# Patient Record
Sex: Male | Born: 1965 | Race: White | Hispanic: No | Smoking: Current some day smoker
Health system: Southern US, Community
[De-identification: ages and names within clinical notes are randomized; demographics above are authoritative.]

## PROBLEM LIST (undated history)

## (undated) DIAGNOSIS — T8859XA Other complications of anesthesia, initial encounter: Secondary | ICD-10-CM

## (undated) DIAGNOSIS — I672 Cerebral atherosclerosis: Secondary | ICD-10-CM

## (undated) DIAGNOSIS — R7303 Prediabetes: Secondary | ICD-10-CM

## (undated) DIAGNOSIS — I10 Essential (primary) hypertension: Secondary | ICD-10-CM

## (undated) DIAGNOSIS — I700 Atherosclerosis of aorta: Secondary | ICD-10-CM

## (undated) DIAGNOSIS — E785 Hyperlipidemia, unspecified: Secondary | ICD-10-CM

## (undated) DIAGNOSIS — F1010 Alcohol abuse, uncomplicated: Secondary | ICD-10-CM

## (undated) DIAGNOSIS — C801 Malignant (primary) neoplasm, unspecified: Secondary | ICD-10-CM

## (undated) DIAGNOSIS — I639 Cerebral infarction, unspecified: Secondary | ICD-10-CM

## (undated) DIAGNOSIS — Z8679 Personal history of other diseases of the circulatory system: Secondary | ICD-10-CM

## (undated) DIAGNOSIS — F419 Anxiety disorder, unspecified: Secondary | ICD-10-CM

## (undated) DIAGNOSIS — R0600 Dyspnea, unspecified: Secondary | ICD-10-CM

## (undated) HISTORY — DX: Malignant (primary) neoplasm, unspecified: C80.1

## (undated) HISTORY — PX: TONSILLECTOMY: SUR1361

---

## 2006-01-26 ENCOUNTER — Emergency Department: Payer: Self-pay | Admitting: General Practice

## 2014-11-03 ENCOUNTER — Emergency Department
Admission: EM | Admit: 2014-11-03 | Discharge: 2014-11-03 | Disposition: A | Discharge status: Home / Self Care | Payer: 59 | Attending: Student | Admitting: Student

## 2014-11-03 DIAGNOSIS — F10239 Alcohol dependence with withdrawal, unspecified: Secondary | ICD-10-CM

## 2014-11-03 DIAGNOSIS — Z79899 Other long term (current) drug therapy: Secondary | ICD-10-CM | POA: Insufficient documentation

## 2014-11-03 DIAGNOSIS — F10939 Alcohol use, unspecified with withdrawal, unspecified: Secondary | ICD-10-CM

## 2014-11-03 DIAGNOSIS — Z720 Tobacco use: Secondary | ICD-10-CM | POA: Insufficient documentation

## 2014-11-03 DIAGNOSIS — R112 Nausea with vomiting, unspecified: Secondary | ICD-10-CM | POA: Diagnosis not present

## 2014-11-03 DIAGNOSIS — I159 Secondary hypertension, unspecified: Secondary | ICD-10-CM | POA: Diagnosis not present

## 2014-11-03 DIAGNOSIS — I10 Essential (primary) hypertension: Secondary | ICD-10-CM | POA: Diagnosis present

## 2014-11-03 LAB — COMPREHENSIVE METABOLIC PANEL
ALK PHOS: 67 U/L (ref 38–126)
ALT: 18 U/L (ref 17–63)
ANION GAP: 9 (ref 5–15)
AST: 23 U/L (ref 15–41)
Albumin: 4.4 g/dL (ref 3.5–5.0)
BUN: 6 mg/dL (ref 6–20)
CO2: 25 mmol/L (ref 22–32)
Calcium: 9.4 mg/dL (ref 8.9–10.3)
Chloride: 100 mmol/L — ABNORMAL LOW (ref 101–111)
Creatinine, Ser: 0.73 mg/dL (ref 0.61–1.24)
Glucose, Bld: 158 mg/dL — ABNORMAL HIGH (ref 65–99)
Potassium: 4 mmol/L (ref 3.5–5.1)
Sodium: 134 mmol/L — ABNORMAL LOW (ref 135–145)
TOTAL PROTEIN: 7.3 g/dL (ref 6.5–8.1)
Total Bilirubin: 0.6 mg/dL (ref 0.3–1.2)

## 2014-11-03 LAB — CBC WITH DIFFERENTIAL/PLATELET
BASOS ABS: 0 10*3/uL (ref 0–0.1)
BASOS PCT: 0 %
EOS PCT: 0 %
Eosinophils Absolute: 0 10*3/uL (ref 0–0.7)
HEMATOCRIT: 46.6 % (ref 40.0–52.0)
Hemoglobin: 15.9 g/dL (ref 13.0–18.0)
Lymphocytes Relative: 11 %
Lymphs Abs: 1.2 10*3/uL (ref 1.0–3.6)
MCH: 30.6 pg (ref 26.0–34.0)
MCHC: 34.2 g/dL (ref 32.0–36.0)
MCV: 89.6 fL (ref 80.0–100.0)
MONO ABS: 0.7 10*3/uL (ref 0.2–1.0)
Monocytes Relative: 6 %
NEUTROS PCT: 83 %
Neutro Abs: 9.5 10*3/uL — ABNORMAL HIGH (ref 1.4–6.5)
Platelets: 304 10*3/uL (ref 150–440)
RBC: 5.19 MIL/uL (ref 4.40–5.90)
RDW: 13.1 % (ref 11.5–14.5)
WBC: 11.5 10*3/uL — ABNORMAL HIGH (ref 3.8–10.6)

## 2014-11-03 LAB — LIPASE, BLOOD: Lipase: 24 U/L (ref 22–51)

## 2014-11-03 LAB — ETHANOL

## 2014-11-03 MED ORDER — SODIUM CHLORIDE 0.9 % IV BOLUS (SEPSIS)
1000.0000 mL | Freq: Once | INTRAVENOUS | Status: AC
  Administered 2014-11-03: 1000 mL via INTRAVENOUS

## 2014-11-03 MED ORDER — CHLORDIAZEPOXIDE HCL 25 MG PO CAPS: 50.0000 mg | ORAL_CAPSULE | Freq: Four times a day (QID) | ORAL | Status: DC

## 2014-11-03 MED ORDER — LORAZEPAM 2 MG/ML IJ SOLN
1.0000 mg | Freq: Once | INTRAMUSCULAR | Status: AC
  Administered 2014-11-03: 1 mg via INTRAVENOUS
  Filled 2014-11-03: qty 1

## 2014-11-03 MED ORDER — LORAZEPAM 2 MG/ML IJ SOLN: 1.0000 mg | Freq: Once | INTRAMUSCULAR | Status: DC

## 2014-11-03 NOTE — ED Provider Notes (Signed)
[facility] Emergency Department Provider Note  ____________________________________________  Time seen: Approximately 10:49 AM  I have reviewed the triage vital signs and the nursing notes.   HISTORY  Chief Complaint Hypertension and Delirium Tremens (DTS)    HPI Henry Boone is a 49 y.o. male with history of alcoholism presents [facility], unknown onset. The patient reports that he typically drinks alcohol heavily, at minimum drinking 3 alcoholic beverages daily "for most of my adult life". Patient reports that 4 days ago, he did not feel well. He began having some vomiting. This lasted for 2 days and has since resolved. He took this as an opportunity to stop drinking alcohol completely. His last alcoholic drink was 4 days ago. He reports that he is no longer vomiting, he is not hallucinating but he does feel "off". He reports that he feels slightly "off" and "foggy" and he is unable to concentrate. Last night he reports he "put a can of dog food in the refrigerator". No diarrhea, fevers, chills, chest pain or difficulty breathing. He specifically denies any headache. Current severity of symptoms is moderate. No modifying factors. Denies history of seizures/severe withdrawal symptoms in the past.   History reviewed. No pertinent past medical history.  There are no active problems to display for this patient.   History reviewed. No pertinent past surgical history.  Current Outpatient Rx  Name  Route  Sig  Dispense  Refill  . chlordiazePOXIDE (LIBRIUM) 25 MG capsule   Oral   Take 2 capsules (50 mg total) by mouth 4 (four) times daily. Take 50 mg PO every 6 hours on day 1, 50 mg PO  Every 8 hours on day 2, 50 mg PO every 12 hours on day 3, 50 mg once on day 4   30 capsule   0     Allergies Review of patient's allergies indicates no known allergies.  No family history on file.  Social History History   Substance Use Topics  . Smoking status: Current Every Day Smoker  . Smokeless tobacco: Not on file  . Alcohol Use: Yes    Review of Systems Constitutional: No fever/chills Eyes: No visual changes. ENT: No sore throat. Cardiovascular: Denies chest pain. Respiratory: Denies shortness of breath. Gastrointestinal: No abdominal pain.  + nausea, + vomiting.  No diarrhea.  No constipation. Genitourinary: Negative for dysuria. Musculoskeletal: Negative for back pain. Skin: Negative for rash. Neurological: Negative for headaches, focal weakness or numbness.  10-point ROS otherwise negative.  ____________________________________________   PHYSICAL EXAM:  VITAL SIGNS: ED Triage Vitals  Enc Vitals Group     BP 11/03/14 1018 174/103 mmHg     Pulse Rate 11/03/14 1018 112     Resp 11/03/14 1018 18     Temp 11/03/14 1018 98.3 F (36.8 C)     Temp Source 11/03/14 1018 Oral     SpO2 11/03/14 1018 93 %     Weight 11/03/14 1018 170 lb (77.111 kg)     Height 11/03/14 1018 6' (1.829 m)     Head Cir --      Peak Flow --      Pain Score --      Pain Loc --      Pain Edu? --      Excl. in [place]? --     Constitutional: Alert and oriented. Mildy anxious-appearing but otherwise in no acute distress. Eyes: Conjunctivae are normal. PERRL. EOMI. Head: Atraumatic. Nose: No  congestion/rhinnorhea. Mouth/Throat: Mucous membranes are moist.  Oropharynx non-erythematous. Neck: No stridor.   Cardiovascular: tachycardic rate, regular rhythm. Grossly normal heart sounds.  Good peripheral circulation. Respiratory: Normal respiratory effort.  No retractions. Lungs CTAB. Gastrointestinal: Soft and nontender. No distention. No abdominal bruits. No CVA tenderness. Genitourinary: deferred Musculoskeletal: No lower extremity tenderness nor edema.  No joint effusions. Neurologic:  Normal speech and language. No gross focal neurologic deficits are appreciated. No gait instability. Skin:  Skin is warm, dry and  intact. No rash noted. Psychiatric: Mood and affect are normal. Speech and behavior are normal.  ____________________________________________   LABS (all labs ordered are listed, but only abnormal results are displayed)  Labs Reviewed  CBC WITH DIFFERENTIAL/PLATELET - Abnormal; Notable for the following:    WBC 11.5 (*)    Neutro Abs 9.5 (*)    All other components within normal limits  COMPREHENSIVE METABOLIC PANEL - Abnormal; Notable for the following:    Sodium 134 (*)    Chloride 100 (*)    Glucose, Bld 158 (*)    All other components within normal limits  LIPASE, BLOOD  ETHANOL   ____________________________________________  EKG  ED ECG REPORT I, Joanne Gavel, the attending physician, personally viewed and interpreted this ECG.   Date: 11/03/2014  EKG Time: 10:20  Rate: 117  Rhythm: sinus tachycardia  Axis: right  Intervals:none  ST&T Change: No acute ST segment elevation  ____________________________________________  RADIOLOGY  none ____________________________________________   PROCEDURES  Procedure(s) performed: None  Critical Care performed: No  ____________________________________________   INITIAL IMPRESSION / ASSESSMENT AND PLAN / ED COURSE  Pertinent labs & imaging results that were available during my care of the patient were reviewed by me and considered in my medical decision making (see chart for details).  Henry Boone is a 49 y.o. male with history of alcoholism presents [facility], unknown onset. On exam, he appears mildly anxious, he is mildly tachycardic but his exam is otherwise unremarkable. He is hypertensive to 174/103. He denies any chest pain, difficulty breathing, headache. He has an intact neurological examination. He reports that 4 days ago he developed vomiting however has not had any vomiting over the past 2-3 days. Suspect this might have been viral illness given that his vomiting  has resolved and he has no abdominal tenderness. Additionally, I suspect he is suffering from symptoms of alcohol withdrawal given his tachycardia, hypertension. I have offered behavioral health consult and  referral to [facility] for outpatient treatment however he reports that he is not interested in this, would like to be managed at home if possible. He is alert and oriented, denies any SI/HI/visual hallucinations. Plan for basic labs, treatment with Ativan, normal saline. Reassess for disposition.  ----------------------------------------- 1:20 PM on 11/03/2014 -----------------------------------------  Patient with improvement tachycardia. I reassessed him, he was reports he feels much better after Ativan. Current CIWA score is 1. Blood pressure has improved to 142/84. Will discharge with Librium taper. We discussed need for establishing care immediately with PCP doctor and he is comfortable with the discharge plan. We discussed meticulous return precautions and he has voiced understanding of them. At this point, he is nearly four-days sober and appears well. ____________________________________________   FINAL CLINICAL IMPRESSION(S) / ED DIAGNOSES  Final diagnoses:  Non-intractable vomiting with nausea, vomiting of unspecified type  Secondary hypertension, unspecified  Alcohol withdrawal, with unspecified complication      Joanne Gavel, MD 11/03/14 1323

## 2014-11-03 NOTE — ED Notes (Signed)
Pt sent here from [place] for elevated BP. Pt went to [facility] because he was feeling nauseated/vomiting on Saturday. Pt states that he decided to stop drinking on Saturday and has not drank anything since. Pt was told her may be going into alcohol withdrawal. Pt usually drinks 3 beer/wine daily. Pt c/o headache. No tremors at this time. Pt states that he does feel "foggy"

## 2014-11-11 ENCOUNTER — Emergency Department: Payer: 59

## 2014-11-11 ENCOUNTER — Encounter: Payer: Self-pay | Admitting: *Deleted

## 2014-11-11 ENCOUNTER — Inpatient Hospital Stay
Admission: EM | Admit: 2014-11-11 | Discharge: 2014-11-15 | DRG: 068 | Disposition: A | Discharge status: Home / Self Care | Payer: 59 | Attending: Internal Medicine | Admitting: Internal Medicine

## 2014-11-11 DIAGNOSIS — H532 Diplopia: Secondary | ICD-10-CM | POA: Diagnosis present

## 2014-11-11 DIAGNOSIS — G453 Amaurosis fugax: Secondary | ICD-10-CM

## 2014-11-11 DIAGNOSIS — R030 Elevated blood-pressure reading, without diagnosis of hypertension: Secondary | ICD-10-CM | POA: Diagnosis present

## 2014-11-11 DIAGNOSIS — I6312 Cerebral infarction due to embolism of basilar artery: Secondary | ICD-10-CM

## 2014-11-11 DIAGNOSIS — F1021 Alcohol dependence, in remission: Secondary | ICD-10-CM | POA: Diagnosis present

## 2014-11-11 DIAGNOSIS — H53451 Other localized visual field defect, right eye: Secondary | ICD-10-CM | POA: Diagnosis present

## 2014-11-11 DIAGNOSIS — G459 Transient cerebral ischemic attack, unspecified: Secondary | ICD-10-CM

## 2014-11-11 DIAGNOSIS — I668 Occlusion and stenosis of other cerebral arteries: Secondary | ICD-10-CM | POA: Diagnosis not present

## 2014-11-11 DIAGNOSIS — I6330 Cerebral infarction due to thrombosis of unspecified cerebral artery: Secondary | ICD-10-CM | POA: Insufficient documentation

## 2014-11-11 DIAGNOSIS — F17210 Nicotine dependence, cigarettes, uncomplicated: Secondary | ICD-10-CM | POA: Diagnosis present

## 2014-11-11 HISTORY — DX: Alcohol abuse, uncomplicated: F10.10

## 2014-11-11 LAB — CBC WITH DIFFERENTIAL/PLATELET
BASOS ABS: 0.1 10*3/uL (ref 0–0.1)
Basophils Relative: 1 %
EOS ABS: 0.2 10*3/uL (ref 0–0.7)
EOS PCT: 2 %
HCT: 48.2 % (ref 40.0–52.0)
HEMOGLOBIN: 16.2 g/dL (ref 13.0–18.0)
LYMPHS PCT: 18 %
Lymphs Abs: 2.2 10*3/uL (ref 1.0–3.6)
MCH: 30.5 pg (ref 26.0–34.0)
MCHC: 33.5 g/dL (ref 32.0–36.0)
MCV: 91.1 fL (ref 80.0–100.0)
Monocytes Absolute: 1 10*3/uL (ref 0.2–1.0)
Monocytes Relative: 8 %
NEUTROS ABS: 8.8 10*3/uL — AB (ref 1.4–6.5)
Neutrophils Relative %: 71 %
Platelets: 327 10*3/uL (ref 150–440)
RBC: 5.29 MIL/uL (ref 4.40–5.90)
RDW: 13.2 % (ref 11.5–14.5)
WBC: 12.2 10*3/uL — AB (ref 3.8–10.6)

## 2014-11-11 LAB — BASIC METABOLIC PANEL
Anion gap: 11 (ref 5–15)
BUN: 7 mg/dL (ref 6–20)
CO2: 26 mmol/L (ref 22–32)
Calcium: 9.6 mg/dL (ref 8.9–10.3)
Chloride: 96 mmol/L — ABNORMAL LOW (ref 101–111)
Creatinine, Ser: 0.78 mg/dL (ref 0.61–1.24)
GFR calc Af Amer: >60 mL/min (ref >60)
GFR calc non Af Amer: >60 mL/min (ref >60)
GLUCOSE: 116 mg/dL — AB (ref 65–99)
POTASSIUM: 3.8 mmol/L (ref 3.5–5.1)
Sodium: 133 mmol/L — ABNORMAL LOW (ref 135–145)

## 2014-11-11 LAB — TROPONIN I

## 2014-11-11 MED ORDER — IOHEXOL 350 MG/ML SOLN
100.0000 mL | Freq: Once | INTRAVENOUS | Status: AC | PRN
  Administered 2014-11-11: 100 mL via INTRAVENOUS

## 2014-11-11 MED ORDER — ASPIRIN EC 325 MG PO TBEC
325.0000 mg | DELAYED_RELEASE_TABLET | Freq: Every day | ORAL | Status: DC
  Administered 2014-11-12 – 2014-11-14 (×3): 325 mg via ORAL
  Filled 2014-11-11 (×3): qty 1

## 2014-11-11 MED ORDER — ADULT MULTIVITAMIN W/MINERALS CH
1.0000 | ORAL_TABLET | Freq: Every day | ORAL | Status: DC
  Administered 2014-11-11 – 2014-11-14 (×4): 1 via ORAL
  Filled 2014-11-11 (×4): qty 1

## 2014-11-11 MED ORDER — ASPIRIN 81 MG PO CHEW
CHEWABLE_TABLET | ORAL | Status: AC
  Administered 2014-11-11: 324 mg via ORAL
  Filled 2014-11-11: qty 4

## 2014-11-11 MED ORDER — THIAMINE HCL 100 MG/ML IJ SOLN
100.0000 mg | Freq: Every day | INTRAMUSCULAR | Status: DC
  Filled 2014-11-11: qty 2

## 2014-11-11 MED ORDER — ASPIRIN 81 MG PO CHEW
324.0000 mg | CHEWABLE_TABLET | Freq: Once | ORAL | Status: AC
  Administered 2014-11-11: 324 mg via ORAL

## 2014-11-11 MED ORDER — LORAZEPAM 1 MG PO TABS: 1.0000 mg | ORAL_TABLET | Freq: Four times a day (QID) | ORAL | Status: AC | PRN

## 2014-11-11 MED ORDER — LORAZEPAM 2 MG/ML IJ SOLN: 1.0000 mg | Freq: Four times a day (QID) | INTRAMUSCULAR | Status: AC | PRN

## 2014-11-11 MED ORDER — FOLIC ACID 1 MG PO TABS
1.0000 mg | ORAL_TABLET | Freq: Every day | ORAL | Status: DC
  Administered 2014-11-11 – 2014-11-14 (×4): 1 mg via ORAL
  Filled 2014-11-11 (×4): qty 1

## 2014-11-11 MED ORDER — ASPIRIN 325 MG PO TABS: 325.0000 mg | ORAL_TABLET | Freq: Once | ORAL | Status: DC

## 2014-11-11 MED ORDER — VITAMIN B-1 100 MG PO TABS
100.0000 mg | ORAL_TABLET | Freq: Every day | ORAL | Status: DC
  Administered 2014-11-12 – 2014-11-14 (×3): 100 mg via ORAL
  Filled 2014-11-11 (×3): qty 1

## 2014-11-11 NOTE — H&P (Signed)
Henry Boone is an 49 y.o. male.   Chief Complaint: Visual changes right eye HPI: Had episode last weak where he lost vision in right eye which lasted a few min. Associated with N/V and blurred vision. He has hx of EtOH and at that point stopped drinking. No EtoH for 12 days. He was seen in the interim for EtOH withdrawal symptoms and given a librium taper. Today he had another episode of the right eye and since has had two more episodes each lasting a couple of min. Currently no symptoms.  Past Medical History  Diagnosis Date  . ETOH abuse     History reviewed. No pertinent past surgical history.Hx of EtOH abuse,  Surgical Hx: Tonselectomy  No family history on file. DM Social History:  reports that he has been smoking.  He does not have any smokeless tobacco history on file. He reports that he does not drink alcohol. His drug history is not on file.  Allergies: No Known Allergies   (Not in a hospital admission)  Results for orders placed or performed during the hospital encounter of 11/11/14 (from the past 48 hour(s))  Basic metabolic panel     Status: Abnormal   Collection Time: 11/11/14  4:09 PM  Result Value Ref Range   Sodium 133 (L) 135 - 145 mmol/L   Potassium 3.8 3.5 - 5.1 mmol/L   Chloride 96 (L) 101 - 111 mmol/L   CO2 26 22 - 32 mmol/L   Glucose, Bld 116 (H) 65 - 99 mg/dL   BUN 7 6 - 20 mg/dL   Creatinine, Ser 0.78 0.61 - 1.24 mg/dL   Calcium 9.6 8.9 - 10.3 mg/dL   GFR calc non Af Amer >60 >60 mL/min   GFR calc Af Amer >60 >60 mL/min    Comment: (NOTE) The eGFR has been calculated using the CKD EPI equation. This calculation has not been validated in all clinical situations. eGFR's persistently <60 mL/min signify possible Chronic Kidney Disease.    Anion gap 11 5 - 15  Troponin I     Status: None   Collection Time: 11/11/14  4:09 PM  Result Value Ref Range   Troponin I <0.03 <0.031 ng/mL    Comment:        NO INDICATION OF MYOCARDIAL INJURY.   CBC with  Differential     Status: Abnormal   Collection Time: 11/11/14  4:09 PM  Result Value Ref Range   WBC 12.2 (H) 3.8 - 10.6 K/uL   RBC 5.29 4.40 - 5.90 MIL/uL   Hemoglobin 16.2 13.0 - 18.0 g/dL   HCT 48.2 40.0 - 52.0 %   MCV 91.1 80.0 - 100.0 fL   MCH 30.5 26.0 - 34.0 pg   MCHC 33.5 32.0 - 36.0 g/dL   RDW 13.2 11.5 - 14.5 %   Platelets 327 150 - 440 K/uL   Neutrophils Relative % 71 %   Neutro Abs 8.8 (H) 1.4 - 6.5 K/uL   Lymphocytes Relative 18 %   Lymphs Abs 2.2 1.0 - 3.6 K/uL   Monocytes Relative 8 %   Monocytes Absolute 1.0 0.2 - 1.0 K/uL   Eosinophils Relative 2 %   Eosinophils Absolute 0.2 0 - 0.7 K/uL   Basophils Relative 1 %   Basophils Absolute 0.1 0 - 0.1 K/uL   Ct Head Wo Contrast  11/11/2014   CLINICAL DATA:  Visual difficulties, recent ETOH withdrawal  EXAM: CT HEAD WITHOUT CONTRAST  TECHNIQUE: Contiguous axial images were obtained  from the base of the skull through the vertex without intravenous contrast.  COMPARISON:  None.  FINDINGS: The bony calvarium is intact. There is a geographic area of decreased attenuation high in the vertex in the right parietal lobe with a smaller area decreased attenuation identified slightly more inferior and posterior. These changes are consistent with prior ischemia or possibly posttraumatic encephalomalacia. No findings to suggest acute hemorrhage, acute infarction or space-occupying mass lesion are noted.  IMPRESSION: Chronic changes in the right parietal lobe as described.  No acute abnormality   Electronically Signed   By: Inez Catalina M.D.   On: 11/11/2014 16:20    Review of Systems  Constitutional: Negative for fever, chills and weight loss.  HENT: Negative for hearing loss.   Eyes: Positive for blurred vision and double vision.  Respiratory: Negative for cough and shortness of breath.   Cardiovascular: Negative for chest pain and leg swelling.  Gastrointestinal: Positive for nausea and vomiting. Negative for diarrhea.  Genitourinary:  Positive for dysuria. Negative for urgency.  Musculoskeletal: Negative for back pain and neck pain.  Skin: Negative for rash.  Neurological: Negative for dizziness, focal weakness, loss of consciousness, weakness and headaches.    Blood pressure 181/101, pulse 105, temperature 98.4 F (36.9 C), temperature source Oral, resp. rate 18, height 6' (1.829 m), weight 77.111 kg (170 lb), SpO2 98 %. Physical Exam  Constitutional: He is oriented to person, place, and time. He appears well-developed and well-nourished.  HENT:  Head: Normocephalic.  Mouth/Throat: Oropharynx is clear and moist. No oropharyngeal exudate.  Eyes: Pupils are equal, round, and reactive to light. No scleral icterus.  Fungi appear normal  Neck: No JVD present. No thyromegaly present.  Cardiovascular: Normal rate.   Respiratory: No respiratory distress. He exhibits no tenderness.  GI: Soft. He exhibits no mass. There is no tenderness.  Musculoskeletal: He exhibits no edema or tenderness.  Lymphadenopathy:    He has no cervical adenopathy.  Neurological: He is alert and oriented to person, place, and time. No cranial nerve deficit.  Skin: No rash noted.     Assessment/Plan 1. Amaurosis Fugax: No other neurologic symptoms. Question of if this is perepheral such as CRVO or central such as TIA symptoms. Nerology was phone consulted by ED physician who recommended MRI and CT angio and was not candidate for TPA. Pt placed on ASA for now.  2.HTN: With questionable stroke like symptoms would not acutely treat HTN at this time. Will likely need meds on discharge.  3. EtOH Abuse: No sign of withdrawal but will place on CIWA protocol.  Time spent = 45 min  Baxter Hire 11/11/2014, 6:51 PM

## 2014-11-11 NOTE — ED Notes (Signed)
Seen here last week for etoh withdraw, is still having episodes past 2 days of shadow over his right eye, had double vision , now feels fine

## 2014-11-11 NOTE — ED Notes (Signed)
Patient to desk stating he can not see out of his right eye at this point. When asked if vision was completely black, he stated "no, I just can't make anything out". Dr. Reita Cliche notified and in to see patient.

## 2014-11-11 NOTE — ED Notes (Signed)
Patient transported to CT 

## 2014-11-11 NOTE — ED Provider Notes (Signed)
[facility] Emergency Department Provider Note   ____________________________________________  Time seen: 3:45 PM I have reviewed the triage vital signs and the triage nursing note.  HISTORY  Chief Complaint Eye Problem   Historian Patient  HPI Henry Boone is a 49 y.o. male who is complaining about vision changes. The first episode occurred when he was vomiting Sunday the 14th which is a approximately 1 week ago. At that time he was retching and noticed a shadow that seemed like a dark horizontal line that came across from the eyelid pulling down like a shade and then became associated with blurry vision or double vision. It only lasted a few seconds. It scared him at that point in time and he had been drinking alcohol excessively and so he decided to quit drinking abruptly.By Wednesday he was feeling jittery, anxious, with increased blood pressure and was seen and diagnosed with alcohol withdrawal. Since one week ago at that time he has had no more symptoms related to alcohol withdrawal. However yesterday he did have an additional episode where his vision was double in the right eye and had a same shade/shadow feeling which lasted a few seconds. This morning he had another episode, followed this afternoon by another episode. No headache. No eye pain, except for one time today that he did feel a little bit of cramping in his right eye.    Past Medical History  Diagnosis Date  . ETOH abuse     There are no active problems to display for this patient.   History reviewed. No pertinent past surgical history.  Current Outpatient Rx  Name  Route  Sig  Dispense  Refill  . chlordiazePOXIDE (LIBRIUM) 25 MG capsule   Oral   Take 2 capsules (50 mg total) by mouth 4 (four) times daily. Take 50 mg PO every 6 hours on day 1, 50 mg PO  Every 8 hours on day 2, 50 mg PO every 12 hours on day 3, 50 mg once on day 4   30 capsule   0     Allergies Review of patient's  allergies indicates no known allergies.  No family history on file.  Social History History  Substance Use Topics  . Smoking status: Current Every Day Smoker  . Smokeless tobacco: Not on file  . Alcohol Use: No    Review of Systems  Constitutional: Negative for fever. Eyes: Negative for visual changes. ENT: Negative for sore throat. Cardiovascular: Some left neck discomfort and left arm discomfort during one or 2 of the episode episodes with vision. Respiratory: Negative for shortness of breath. Gastrointestinal: Negative for abdominal pain, vomiting and diarrhea. Genitourinary: Negative for dysuria. Musculoskeletal: Negative for back pain. Skin: Negative for rash. Neurological: Negative for headaches, focal weakness or numbness. 10 point Review of Systems otherwise negative ____________________________________________   PHYSICAL EXAM:  VITAL SIGNS: ED Triage Vitals  Enc Vitals Group     BP 11/11/14 1321 151/95 mmHg     Pulse Rate 11/11/14 1321 109     Resp 11/11/14 1321 24     Temp 11/11/14 1320 98.4 F (36.9 C)     Temp Source 11/11/14 1320 Oral     SpO2 11/11/14 1500 98 %     Weight 11/11/14 1320 170 lb (77.111 kg)     Height 11/11/14 1320 6' (1.829 m)     Head Cir --      Peak Flow --      Pain Score --  Pain Loc --      Pain Edu? --      Excl. in [place]? --      Constitutional: Alert and oriented. Well appearing and in no distress. Eyes: Conjunctivae are normal. PERRL. funduscopic exam limited by small pupils, however no abnormality seen. Retina appears normal. Normal vasculature. Normal extraocular movements. ENT   Head: Normocephalic and atraumatic.   Nose: No congestion/rhinnorhea.   Mouth/Throat: Mucous membranes are moist.   Neck: No stridor. Cardiovascular/Chest: Normal rate, regular rhythm.  No murmurs, rubs, or gallops. Respiratory: Normal respiratory effort without tachypnea nor retractions. Breath sounds are clear and equal  bilaterally. No wheezes/rales/rhonchi. Gastrointestinal: Soft. No distention, no guarding, no rebound. Nontender   Genitourinary/rectal:Deferred Musculoskeletal: Nontender with normal range of motion in all extremities. No joint effusions.  No lower extremity tenderness nor edema. Neurologic:  Normal speech and language. No gross or focal neurologic deficits are appreciated. Skin:  Skin is warm, dry and intact. No rash noted. Psychiatric: Mood and affect are normal. Speech and behavior are normal. Patient exhibits appropriate insight and judgment.  ____________________________________________   EKG I, Lisa Roca, MD, the attending physician have personally viewed and interpreted all ECGs.  87 bpm. Normal sinus rhythm. Narrow QRS. Normal axis. Normal ST and T-wave. ____________________________________________  LABS (pertinent positives/negatives)  Metabolic panel significant for sodium 133, chloride 96 Troponin less than 0.03 CBC showing a white blood cell count 12.2, hemoglobin 16.2 and platelet count 327  ____________________________________________  RADIOLOGY All Xrays were viewed by me. Imaging interpreted by Radiologist.  CT head noncontrast: Chronic changes in the right parietal lobe with no acute abnormality   __________________________________________  PROCEDURES  Procedure(s) performed: None Critical Care performed: CRITICAL CARE Performed by: Lisa Roca   Total critical care time: 30 minutes  Critical care time was exclusive of separately billable procedures and treating other patients.  Critical care was necessary to treat or prevent imminent or life-threatening deterioration.  Critical care was time spent personally by me on the following activities: development of treatment plan with patient and/or surrogate as well as nursing, discussions with consultants, evaluation of patient's response to treatment, examination of patient, obtaining history from  patient or surrogate, ordering and performing treatments and interventions, ordering and review of laboratory studies, ordering and review of radiographic studies, pulse oximetry and re-evaluation of patient's condition.   ____________________________________________   ED COURSE / ASSESSMENT AND PLAN  CONSULTATIONS: Phone consultation with ophthalmologist Dr.Mukherjee. Phone consultation with hospitalist for admission Dr. Burney Gauze. Phone consultation with neurologist Dr. Tamala Julian.  Pertinent labs & imaging results that were available during my care of the patient were reviewed by me and considered in my medical decision making (see chart for details).  Essentially the patient has had no pain with these intermittent but increasing frequency of focal right eye vision changes. Workup initiated with regard to possible TIA. CT head negative for acute finding, and labs reassuring. I discussed the case with the ophthalmologist who is willing to see him in follow-up tomorrow if the patient's discharged.  I'm concerned about TIAs stroke etiology, and the patient is not follow primary care physician in [facility] Friday, as I'm worried that he will not get expedited evaluation and he needs in-hospital management. I discussed with the hospitalist for admission.  About 545 I went to update the patient and he was complaining that he had again onset of the dark vision in the right eye. I consulted the neurologist Dr. Tamala Julian who was in agreement with the patient  is not a TPA candidate for the deficit of one eye vision deficit but recommended MRI, and CT angiogram of the head and neck. He also recommended full dose aspirin now, and patient had just received this.  Patient / Family / Caregiver informed of clinical course, medical decision-making process, and agree with plan.   I discussed return precautions, follow-up instructions, and discharged instructions with patient and/or  family.  ___________________________________________   FINAL CLINICAL IMPRESSION(S) / ED DIAGNOSES   Final diagnoses:  Amaurosis fugax of right eye       Lisa Roca, MD 11/11/14 [phone]

## 2014-11-12 ENCOUNTER — Inpatient Hospital Stay (HOSPITAL_COMMUNITY)
Admit: 2014-11-12 | Discharge: 2014-11-12 | Disposition: A | Discharge status: Home / Self Care | Payer: 59 | Attending: Internal Medicine | Admitting: Internal Medicine

## 2014-11-12 DIAGNOSIS — I6312 Cerebral infarction due to embolism of basilar artery: Secondary | ICD-10-CM | POA: Diagnosis not present

## 2014-11-12 DIAGNOSIS — H53451 Other localized visual field defect, right eye: Secondary | ICD-10-CM | POA: Diagnosis present

## 2014-11-12 DIAGNOSIS — G459 Transient cerebral ischemic attack, unspecified: Secondary | ICD-10-CM

## 2014-11-12 DIAGNOSIS — H532 Diplopia: Secondary | ICD-10-CM | POA: Diagnosis present

## 2014-11-12 DIAGNOSIS — I6330 Cerebral infarction due to thrombosis of unspecified cerebral artery: Secondary | ICD-10-CM | POA: Insufficient documentation

## 2014-11-12 DIAGNOSIS — F1021 Alcohol dependence, in remission: Secondary | ICD-10-CM | POA: Diagnosis present

## 2014-11-12 DIAGNOSIS — I668 Occlusion and stenosis of other cerebral arteries: Secondary | ICD-10-CM | POA: Diagnosis present

## 2014-11-12 DIAGNOSIS — R030 Elevated blood-pressure reading, without diagnosis of hypertension: Secondary | ICD-10-CM | POA: Diagnosis present

## 2014-11-12 DIAGNOSIS — F17210 Nicotine dependence, cigarettes, uncomplicated: Secondary | ICD-10-CM | POA: Diagnosis present

## 2014-11-12 LAB — SEDIMENTATION RATE: SED RATE: 3 mm/h (ref 0–15)

## 2014-11-12 NOTE — Progress Notes (Signed)
*  PRELIMINARY RESULTS* Echocardiogram 2D Echocardiogram has been performed.  Henry Boone 11/12/2014, 1:11 PM

## 2014-11-12 NOTE — Plan of Care (Addendum)
Problem: Discharge/Transitional Outcomes Goal: Other Discharge Outcomes/Goals Outcome: Progressing Plan of care progress to goal: No c/o pain. Scoring 0-1 on NIH scale. No acute changes. MD aware of vision changes. Neuro consult ordered.Tolerating diet.

## 2014-11-12 NOTE — Progress Notes (Signed)
[facility] NAME: Henry Boone    MR#:  [id number]  DATE OF BIRTH:  August 07, 1965  SUBJECTIVE: Seen today, 49 year old male with no past medical history comes in with intermittent  loss of vision in the right eye , going on for a week. Found to have a left occipital stroke on the MRI of the brain. Patient has normal vision at this time and didn't have any further episodes of vision loss  since admission. No weakness in hands or legs. No slurred speech. No numbness .  CHIEF COMPLAINT:   Chief Complaint  Patient presents with  . Eye Problem    REVIEW OF SYSTEMS:    Review of Systems  Constitutional: Negative for fever and chills.  HENT: Negative for hearing loss.   Eyes: Negative for blurred vision, double vision and photophobia.  Respiratory: Negative for cough, hemoptysis and shortness of breath.   Cardiovascular: Negative for palpitations, orthopnea and leg swelling.  Gastrointestinal: Negative for vomiting, abdominal pain and diarrhea.  Genitourinary: Negative for dysuria and urgency.  Musculoskeletal: Negative for myalgias and neck pain.  Skin: Negative for rash.  Neurological: Negative for dizziness, focal weakness, seizures, weakness and headaches.  Psychiatric/Behavioral: Negative for memory loss. The patient does not have insomnia.     Nutrition: Tolerating Diet: Tolerating PT:      DRUG ALLERGIES:  No Known Allergies  VITALS:  Blood pressure 133/88, pulse 90, temperature 98.2 F (36.8 C), temperature source Oral, resp. rate 19, height 6' (1.829 m), weight 75.342 kg (166 lb 1.6 oz), SpO2 99 %.  PHYSICAL EXAMINATION:   Physical Exam  GENERAL:  49 y.o.-year-old patient lying in the bed with no acute distress.  EYES: Pupils equal, round, reactive to light and accommodation. No scleral icterus. Extraocular muscles intact.  HEENT: Head atraumatic, normocephalic. Oropharynx and nasopharynx clear.  NECK:   Supple, no jugular venous distention. No thyroid enlargement, no tenderness.  LUNGS: Normal breath sounds bilaterally, no wheezing, rales,rhonchi or crepitation. No use of accessory muscles of respiration.  CARDIOVASCULAR: S1, S2 normal. No murmurs, rubs, or gallops.  ABDOMEN: Soft, nontender, nondistended. Bowel sounds present. No organomegaly or mass.  EXTREMITIES: No pedal edema, cyanosis, or clubbing.  NEUROLOGIC: Cranial nerves II through XII are intact. Muscle strength 5/5 in all extremities. Sensation intact. Gait not checked.  PSYCHIATRIC: The patient is alert and oriented x 3.  SKIN: No obvious rash, lesion, or ulcer.    LABORATORY PANEL:   CBC  Recent Labs Lab 11/11/14 1609  WBC 12.2*  HGB 16.2  HCT 48.2  PLT 327   ------------------------------------------------------------------------------------------------------------------  Chemistries   Recent Labs Lab 11/11/14 1609  NA 133*  K 3.8  CL 96*  CO2 26  GLUCOSE 116*  BUN 7  CREATININE 0.78  CALCIUM 9.6   ------------------------------------------------------------------------------------------------------------------  Cardiac Enzymes  Recent Labs Lab 11/11/14 1609  TROPONINI <0.03   ------------------------------------------------------------------------------------------------------------------  RADIOLOGY:  Ct Angio Head W/cm &/or Wo Cm  11/11/2014   CLINICAL DATA:  TIA. Multiple episodes of visual disturbance in the past 2 weeks.  EXAM: CT ANGIOGRAPHY HEAD AND NECK  TECHNIQUE: Multidetector CT imaging of the head and neck was performed using the standard protocol during bolus administration of intravenous contrast. Multiplanar CT image reconstructions and MIPs were obtained to evaluate the vascular anatomy. Carotid stenosis measurements (when applicable) are obtained utilizing NASCET criteria, using the distal internal carotid diameter as the denominator.  CONTRAST:  143mL OMNIPAQUE IOHEXOL 350 MG/ML  SOLN  COMPARISON:  Head CT earlier today  FINDINGS: CT HEAD  Brain: Cortical and subcortical white matter hypoattenuation in the right occipital and right parietal lobes as seen on noncontrast head CT earlier today and suggestive of potentially subacute infarcts. Small foci of hypoattenuation in the left centrum semiovale. Ventricles are normal. No mass, midline shift, or extra-axial fluid collection.  Calvarium and skull base: No fracture.  Paranasal sinuses: Moderate, polypoid mucosal thickening inferiorly in the right maxillary sinus.  Orbits: Unremarkable.  CTA NECK  Aortic arch: Common origin of the brachiocephalic and left common carotid arteries is noted, a normal variant. The brachiocephalic and subclavian arteries are widely patent. Mild calcified plaque is noted in the right subclavian artery.  Right carotid system: Mild calcified and noncalcified plaque in the common and proximal internal carotid arteries without stenosis.  Left carotid system: Mild calcified and noncalcified atheromatous plaque throughout the common and cervical internal carotid arteries without stenosis.  Vertebral arteries:Vertebral arteries are patent and somewhat small in caliber bilaterally. There is focal atherosclerotic calcification at the right vertebral artery origin with no more than mild stenosis.  Skeleton: Mild mid to lower cervical spondylosis. Partial fusion of the post for lateral right first and second ribs.  Other neck: Paraseptal greater than centrilobular emphysema in the lung apices.  CTA HEAD  Anterior circulation: Internal carotid arteries are patent from skullbase to carotid termini with moderate carotid siphon calcification bilaterally. There is moderate cavernous carotid stenosis bilaterally. The right A1 segment is hypoplastic. Anterior communicating artery is patent. No significant ACA stenosis is seen.  M1 segments are patent without stenosis. Major MCA branch vessels appear patent without evidence of  significant proximal stenosis. There may be a decreased number of more distal, small right MCA branch vessels corresponding to the areas of infarction. No intracranial aneurysm is identified.  Posterior circulation: Intracranial vertebral arteries are small in caliber bilaterally, with the left vertebral artery being severely hypoplastic distally and functionally ending in PICA. PICA origins are patent bilaterally. 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 There is a fetal origin of the right PCA. No significant PCA stenosis is seen.  Venous sinuses: Patent.  Anatomic variants: Persistent left trigeminal artery. Fetal origin of the right PCA. Hypoplastic right A1.  Delayed phase: Mild gyral enhancement associated with the right parieto-occipital infarcts.  IMPRESSION: 1. Variant intracranial arterial anatomy without large vessel occlusion. 2. 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 3. Likely subacute right parieto-occipital infarcts. 4. Mild cervical carotid artery atherosclerosis without stenosis.   Electronically Signed   By: Logan Bores   On: 11/11/2014 19:52   Ct Head Wo Contrast  11/11/2014   CLINICAL DATA:  Visual difficulties, recent ETOH withdrawal  EXAM: CT HEAD WITHOUT CONTRAST  TECHNIQUE: Contiguous axial images were obtained from the base of the skull through the vertex without intravenous contrast.  COMPARISON:  None.  FINDINGS: The bony calvarium is intact. There is a geographic area of decreased attenuation high in the vertex in the right parietal lobe with a smaller area decreased attenuation identified slightly more inferior and posterior. These changes are consistent with prior ischemia or possibly posttraumatic encephalomalacia. No findings to suggest acute hemorrhage, acute infarction or space-occupying mass lesion are noted.  IMPRESSION: Chronic changes in the right  parietal lobe as described.  No acute abnormality   Electronically Signed   By: Inez Catalina M.D.   On: 11/11/2014 16:20  Ct Angio Neck W/cm &/or Wo/cm  11/11/2014   CLINICAL DATA:  TIA. Multiple episodes of visual disturbance in the past 2 weeks.  EXAM: CT ANGIOGRAPHY HEAD AND NECK  TECHNIQUE: Multidetector CT imaging of the head and neck was performed using the standard protocol during bolus administration of intravenous contrast. Multiplanar CT image reconstructions and MIPs were obtained to evaluate the vascular anatomy. Carotid stenosis measurements (when applicable) are obtained utilizing NASCET criteria, using the distal internal carotid diameter as the denominator.  CONTRAST:  160mL OMNIPAQUE IOHEXOL 350 MG/ML SOLN  COMPARISON:  Head CT earlier today  FINDINGS: CT HEAD  Brain: Cortical and subcortical white matter hypoattenuation in the right occipital and right parietal lobes as seen on noncontrast head CT earlier today and suggestive of potentially subacute infarcts. Small foci of hypoattenuation in the left centrum semiovale. Ventricles are normal. No mass, midline shift, or extra-axial fluid collection.  Calvarium and skull base: No fracture.  Paranasal sinuses: Moderate, polypoid mucosal thickening inferiorly in the right maxillary sinus.  Orbits: Unremarkable.  CTA NECK  Aortic arch: Common origin of the brachiocephalic and left common carotid arteries is noted, a normal variant. The brachiocephalic and subclavian arteries are widely patent. Mild calcified plaque is noted in the right subclavian artery.  Right carotid system: Mild calcified and noncalcified plaque in the common and proximal internal carotid arteries without stenosis.  Left carotid system: Mild calcified and noncalcified atheromatous plaque throughout the common and cervical internal carotid arteries without stenosis.  Vertebral arteries:Vertebral arteries are patent and somewhat small in caliber bilaterally. There is focal  atherosclerotic calcification at the right vertebral artery origin with no more than mild stenosis.  Skeleton: Mild mid to lower cervical spondylosis. Partial fusion of the post for lateral right first and second ribs.  Other neck: Paraseptal greater than centrilobular emphysema in the lung apices.  CTA HEAD  Anterior circulation: Internal carotid arteries are patent from skullbase to carotid termini with moderate carotid siphon calcification bilaterally. There is moderate cavernous carotid stenosis bilaterally. The right A1 segment is hypoplastic. Anterior communicating artery is patent. No significant ACA stenosis is seen.  M1 segments are patent without stenosis. Major MCA branch vessels appear patent without evidence of significant proximal stenosis. There may be a decreased number of more distal, small right MCA branch vessels corresponding to the areas of infarction. No intracranial aneurysm is identified.  Posterior circulation: Intracranial vertebral arteries are small in caliber bilaterally, with the left vertebral artery being severely hypoplastic distally and functionally ending in PICA. PICA origins are patent bilaterally. 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 There is a fetal origin of the right PCA. No significant PCA stenosis is seen.  Venous sinuses: Patent.  Anatomic variants: Persistent left trigeminal artery. Fetal origin of the right PCA. Hypoplastic right A1.  Delayed phase: Mild gyral enhancement associated with the right parieto-occipital infarcts.  IMPRESSION: 1. Variant intracranial arterial anatomy without large vessel occlusion. 2. 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 3. Likely subacute right parieto-occipital infarcts. 4. Mild cervical carotid artery atherosclerosis without stenosis.   Electronically Signed   By: Logan Bores   On: 11/11/2014 19:52   Mr Brain Wo  Contrast (neuro Protocol)  11/11/2014   CLINICAL DATA:  Multiple episodes of visual disturbance over the past 2 weeks.  EXAM: MRI HEAD WITHOUT CONTRAST  TECHNIQUE: Multiplanar, multiecho pulse sequences of the brain and surrounding structures were obtained without intravenous contrast.  COMPARISON:  Head CT/ CTA earlier today  FINDINGS: There is a cortical and subcortical infarction in the right parietal lobe which demonstrates cytotoxic edema and mild diffusion weighted signal abnormality which largely appears facilitated, likely reflecting a subacute infarct. There is some associated encephalomalacia in this region as well as gyral intrinsic T1 hyperintensity which may reflect mineralization or a small amount of petechial blood products and may indicate a degree of chronic ischemia in this region as well. Additionally, there is a small focus of cortical restricted diffusion along the superolateral aspect of this infarct which may reflect a small amount of superimposed acute ischemia.  More inferiorly and medially in the right parietal and right occipital lobes are areas of cortical and subcortical diffusion abnormality compatible with acute to early subacute ischemia, the largest measuring 2.9 cm in the occipital lobe and appearing relatively acute. There are also small foci of cortical diffusion abnormality in the left parietal, left occipital, and posterior left frontal lobes compatible with acute to subacute ischemia.  There is a small amount of petechial hemorrhage associated with the acute right occipital infarct. No frank parenchymal hematoma is identified. Ventricles are normal. There is no mass, midline shift, or extra-axial fluid collection. Several patchy foci of T2 hyperintensity oriented anterior to posterior in the left centrum semiovale are suggestive of chronic ischemia in a deep watershed pattern.  Orbits are unremarkable. Right maxillary sinus mucosal thickening is noted. Mastoid air cells are  clear. Major intracranial vascular flow voids are preserved, with variant intracranial arterial anatomy more fully evaluated on recent CTA.  IMPRESSION: Multiple infarcts of varying ages involving the posterior right greater than left cerebral hemispheres, with the largest acute infarct being in the right occipital lobe.   Electronically Signed   By: Logan Bores   On: 11/11/2014 20:08     ASSESSMENT AND PLAN:   Active Problems:   Amaurosis fugax   Cerebral thrombosis with cerebral infarction  #1 right parietal occipital infarct causing the intermittent built vision loss. Continue aspirin, obtain neurology consult, check fasting lipids, carotid ultrasound and echocardiograms ordered we will follow the results #2 multiple infarcts bilaterally: Monitored on telemetry patient is in sinus rhythm, appreciate neurology consult to see if he should  be on Coumadin   3 history of EtOH abuse patient is trying to quit..      All the records are reviewed and case discussed with Care Management/Social Workerr. Management plans discussed with the patient, family and they are in agreement.  CODE STATUS:  full   TOTAL TIME TAKING CARE OF THIS PATIENT: 35 min  POSSIBLE D/C IN DAYS, DEPENDING ON CLINICAL CONDITION.   Epifanio Lesches M.D on 11/12/2014 at 10:30 AM  Between 7am to 6pm - Pager - [phone]  After 6pm go to [email or web address] - password [email or web address]  [facility]  Office  [phone]  CC: Primary care physician; No PCP Per Patient

## 2014-11-12 NOTE — Plan of Care (Signed)
Problem: Discharge/Transitional Outcomes Goal: Other Discharge Outcomes/Goals Outcome: Progressing Plan of care progress to goal 1. VSS. B/p slightly elevated upon arrival from the ED but now improved. 2. Pt independent with ADL's, up ad lib. 3. Goal met- pt tolerating diet. Pt on CIWA q 6 hrs. Pt denies ETOH x 13 days now. Neuro checks q2 have been wnl . Vision loss to right eye has improved back to patient baseline.

## 2014-11-12 NOTE — Consult Note (Signed)
Reason for Consult: vision loss Referring Physician: Dr. Margarito Courser Henry Boone is an 49 y.o. male.  HPI: seen at request of Dr. Vianne Bulls for vision changes.  Pt reports that one week ago he was discharged for EtOH withdrawal and he noted some diplopia and blurry vision.  This went away then came back twice yesterday.  One episode with some L neck and face numbness.  Pt denies loss of vision but he does feel like his vision is now worse on the R eye.  He describes a shade coming up and not down.  No headaches or weakness described.  Last seen by doctor in 2007 prior to EtOH cessation two weeks ago.  Past Medical History  Diagnosis Date  . ETOH abuse     History reviewed. No pertinent past surgical history.  No family history on file.  Social History:  reports that he has been smoking.  He does not have any smokeless tobacco history on file. He reports that he does not drink alcohol. His drug history is not on file.  Allergies: No Known Allergies  Medications: none  Results for orders placed or performed during the hospital encounter of 11/11/14 (from the past 48 hour(s))  Basic metabolic panel     Status: Abnormal   Collection Time: 11/11/14  4:09 PM  Result Value Ref Range   Sodium 133 (L) 135 - 145 mmol/L   Potassium 3.8 3.5 - 5.1 mmol/L   Chloride 96 (L) 101 - 111 mmol/L   CO2 26 22 - 32 mmol/L   Glucose, Bld 116 (H) 65 - 99 mg/dL   BUN 7 6 - 20 mg/dL   Creatinine, Ser 0.78 0.61 - 1.24 mg/dL   Calcium 9.6 8.9 - 10.3 mg/dL   GFR calc non Af Amer >60 >60 mL/min   GFR calc Af Amer >60 >60 mL/min    Comment: (NOTE) The eGFR has been calculated using the CKD EPI equation. This calculation has not been validated in all clinical situations. eGFR's persistently <60 mL/min signify possible Chronic Kidney Disease.    Anion gap 11 5 - 15  Troponin I     Status: None   Collection Time: 11/11/14  4:09 PM  Result Value Ref Range   Troponin I <0.03 <0.031 ng/mL    Comment:        NO  INDICATION OF MYOCARDIAL INJURY.   CBC with Differential     Status: Abnormal   Collection Time: 11/11/14  4:09 PM  Result Value Ref Range   WBC 12.2 (H) 3.8 - 10.6 K/uL   RBC 5.29 4.40 - 5.90 MIL/uL   Hemoglobin 16.2 13.0 - 18.0 g/dL   HCT 48.2 40.0 - 52.0 %   MCV 91.1 80.0 - 100.0 fL   MCH 30.5 26.0 - 34.0 pg   MCHC 33.5 32.0 - 36.0 g/dL   RDW 13.2 11.5 - 14.5 %   Platelets 327 150 - 440 K/uL   Neutrophils Relative % 71 %   Neutro Abs 8.8 (H) 1.4 - 6.5 K/uL   Lymphocytes Relative 18 %   Lymphs Abs 2.2 1.0 - 3.6 K/uL   Monocytes Relative 8 %   Monocytes Absolute 1.0 0.2 - 1.0 K/uL   Eosinophils Relative 2 %   Eosinophils Absolute 0.2 0 - 0.7 K/uL   Basophils Relative 1 %   Basophils Absolute 0.1 0 - 0.1 K/uL    Ct Angio Head W/cm &/or Wo Cm  11/11/2014   CLINICAL DATA:  TIA.  Multiple episodes of visual disturbance in the past 2 weeks.  EXAM: CT ANGIOGRAPHY HEAD AND NECK  TECHNIQUE: Multidetector CT imaging of the head and neck was performed using the standard protocol during bolus administration of intravenous contrast. Multiplanar CT image reconstructions and MIPs were obtained to evaluate the vascular anatomy. Carotid stenosis measurements (when applicable) are obtained utilizing NASCET criteria, using the distal internal carotid diameter as the denominator.  CONTRAST:  141m OMNIPAQUE IOHEXOL 350 MG/ML SOLN  COMPARISON:  Head CT earlier today  FINDINGS: CT HEAD  Brain: Cortical and subcortical white matter hypoattenuation in the right occipital and right parietal lobes as seen on noncontrast head CT earlier today and suggestive of potentially subacute infarcts. Small foci of hypoattenuation in the left centrum semiovale. Ventricles are normal. No mass, midline shift, or extra-axial fluid collection.  Calvarium and skull base: No fracture.  Paranasal sinuses: Moderate, polypoid mucosal thickening inferiorly in the right maxillary sinus.  Orbits: Unremarkable.  CTA NECK  Aortic arch:  Common origin of the brachiocephalic and left common carotid arteries is noted, a normal variant. The brachiocephalic and subclavian arteries are widely patent. Mild calcified plaque is noted in the right subclavian artery.  Right carotid system: Mild calcified and noncalcified plaque in the common and proximal internal carotid arteries without stenosis.  Left carotid system: Mild calcified and noncalcified atheromatous plaque throughout the common and cervical internal carotid arteries without stenosis.  Vertebral arteries:Vertebral arteries are patent and somewhat small in caliber bilaterally. There is focal atherosclerotic calcification at the right vertebral artery origin with no more than mild stenosis.  Skeleton: Mild mid to lower cervical spondylosis. Partial fusion of the post for lateral right first and second ribs.  Other neck: Paraseptal greater than centrilobular emphysema in the lung apices.  CTA HEAD  Anterior circulation: Internal carotid arteries are patent from skullbase to carotid termini with moderate carotid siphon calcification bilaterally. There is moderate cavernous carotid stenosis bilaterally. The right A1 segment is hypoplastic. Anterior communicating artery is patent. No significant ACA stenosis is seen.  M1 segments are patent without stenosis. Major MCA branch vessels appear patent without evidence of significant proximal stenosis. There may be a decreased number of more distal, small right MCA branch vessels corresponding to the areas of infarction. No intracranial aneurysm is identified.  Posterior circulation: Intracranial vertebral arteries are small in caliber bilaterally, with the left vertebral artery being severely hypoplastic distally and functionally ending in PICA. PICA origins are patent bilaterally. 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 There is a fetal origin of the right PCA. No  significant PCA stenosis is seen.  Venous sinuses: Patent.  Anatomic variants: Persistent left trigeminal artery. Fetal origin of the right PCA. Hypoplastic right A1.  Delayed phase: Mild gyral enhancement associated with the right parieto-occipital infarcts.  IMPRESSION: 1. Variant intracranial arterial anatomy without large vessel occlusion. 2. 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 3. Likely subacute right parieto-occipital infarcts. 4. Mild cervical carotid artery atherosclerosis without stenosis.   Electronically Signed   By: ALogan Bores  On: 11/11/2014 19:52   Ct Head Wo Contrast  11/11/2014   CLINICAL DATA:  Visual difficulties, recent ETOH withdrawal  EXAM: CT HEAD WITHOUT CONTRAST  TECHNIQUE: Contiguous axial images were obtained from the base of the skull through the vertex without intravenous contrast.  COMPARISON:  None.  FINDINGS: The bony calvarium is intact. There is a geographic area of decreased attenuation  high in the vertex in the right parietal lobe with a smaller area decreased attenuation identified slightly more inferior and posterior. These changes are consistent with prior ischemia or possibly posttraumatic encephalomalacia. No findings to suggest acute hemorrhage, acute infarction or space-occupying mass lesion are noted.  IMPRESSION: Chronic changes in the right parietal lobe as described.  No acute abnormality   Electronically Signed   By: Inez Catalina M.D.   On: 11/11/2014 16:20   Ct Angio Neck W/cm &/or Wo/cm  11/11/2014   CLINICAL DATA:  TIA. Multiple episodes of visual disturbance in the past 2 weeks.  EXAM: CT ANGIOGRAPHY HEAD AND NECK  TECHNIQUE: Multidetector CT imaging of the head and neck was performed using the standard protocol during bolus administration of intravenous contrast. Multiplanar CT image reconstructions and MIPs were obtained to evaluate the vascular anatomy. Carotid stenosis measurements (when applicable) are obtained  utilizing NASCET criteria, using the distal internal carotid diameter as the denominator.  CONTRAST:  122m OMNIPAQUE IOHEXOL 350 MG/ML SOLN  COMPARISON:  Head CT earlier today  FINDINGS: CT HEAD  Brain: Cortical and subcortical white matter hypoattenuation in the right occipital and right parietal lobes as seen on noncontrast head CT earlier today and suggestive of potentially subacute infarcts. Small foci of hypoattenuation in the left centrum semiovale. Ventricles are normal. No mass, midline shift, or extra-axial fluid collection.  Calvarium and skull base: No fracture.  Paranasal sinuses: Moderate, polypoid mucosal thickening inferiorly in the right maxillary sinus.  Orbits: Unremarkable.  CTA NECK  Aortic arch: Common origin of the brachiocephalic and left common carotid arteries is noted, a normal variant. The brachiocephalic and subclavian arteries are widely patent. Mild calcified plaque is noted in the right subclavian artery.  Right carotid system: Mild calcified and noncalcified plaque in the common and proximal internal carotid arteries without stenosis.  Left carotid system: Mild calcified and noncalcified atheromatous plaque throughout the common and cervical internal carotid arteries without stenosis.  Vertebral arteries:Vertebral arteries are patent and somewhat small in caliber bilaterally. There is focal atherosclerotic calcification at the right vertebral artery origin with no more than mild stenosis.  Skeleton: Mild mid to lower cervical spondylosis. Partial fusion of the post for lateral right first and second ribs.  Other neck: Paraseptal greater than centrilobular emphysema in the lung apices.  CTA HEAD  Anterior circulation: Internal carotid arteries are patent from skullbase to carotid termini with moderate carotid siphon calcification bilaterally. There is moderate cavernous carotid stenosis bilaterally. The right A1 segment is hypoplastic. Anterior communicating artery is patent. No  significant ACA stenosis is seen.  M1 segments are patent without stenosis. Major MCA branch vessels appear patent without evidence of significant proximal stenosis. There may be a decreased number of more distal, small right MCA branch vessels corresponding to the areas of infarction. No intracranial aneurysm is identified.  Posterior circulation: Intracranial vertebral arteries are small in caliber bilaterally, with the left vertebral artery being severely hypoplastic distally and functionally ending in PICA. PICA origins are patent bilaterally. 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 There is a fetal origin of the right PCA. No significant PCA stenosis is seen.  Venous sinuses: Patent.  Anatomic variants: Persistent left trigeminal artery. Fetal origin of the right PCA. Hypoplastic right A1.  Delayed phase: Mild gyral enhancement associated with the right parieto-occipital infarcts.  IMPRESSION: 1. Variant intracranial arterial anatomy without large vessel occlusion. 2. Carotid siphon atherosclerosis with moderate bilateral  cavernous stenosis. 3. Likely subacute right parieto-occipital infarcts. 4. Mild cervical carotid artery atherosclerosis without stenosis.   Electronically Signed   By: Logan Bores   On: 11/11/2014 19:52   Mr Brain Wo Contrast (neuro Protocol)  11/11/2014   CLINICAL DATA:  Multiple episodes of visual disturbance over the past 2 weeks.  EXAM: MRI HEAD WITHOUT CONTRAST  TECHNIQUE: Multiplanar, multiecho pulse sequences of the brain and surrounding structures were obtained without intravenous contrast.  COMPARISON:  Head CT/ CTA earlier today  FINDINGS: There is a cortical and subcortical infarction in the right parietal lobe which demonstrates cytotoxic edema and mild diffusion weighted signal abnormality which largely appears facilitated, likely reflecting a subacute infarct. There is some associated  encephalomalacia in this region as well as gyral intrinsic T1 hyperintensity which may reflect mineralization or a small amount of petechial blood products and may indicate a degree of chronic ischemia in this region as well. Additionally, there is a small focus of cortical restricted diffusion along the superolateral aspect of this infarct which may reflect a small amount of superimposed acute ischemia.  More inferiorly and medially in the right parietal and right occipital lobes are areas of cortical and subcortical diffusion abnormality compatible with acute to early subacute ischemia, the largest measuring 2.9 cm in the occipital lobe and appearing relatively acute. There are also small foci of cortical diffusion abnormality in the left parietal, left occipital, and posterior left frontal lobes compatible with acute to subacute ischemia.  There is a small amount of petechial hemorrhage associated with the acute right occipital infarct. No frank parenchymal hematoma is identified. Ventricles are normal. There is no mass, midline shift, or extra-axial fluid collection. Several patchy foci of T2 hyperintensity oriented anterior to posterior in the left centrum semiovale are suggestive of chronic ischemia in a deep watershed pattern.  Orbits are unremarkable. Right maxillary sinus mucosal thickening is noted. Mastoid air cells are clear. Major intracranial vascular flow voids are preserved, with variant intracranial arterial anatomy more fully evaluated on recent CTA.  IMPRESSION: Multiple infarcts of varying ages involving the posterior right greater than left cerebral hemispheres, with the largest acute infarct being in the right occipital lobe.   Electronically Signed   By: Logan Bores   On: 11/11/2014 20:08    Review of Systems  Constitutional: Negative.   HENT: Negative.   Eyes: Positive for blurred vision and double vision. Negative for photophobia, pain, discharge and redness.  Respiratory: Negative.    Cardiovascular: Negative.   Gastrointestinal: Negative.   Genitourinary: Negative.   Musculoskeletal: Negative.   Skin: Negative.   Neurological: Positive for sensory change. Negative for dizziness, tingling, tremors, speech change, focal weakness, seizures and loss of consciousness.  Psychiatric/Behavioral: Negative.    Blood pressure 129/88, pulse 79, temperature 97.7 F (36.5 C), temperature source Oral, resp. rate 19, height 6' (1.829 m), weight 75.342 kg (166 lb 1.6 oz), SpO2 98 %. Physical Exam  Constitutional: He appears well-developed and well-nourished. No distress.  HENT:  Head: Normocephalic and atraumatic.  Nose: Nose normal.  Mouth/Throat: Oropharynx is clear and moist.  Eyes: EOM are normal. Pupils are equal, round, and reactive to light.  20/15 OS 20/30 OD  Cardiovascular: Normal rate, regular rhythm, normal heart sounds and intact distal pulses.   No murmur heard. Respiratory: Effort normal and breath sounds normal. No respiratory distress.  GI: Soft. Bowel sounds are normal. He exhibits no distension.  Musculoskeletal: Normal range of motion. He exhibits no edema.  Neurological:  A+Ox3, nl speech and language PERRLA, EOMI, R upper quadranopsia, face symmetric, tongue midline 5/5 B, nl tone 2+/4 B, downgoing plantars FTN and HTS WNL Intact temp and vibration and light touch  NIHSS 1  Skin: Skin is warm.  Psychiatric: His mood appears anxious.   MRI of brain personally reviewed by me and shows R occipital infarct with some embolic infarcts, no other old white matter changes  Assessment/Plan: 1.  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  This is mildly symptomatic 2.  Vision loss-  Stroke explains VF loss but does not explain asymetric visual acuity 3.  Hx of EtOH abuse- no withdrawal signs -  Needs opthtamology consult -  Echo pending and will  need TEE if neg -  Continue ASA 384m daily -  Lipids, blood cultures, and hypercoaguable w/u pending -  Will follow  Shantoria Ellwood 11/12/2014, 5:14 PM

## 2014-11-13 LAB — LIPID PANEL
CHOLESTEROL: 146 mg/dL (ref 0–200)
HDL: 24 mg/dL — ABNORMAL LOW (ref >40)
LDL Cholesterol: 97 mg/dL (ref 0–99)
Total CHOL/HDL Ratio: 6.1 RATIO
Triglycerides: 127 mg/dL (ref <150)
VLDL: 25 mg/dL (ref 0–40)

## 2014-11-13 LAB — LUPUS ANTICOAGULANT PANEL
DRVVT: 25.7 s (ref 0.0–55.1)
PTT Lupus Anticoagulant: 39.6 s (ref 0.0–50.0)

## 2014-11-13 LAB — HEMOGLOBIN A1C: HEMOGLOBIN A1C: 5.7 % (ref 4.0–6.0)

## 2014-11-13 LAB — HIGH SENSITIVITY CRP: CRP, High Sensitivity: 8.99 mg/L — ABNORMAL HIGH (ref 0.00–3.00)

## 2014-11-13 NOTE — Progress Notes (Signed)
Took over care of patient from Janalee Dane, RN. Madlyn Frankel, RN

## 2014-11-13 NOTE — Progress Notes (Signed)
TEE order has been placed. Will try to schedule when specials opens on Monday AM. Will make NPO for midnight Sunday midnight

## 2014-11-13 NOTE — Plan of Care (Signed)
Problem: Discharge/Transitional Outcomes Goal: Other Discharge Outcomes/Goals Pt alert and oriented-rested well during the night-NIH scale of 0

## 2014-11-13 NOTE — Progress Notes (Signed)
NEUROLOGY F/U  S:  Pt reports no changes, vision better  ROS neg x 8 systems  O:  AFVSS NAD, nl weight Normocephalic, oropharynx clear Supple, nl LAD CTA B, no wheezing RRR, no murmur No C/C/E  Alert and oriented x 3, nl speech and language PERRLA, EOMI, face symmetric 5/5 B  Echo normal  A/P: 1.  Multiple embolic infarcts-  Stable, suspect cardioembolic source -  TEE pending, if neg pt will need Loop recorder for 90 days -  Continue ASA 325mg  daily -  Will follow by Dr. Earnest Conroy

## 2014-11-13 NOTE — Progress Notes (Signed)
[facility] [place] NAME: Henry Boone    MR#:  [id number]  DATE OF BIRTH:  04-27-1965  SUBJECTIVE:  CHIEF COMPLAINT:   Chief Complaint  Patient presents with  . Eye Problem   Continues to have subtle visual field defects.  No other symptoms.  REVIEW OF SYSTEMS:   Review of Systems  Constitutional: Negative for fever.  Eyes: Positive for blurred vision.  Respiratory: Negative for shortness of breath.   Cardiovascular: Negative for chest pain and palpitations.  Gastrointestinal: Negative for nausea, vomiting and abdominal pain.  Genitourinary: Negative for dysuria.    DRUG ALLERGIES:  No Known Allergies  VITALS:  Blood pressure 142/87, pulse 79, temperature 98.1 F (36.7 C), temperature source Oral, resp. rate 20, height 6' (1.829 m), weight 75.342 kg (166 lb 1.6 oz), SpO2 99 %.  PHYSICAL EXAMINATION:  GENERAL:  49 y.o.-year-old patient sitting up in chair EYES: Pupils equal, round, reactive to light and accommodation. No scleral icterus. Extraocular muscles intact.  LUNGS: Normal breath sounds bilaterally, no wheezing, rales, rhonchi. No use of accessory muscles of respiration.  CARDIOVASCULAR: S1, S2 normal. No murmurs, rubs, or gallops.  ABDOMEN: Soft, nontender, nondistended. Bowel sounds present. No organomegaly or mass.  EXTREMITIES: No pedal edema, cyanosis, or clubbing.  NEUROLOGIC: Cranial nerves II through XII are intact. Muscle strength 5/5 in all extremities. Sensation intact. Gait not checked.  PSYCHIATRIC: The patient is alert and oriented x 3.  SKIN: No obvious rash, lesion, or ulcer.   LABORATORY PANEL:   CBC  Recent Labs Lab 11/11/14 1609  WBC 12.2*  HGB 16.2  HCT 48.2  PLT 327   ------------------------------------------------------------------------------------------------------------------  Chemistries   Recent Labs Lab 11/11/14 1609  NA 133*  K 3.8  CL 96*  CO2 26  GLUCOSE 116*   BUN 7  CREATININE 0.78  CALCIUM 9.6   ------------------------------------------------------------------------------------------------------------------  Cardiac Enzymes  Recent Labs Lab 11/11/14 1609  TROPONINI <0.03   ------------------------------------------------------------------------------------------------------------------  RADIOLOGY:  Ct Angio Head W/cm &/or Wo Cm  11/11/2014   CLINICAL DATA:  TIA. Multiple episodes of visual disturbance in the past 2 weeks.  EXAM: CT ANGIOGRAPHY HEAD AND NECK  TECHNIQUE: Multidetector CT imaging of the head and neck was performed using the standard protocol during bolus administration of intravenous contrast. Multiplanar CT image reconstructions and MIPs were obtained to evaluate the vascular anatomy. Carotid stenosis measurements (when applicable) are obtained utilizing NASCET criteria, using the distal internal carotid diameter as the denominator.  CONTRAST:  16mL OMNIPAQUE IOHEXOL 350 MG/ML SOLN  COMPARISON:  Head CT earlier today  FINDINGS: CT HEAD  Brain: Cortical and subcortical white matter hypoattenuation in the right occipital and right parietal lobes as seen on noncontrast head CT earlier today and suggestive of potentially subacute infarcts. Small foci of hypoattenuation in the left centrum semiovale. Ventricles are normal. No mass, midline shift, or extra-axial fluid collection.  Calvarium and skull base: No fracture.  Paranasal sinuses: Moderate, polypoid mucosal thickening inferiorly in the right maxillary sinus.  Orbits: Unremarkable.  CTA NECK  Aortic arch: Common origin of the brachiocephalic and left common carotid arteries is noted, a normal variant. The brachiocephalic and subclavian arteries are widely patent. Mild calcified plaque is noted in the right subclavian artery.  Right carotid system: Mild calcified and noncalcified plaque in the common and proximal internal carotid arteries without stenosis.  Left carotid system: Mild  calcified and noncalcified atheromatous plaque throughout the common and cervical internal carotid  arteries without stenosis.  Vertebral arteries:Vertebral arteries are patent and somewhat small in caliber bilaterally. There is focal atherosclerotic calcification at the right vertebral artery origin with no more than mild stenosis.  Skeleton: Mild mid to lower cervical spondylosis. Partial fusion of the post for lateral right first and second ribs.  Other neck: Paraseptal greater than centrilobular emphysema in the lung apices.  CTA HEAD  Anterior circulation: Internal carotid arteries are patent from skullbase to carotid termini with moderate carotid siphon calcification bilaterally. There is moderate cavernous carotid stenosis bilaterally. The right A1 segment is hypoplastic. Anterior communicating artery is patent. No significant ACA stenosis is seen.  M1 segments are patent without stenosis. Major MCA branch vessels appear patent without evidence of significant proximal stenosis. There may be a decreased number of more distal, small right MCA branch vessels corresponding to the areas of infarction. No intracranial aneurysm is identified.  Posterior circulation: Intracranial vertebral arteries are small in caliber bilaterally, with the left vertebral artery being severely hypoplastic distally and functionally ending in PICA. PICA origins are patent bilaterally. 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 There is a fetal origin of the right PCA. No significant PCA stenosis is seen.  Venous sinuses: Patent.  Anatomic variants: Persistent left trigeminal artery. Fetal origin of the right PCA. Hypoplastic right A1.  Delayed phase: Mild gyral enhancement associated with the right parieto-occipital infarcts.  IMPRESSION: 1. Variant intracranial arterial anatomy without large vessel occlusion. 2. 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 3. Likely subacute right parieto-occipital infarcts. 4. Mild cervical carotid artery atherosclerosis without stenosis.   Electronically Signed   By: Logan Bores   On: 11/11/2014 19:52   Ct Head Wo Contrast  11/11/2014   CLINICAL DATA:  Visual difficulties, recent ETOH withdrawal  EXAM: CT HEAD WITHOUT CONTRAST  TECHNIQUE: Contiguous axial images were obtained from the base of the skull through the vertex without intravenous contrast.  COMPARISON:  None.  FINDINGS: The bony calvarium is intact. There is a geographic area of decreased attenuation high in the vertex in the right parietal lobe with a smaller area decreased attenuation identified slightly more inferior and posterior. These changes are consistent with prior ischemia or possibly posttraumatic encephalomalacia. No findings to suggest acute hemorrhage, acute infarction or space-occupying mass lesion are noted.  IMPRESSION: Chronic changes in the right parietal lobe as described.  No acute abnormality   Electronically Signed   By: Inez Catalina M.D.   On: 11/11/2014 16:20   Ct Angio Neck W/cm &/or Wo/cm  11/11/2014   CLINICAL DATA:  TIA. Multiple episodes of visual disturbance in the past 2 weeks.  EXAM: CT ANGIOGRAPHY HEAD AND NECK  TECHNIQUE: Multidetector CT imaging of the head and neck was performed using the standard protocol during bolus administration of intravenous contrast. Multiplanar CT image reconstructions and MIPs were obtained to evaluate the vascular anatomy. Carotid stenosis measurements (when applicable) are obtained utilizing NASCET criteria, using the distal internal carotid diameter as the denominator.  CONTRAST:  134mL OMNIPAQUE IOHEXOL 350 MG/ML SOLN  COMPARISON:  Head CT earlier today  FINDINGS: CT HEAD  Brain: Cortical and subcortical white matter hypoattenuation in the right occipital and right parietal lobes as seen on noncontrast head CT earlier today and suggestive of potentially  subacute infarcts. Small foci of hypoattenuation in the left centrum semiovale. Ventricles are normal. No mass, midline shift, or extra-axial fluid collection.  Calvarium and skull base:  No fracture.  Paranasal sinuses: Moderate, polypoid mucosal thickening inferiorly in the right maxillary sinus.  Orbits: Unremarkable.  CTA NECK  Aortic arch: Common origin of the brachiocephalic and left common carotid arteries is noted, a normal variant. The brachiocephalic and subclavian arteries are widely patent. Mild calcified plaque is noted in the right subclavian artery.  Right carotid system: Mild calcified and noncalcified plaque in the common and proximal internal carotid arteries without stenosis.  Left carotid system: Mild calcified and noncalcified atheromatous plaque throughout the common and cervical internal carotid arteries without stenosis.  Vertebral arteries:Vertebral arteries are patent and somewhat small in caliber bilaterally. There is focal atherosclerotic calcification at the right vertebral artery origin with no more than mild stenosis.  Skeleton: Mild mid to lower cervical spondylosis. Partial fusion of the post for lateral right first and second ribs.  Other neck: Paraseptal greater than centrilobular emphysema in the lung apices.  CTA HEAD  Anterior circulation: Internal carotid arteries are patent from skullbase to carotid termini with moderate carotid siphon calcification bilaterally. There is moderate cavernous carotid stenosis bilaterally. The right A1 segment is hypoplastic. Anterior communicating artery is patent. No significant ACA stenosis is seen.  M1 segments are patent without stenosis. Major MCA branch vessels appear patent without evidence of significant proximal stenosis. There may be a decreased number of more distal, small right MCA branch vessels corresponding to the areas of infarction. No intracranial aneurysm is identified.  Posterior circulation: Intracranial vertebral arteries are  small in caliber bilaterally, with the left vertebral artery being severely hypoplastic distally and functionally ending in PICA. PICA origins are patent bilaterally. 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 There is a fetal origin of the right PCA. No significant PCA stenosis is seen.  Venous sinuses: Patent.  Anatomic variants: Persistent left trigeminal artery. Fetal origin of the right PCA. Hypoplastic right A1.  Delayed phase: Mild gyral enhancement associated with the right parieto-occipital infarcts.  IMPRESSION: 1. Variant intracranial arterial anatomy without large vessel occlusion. 2. 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 3. Likely subacute right parieto-occipital infarcts. 4. Mild cervical carotid artery atherosclerosis without stenosis.   Electronically Signed   By: Logan Bores   On: 11/11/2014 19:52   Mr Brain Wo Contrast (neuro Protocol)  11/11/2014   CLINICAL DATA:  Multiple episodes of visual disturbance over the past 2 weeks.  EXAM: MRI HEAD WITHOUT CONTRAST  TECHNIQUE: Multiplanar, multiecho pulse sequences of the brain and surrounding structures were obtained without intravenous contrast.  COMPARISON:  Head CT/ CTA earlier today  FINDINGS: There is a cortical and subcortical infarction in the right parietal lobe which demonstrates cytotoxic edema and mild diffusion weighted signal abnormality which largely appears facilitated, likely reflecting a subacute infarct. There is some associated encephalomalacia in this region as well as gyral intrinsic T1 hyperintensity which may reflect mineralization or a small amount of petechial blood products and may indicate a degree of chronic ischemia in this region as well. Additionally, there is a small focus of cortical restricted diffusion along the superolateral aspect of this infarct which may reflect a small amount of superimposed  acute ischemia.  More inferiorly and medially in the right parietal and right occipital lobes are areas of cortical and subcortical diffusion abnormality compatible with acute to early subacute ischemia, the largest measuring 2.9 cm in the occipital lobe and appearing relatively acute. There are also small foci of cortical diffusion abnormality in the  left parietal, left occipital, and posterior left frontal lobes compatible with acute to subacute ischemia.  There is a small amount of petechial hemorrhage associated with the acute right occipital infarct. No frank parenchymal hematoma is identified. Ventricles are normal. There is no mass, midline shift, or extra-axial fluid collection. Several patchy foci of T2 hyperintensity oriented anterior to posterior in the left centrum semiovale are suggestive of chronic ischemia in a deep watershed pattern.  Orbits are unremarkable. Right maxillary sinus mucosal thickening is noted. Mastoid air cells are clear. Major intracranial vascular flow voids are preserved, with variant intracranial arterial anatomy more fully evaluated on recent CTA.  IMPRESSION: Multiple infarcts of varying ages involving the posterior right greater than left cerebral hemispheres, with the largest acute infarct being in the right occipital lobe.   Electronically Signed   By: Logan Bores   On: 11/11/2014 20:08    EKG:   Orders placed or performed during the hospital encounter of 11/11/14  . ED EKG  . ED EKG  . EKG 12-Lead  . EKG 12-Lead    ASSESSMENT AND PLAN:   1) multiple cerebral infarcts - appreciate neurology consultation - only symptom seems to be vision loss - ECHO normal, TEE planned for Monday - continue ASA 325 - blood cultures negative, lipids OK - may need holter monitor after dc  2) vision loss - will need outpatient opthalmology appointment at discharge  3) ETOH abuse - denies symptoms of withdrawal  4) ongoing smoking - declines nicotine patch - not  ready to quit, counseling provided  CODE STATUS: full TOTAL TIME TAKING CARE OF THIS PATIENT: 30 minutes.  Greater than 50% of time spent in care coordination and counseling. POSSIBLE D/C IN 2 DAYS, DEPENDING ON CLINICAL CONDITION.   Myrtis Ser M.D on 11/13/2014 at 11:36 AM  Between 7am to 6pm - Pager - [phone]  After 6pm go to [email or web address] - password [email or web address] [facility]  [place] [facility]  Office  [phone]  CC: Primary care physician; No PCP Per Patient

## 2014-11-13 NOTE — Plan of Care (Signed)
Problem: Discharge/Transitional Outcomes Goal: Other Discharge Outcomes/Goals Outcome: Progressing No complaints, NIH 0. Pt out of bed and walking today. Discharge tomorrow

## 2014-11-14 DIAGNOSIS — I6312 Cerebral infarction due to embolism of basilar artery: Secondary | ICD-10-CM

## 2014-11-14 LAB — CBC
HCT: 47.2 % (ref 40.0–52.0)
Hemoglobin: 15.9 g/dL (ref 13.0–18.0)
MCH: 30.4 pg (ref 26.0–34.0)
MCHC: 33.6 g/dL (ref 32.0–36.0)
MCV: 90.5 fL (ref 80.0–100.0)
Platelets: 310 10*3/uL (ref 150–440)
RBC: 5.22 MIL/uL (ref 4.40–5.90)
RDW: 13 % (ref 11.5–14.5)
WBC: 8.5 10*3/uL (ref 3.8–10.6)

## 2014-11-14 NOTE — Plan of Care (Signed)
Problem: Discharge/Transitional Outcomes Goal: Other Discharge Outcomes/Goals Outcome: Progressing TEE to be done tomorrow and pt will be discharged home after TEE results.

## 2014-11-14 NOTE — Plan of Care (Signed)
Problem: Discharge/Transitional Outcomes Goal: Other Discharge Outcomes/Goals Outcome: Progressing Patient ambulated in hall and then resting comfortably in room throughout shift.  NIH scale = 0.  TEE to be done tomorrow and pt will be discharged home after TEE results.  BP 144/86 mmHg  Pulse 74  Temp(Src) 98 F (36.7 C) (Oral)  Resp 18  Ht 6' (1.829 m)  Wt 166 lb 1.6 oz (75.342 kg)  BMI 22.52 kg/m2  SpO2 99%

## 2014-11-14 NOTE — Progress Notes (Signed)
[facility] [place] NAME: Henry Boone    MR#:  [id number]  DATE OF BIRTH:  Jan 09, 1966  SUBJECTIVE:  CHIEF COMPLAINT:   Chief Complaint  Patient presents with  . Eye Problem   Continues to have subtle visual field defects, stable. Anxious from sitting in hospital room for several days.  REVIEW OF SYSTEMS:   Review of Systems  Constitutional: Negative for fever.  Eyes: Positive for blurred vision.  Respiratory: Negative for shortness of breath.   Cardiovascular: Negative for chest pain and palpitations.  Gastrointestinal: Negative for nausea, vomiting and abdominal pain.  Genitourinary: Negative for dysuria.    DRUG ALLERGIES:  No Known Allergies  VITALS:  Blood pressure 132/86, pulse 73, temperature 97.6 F (36.4 C), temperature source Oral, resp. rate 18, height 6' (1.829 m), weight 75.342 kg (166 lb 1.6 oz), SpO2 98 %.  PHYSICAL EXAMINATION:  GENERAL:  49 y.o.-year-old patient sitting up in chair EYES: Pupils equal, round, reactive to light and accommodation. No scleral icterus. Extraocular muscles intact.  LUNGS: Normal breath sounds bilaterally, no wheezing, rales, rhonchi. No use of accessory muscles of respiration.  CARDIOVASCULAR: S1, S2 normal. No murmurs, rubs, or gallops.  ABDOMEN: Soft, nontender, nondistended. Bowel sounds present. No organomegaly or mass.  EXTREMITIES: No pedal edema, cyanosis, or clubbing.  NEUROLOGIC: Cranial nerves II through XII are intact. Muscle strength 5/5 in all extremities. Sensation intact. Gait not checked.  PSYCHIATRIC: The patient is alert and oriented x 3.  SKIN: No obvious rash, lesion, or ulcer.   LABORATORY PANEL:   CBC  Recent Labs Lab 11/14/14 0459  WBC 8.5  HGB 15.9  HCT 47.2  PLT 310   ------------------------------------------------------------------------------------------------------------------  Chemistries   Recent Labs Lab 11/11/14 1609  NA 133*   K 3.8  CL 96*  CO2 26  GLUCOSE 116*  BUN 7  CREATININE 0.78  CALCIUM 9.6   ------------------------------------------------------------------------------------------------------------------  Cardiac Enzymes  Recent Labs Lab 11/11/14 1609  TROPONINI <0.03   ------------------------------------------------------------------------------------------------------------------  RADIOLOGY:  No results found.  EKG:   Orders placed or performed during the hospital encounter of 11/11/14  . ED EKG  . ED EKG  . EKG 12-Lead  . EKG 12-Lead    ASSESSMENT AND PLAN:   1) multiple cerebral infarcts - appreciate neurology consultation - only symptom seems to be vision loss - ECHO normal, TEE planned for Monday by Dr Rockey Situ - continue ASA 325 - blood cultures negative, lipids OK - may need holter monitor after dc  2) vision loss - will need outpatient opthalmology appointment at discharge  3) ETOH abuse - denies symptoms of withdrawal, though he does seem anxious  4) ongoing smoking - declines nicotine patch due to nightmares - not ready to quit, counseling provided  CODE STATUS: full TOTAL TIME TAKING CARE OF THIS PATIENT: 30 minutes.   Greater than 50% of time spent in care coordination and counseling. POSSIBLE D/C IN 2 DAYS, DEPENDING ON CLINICAL CONDITION.   Myrtis Ser M.D on 11/14/2014 at 12:34 PM  Between 7am to 6pm - Pager - [phone]  After 6pm go to [email or web address] - password [email or web address] [facility]  [place] [facility]  Office  [phone]  CC: Primary care physician; No PCP Per Patient

## 2014-11-14 NOTE — Plan of Care (Signed)
Problem: Discharge/Transitional Outcomes Goal: Other Discharge Outcomes/Goals Outcome: Progressing Pt resting well throughout shift, pt ambulating in hallway, NIH scale 0 , possible discharge today.

## 2014-11-14 NOTE — Progress Notes (Signed)
Stroke Team Progress Note      SUBJECTIVE Pt has multiple posterior circulation infarcts. Vision as improved. Scatomas improved as well. Ambulating without assistance.    OBJECTIVE Most recent Vital Signs: Filed Vitals:   11/14/14 0105 11/14/14 0513 11/14/14 1306 11/14/14 1600  BP: 120/83 132/86 159/87 153/84  Pulse: 74 73 96   Temp:  97.6 F (36.4 C)    TempSrc:  Oral    Resp: 18 18 21    Height:      Weight:      SpO2: 100% 98% 100%    CBG (last 3)  No results for input(s): GLUCAP in the last 72 hours.  IV Fluid Intake:     MEDICATIONS  . aspirin EC  325 mg Oral Daily  . folic acid  1 mg Oral Daily  . multivitamin with minerals  1 tablet Oral Daily  . thiamine  100 mg Oral Daily   Or  . thiamine  100 mg Intravenous Daily   PRN:  LORazepam **OR** LORazepam  Diet:  Diet regular Room service appropriate?: Yes; Fluid consistency:: Thin Diet NPO time specified Except for: Sips with Meds Diet NPO time specified   CLINICALLY SIGNIFICANT STUDIES Basic Metabolic Panel:  Recent Labs Lab 11/11/14 1609  NA 133*  K 3.8  CL 96*  CO2 26  GLUCOSE 116*  BUN 7  CREATININE 0.78  CALCIUM 9.6   Liver Function Tests: No results for input(s): AST, ALT, ALKPHOS, BILITOT, PROT, ALBUMIN in the last 168 hours. CBC:  Recent Labs Lab 11/11/14 1609 11/14/14 0459  WBC 12.2* 8.5  NEUTROABS 8.8*  --   HGB 16.2 15.9  HCT 48.2 47.2  MCV 91.1 90.5  PLT 327 310   Coagulation: No results for input(s): LABPROT, INR in the last 168 hours. Cardiac Enzymes:  Recent Labs Lab 11/11/14 1609  TROPONINI <0.03   Urinalysis: No results for input(s): COLORURINE, LABSPEC, PHURINE, GLUCOSEU, HGBUR, BILIRUBINUR, KETONESUR, PROTEINUR, UROBILINOGEN, NITRITE, LEUKOCYTESUR in the last 168 hours.  Invalid input(s): APPERANCEUR Lipid Panel    Component Value Date/Time   CHOL 146 11/13/2014 0527   TRIG 127 11/13/2014 0527   HDL 24* 11/13/2014 0527   CHOLHDL 6.1 11/13/2014 0527   VLDL 25  11/13/2014 0527   LDLCALC 97 11/13/2014 0527   HgbA1C  Lab Results  Component Value Date   HGBA1C 5.7 11/12/2014    Urine Drug Screen:  No results found for: LABOPIA, COCAINSCRNUR, LABBENZ, AMPHETMU, THCU, LABBARB  Alcohol Level: No results for input(s): ETH in the last 168 hours.  No results found.  CTA: IMPRESSION: 1. Variant intracranial arterial anatomy without large vessel occlusion. 2. 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 3. Likely subacute right parieto-occipital infarcts. 4. Mild cervical carotid artery atherosclerosis without stenosis.   MRI of the brain  Posterior circulation infarcts   MRA of the brain      2D Echocardiogram  EF 60-65%   CXR    EKG  normal EKG, normal sinus rhythm, unchanged from previous tracings. For complete results please see formal report.     Physical exam   Neurological Examination Mental Status: Alert, oriented, thought content appropriate.  Speech fluent without evidence of aphasia.  Able to follow 3 step commands without difficulty. Cranial Nerves: II: Discs flat bilaterally; Visual fields grossly normal, pupils equal, round, reactive to light and accommodation III,IV, VI: ptosis not present, extra-ocular motions intact bilaterally V,VII: smile symmetric, facial light touch sensation normal bilaterally VIII: hearing normal bilaterally IX,X: gag  reflex present XI: bilateral shoulder shrug XII: midline tongue extension Motor: Right : Upper extremity   5/5    Left:     Upper extremity   5/5  Lower extremity   5/5     Lower extremity   5/5 Tone and bulk:normal tone throughout; no atrophy noted Sensory: Pinprick and light touch intact throughout, bilaterally Deep Tendon Reflexes: 2+ and symmetric throughout Plantars: Right: downgoing   Left: downgoing Cerebellar: normal finger-to-nose, normal rapid alternating movements and normal heel-to-shin test Gait: normal gait and station       ASSESSMENT 49 y/o male with multiple posterior circulation infarcts. Vision as improved. Scatomas improved as well. Ambulating without assistance. Vision close to baseline.    Hospital day # 2  TREATMENT/PLAN  Continue ASA 325 daily  NPO after midnight. TEE in AM  CIWA protocol for possible ETOh withdrawal  Thiamine/folate  If TEE negative, holter as out pt.    Leotis Pain   SIGNED    To contact Stroke Continuity provider, please refer to [email or web address] After hours, contact General Neurology

## 2014-11-14 NOTE — Progress Notes (Signed)
Pt called RN and said he had seen some "lights flash" and getting anxious about going home. BP 153/84 reported to Dr. No new orders

## 2014-11-15 ENCOUNTER — Encounter: Payer: Self-pay | Admitting: *Deleted

## 2014-11-15 ENCOUNTER — Inpatient Hospital Stay (HOSPITAL_COMMUNITY)
Admit: 2014-11-15 | Discharge: 2014-11-15 | Disposition: A | Discharge status: Home / Self Care | Payer: 59 | Attending: Cardiovascular Disease | Admitting: Cardiovascular Disease

## 2014-11-15 ENCOUNTER — Other Ambulatory Visit: Payer: Self-pay | Admitting: Cardiovascular Disease

## 2014-11-15 ENCOUNTER — Encounter
Admission: EM | Disposition: A | Discharge status: Home / Self Care | Payer: Self-pay | Source: Home / Self Care | Attending: Internal Medicine

## 2014-11-15 DIAGNOSIS — G459 Transient cerebral ischemic attack, unspecified: Secondary | ICD-10-CM

## 2014-11-15 HISTORY — PX: TEE WITHOUT CARDIOVERSION: SHX5443

## 2014-11-15 LAB — CBC
HCT: 44.3 % (ref 40.0–52.0)
HEMOGLOBIN: 15.3 g/dL (ref 13.0–18.0)
MCH: 31.2 pg (ref 26.0–34.0)
MCHC: 34.5 g/dL (ref 32.0–36.0)
MCV: 90.2 fL (ref 80.0–100.0)
PLATELETS: 311 10*3/uL (ref 150–440)
RBC: 4.91 MIL/uL (ref 4.40–5.90)
RDW: 13.1 % (ref 11.5–14.5)
WBC: 8.3 10*3/uL (ref 3.8–10.6)

## 2014-11-15 LAB — BASIC METABOLIC PANEL
Anion gap: 6 (ref 5–15)
BUN: 9 mg/dL (ref 6–20)
CHLORIDE: 102 mmol/L (ref 101–111)
CO2: 29 mmol/L (ref 22–32)
Calcium: 9.2 mg/dL (ref 8.9–10.3)
Creatinine, Ser: 0.81 mg/dL (ref 0.61–1.24)
GFR calc Af Amer: >60 mL/min (ref >60)
Glucose, Bld: 105 mg/dL — ABNORMAL HIGH (ref 65–99)
Potassium: 4.2 mmol/L (ref 3.5–5.1)
Sodium: 137 mmol/L (ref 135–145)

## 2014-11-15 SURGERY — Surgical Case
Anesthesia: *Unknown

## 2014-11-15 SURGERY — ECHOCARDIOGRAM, TRANSESOPHAGEAL
Anesthesia: Moderate Sedation

## 2014-11-15 MED ORDER — BUTAMBEN-TETRACAINE-BENZOCAINE 2-2-14 % EX AERO
INHALATION_SPRAY | CUTANEOUS | Status: AC
  Filled 2014-11-15: qty 20

## 2014-11-15 MED ORDER — ADULT MULTIVITAMIN W/MINERALS CH: 1.0000 | ORAL_TABLET | Freq: Every day | ORAL | Status: AC

## 2014-11-15 MED ORDER — LIDOCAINE VISCOUS 2 % MT SOLN
OROMUCOSAL | Status: AC
  Filled 2014-11-15: qty 15

## 2014-11-15 MED ORDER — FENTANYL CITRATE (PF) 100 MCG/2ML IJ SOLN
INTRAMUSCULAR | Status: DC | PRN
  Administered 2014-11-15: 50 ug via INTRAVENOUS
  Administered 2014-11-15: 25 ug via INTRAVENOUS
  Administered 2014-11-15: 50 ug via INTRAVENOUS
  Administered 2014-11-15: 25 ug via INTRAVENOUS
  Administered 2014-11-15: 50 ug via INTRAVENOUS

## 2014-11-15 MED ORDER — ASPIRIN 325 MG PO TBEC: 325.0000 mg | DELAYED_RELEASE_TABLET | Freq: Every day | ORAL | Status: DC

## 2014-11-15 MED ORDER — FLUMAZENIL 0.5 MG/5ML IV SOLN
INTRAVENOUS | Status: AC
  Filled 2014-11-15: qty 5

## 2014-11-15 MED ORDER — MIDAZOLAM HCL 5 MG/5ML IJ SOLN
INTRAMUSCULAR | Status: AC
  Filled 2014-11-15: qty 5

## 2014-11-15 MED ORDER — PRAVASTATIN SODIUM 40 MG PO TABS: 40.0000 mg | ORAL_TABLET | Freq: Every day | ORAL | Status: DC

## 2014-11-15 MED ORDER — MIDAZOLAM HCL 5 MG/5ML IJ SOLN
INTRAMUSCULAR | Status: DC | PRN
  Administered 2014-11-15: 1 mg via INTRAVENOUS
  Administered 2014-11-15: 2 mg via INTRAVENOUS

## 2014-11-15 MED ORDER — MIDAZOLAM HCL 5 MG/ML IJ SOLN
INTRAMUSCULAR | Status: DC | PRN
  Administered 2014-11-15: 1 mg via INTRAVENOUS
  Administered 2014-11-15: 2 mg via INTRAVENOUS
  Administered 2014-11-15: 1 mg via INTRAVENOUS

## 2014-11-15 MED ORDER — FENTANYL CITRATE (PF) 100 MCG/2ML IJ SOLN
INTRAMUSCULAR | Status: AC
  Filled 2014-11-15: qty 4

## 2014-11-15 NOTE — Discharge Instructions (Signed)
DIET:  Low fat, Low cholesterol diet  DISCHARGE CONDITION:  Fair  ACTIVITY:  Activity as tolerated  OXYGEN:  Home Oxygen: No.   Oxygen Delivery: room air  DISCHARGE LOCATION:  home   If you experience worsening of your admission symptoms, develop shortness of breath, life threatening emergency, suicidal or homicidal thoughts you must seek medical attention immediately by calling 911 or calling your MD immediately  if symptoms less severe.  You Must read complete instructions/literature along with all the possible adverse reactions/side effects for all the Medicines you take and that have been prescribed to you. Take any new Medicines after you have completely understood and accpet all the possible adverse reactions/side effects.   Please note  You were cared for by a hospitalist during your hospital stay. If you have any questions about your discharge medications or the care you received while you were in the hospital after you are discharged, you can call the unit and asked to speak with the hospitalist on call if the hospitalist that took care of you is not available. Once you are discharged, your primary care physician will handle any further medical issues. Please note that NO REFILLS for any discharge medications will be authorized once you are discharged, as it is imperative that you return to your primary care physician (or establish a relationship with a primary care physician if you do not have one) for your aftercare needs so that they can reassess your need for medications and monitor your lab values.   Amaurosis Fugax Amaurosis fugax is a condition in which a person loses sight in one eye. The loss of vision in the affected eye may be total or partial. It usually lasts just a few seconds or minutes. Then, it returns to normal. Occasionally, it may last for several hours. This is caused by interruption of blood flow to the artery that supplies blood to the retina (lining at  the back of the eye, contains nerves needed for sight). The temporary loss of blood flow causes symptoms similar to a stroke. The family of symptoms that happen from a loss of blood flow is called a Transient Ischemic Attack (TIA, mini-stroke). In the case of amaurosis fugax, the eye is the organ that is involved. SYMPTOMS   Painless, sudden loss of vision in one eye.  Visual loss is often from the top down, appearing like a curtain being pulled down over the field of vision.  Rapid return of vision. Vision generally comes back in a few minutes to several hours. CAUSES  TIAs and amaurosis fugax are caused by a loss of blood flow. This can be due to a buildup of cholesterol and fats (plaque) in the arteries or the heart. If some of that plaque comes off the artery and gets into the bloodstream, it can flow to the artery that supplies blood to the retina, blocking the flow of blood to the retina. When that happens, vision is lost for as long as the blood flow is interrupted. Factors that make it more likely you will have amaurosis fugax at some point include:  Smoking.  Poorly controlled diabetes.  High blood pressure.  High cholesterol levels. Medical conditions that may increase the risk of an attack of amaurosis fugax include:  Heart disease.  Diseases of the heart valves.  Certain diseases of the blood (sickle cell anemia, leukemia).  Blood clotting (coagulation) disorders.  Artery inflammation (temporal arteritis, giant cell arteritis). Since amaurosis fugax is an "incomplete stroke," in some  people it can be a sign of an increased risk for an actual stroke. A stroke can result in permanent vision loss or loss of other body functions. As a result, caring for yourself after amaurosis fugax means taking many of the same steps you should take to prevent a stroke. HOME CARE INSTRUCTIONS   Only take over-the-counter or prescription medicines for pain, discomfort, or fever as directed  by your caregiver.  Take any medicines that are prescribed for control of your blood pressure and cholesterol levels.  Keep diabetes under control as well as possible.  Stop smoking.  Follow diet instructions, if your caregiver has given them to you.  Try to get at least 30 minutes of moderate physical activity every day. If you have not been active, talk to your caregiver about how to get started. SEEK IMMEDIATE MEDICAL CARE IF:   You lose vision in one or both eyes again, even if only for a short period of time.  You lose vision in one eye and it does not recover within a very brief time (less than 5-10 minutes). The sooner you see an eye specialist (ophthalmologist), the better the chance of regaining some vision, in the case of a central retinal artery occlusion (CRAO, blockage of central retinal artery). However, most cases of CRAO result in some degree of permanent visual loss, even with aggressive treatment.  You have symptoms of a stroke:  Weakness in one side of your body.  Difficulty speaking or thinking clearly.  Lack of coordination. Document Released: 01/10/2008 Document Revised: 06/25/2011 Document Reviewed: 01/10/2008 [facility] Patient Information 2015 [place], [place]. This information is not intended to replace advice given to you by your health care provider. Make sure you discuss any questions you have with your health care provider.

## 2014-11-15 NOTE — Progress Notes (Signed)
During time of pt receiving iv sedation versed/fentanyl, aldrete/pain score unchanged, vitals remained stable,entire procedure,pt awake during procedure.denied pain entire procedure.

## 2014-11-15 NOTE — Progress Notes (Signed)
TEE results note Please see procedure note for complete details  No evidence of thrombus noted in the left atrium, left atrial appendage, or left ventricle. Saline contrast injected with intact interatrial septum both at rest and with Valsalva. Normal RV function, no significant valve disease  There is mild atherosclerosis of the aortic arch and descending aorta.

## 2014-11-15 NOTE — Sedation Documentation (Signed)
Pt tee done per Dr Rockey Situ, vss throughout entire procedure, required large dosing of versed/fentanyl. Awake during entire procedure, denied complaints pain, during and after, talking before and after procedure. sr per monitor,Dr gollan speaking with patient after procedure done with questions answered. Tolerated procedure well.

## 2014-11-15 NOTE — Progress Notes (Signed)
Consent formed signed for TEE. Handout given to patient regarding procedure. Madlyn Frankel, RN

## 2014-11-15 NOTE — Discharge Summary (Signed)
[place] [facility] [place]   PATIENT NAME: Henry Boone    MR#:  [id number]  DATE OF BIRTH:  1965/08/07  DATE OF ADMISSION:  11/11/2014 ADMITTING PHYSICIAN: Baxter Hire, MD  DATE OF DISCHARGE: 11/15/14  PRIMARY CARE PHYSICIAN: No PCP Per Patient    ADMISSION DIAGNOSIS:  TIA (transient ischemic attack) [G45.9] Amaurosis fugax of right eye [G45.3]  DISCHARGE DIAGNOSIS:  Active Problems:   Amaurosis fugax   Cerebral thrombosis with cerebral infarction   CVA (cerebral infarction)   SECONDARY DIAGNOSIS:   Past Medical History  Diagnosis Date  . ETOH abuse     HOSPITAL COURSE:   #1 multiple posterior circulation infarct: Only symptom has been visual change which seems to be improving. He was followed by neurology throughout the hospitalization. He is now on aspirin 325 mg daily as well as Pravachol 40 mg daily. He has had a 2-D echocardiogram and a TEE both negative for cardioembolic source. He will need to have a Holter monitor in the outpatient setting to evaluate for possible arrhythmia, paroxysmal atrial fibrillation. He has no further physical or occupational therapy needs. He has been advised to stop smoking.  #2 alcohol abuse: The patient has recently quit drinking alcohol, formally a heavy drinker. No signs of withdrawal during the hospitalization. He may have underlying general anxiety disorder.  #3 tobacco abuse: Patient has been advised to stop smoking. He does not seem to be ready to do the states that he just quit drinking and would like to wait on quitting smoking. He understands that smoking increases his risk of further stroke and cardiovascular disease.  #4 visual disturbance: He will need an outpatient ophthalmology appointment to further evaluate his visual change.  DISCHARGE CONDITIONS:   Stable  CONSULTS OBTAINED:  Treatment Team:  Valora Corporal, MD Minna Merritts, MD  DRUG ALLERGIES:  No  Known Allergies  DISCHARGE MEDICATIONS:   Current Discharge Medication List    START taking these medications   Details  aspirin EC 325 MG EC tablet Take 1 tablet (325 mg total) by mouth daily. Qty: 30 tablet, Refills: 0    Multiple Vitamin (MULTIVITAMIN WITH MINERALS) TABS tablet Take 1 tablet by mouth daily. Qty: 30 tablet, Refills: 12    pravastatin (PRAVACHOL) 40 MG tablet Take 1 tablet (40 mg total) by mouth daily. Qty: 30 tablet, Refills: 1         DISCHARGE INSTRUCTIONS:    DIET:  Low fat, Low cholesterol diet  DISCHARGE CONDITION:  Fair  ACTIVITY:  Activity as tolerated  OXYGEN:  Home Oxygen: No.   Oxygen Delivery: room air  DISCHARGE LOCATION:  home     If you experience worsening of your admission symptoms, develop shortness of breath, life threatening emergency, suicidal or homicidal thoughts you must seek medical attention immediately by calling 911 or calling your MD immediately  if symptoms less severe.  You Must read complete instructions/literature along with all the possible adverse reactions/side effects for all the Medicines you take and that have been prescribed to you. Take any new Medicines after you have completely understood and accept all the possible adverse reactions/side effects.   Please note  You were cared for by a hospitalist during your hospital stay. If you have any questions about your discharge medications or the care you received while you were in the hospital after you are discharged, you can call the unit and asked to speak with the hospitalist on call  if the hospitalist that took care of you is not available. Once you are discharged, your primary care physician will handle any further medical issues. Please note that NO REFILLS for any discharge medications will be authorized once you are discharged, as it is imperative that you return to your primary care physician (or establish a relationship with a primary care physician if  you do not have one) for your aftercare needs so that they can reassess your need for medications and monitor your lab values.    Today   CHIEF COMPLAINT:   Chief Complaint  Patient presents with  . Eye Problem   No complaints at the time of discharge.  HISTORY OF PRESENT ILLNESS:  Chief Complaint: Visual changes right eye HPI: Had episode last weak where he lost vision in right eye which lasted a few min. Associated with N/V and blurred vision. He has hx of EtOH and at that point stopped drinking. No EtoH for 12 days. He was seen in the interim for EtOH withdrawal symptoms and given a librium taper. Today he had another episode of the right eye and since has had two more episodes each lasting a couple of min. Currently no symptoms.  VITAL SIGNS:  Blood pressure 132/62, pulse 75, temperature 97.6 F (36.4 C), temperature source Oral, resp. rate 16, height 6' (1.829 m), weight 75.297 kg (166 lb), SpO2 96 %.  I/O:   Intake/Output Summary (Last 24 hours) at 11/15/14 1306 Last data filed at 11/14/14 1700  Gross per 24 hour  Intake      0 ml  Output      0 ml  Net      0 ml    PHYSICAL EXAMINATION:  GENERAL:  49 y.o.-year-old patient sitting up, comfortable EYES: Pupils equal, round, reactive to light and accommodation. No scleral icterus. Extraocular muscles intact.  HEENT: Head atraumatic, normocephalic. Oropharynx and nasopharynx clear.  NECK:  Supple, no jugular venous distention. No thyroid enlargement, no tenderness.  LUNGS: Normal breath sounds bilaterally, no wheezing, rales,rhonchi or crepitation. No use of accessory muscles of respiration.  CARDIOVASCULAR: S1, S2 normal. No murmurs, rubs, or gallops.  ABDOMEN: Soft, non-tender, non-distended. Bowel sounds present. No organomegaly or mass.  EXTREMITIES: No pedal edema, cyanosis, or clubbing.  NEUROLOGIC: Cranial nerves II through XII are intact. Muscle strength 5/5 in all extremities. Sensation intact. Gait not checked.   PSYCHIATRIC: The patient is alert and oriented x 3. Slightly anxious SKIN: No obvious rash, lesion, or ulcer.   DATA REVIEW:   CBC  Recent Labs Lab 11/15/14 0428  WBC 8.3  HGB 15.3  HCT 44.3  PLT 311    Chemistries   Recent Labs Lab 11/15/14 0428  NA 137  K 4.2  CL 102  CO2 29  GLUCOSE 105*  BUN 9  CREATININE 0.81  CALCIUM 9.2    Cardiac Enzymes  Recent Labs Lab 11/11/14 1609  TROPONINI <0.03    Microbiology Results  Results for orders placed or performed during the hospital encounter of 11/11/14  Culture, blood (routine x 2)     Status: None (Preliminary result)   Collection Time: 11/12/14  5:23 PM  Result Value Ref Range Status   Specimen Description BLOOD LEFT ASSIST CONTROL  Final   Special Requests   Final    BOTTLES DRAWN AEROBIC AND ANAEROBIC  AER 2CC ANA 3CC   Culture NO GROWTH 3 DAYS  Final   Report Status PENDING  Incomplete  Culture, blood (routine x 2)  Status: None (Preliminary result)   Collection Time: 11/12/14  5:23 PM  Result Value Ref Range Status   Specimen Description BLOOD RIGHT ASSIST CONTROL  Final   Special Requests BOTTLES DRAWN AEROBIC AND ANAEROBIC  10CC  Final   Culture NO GROWTH 3 DAYS  Final   Report Status PENDING  Incomplete    RADIOLOGY:  No results found.  EKG:   Orders placed or performed during the hospital encounter of 11/11/14  . ED EKG  . ED EKG  . EKG 12-Lead  . EKG 12-Lead      Management plans discussed with the patient, family and they are in agreement.  CODE STATUS:     Code Status Orders        Start     Ordered   11/11/14 1852  Full code   Continuous     11/11/14 1851      TOTAL TIME TAKING CARE OF THIS PATIENT: 35 minutes.  Greater than 50% of time spent in care coordination and counseling.  Myrtis Ser M.D on 11/15/2014 at 1:06 PM  Between 7am to 6pm - Pager - [phone]  After 6pm go to [email or web address] - password [email or web address] [facility]  [facility]  Office   [phone]  CC: Primary care physician; No PCP Per Patient

## 2014-11-15 NOTE — Progress Notes (Signed)
[place], [place].   11/15/2014  Patient: Henry Boone   Date of Birth:  27-Dec-1965  Date of admission:  11/11/2014  Date of Discharge  11/15/2014    To Whom it May Concern:   Henry Boone  may return to work on 11/17/14.  WORK-EMPLOYMENT:  Full Duty  If you have any questions or concerns, please don't hesitate to call.  Sincerely,   Myrtis Ser M.D Pager [phone] Office : [phone]   .

## 2014-11-15 NOTE — Progress Notes (Signed)
Discharge instructions given and went over with patient at bedside. Prescriptions given and in hand. All questions answered. Patient discharged home with family member via wheelchair by nursing staff. Madlyn Frankel, RN

## 2014-11-17 LAB — CULTURE, BLOOD (ROUTINE X 2)
Culture: NO GROWTH
Culture: NO GROWTH

## 2014-11-19 LAB — FACTOR 5 LEIDEN

## 2014-11-19 LAB — SICKLE CELL SCREEN: SICKLE CELL SCREEN: NEGATIVE

## 2014-11-19 LAB — PROTHROMBIN GENE MUTATION

## 2014-12-24 ENCOUNTER — Encounter: Payer: Self-pay | Admitting: Cardiovascular Disease

## 2017-07-23 ENCOUNTER — Emergency Department: Payer: Managed Care, Other (non HMO)

## 2017-07-23 ENCOUNTER — Other Ambulatory Visit: Payer: Self-pay

## 2017-07-23 ENCOUNTER — Emergency Department (HOSPITAL_COMMUNITY): Payer: Managed Care, Other (non HMO) | Admitting: Anesthesiology

## 2017-07-23 ENCOUNTER — Emergency Department (HOSPITAL_COMMUNITY): Payer: Managed Care, Other (non HMO)

## 2017-07-23 ENCOUNTER — Emergency Department
Admission: EM | Admit: 2017-07-23 | Discharge: 2017-07-23 | Disposition: A | Discharge status: Other Acute Inpatient Hospital | Payer: Managed Care, Other (non HMO) | Attending: Emergency Medicine | Admitting: Emergency Medicine

## 2017-07-23 ENCOUNTER — Inpatient Hospital Stay (HOSPITAL_COMMUNITY): Payer: Managed Care, Other (non HMO)

## 2017-07-23 ENCOUNTER — Encounter (HOSPITAL_COMMUNITY)
Admission: EM | Disposition: A | Discharge status: Home / Self Care | Payer: Self-pay | Source: Home / Self Care | Attending: Neurology

## 2017-07-23 ENCOUNTER — Encounter (HOSPITAL_COMMUNITY): Payer: Self-pay | Admitting: *Deleted

## 2017-07-23 ENCOUNTER — Inpatient Hospital Stay (HOSPITAL_COMMUNITY)
Admission: EM | Admit: 2017-07-23 | Discharge: 2017-07-25 | DRG: 065 | Disposition: A | Discharge status: Home / Self Care | Payer: Managed Care, Other (non HMO) | Attending: Neurology | Admitting: Neurology

## 2017-07-23 DIAGNOSIS — Z8673 Personal history of transient ischemic attack (TIA), and cerebral infarction without residual deficits: Secondary | ICD-10-CM | POA: Diagnosis not present

## 2017-07-23 DIAGNOSIS — E785 Hyperlipidemia, unspecified: Secondary | ICD-10-CM | POA: Diagnosis present

## 2017-07-23 DIAGNOSIS — Z7902 Long term (current) use of antithrombotics/antiplatelets: Secondary | ICD-10-CM | POA: Insufficient documentation

## 2017-07-23 DIAGNOSIS — R29707 NIHSS score 7: Secondary | ICD-10-CM | POA: Diagnosis present

## 2017-07-23 DIAGNOSIS — I10 Essential (primary) hypertension: Secondary | ICD-10-CM | POA: Diagnosis present

## 2017-07-23 DIAGNOSIS — I63139 Cerebral infarction due to embolism of unspecified carotid artery: Secondary | ICD-10-CM

## 2017-07-23 DIAGNOSIS — I639 Cerebral infarction, unspecified: Secondary | ICD-10-CM | POA: Diagnosis present

## 2017-07-23 DIAGNOSIS — Q281 Other malformations of precerebral vessels: Secondary | ICD-10-CM

## 2017-07-23 DIAGNOSIS — D72829 Elevated white blood cell count, unspecified: Secondary | ICD-10-CM | POA: Diagnosis not present

## 2017-07-23 DIAGNOSIS — F17200 Nicotine dependence, unspecified, uncomplicated: Secondary | ICD-10-CM | POA: Diagnosis present

## 2017-07-23 DIAGNOSIS — R4701 Aphasia: Secondary | ICD-10-CM | POA: Diagnosis present

## 2017-07-23 DIAGNOSIS — I6789 Other cerebrovascular disease: Secondary | ICD-10-CM | POA: Diagnosis not present

## 2017-07-23 DIAGNOSIS — I6521 Occlusion and stenosis of right carotid artery: Secondary | ICD-10-CM | POA: Diagnosis not present

## 2017-07-23 DIAGNOSIS — R29810 Facial weakness: Secondary | ICD-10-CM | POA: Diagnosis present

## 2017-07-23 DIAGNOSIS — R4781 Slurred speech: Secondary | ICD-10-CM | POA: Diagnosis not present

## 2017-07-23 DIAGNOSIS — I6523 Occlusion and stenosis of bilateral carotid arteries: Secondary | ICD-10-CM | POA: Diagnosis present

## 2017-07-23 DIAGNOSIS — I6522 Occlusion and stenosis of left carotid artery: Secondary | ICD-10-CM | POA: Diagnosis present

## 2017-07-23 DIAGNOSIS — R471 Dysarthria and anarthria: Secondary | ICD-10-CM | POA: Diagnosis not present

## 2017-07-23 DIAGNOSIS — Z7982 Long term (current) use of aspirin: Secondary | ICD-10-CM | POA: Diagnosis not present

## 2017-07-23 DIAGNOSIS — I6339 Cerebral infarction due to thrombosis of other cerebral artery: Secondary | ICD-10-CM | POA: Diagnosis not present

## 2017-07-23 DIAGNOSIS — I63513 Cerebral infarction due to unspecified occlusion or stenosis of bilateral middle cerebral arteries: Principal | ICD-10-CM | POA: Diagnosis present

## 2017-07-23 DIAGNOSIS — I63232 Cerebral infarction due to unspecified occlusion or stenosis of left carotid arteries: Secondary | ICD-10-CM

## 2017-07-23 DIAGNOSIS — Z79899 Other long term (current) drug therapy: Secondary | ICD-10-CM | POA: Diagnosis not present

## 2017-07-23 DIAGNOSIS — I636 Cerebral infarction due to cerebral venous thrombosis, nonpyogenic: Secondary | ICD-10-CM

## 2017-07-23 DIAGNOSIS — I63233 Cerebral infarction due to unspecified occlusion or stenosis of bilateral carotid arteries: Secondary | ICD-10-CM | POA: Diagnosis not present

## 2017-07-23 DIAGNOSIS — I6330 Cerebral infarction due to thrombosis of unspecified cerebral artery: Secondary | ICD-10-CM

## 2017-07-23 HISTORY — DX: Hyperlipidemia, unspecified: E78.5

## 2017-07-23 HISTORY — DX: Cerebral infarction, unspecified: I63.9

## 2017-07-23 HISTORY — PX: IR ANGIO VERTEBRAL SEL VERTEBRAL BILAT MOD SED: IMG5369

## 2017-07-23 HISTORY — DX: Essential (primary) hypertension: I10

## 2017-07-23 HISTORY — PX: IR ANGIO EXTRACRAN SEL COM CAROTID INNOMINATE UNI BILAT MOD SED: IMG5357

## 2017-07-23 LAB — CBC
HCT: 47.1 % (ref 40.0–52.0)
HEMATOCRIT: 44.7 % (ref 39.0–52.0)
HEMOGLOBIN: 15.5 g/dL (ref 13.0–18.0)
HEMOGLOBIN: 15 g/dL (ref 13.0–17.0)
MCH: 31.3 pg (ref 26.0–34.0)
MCH: 31.4 pg (ref 26.0–34.0)
MCHC: 33 g/dL (ref 32.0–36.0)
MCHC: 33.6 g/dL (ref 30.0–36.0)
MCV: 94.7 fL (ref 80.0–100.0)
MCV: 93.5 fL (ref 78.0–100.0)
PLATELETS: 307 10*3/uL (ref 150–440)
Platelets: 267 10*3/uL (ref 150–400)
RBC: 4.97 MIL/uL (ref 4.40–5.90)
RBC: 4.78 MIL/uL (ref 4.22–5.81)
RDW: 13.6 % (ref 11.5–14.5)
RDW: 13.1 % (ref 11.5–15.5)
WBC: 11.4 10*3/uL — ABNORMAL HIGH (ref 3.8–10.6)
WBC: 12.7 10*3/uL — ABNORMAL HIGH (ref 4.0–10.5)

## 2017-07-23 LAB — CREATININE, SERUM
CREATININE: 0.71 mg/dL (ref 0.61–1.24)
GFR calc Af Amer: >60 mL/min (ref >60)
GFR calc non Af Amer: >60 mL/min (ref >60)

## 2017-07-23 LAB — DIFFERENTIAL
Basophils Absolute: 0 10*3/uL (ref 0–0.1)
Basophils Relative: 0 %
EOS ABS: 0.3 10*3/uL (ref 0–0.7)
EOS PCT: 2 %
LYMPHS ABS: 2.3 10*3/uL (ref 1.0–3.6)
LYMPHS PCT: 20 %
MONO ABS: 0.9 10*3/uL (ref 0.2–1.0)
Monocytes Relative: 8 %
Neutro Abs: 8 10*3/uL — ABNORMAL HIGH (ref 1.4–6.5)
Neutrophils Relative %: 70 %

## 2017-07-23 LAB — PROTIME-INR
INR: 0.93
PROTHROMBIN TIME: 12.4 s (ref 11.4–15.2)

## 2017-07-23 LAB — COMPREHENSIVE METABOLIC PANEL
ALK PHOS: 44 U/L (ref 38–126)
ALT: 10 U/L — ABNORMAL LOW (ref 17–63)
ANION GAP: 6 (ref 5–15)
AST: 21 U/L (ref 15–41)
Albumin: 4.1 g/dL (ref 3.5–5.0)
BILIRUBIN TOTAL: 0.7 mg/dL (ref 0.3–1.2)
BUN: 6 mg/dL (ref 6–20)
CALCIUM: 8.9 mg/dL (ref 8.9–10.3)
CO2: 25 mmol/L (ref 22–32)
Chloride: 105 mmol/L (ref 101–111)
Creatinine, Ser: 0.76 mg/dL (ref 0.61–1.24)
GFR calc Af Amer: >60 mL/min (ref >60)
GFR calc non Af Amer: >60 mL/min (ref >60)
Glucose, Bld: 135 mg/dL — ABNORMAL HIGH (ref 65–99)
Potassium: 3.9 mmol/L (ref 3.5–5.1)
Sodium: 136 mmol/L (ref 135–145)
TOTAL PROTEIN: 6.7 g/dL (ref 6.5–8.1)

## 2017-07-23 LAB — ECHOCARDIOGRAM COMPLETE

## 2017-07-23 LAB — ETHANOL

## 2017-07-23 LAB — TROPONIN I

## 2017-07-23 LAB — MRSA PCR SCREENING: MRSA BY PCR: POSITIVE — AB

## 2017-07-23 LAB — APTT: aPTT: 30 seconds (ref 24–36)

## 2017-07-23 LAB — GLUCOSE, CAPILLARY: GLUCOSE-CAPILLARY: 130 mg/dL — AB (ref 65–99)

## 2017-07-23 SURGERY — RADIOLOGY WITH ANESTHESIA
Anesthesia: Monitor Anesthesia Care

## 2017-07-23 MED ORDER — SODIUM CHLORIDE 0.9 % IV SOLN
INTRAVENOUS | Status: DC | PRN
  Administered 2017-07-23: 08:00:00 via INTRAVENOUS

## 2017-07-23 MED ORDER — ENOXAPARIN SODIUM 40 MG/0.4ML ~~LOC~~ SOLN
40.0000 mg | SUBCUTANEOUS | Status: DC
  Administered 2017-07-23 – 2017-07-25 (×3): 40 mg via SUBCUTANEOUS
  Filled 2017-07-23 (×4): qty 0.4

## 2017-07-23 MED ORDER — SODIUM CHLORIDE 0.9 % IV SOLN
INTRAVENOUS | Status: DC
  Administered 2017-07-23: 18:00:00 via INTRAVENOUS

## 2017-07-23 MED ORDER — SODIUM CHLORIDE 0.9 % IV SOLN: INTRAVENOUS | Status: DC

## 2017-07-23 MED ORDER — LIDOCAINE HCL 1 % IJ SOLN
INTRAMUSCULAR | Status: AC
  Filled 2017-07-23: qty 20

## 2017-07-23 MED ORDER — MUPIROCIN 2 % EX OINT
1.0000 "application " | TOPICAL_OINTMENT | Freq: Two times a day (BID) | CUTANEOUS | Status: DC
  Administered 2017-07-23 – 2017-07-25 (×4): 1 via NASAL
  Filled 2017-07-23 (×2): qty 22

## 2017-07-23 MED ORDER — CHLORHEXIDINE GLUCONATE CLOTH 2 % EX PADS
6.0000 | MEDICATED_PAD | Freq: Every day | CUTANEOUS | Status: DC
  Administered 2017-07-24 – 2017-07-25 (×2): 6 via TOPICAL

## 2017-07-23 MED ORDER — SENNOSIDES-DOCUSATE SODIUM 8.6-50 MG PO TABS: 1.0000 | ORAL_TABLET | Freq: Every evening | ORAL | Status: DC | PRN

## 2017-07-23 MED ORDER — RESOURCE THICKENUP CLEAR PO POWD
ORAL | Status: DC | PRN
  Filled 2017-07-23: qty 125

## 2017-07-23 MED ORDER — CEFAZOLIN SODIUM-DEXTROSE 2-4 GM/100ML-% IV SOLN
INTRAVENOUS | Status: AC
  Filled 2017-07-23: qty 100

## 2017-07-23 MED ORDER — LIDOCAINE HCL 1 % IJ SOLN
INTRAMUSCULAR | Status: AC | PRN
  Administered 2017-07-23: 10 mL

## 2017-07-23 MED ORDER — MIDAZOLAM HCL 5 MG/5ML IJ SOLN
INTRAMUSCULAR | Status: DC | PRN
  Administered 2017-07-23: 1 mg via INTRAVENOUS

## 2017-07-23 MED ORDER — FENTANYL CITRATE (PF) 100 MCG/2ML IJ SOLN
INTRAMUSCULAR | Status: DC | PRN
  Administered 2017-07-23: 25 ug via INTRAVENOUS

## 2017-07-23 MED ORDER — ACETAMINOPHEN 160 MG/5ML PO SOLN: 650.0000 mg | ORAL | Status: DC | PRN

## 2017-07-23 MED ORDER — MIDAZOLAM HCL 2 MG/2ML IJ SOLN
INTRAMUSCULAR | Status: AC
  Filled 2017-07-23: qty 2

## 2017-07-23 MED ORDER — IOPAMIDOL (ISOVUE-370) INJECTION 76%
100.0000 mL | Freq: Once | INTRAVENOUS | Status: AC | PRN
  Administered 2017-07-23: 100 mL via INTRAVENOUS

## 2017-07-23 MED ORDER — STROKE: EARLY STAGES OF RECOVERY BOOK
Freq: Once | Status: AC
  Administered 2017-07-23: 12:00:00
  Filled 2017-07-23: qty 1

## 2017-07-23 MED ORDER — NITROGLYCERIN 1 MG/10 ML FOR IR/CATH LAB
INTRA_ARTERIAL | Status: AC
  Filled 2017-07-23: qty 10

## 2017-07-23 MED ORDER — PRAVASTATIN SODIUM 40 MG PO TABS
40.0000 mg | ORAL_TABLET | Freq: Every day | ORAL | Status: DC
  Administered 2017-07-23 – 2017-07-24 (×2): 40 mg via ORAL
  Filled 2017-07-23 (×2): qty 1

## 2017-07-23 MED ORDER — ACETAMINOPHEN 325 MG PO TABS: 650.0000 mg | ORAL_TABLET | ORAL | Status: DC | PRN

## 2017-07-23 MED ORDER — IOPAMIDOL (ISOVUE-300) INJECTION 61%
INTRAVENOUS | Status: AC
  Administered 2017-07-23: 15 mL
  Filled 2017-07-23: qty 50

## 2017-07-23 MED ORDER — CLOPIDOGREL BISULFATE 75 MG PO TABS
75.0000 mg | ORAL_TABLET | Freq: Every day | ORAL | Status: DC
  Administered 2017-07-24 – 2017-07-25 (×2): 75 mg via ORAL
  Filled 2017-07-23 (×3): qty 1

## 2017-07-23 MED ORDER — LABETALOL HCL 5 MG/ML IV SOLN
INTRAVENOUS | Status: DC | PRN
  Administered 2017-07-23: 5 mg via INTRAVENOUS

## 2017-07-23 MED ORDER — IOPAMIDOL (ISOVUE-300) INJECTION 61%
INTRAVENOUS | Status: AC
  Administered 2017-07-23: 75 mL
  Filled 2017-07-23: qty 150

## 2017-07-23 MED ORDER — FENTANYL CITRATE (PF) 100 MCG/2ML IJ SOLN
INTRAMUSCULAR | Status: AC
  Filled 2017-07-23: qty 2

## 2017-07-23 MED ORDER — SODIUM CHLORIDE 0.9 % IV SOLN
100.0000 mL/h | INTRAVENOUS | Status: DC
  Administered 2017-07-23: 100 mL/h via INTRAVENOUS

## 2017-07-23 MED ORDER — LIDOCAINE HCL 1 % IJ SOLN
INTRAMUSCULAR | Status: AC | PRN
  Administered 2017-07-23: 15 mL

## 2017-07-23 MED ORDER — ACETAMINOPHEN 650 MG RE SUPP: 650.0000 mg | RECTAL | Status: DC | PRN

## 2017-07-23 MED ORDER — SODIUM CHLORIDE 0.9 % IV BOLUS
500.0000 mL | Freq: Once | INTRAVENOUS | Status: AC
  Administered 2017-07-23: 500 mL via INTRAVENOUS

## 2017-07-23 MED ORDER — ASPIRIN 81 MG PO CHEW
324.0000 mg | CHEWABLE_TABLET | Freq: Every day | ORAL | Status: DC
  Administered 2017-07-24: 324 mg via ORAL
  Filled 2017-07-23 (×2): qty 4

## 2017-07-23 NOTE — ED Notes (Addendum)
Pt able to write on paper, pt writes answers to questions correctly - pt left handed

## 2017-07-23 NOTE — ED Notes (Signed)
Code  stroke  called  to 333 

## 2017-07-23 NOTE — Anesthesia Procedure Notes (Signed)
Procedure Name: MAC Date/Time: 07/23/2017 8:30 AM Performed by: Mariea Clonts, CRNA Pre-anesthesia Checklist: Patient identified, Emergency Drugs available, Suction available, Patient being monitored and Timeout performed Patient Re-evaluated:Patient Re-evaluated prior to induction Oxygen Delivery Method: Nasal cannula

## 2017-07-23 NOTE — Procedures (Addendum)
S/P 4vessel cerebral arteriogram. Rt CFA approach. Findings. 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 2.Bilateral hypoplastic vert arteries.

## 2017-07-23 NOTE — ED Provider Notes (Signed)
[facility] Emergency Department Provider Note   ____________________________________________   First MD Initiated Contact with Patient 07/23/17 [id number]     (approximate)  I have reviewed the triage vital signs and the nursing notes.   HISTORY  Chief Complaint Aphasia and Numbness    HPI Henry Boone is a 52 y.o. male who comes into the hospital today with some slurred speech and facial weakness.  The patient went to bed around 930 last night.  He woke up to use the restroom and felt like he was in a dream.  The patient laying down to speak to his cat and realized that he was unable to speak.  His speech was slurred and his girlfriend states that he was coughing.  He felt well yesterday but did not eat dinner last night which is not uncommon for him.  The patient states that he does not feel any numbness but does feel some mild weakness on the right.  He has a history of high blood pressure and has been taking his medications.  The patient is here today for evaluation.   Past Medical History:  Diagnosis Date  . ETOH abuse   . Hyperlipidemia   . Hypertension   . Stroke [facility])     Patient Active Problem List   Diagnosis Date Noted  . Cerebral thrombosis with cerebral infarction ([place]) 11/12/2014  . CVA (cerebral infarction) 11/12/2014  . Amaurosis fugax 11/11/2014    Past Surgical History:  Procedure Laterality Date  . TEE WITHOUT CARDIOVERSION N/A 11/15/2014   Procedure: TRANSESOPHAGEAL ECHOCARDIOGRAM (TEE);  Surgeon: Minna Merritts, MD;  Location: [facility] ORS;  Service: Cardiovascular;  Laterality: N/A;    Prior to Admission medications   Medication Sig Start Date End Date Taking? Authorizing Provider  aspirin EC 325 MG EC tablet Take 1 tablet (325 mg total) by mouth daily. 11/15/14   Aldean Jewett, MD  Multiple Vitamin (MULTIVITAMIN WITH MINERALS) TABS tablet Take 1 tablet by mouth daily. 11/15/14   Aldean Jewett, MD  pravastatin (PRAVACHOL) 40 MG  tablet Take 1 tablet (40 mg total) by mouth daily. 11/15/14   Aldean Jewett, MD    Allergies Patient has no known allergies.  No family history on file.  Social History Social History   Tobacco Use  . Smoking status: Current Every Day Smoker  Substance Use Topics  . Alcohol use: Yes  . Drug use: Not on file    Review of Systems  Constitutional: No fever/chills Eyes: No visual changes. ENT: No sore throat. Cardiovascular: Denies chest pain. Respiratory: Denies shortness of breath. Gastrointestinal: No abdominal pain.  No nausea, no vomiting.   Genitourinary: Negative for dysuria. Musculoskeletal: Negative for back pain. Skin: Negative for rash. Neurological: slurred speech and facial droop   ____________________________________________   PHYSICAL EXAM:  VITAL SIGNS: ED Triage Vitals  Enc Vitals Group     BP 07/23/17 0604 (!) 168/100     Pulse Rate 07/23/17 0604 (!) 128     Resp 07/23/17 0604 20     Temp 07/23/17 0604 98.4 F (36.9 C)     Temp Source 07/23/17 0604 Oral     SpO2 07/23/17 0604 97 %     Weight --      Height --      Head Circumference --      Peak Flow --      Pain Score 07/23/17 0639 0     Pain Loc --  Pain Edu? --      Excl. in [place]? --     Constitutional: Alert and oriented. Ill appearing and in moderate distress. Eyes: Conjunctivae are normal. PERRL. EOMI. Head: Atraumatic. Nose: No congestion/rhinnorhea. Mouth/Throat: Mucous membranes are moist.  Oropharynx non-erythematous. Cardiovascular: Normal rate, regular rhythm. Grossly normal heart sounds.  Good peripheral circulation. Respiratory: Normal respiratory effort.  No retractions. Lungs CTAB. Gastrointestinal: Soft and nontender. No distention. Positive bowel sounds Musculoskeletal: No lower extremity tenderness nor edema.   Neurologic: Patient a phasic and dysarthric, right-sided facial paralysis with minimal movement of right eyebrow and right sided lip droop.  Some mild  right-sided tongue deviation, shoulder strength intact extraocular muscles intact.  Mildly decreased grip on the right with some mild right-sided pronator drift and dysmetria on finger to nose.  Sensation intact and no drift to the lower extremities.  Patient unable to repeat words but can identify a watch and a pen. Skin:  Skin is warm, dry and intact.  Psychiatric: Mood and affect are normal.   ____________________________________________   LABS (all labs ordered are listed, but only abnormal results are displayed)  Labs Reviewed  CBC - Abnormal; Notable for the following components:      Result Value   WBC 11.4 (*)    All other components within normal limits  DIFFERENTIAL - Abnormal; Notable for the following components:   Neutro Abs 8.0 (*)    All other components within normal limits  COMPREHENSIVE METABOLIC PANEL - Abnormal; Notable for the following components:   Glucose, Bld 135 (*)    ALT 10 (*)    All other components within normal limits  PROTIME-INR  APTT  TROPONIN I  URINE DRUG SCREEN, QUALITATIVE ([facility] ONLY)  URINALYSIS, ROUTINE W REFLEX MICROSCOPIC  ETHANOL   ____________________________________________  EKG  ED ECG REPORT I, Loney Hering, the attending physician, personally viewed and interpreted this ECG.   Date: 07/23/2017  EKG Time: 0608  Rate: 129  Rhythm: sinus tachycardia  Axis: right axis  Intervals:none  ST&T Change: none  ____________________________________________  RADIOLOGY  ED MD interpretation:  CT head: No acute intracranial abnormality identified, left M2 superior division dense vessel may represent a thrombus, right larger than left parietal lobe chronic infarctions  CT perfusion, CTA head and neck: Acute infarction in the left posterior frontal brain with region of CBF less than 30% patient would be candidate for consideration of embolectomy.  M2-3 branch inclusion  Official radiology report(s): Ct Angio Head W Or Wo  Contrast  Result Date: 07/23/2017 CLINICAL DATA:  Acute presentation with slurred speech and bilateral arm numbness upon wakening at 0530 hours. Speech disturbance. 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 Question hyperdense left MCA branch. EXAM: CT ANGIOGRAPHY HEAD AND NECK CT PERFUSION BRAIN TECHNIQUE: Multidetector CT imaging of the head and neck was performed using the standard protocol during bolus administration of intravenous contrast. Multiplanar CT image reconstructions and MIPs were obtained to evaluate the vascular anatomy. Carotid stenosis measurements (when applicable) are obtained utilizing NASCET criteria, using the distal internal carotid diameter as the denominator. Multiphase CT imaging of the brain was performed following IV bolus contrast injection. Subsequent parametric perfusion maps were calculated using RAPID software. CONTRAST:  160mL ISOVUE-370 IOPAMIDOL (ISOVUE-370) INJECTION 76% COMPARISON:  CT earlier same day.  MRI 11/11/2014. FINDINGS: CTA NECK FINDINGS Aortic arch: Minimal aortic atherosclerosis. No aneurysm or dissection. Branching pattern of the brachiocephalic vessels from the arch is normal with mild atherosclerotic change but no stenosis.  Right carotid system: Common carotid artery shows some plaque along its course to the bifurcation but no stenosis or irregularity. Right ICA bifurcation shows soft and calcified plaque of the ICA bulb but without stenosis greater than diameter of the more distal cervical ICA. Left carotid system: Common carotid artery shows some plaque along its course to the bifurcation but no stenosis or irregularity. At the carotid bifurcation, there is atherosclerotic plaque both soft and calcified. Minimal diameter of the distal bulb is 3.7 mm. Compared to an expected distal cervical ICA diameter of 4 mm, this indicates a 10-20% stenosis. There is focus soft plaque affecting the cervical ICA at the C1 level which narrows the luminal  diameter to 2.4 mm, consistent with a 40% stenosis. I do not see obvious ulceration. Vertebral arteries: Left vertebral artery origin is low but widely patent. There is calcified plaque at the right vertebral artery origin with stenosis estimated at 30-50%. Both vertebral arteries are small but otherwise patent through the cervical region to the foramen magnum. Skeleton: Mid cervical spondylosis. Other neck: No mass or lymphadenopathy. Possible posterior nasal polyp. Upper chest: Advanced emphysema. Review of the MIP images confirms the above findings CTA HEAD FINDINGS Anterior circulation: Both internal carotid arteries are patent through the skull base and siphon regions. There is advanced atherosclerotic disease in the carotid siphon regions with narrowing and irregularity. This is particularly severe on the left where the lumen appears barely patent. Additionally, there is a patent trigeminal artery on the left which is self shows atherosclerotic change in stenosis, giving the majority supply to the basilar tip. Right supraclinoid ICA regains a normal caliber. There is fetal origin of the right PCA. Right anterior and middle cerebral vessels are widely patent presently. On the left, supraclinoid ICA shows 50-70% stenosis. There is flow in the left anterior and middle cerebral vessels. M3 branch vessel occlusion in the region of the left posterior frontal brain. Posterior circulation: Both vertebral arteries are patent through the foramen magnum, supplying both picas. There is a tiny basilar artery which is patent to the level where the patent trigeminal artery supplies the distal basilar. Superior cerebellar and posterior cerebral vessels are patent, with fetal origin of the right PCA as noted above. Venous sinuses: Patent and normal. Anatomic variants: See above for discussion of left trigeminal artery. Delayed phase: No abnormal enhancement. Review of the MIP images confirms the above findings CT Brain  Perfusion Findings: CBF (<30%) Volume: 54mL Perfusion (Tmax>6.0s) volume: 40 formL Mismatch Volume: 53mL Infarction Location:Left posterior frontal brain IMPRESSION: Acute infarction in the left posterior frontal brain with region of CBF less than 30% measured at 21 cc and T-max greater than 6 seconds at 44 cc for a 23 cc region of at risk brain. Given this, the patient would be a candidate for consideration of embolectomy. M2-3 branch inclusion, though difficult to identify as pre CT a perfusion imaging causes vascular opacification. Nonstenotic atherosclerotic change at both carotid bifurcations. Focal soft plaque of the upper cervical ICA on the left at the C1 level with stenosis of 40% but no visible ulceration. Very severe atherosclerotic change of both carotid siphon regions with severe stenosis and irregularity. This could serve as a challenge for embolectomy. 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 These results were called by telephone at the time of interpretation on 07/23/2017 at 7:20 am to Dr. Charlesetta Ivory , who verbally acknowledged these  results. Electronically Signed   By: Nelson Chimes M.D.   On: 07/23/2017 07:42   Ct Head Wo Contrast  Result Date: 07/23/2017 CLINICAL DATA:  52 y/o  M; slurred speech and right facial droop. EXAM: CT HEAD WITHOUT CONTRAST TECHNIQUE: Contiguous axial images were obtained from the base of the skull through the vertex without intravenous contrast. COMPARISON:  11/11/2014 CT and MRI of the head. FINDINGS: Brain: Moderate right parietal and small left parietal chronic infarctions. No acute infarct, hemorrhage, or focal mass effect of the brain identified. No extra-axial collection, hydrocephalus, effacement of basilar cisterns, or herniation. Vascular: Calcific atherosclerosis of carotid siphons. Density within the proximal left M2 superior division (series 4,  image 29). Skull: Normal. Negative for fracture or focal lesion. Sinuses/Orbits: Maxillary sinus mucosal thickening. Otherwise normally aerated paranasal sinuses and mastoid air cells. Orbits are unremarkable. Other: None. IMPRESSION: 1. No acute intracranial abnormality identified.  ASPECTS is 10. 2. Left M2 superior division dense vessel may represent a thrombus. CTA of the head recommended. 3. Right larger than left parietal lobe chronic infarctions. These results were called by telephone at the time of interpretation on 07/23/2017 at 6:27 am to Dr. Dahlia Client, who verbally acknowledged these results. Electronically Signed   By: Kristine Garbe M.D.   On: 07/23/2017 06:33   Ct Angio Neck W Or Wo Contrast  Result Date: 07/23/2017 CLINICAL DATA:  Acute presentation with slurred speech and bilateral arm numbness upon wakening at 0530 hours. Speech disturbance. 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 Question hyperdense left MCA branch. EXAM: CT ANGIOGRAPHY HEAD AND NECK CT PERFUSION BRAIN TECHNIQUE: Multidetector CT imaging of the head and neck was performed using the standard protocol during bolus administration of intravenous contrast. Multiplanar CT image reconstructions and MIPs were obtained to evaluate the vascular anatomy. Carotid stenosis measurements (when applicable) are obtained utilizing NASCET criteria, using the distal internal carotid diameter as the denominator. Multiphase CT imaging of the brain was performed following IV bolus contrast injection. Subsequent parametric perfusion maps were calculated using RAPID software. CONTRAST:  129mL ISOVUE-370 IOPAMIDOL (ISOVUE-370) INJECTION 76% COMPARISON:  CT earlier same day.  MRI 11/11/2014. FINDINGS: CTA NECK FINDINGS Aortic arch: Minimal aortic atherosclerosis. No aneurysm or dissection. Branching pattern of the brachiocephalic vessels from the arch is normal with mild atherosclerotic change but no stenosis. Right carotid system:  Common carotid artery shows some plaque along its course to the bifurcation but no stenosis or irregularity. Right ICA bifurcation shows soft and calcified plaque of the ICA bulb but without stenosis greater than diameter of the more distal cervical ICA. Left carotid system: Common carotid artery shows some plaque along its course to the bifurcation but no stenosis or irregularity. At the carotid bifurcation, there is atherosclerotic plaque both soft and calcified. Minimal diameter of the distal bulb is 3.7 mm. Compared to an expected distal cervical ICA diameter of 4 mm, this indicates a 10-20% stenosis. There is focus soft plaque affecting the cervical ICA at the C1 level which narrows the luminal diameter to 2.4 mm, consistent with a 40% stenosis. I do not see obvious ulceration. Vertebral arteries: Left vertebral artery origin is low but widely patent. There is calcified plaque at the right vertebral artery origin with stenosis estimated at 30-50%. Both vertebral arteries are small but otherwise patent through the cervical region to the foramen magnum. Skeleton: Mid cervical spondylosis. Other neck: No mass or lymphadenopathy. Possible posterior nasal polyp. Upper chest: Advanced emphysema. Review of the MIP images confirms  the above findings CTA HEAD FINDINGS Anterior circulation: Both internal carotid arteries are patent through the skull base and siphon regions. There is advanced atherosclerotic disease in the carotid siphon regions with narrowing and irregularity. This is particularly severe on the left where the lumen appears barely patent. Additionally, there is a patent trigeminal artery on the left which is self shows atherosclerotic change in stenosis, giving the majority supply to the basilar tip. Right supraclinoid ICA regains a normal caliber. There is fetal origin of the right PCA. Right anterior and middle cerebral vessels are widely patent presently. On the left, supraclinoid ICA shows 50-70%  stenosis. There is flow in the left anterior and middle cerebral vessels. M3 branch vessel occlusion in the region of the left posterior frontal brain. Posterior circulation: Both vertebral arteries are patent through the foramen magnum, supplying both picas. There is a tiny basilar artery which is patent to the level where the patent trigeminal artery supplies the distal basilar. Superior cerebellar and posterior cerebral vessels are patent, with fetal origin of the right PCA as noted above. Venous sinuses: Patent and normal. Anatomic variants: See above for discussion of left trigeminal artery. Delayed phase: No abnormal enhancement. Review of the MIP images confirms the above findings CT Brain Perfusion Findings: CBF (<30%) Volume: 63mL Perfusion (Tmax>6.0s) volume: 40 formL Mismatch Volume: 52mL Infarction Location:Left posterior frontal brain IMPRESSION: Acute infarction in the left posterior frontal brain with region of CBF less than 30% measured at 21 cc and T-max greater than 6 seconds at 44 cc for a 23 cc region of at risk brain. Given this, the patient would be a candidate for consideration of embolectomy. M2-3 branch inclusion, though difficult to identify as pre CT a perfusion imaging causes vascular opacification. Nonstenotic atherosclerotic change at both carotid bifurcations. Focal soft plaque of the upper cervical ICA on the left at the C1 level with stenosis of 40% but no visible ulceration. Very severe atherosclerotic change of both carotid siphon regions with severe stenosis and irregularity. This could serve as a challenge for embolectomy. 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 These results were called by telephone at the time of interpretation on 07/23/2017 at 7:20 am to Dr. Charlesetta Ivory , who verbally acknowledged these results. Electronically Signed   By: Nelson Chimes M.D.   On:  07/23/2017 07:42   Ct Cerebral Perfusion W Contrast  Result Date: 07/23/2017 CLINICAL DATA:  Acute presentation with slurred speech and bilateral arm numbness upon wakening at 0530 hours. Speech disturbance. 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 Question hyperdense left MCA branch. EXAM: CT ANGIOGRAPHY HEAD AND NECK CT PERFUSION BRAIN TECHNIQUE: Multidetector CT imaging of the head and neck was performed using the standard protocol during bolus administration of intravenous contrast. Multiplanar CT image reconstructions and MIPs were obtained to evaluate the vascular anatomy. Carotid stenosis measurements (when applicable) are obtained utilizing NASCET criteria, using the distal internal carotid diameter as the denominator. Multiphase CT imaging of the brain was performed following IV bolus contrast injection. Subsequent parametric perfusion maps were calculated using RAPID software. CONTRAST:  126mL ISOVUE-370 IOPAMIDOL (ISOVUE-370) INJECTION 76% COMPARISON:  CT earlier same day.  MRI 11/11/2014. FINDINGS: CTA NECK FINDINGS Aortic arch: Minimal aortic atherosclerosis. No aneurysm or dissection. Branching pattern of the brachiocephalic vessels from the arch is normal with mild atherosclerotic change but no stenosis. Right carotid system: Common carotid artery shows some plaque  along its course to the bifurcation but no stenosis or irregularity. Right ICA bifurcation shows soft and calcified plaque of the ICA bulb but without stenosis greater than diameter of the more distal cervical ICA. Left carotid system: Common carotid artery shows some plaque along its course to the bifurcation but no stenosis or irregularity. At the carotid bifurcation, there is atherosclerotic plaque both soft and calcified. Minimal diameter of the distal bulb is 3.7 mm. Compared to an expected distal cervical ICA diameter of 4 mm, this indicates a 10-20% stenosis. There is focus soft plaque affecting the cervical ICA at  the C1 level which narrows the luminal diameter to 2.4 mm, consistent with a 40% stenosis. I do not see obvious ulceration. Vertebral arteries: Left vertebral artery origin is low but widely patent. There is calcified plaque at the right vertebral artery origin with stenosis estimated at 30-50%. Both vertebral arteries are small but otherwise patent through the cervical region to the foramen magnum. Skeleton: Mid cervical spondylosis. Other neck: No mass or lymphadenopathy. Possible posterior nasal polyp. Upper chest: Advanced emphysema. Review of the MIP images confirms the above findings CTA HEAD FINDINGS Anterior circulation: Both internal carotid arteries are patent through the skull base and siphon regions. There is advanced atherosclerotic disease in the carotid siphon regions with narrowing and irregularity. This is particularly severe on the left where the lumen appears barely patent. Additionally, there is a patent trigeminal artery on the left which is self shows atherosclerotic change in stenosis, giving the majority supply to the basilar tip. Right supraclinoid ICA regains a normal caliber. There is fetal origin of the right PCA. Right anterior and middle cerebral vessels are widely patent presently. On the left, supraclinoid ICA shows 50-70% stenosis. There is flow in the left anterior and middle cerebral vessels. M3 branch vessel occlusion in the region of the left posterior frontal brain. Posterior circulation: Both vertebral arteries are patent through the foramen magnum, supplying both picas. There is a tiny basilar artery which is patent to the level where the patent trigeminal artery supplies the distal basilar. Superior cerebellar and posterior cerebral vessels are patent, with fetal origin of the right PCA as noted above. Venous sinuses: Patent and normal. Anatomic variants: See above for discussion of left trigeminal artery. Delayed phase: No abnormal enhancement. Review of the MIP images  confirms the above findings CT Brain Perfusion Findings: CBF (<30%) Volume: 71mL Perfusion (Tmax>6.0s) volume: 40 formL Mismatch Volume: 22mL Infarction Location:Left posterior frontal brain IMPRESSION: Acute infarction in the left posterior frontal brain with region of CBF less than 30% measured at 21 cc and T-max greater than 6 seconds at 44 cc for a 23 cc region of at risk brain. Given this, the patient would be a candidate for consideration of embolectomy. M2-3 branch inclusion, though difficult to identify as pre CT a perfusion imaging causes vascular opacification. Nonstenotic atherosclerotic change at both carotid bifurcations. Focal soft plaque of the upper cervical ICA on the left at the C1 level with stenosis of 40% but no visible ulceration. Very severe atherosclerotic change of both carotid siphon regions with severe stenosis and irregularity. This could serve as a challenge for embolectomy. 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 These results were called by telephone at the time of interpretation on 07/23/2017 at 7:20 am to Dr. Charlesetta Ivory , who verbally acknowledged these results. Electronically Signed   By: Nelson Chimes  M.D.   On: 07/23/2017 07:42    ____________________________________________   PROCEDURES  Procedure(s) performed: please, see procedure note(s).  .Critical Care Performed by: Loney Hering, MD Authorized by: Loney Hering, MD   Critical care provider statement:    Critical care time (minutes):  30   Critical care start time:  07/23/2017 6:18 AM   Critical care end time:  07/23/2017 7:30 AM   Critical care time was exclusive of:  Separately billable procedures and treating other patients   Critical care was necessary to treat or prevent imminent or life-threatening deterioration of the following conditions: stroke.   Critical care was time spent  personally by me on the following activities:  Development of treatment plan with patient or surrogate, discussions with consultants, obtaining history from patient or surrogate, ordering and performing treatments and interventions, ordering and review of laboratory studies, ordering and review of radiographic studies, pulse oximetry, re-evaluation of patient's condition, review of old charts and examination of patient   I assumed direction of critical care for this patient from another provider in my specialty: no      Critical Care performed: Yes, see critical care note(s)  ____________________________________________   INITIAL IMPRESSION / ASSESSMENT AND PLAN / ED COURSE  As part of my medical decision making, I reviewed the following data within the electronic MEDICAL RECORD NUMBER History obtained from family and A consult was requested and obtained from this/these consultant(s) Neurology   This is a 52 year old male who comes into the hospital today with some right-sided facial weakness and slurred speech concerning for an acute stroke.  The patient was last seen normal at 930 so at this time he is outside of the window for TPA.  After examining the patient he appeared to have an NIH stroke scale of 11.  We did order a telemetry neurology consult to evaluate the patient but he did receive a CT scan that showed a dense segment of his M2 with a concern for a large vessel occlusion.  The neurologist did she see the patient but after speaking with radiology I did contact [facility] to transfer the patient for neuro endovascular radiology.  We did leave the patient's blood pressure to allow for some permissive hypertension but he is also tachycardic.  He was ordered some normal saline.  We will perform a swallow study and determine if the patient may take some aspirin.  The patient will be transferred to [facility] for intervention regarding the his stroke.       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    Loney Hering, MD 07/23/17 [phone]

## 2017-07-23 NOTE — Anesthesia Postprocedure Evaluation (Signed)
Anesthesia Post Note  Patient: Henry Boone  Procedure(s) Performed: RADIOLOGY WITH ANESTHESIA (N/A )     Patient location during evaluation: ICU Anesthesia Type: MAC Level of consciousness: awake and alert Pain management: pain level controlled Vital Signs Assessment: post-procedure vital signs reviewed and stable Respiratory status: spontaneous breathing, nonlabored ventilation, respiratory function stable and patient connected to nasal cannula oxygen Cardiovascular status: stable and blood pressure returned to baseline Postop Assessment: no apparent nausea or vomiting Anesthetic complications: no    Last Vitals:  Vitals:   07/23/17 1700 07/23/17 1800  BP: (!) 153/84 (!) 164/94  Pulse: 80 95  Resp: 19 (!) 21  Temp:    SpO2: 96% 99%    Last Pain:  Vitals:   07/23/17 1700  TempSrc:   PainSc: 0-No pain                 Barnet Glasgow

## 2017-07-23 NOTE — Progress Notes (Signed)
CODE STROKE- PHARMACY COMMUNICATION   Time CODE STROKE called/page received: [phone]  Time response to CODE STROKE was made (in person or via phone):   Time Stroke Kit retrieved from [place] (only if needed): n/a  Name of Provider/Nurse contacted: [place] Miners  Past Medical History:  Diagnosis Date  . ETOH abuse    Prior to Admission medications   Medication Sig Start Date End Date Taking? Authorizing Provider  aspirin EC 325 MG EC tablet Take 1 tablet (325 mg total) by mouth daily. 11/15/14   Aldean Jewett, MD  Multiple Vitamin (MULTIVITAMIN WITH MINERALS) TABS tablet Take 1 tablet by mouth daily. 11/15/14   Aldean Jewett, MD  pravastatin (PRAVACHOL) 40 MG tablet Take 1 tablet (40 mg total) by mouth daily. 11/15/14   Aldean Jewett, MD    Tobie Lords ,PharmD Clinical Pharmacist  07/23/2017  7:14 AM

## 2017-07-23 NOTE — Code Documentation (Signed)
52 yo Male coming from [facility] via [place] for IR. Pt is alert and oriented upon arrival to [facility]. NIHSS 7 upon arrival due to right side facial palsy, dysarthria, severe aphasia, and missed question upon assessing patient. Placed in IR from [place]. Handoff given to IR.

## 2017-07-23 NOTE — Sedation Documentation (Signed)
Sheath removed v pad applied

## 2017-07-23 NOTE — ED Notes (Signed)
EMTALA reviewed. 

## 2017-07-23 NOTE — Transfer of Care (Signed)
Immediate Anesthesia Transfer of Care Note  Patient: Henry Boone  Procedure(s) Performed: RADIOLOGY WITH ANESTHESIA (N/A )  Patient Location: NICU  Anesthesia Type:MAC  Level of Consciousness: awake, alert  and oriented  Airway & Oxygen Therapy: Patient Spontanous Breathing  Post-op Assessment: Report given to RN  Post vital signs: Reviewed and stable  Last Vitals:  Vitals Value Taken Time  BP    Temp    Pulse 93 07/23/2017 10:25 AM  Resp 20 07/23/2017 10:25 AM  SpO2 98 % 07/23/2017 10:25 AM  Vitals shown include unvalidated device data.  Last Pain:  Vitals:   07/23/17 1000  TempSrc: Oral         Complications: No apparent anesthesia complications

## 2017-07-23 NOTE — Anesthesia Preprocedure Evaluation (Signed)
Anesthesia Evaluation    Reviewed: Allergy & Precautions, Patient's Chart, lab work & pertinent test resultsPreop documentation limited or incomplete due to emergent nature of procedure.  Airway        Dental   Pulmonary Current Smoker,           Cardiovascular hypertension,      Neuro/Psych CVA    GI/Hepatic   Endo/Other    Renal/GU      Musculoskeletal   Abdominal   Peds  Hematology   Anesthesia Other Findings   Reproductive/Obstetrics                             Anesthesia Physical Anesthesia Plan  ASA: III and emergent  Anesthesia Plan: MAC   Post-op Pain Management:    Induction: Intravenous  PONV Risk Score and Plan: Treatment may vary due to age or medical condition  Airway Management Planned: Mask, Natural Airway and Nasal Cannula  Additional Equipment:   Intra-op Plan:   Post-operative Plan:   Informed Consent: I have reviewed the patients History and Physical, chart, labs and discussed the procedure including the risks, benefits and alternatives for the proposed anesthesia with the patient or authorized representative who has indicated his/her understanding and acceptance.     Plan Discussed with:   Anesthesia Plan Comments:         Anesthesia Quick Evaluation

## 2017-07-23 NOTE — Progress Notes (Signed)
Chaplain responded to a code stroke. Pt was seated up with spouse at bedside. Family said they had no needs. Chaplain prayed for staff, patient and family.    07/23/17 0700  Clinical Encounter Type  Visited With Patient and family together  Visit Type Initial  Referral From Nurse  Spiritual Encounters  Spiritual Needs Prayer

## 2017-07-23 NOTE — ED Notes (Addendum)
Per pt, pt woke up at 0530 from a dream and went to talk to his cats. Pt states he realized he was not able to talk to cats due to not being able to make words. Pt reports hx of HTN, CVAx2, current smoker. Pt trying to talk at this time, able to state yes or no. Pt gets frustrated when not able to vocalize words. Garbled speech noted at this time. Pt maintaining control of saliva, equal and unlabored resp.

## 2017-07-23 NOTE — Evaluation (Signed)
Speech Language Pathology Evaluation Patient Details Name: Henry Boone MRN: [id number] DOB: 1966/03/29 Today's Date: 07/23/2017 Time: 9563-8756 SLP Time Calculation (min) (ACUTE ONLY): 20 min  Problem List:  Patient Active Problem List   Diagnosis Date Noted  . Stroke (cerebrum) ([place]) 07/23/2017  . Cerebral thrombosis with cerebral infarction ([place]) 11/12/2014  . CVA (cerebral infarction) 11/12/2014  . Amaurosis fugax 11/11/2014   Past Medical History:  Past Medical History:  Diagnosis Date  . ETOH abuse   . Hyperlipidemia   . Hypertension   . Stroke [facility])    Past Surgical History:  Past Surgical History:  Procedure Laterality Date  . TEE WITHOUT CARDIOVERSION N/A 11/15/2014   Procedure: TRANSESOPHAGEAL ECHOCARDIOGRAM (TEE);  Surgeon: Minna Merritts, MD;  Location: [facility] ORS;  Service: Cardiovascular;  Laterality: N/A;   HPI:  Henry Boone is a left handed 52 y.o. male who presented to [facility] after waking up with slurred speech, right facial droop, and right sided weakness. PMHx includes CVA, hypertension, hyperlipidemia, and ETOH abuse. CT head with CTA head neck, the latter revealing a left M2 occlusion. CTP revealed a 20 cc core in the left frontal lobe with penumbra of 23 cc. Pt currenlty NPO.    Assessment / Plan / Recommendation Clinical Impression    Pt presents with moderately impaired speech most consistent with dysarthria coupled with apraxia of speech. Dysarthria is characterized by imprecise articulation, most notable at sentence and phrase levelS, with pt benefiting from Mod verbal cues to use intelligibility strategies (pausing, increased vocal intensity, slow speech, and over-articulating) to increase speech intelligibility. Mod motor planning deficits were characterized by inconsistent groping, disfluencies, and pauses in speech, with pt aware of deficits. SLP provided Min to Mod verbal cues to aid in completing sentences. SLP also provided pen and paper for multimodal  communication. Pt's cognition and language ability were functional for basic tasks assessed, with further higher level testing warranted given pt's high level of functioning PTA. Recommend continued follow up to address pt's dysarthria/motor planning deficits and for further pt/family education.      SLP Assessment  SLP Recommendation/Assessment: Patient needs continued Speech Lanaguage Pathology Services SLP Visit Diagnosis: Dysarthria and anarthria (R47.1);Apraxia (R48.2)    Follow Up Recommendations  Other (comment)(tbd)    Frequency and Duration min 2x/week  2 weeks      SLP Evaluation Cognition  Overall Cognitive Status: Within Functional Limits for tasks assessed Orientation Level: (P) Oriented X4       Comprehension  Auditory Comprehension Overall Auditory Comprehension: Appears within functional limits for tasks assessed Visual Recognition/Discrimination Discrimination: Not tested Reading Comprehension Reading Status: Not tested    Expression Expression Primary Mode of Expression: Verbal Verbal Expression Overall Verbal Expression: Appears within functional limits for tasks assessed Written Expression Dominant Hand: Left Written Expression: Not tested   Oral / Motor  Oral Motor/Sensory Function Overall Oral Motor/Sensory Function: Moderate impairment Facial ROM: Reduced right;Suspected CN VII (facial) dysfunction Facial Symmetry: Abnormal symmetry right;Suspected CN VII (facial) dysfunction Facial Strength: Reduced right;Suspected CN VII (facial) dysfunction Facial Sensation: Reduced right;Suspected CN V (Trigeminal) dysfunction Lingual ROM: Within Functional Limits Lingual Symmetry: Within Functional Limits Lingual Strength: Within Functional Limits Velum: Within Functional Limits Mandible: Within Functional Limits Motor Speech Overall Motor Speech: Impaired Respiration: Within functional limits Phonation: Other (comment)(mildly rough) Resonance: Within  functional limits Articulation: Impaired Level of Impairment: Word Intelligibility: Intelligibility reduced Word: 50-74% accurate Phrase: 50-74% accurate Sentence: 50-74% accurate Conversation: 50-74% accurate Motor Planning: Impaired Level of Impairment: Word Motor  Speech Errors: Groping for words;Inconsistent;Aware Effective Techniques: Slow rate;Increased vocal intensity;Over-articulate;Pause   GO                   [place] Skylah Delauter SLP Student Clinician  [place] Bindu Docter 07/23/2017, 4:26 PM

## 2017-07-23 NOTE — ED Notes (Addendum)
Patient transported to CT with this RN and teleneuro cart from CCU

## 2017-07-23 NOTE — H&P (Addendum)
Admission H&P    Chief Complaint: Right facial droop, right sided weakness and aphemia  HPI: Henry Boone is a left handed 52 y.o. male who presented to [facility] after waking up with slurred speech. Teleneurology consult at [facility] recommended CT head with CTA head neck, the latter revealing a left M2 occlusion. CTP revealed a 20 cc core in the left frontal lobe with penumbra of 23 cc. Teleneurologist felt that the patient's speech deficit was most consistent with aphemia, rather than a Broca's aphasia. The patient was emergently transported to [facility] for possible thrombectomy.   Home medications: ASA, lisinopril and pravachol  LSN: 9:00 PM Monday night tPA Given: No: Out of time window   Past Medical History:  Diagnosis Date  . ETOH abuse   . Hyperlipidemia   . Hypertension   . Stroke [facility])     Past Surgical History:  Procedure Laterality Date  . TEE WITHOUT CARDIOVERSION N/A 11/15/2014   Procedure: TRANSESOPHAGEAL ECHOCARDIOGRAM (TEE);  Surgeon: Minna Merritts, MD;  Location: [facility] ORS;  Service: Cardiovascular;  Laterality: N/A;    No family history on file. Social History:  reports that he has been smoking.  He does not have any smokeless tobacco history on file. He reports that he drinks alcohol. His drug history is not on file.  Allergies: No Known Allergies  ROS: As per HPI.   Physical Examination: There were no vitals taken for this visit.  HEENT-  [place]/AT  Lungs - Respirations unlabored Extremities - Warm and well perfused  Neurologic Examination: Ment: Alert and oriented. Prominent apraxic and dysarthric speech pattern most c/w aphemia, with intact comprehension. No word finding difficulty noted in the context of the apraxic speech. Able to write clear sentences.  CN: PERRL. Visual fields intact. EOMI without gaze preference. Mild right facial droop. Tongue deviates to right.  Motor: RUE: drift with weak grip strength, otherwise unremarkable RLE: 5/5 without drift.  LUE and  LLE: 5/5 Sensory: Intact to FT Reflexes: Unremarkable Cerebellar: Intact FNF bilaterally Gait: Deferred   Results for orders placed or performed during the hospital encounter of 07/23/17 (from the past 48 hour(s))  Protime-INR     Status: None   Collection Time: 07/23/17  6:25 AM  Result Value Ref [place]   Prothrombin Time 12.4 11.4 - 15.2 seconds   INR 0.93     Comment: Performed at [facility], [address] [place], [place] [address]  APTT     Status: None   Collection Time: 07/23/17  6:25 AM  Result Value Ref [place]   aPTT 30 24 - 36 seconds    Comment: Performed at [facility], [address] [place], [place] [address]  CBC     Status: Abnormal   Collection Time: 07/23/17  6:25 AM  Result Value Ref Range   WBC 11.4 (H) 3.8 - 10.6 K/uL   RBC 4.97 4.40 - 5.90 MIL/uL   Hemoglobin 15.5 13.0 - 18.0 g/dL   HCT 47.1 40.0 - 52.0 %   MCV 94.7 80.0 - 100.0 fL   MCH 31.3 26.0 - 34.0 pg   MCHC 33.0 32.0 - 36.0 g/dL   RDW 13.6 11.5 - 14.5 %   Platelets 307 150 - 440 K/uL    Comment: Performed at [facility], [address] [place], [place] [address]  Differential     Status: Abnormal   Collection Time: 07/23/17  6:25 AM  Result Value Ref Range   Neutrophils Relative % 70 %  Neutro Abs 8.0 (H) 1.4 - 6.5 K/uL   Lymphocytes Relative 20 %   Lymphs Abs 2.3 1.0 - 3.6 K/uL   Monocytes Relative 8 %   Monocytes Absolute 0.9 0.2 - 1.0 K/uL   Eosinophils Relative 2 %   Eosinophils Absolute 0.3 0 - 0.7 K/uL   Basophils Relative 0 %   Basophils Absolute 0.0 0 - 0.1 K/uL    Comment: Performed at [facility], [address] [place], [place] [address]  Comprehensive metabolic panel     Status: Abnormal   Collection Time: 07/23/17  6:25 AM  Result Value Ref Range   Sodium 136 135 - 145 mmol/L   Potassium 3.9 3.5 - 5.1 mmol/L   Chloride 105 101 - 111 mmol/L   CO2 25 22 - 32 mmol/L   Glucose, Bld 135 (H) 65 - 99 mg/dL   BUN 6 6 - 20 mg/dL    Creatinine, Ser 0.76 0.61 - 1.24 mg/dL   Calcium 8.9 8.9 - 10.3 mg/dL   Total Protein 6.7 6.5 - 8.1 g/dL   Albumin 4.1 3.5 - 5.0 g/dL   AST 21 15 - 41 U/L   ALT 10 (L) 17 - 63 U/L   Alkaline Phosphatase 44 38 - 126 U/L   Total Bilirubin 0.7 0.3 - 1.2 mg/dL   GFR calc non Af Amer >60 >60 mL/min   GFR calc Af Amer >60 >60 mL/min    Comment: (NOTE) The eGFR has been calculated using the CKD EPI equation. This calculation has not been validated in all clinical situations. eGFR's persistently <60 mL/min signify possible Chronic Kidney Disease.    Anion gap 6 5 - 15    Comment: Performed at [facility] [address] [place], [place] [address]  Troponin I     Status: None   Collection Time: 07/23/17  6:25 AM  Result Value Ref [place]   Troponin I <0.03 <0.03 ng/mL    Comment: Performed at [facility], [address] [place], [place] [address]  Ethanol     Status: None   Collection Time: 07/23/17  7:26 AM  Result Value Ref Range   Alcohol, Ethyl (B) <10 <10 mg/dL    Comment:        LOWEST DETECTABLE LIMIT FOR SERUM ALCOHOL IS 10 mg/dL FOR MEDICAL PURPOSES ONLY Performed at [facility], [address] [place],  [address]    Ct Angio Head W Or Wo Contrast  Result Date: 07/23/2017 CLINICAL DATA:  Acute presentation with slurred speech and bilateral arm numbness upon wakening at 0530 hours. Speech disturbance. 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 Question hyperdense left MCA branch. EXAM: CT ANGIOGRAPHY HEAD AND NECK CT PERFUSION BRAIN TECHNIQUE: Multidetector CT imaging of the head and neck was performed using the standard protocol during bolus administration of intravenous contrast. Multiplanar CT image reconstructions and MIPs were obtained to evaluate the vascular anatomy. Carotid stenosis measurements (when applicable) are obtained utilizing NASCET criteria, using the distal internal carotid diameter as the denominator.  Multiphase CT imaging of the brain was performed following IV bolus contrast injection. Subsequent parametric perfusion maps were calculated using RAPID software. CONTRAST:  128m ISOVUE-370 IOPAMIDOL (ISOVUE-370) INJECTION 76% COMPARISON:  CT earlier same day.  MRI 11/11/2014. FINDINGS: CTA NECK FINDINGS Aortic arch: Minimal aortic atherosclerosis. No aneurysm or dissection. Branching pattern of the brachiocephalic vessels from the arch is normal with mild atherosclerotic change but no stenosis. Right carotid system: Common carotid artery  shows some plaque along its course to the bifurcation but no stenosis or irregularity. Right ICA bifurcation shows soft and calcified plaque of the ICA bulb but without stenosis greater than diameter of the more distal cervical ICA. Left carotid system: Common carotid artery shows some plaque along its course to the bifurcation but no stenosis or irregularity. At the carotid bifurcation, there is atherosclerotic plaque both soft and calcified. Minimal diameter of the distal bulb is 3.7 mm. Compared to an expected distal cervical ICA diameter of 4 mm, this indicates a 10-20% stenosis. There is focus soft plaque affecting the cervical ICA at the C1 level which narrows the luminal diameter to 2.4 mm, consistent with a 40% stenosis. I do not see obvious ulceration. Vertebral arteries: Left vertebral artery origin is low but widely patent. There is calcified plaque at the right vertebral artery origin with stenosis estimated at 30-50%. Both vertebral arteries are small but otherwise patent through the cervical region to the foramen magnum. Skeleton: Mid cervical spondylosis. Other neck: No mass or lymphadenopathy. Possible posterior nasal polyp. Upper chest: Advanced emphysema. Review of the MIP images confirms the above findings CTA HEAD FINDINGS Anterior circulation: Both internal carotid arteries are patent through the skull base and siphon regions. There is advanced atherosclerotic  disease in the carotid siphon regions with narrowing and irregularity. This is particularly severe on the left where the lumen appears barely patent. Additionally, there is a patent trigeminal artery on the left which is self shows atherosclerotic change in stenosis, giving the majority supply to the basilar tip. Right supraclinoid ICA regains a normal caliber. There is fetal origin of the right PCA. Right anterior and middle cerebral vessels are widely patent presently. On the left, supraclinoid ICA shows 50-70% stenosis. There is flow in the left anterior and middle cerebral vessels. M3 branch vessel occlusion in the region of the left posterior frontal brain. Posterior circulation: Both vertebral arteries are patent through the foramen magnum, supplying both picas. There is a tiny basilar artery which is patent to the level where the patent trigeminal artery supplies the distal basilar. Superior cerebellar and posterior cerebral vessels are patent, with fetal origin of the right PCA as noted above. Venous sinuses: Patent and normal. Anatomic variants: See above for discussion of left trigeminal artery. Delayed phase: No abnormal enhancement. Review of the MIP images confirms the above findings CT Brain Perfusion Findings: CBF (<30%) Volume: 35m Perfusion (Tmax>6.0s) volume: 40 formL Mismatch Volume: 265mInfarction Location:Left posterior frontal brain IMPRESSION: Acute infarction in the left posterior frontal brain with region of CBF less than 30% measured at 21 cc and T-max greater than 6 seconds at 44 cc for a 23 cc region of at risk brain. Given this, the patient would be a candidate for consideration of embolectomy. M2-3 branch inclusion, though difficult to identify as pre CT a perfusion imaging causes vascular opacification. Nonstenotic atherosclerotic change at both carotid bifurcations. Focal soft plaque of the upper cervical ICA on the left at the C1 level with stenosis of 40% but no visible  ulceration. Very severe atherosclerotic change of both carotid siphon regions with severe stenosis and irregularity. This could serve as a challenge for embolectomy. 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 These results were called by telephone at the time of interpretation on 07/23/2017 at 7:20 am to Dr. ALCharlesetta Ivory who verbally acknowledged these results. Electronically Signed   By:  Nelson Chimes M.D.   On: 07/23/2017 07:42   Ct Head Wo Contrast  Result Date: 07/23/2017 CLINICAL DATA:  52 y/o  M; slurred speech and right facial droop. EXAM: CT HEAD WITHOUT CONTRAST TECHNIQUE: Contiguous axial images were obtained from the base of the skull through the vertex without intravenous contrast. COMPARISON:  11/11/2014 CT and MRI of the head. FINDINGS: Brain: Moderate right parietal and small left parietal chronic infarctions. No acute infarct, hemorrhage, or focal mass effect of the brain identified. No extra-axial collection, hydrocephalus, effacement of basilar cisterns, or herniation. Vascular: Calcific atherosclerosis of carotid siphons. Density within the proximal left M2 superior division (series 4, image 29). Skull: Normal. Negative for fracture or focal lesion. Sinuses/Orbits: Maxillary sinus mucosal thickening. Otherwise normally aerated paranasal sinuses and mastoid air cells. Orbits are unremarkable. Other: None. IMPRESSION: 1. No acute intracranial abnormality identified.  ASPECTS is 10. 2. Left M2 superior division dense vessel may represent a thrombus. CTA of the head recommended. 3. Right larger than left parietal lobe chronic infarctions. These results were called by telephone at the time of interpretation on 07/23/2017 at 6:27 am to Dr. Dahlia Client, who verbally acknowledged these results. Electronically Signed   By: Kristine Garbe M.D.   On: 07/23/2017 06:33   Ct Angio Neck W Or Wo  Contrast  Result Date: 07/23/2017 CLINICAL DATA:  Acute presentation with slurred speech and bilateral arm numbness upon wakening at 0530 hours. Speech disturbance. 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 Question hyperdense left MCA branch. EXAM: CT ANGIOGRAPHY HEAD AND NECK CT PERFUSION BRAIN TECHNIQUE: Multidetector CT imaging of the head and neck was performed using the standard protocol during bolus administration of intravenous contrast. Multiplanar CT image reconstructions and MIPs were obtained to evaluate the vascular anatomy. Carotid stenosis measurements (when applicable) are obtained utilizing NASCET criteria, using the distal internal carotid diameter as the denominator. Multiphase CT imaging of the brain was performed following IV bolus contrast injection. Subsequent parametric perfusion maps were calculated using RAPID software. CONTRAST:  177m ISOVUE-370 IOPAMIDOL (ISOVUE-370) INJECTION 76% COMPARISON:  CT earlier same day.  MRI 11/11/2014. FINDINGS: CTA NECK FINDINGS Aortic arch: Minimal aortic atherosclerosis. No aneurysm or dissection. Branching pattern of the brachiocephalic vessels from the arch is normal with mild atherosclerotic change but no stenosis. Right carotid system: Common carotid artery shows some plaque along its course to the bifurcation but no stenosis or irregularity. Right ICA bifurcation shows soft and calcified plaque of the ICA bulb but without stenosis greater than diameter of the more distal cervical ICA. Left carotid system: Common carotid artery shows some plaque along its course to the bifurcation but no stenosis or irregularity. At the carotid bifurcation, there is atherosclerotic plaque both soft and calcified. Minimal diameter of the distal bulb is 3.7 mm. Compared to an expected distal cervical ICA diameter of 4 mm, this indicates a 10-20% stenosis. There is focus soft plaque affecting the cervical ICA at the C1 level which narrows the luminal  diameter to 2.4 mm, consistent with a 40% stenosis. I do not see obvious ulceration. Vertebral arteries: Left vertebral artery origin is low but widely patent. There is calcified plaque at the right vertebral artery origin with stenosis estimated at 30-50%. Both vertebral arteries are small but otherwise patent through the cervical region to the foramen magnum. Skeleton: Mid cervical spondylosis. Other neck: No mass or lymphadenopathy. Possible posterior nasal polyp. Upper chest: Advanced emphysema. Review of the MIP images confirms the above findings CTA HEAD FINDINGS  Anterior circulation: Both internal carotid arteries are patent through the skull base and siphon regions. There is advanced atherosclerotic disease in the carotid siphon regions with narrowing and irregularity. This is particularly severe on the left where the lumen appears barely patent. Additionally, there is a patent trigeminal artery on the left which is self shows atherosclerotic change in stenosis, giving the majority supply to the basilar tip. Right supraclinoid ICA regains a normal caliber. There is fetal origin of the right PCA. Right anterior and middle cerebral vessels are widely patent presently. On the left, supraclinoid ICA shows 50-70% stenosis. There is flow in the left anterior and middle cerebral vessels. M3 branch vessel occlusion in the region of the left posterior frontal brain. Posterior circulation: Both vertebral arteries are patent through the foramen magnum, supplying both picas. There is a tiny basilar artery which is patent to the level where the patent trigeminal artery supplies the distal basilar. Superior cerebellar and posterior cerebral vessels are patent, with fetal origin of the right PCA as noted above. Venous sinuses: Patent and normal. Anatomic variants: See above for discussion of left trigeminal artery. Delayed phase: No abnormal enhancement. Review of the MIP images confirms the above findings CT Brain  Perfusion Findings: CBF (<30%) Volume: 46m Perfusion (Tmax>6.0s) volume: 40 formL Mismatch Volume: 217mInfarction Location:Left posterior frontal brain IMPRESSION: Acute infarction in the left posterior frontal brain with region of CBF less than 30% measured at 21 cc and T-max greater than 6 seconds at 44 cc for a 23 cc region of at risk brain. Given this, the patient would be a candidate for consideration of embolectomy. M2-3 branch inclusion, though difficult to identify as pre CT a perfusion imaging causes vascular opacification. Nonstenotic atherosclerotic change at both carotid bifurcations. Focal soft plaque of the upper cervical ICA on the left at the C1 level with stenosis of 40% but no visible ulceration. Very severe atherosclerotic change of both carotid siphon regions with severe stenosis and irregularity. This could serve as a challenge for embolectomy. 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 These results were called by telephone at the time of interpretation on 07/23/2017 at 7:20 am to Dr. ALCharlesetta Ivory who verbally acknowledged these results. Electronically Signed   By: MaNelson Chimes.D.   On: 07/23/2017 07:42   Ct Cerebral Perfusion W Contrast  Result Date: 07/23/2017 CLINICAL DATA:  Acute presentation with slurred speech and bilateral arm numbness upon wakening at 0530 hours. Speech disturbance. 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 Question hyperdense left MCA branch. EXAM: CT ANGIOGRAPHY HEAD AND NECK CT PERFUSION BRAIN TECHNIQUE: Multidetector CT imaging of the head and neck was performed using the standard protocol during bolus administration of intravenous contrast. Multiplanar CT image reconstructions and MIPs were obtained to evaluate the vascular anatomy. Carotid stenosis measurements (when applicable) are obtained utilizing NASCET criteria, using the distal  internal carotid diameter as the denominator. Multiphase CT imaging of the brain was performed following IV bolus contrast injection. Subsequent parametric perfusion maps were calculated using RAPID software. CONTRAST:  10044mSOVUE-370 IOPAMIDOL (ISOVUE-370) INJECTION 76% COMPARISON:  CT earlier same day.  MRI 11/11/2014. FINDINGS: CTA NECK FINDINGS Aortic arch: Minimal aortic atherosclerosis. No aneurysm or dissection. Branching pattern of the brachiocephalic vessels from the arch is normal with mild atherosclerotic change but no stenosis. Right carotid system: Common carotid artery shows some plaque along its course to the bifurcation  but no stenosis or irregularity. Right ICA bifurcation shows soft and calcified plaque of the ICA bulb but without stenosis greater than diameter of the more distal cervical ICA. Left carotid system: Common carotid artery shows some plaque along its course to the bifurcation but no stenosis or irregularity. At the carotid bifurcation, there is atherosclerotic plaque both soft and calcified. Minimal diameter of the distal bulb is 3.7 mm. Compared to an expected distal cervical ICA diameter of 4 mm, this indicates a 10-20% stenosis. There is focus soft plaque affecting the cervical ICA at the C1 level which narrows the luminal diameter to 2.4 mm, consistent with a 40% stenosis. I do not see obvious ulceration. Vertebral arteries: Left vertebral artery origin is low but widely patent. There is calcified plaque at the right vertebral artery origin with stenosis estimated at 30-50%. Both vertebral arteries are small but otherwise patent through the cervical region to the foramen magnum. Skeleton: Mid cervical spondylosis. Other neck: No mass or lymphadenopathy. Possible posterior nasal polyp. Upper chest: Advanced emphysema. Review of the MIP images confirms the above findings CTA HEAD FINDINGS Anterior circulation: Both internal carotid arteries are patent through the skull base and  siphon regions. There is advanced atherosclerotic disease in the carotid siphon regions with narrowing and irregularity. This is particularly severe on the left where the lumen appears barely patent. Additionally, there is a patent trigeminal artery on the left which is self shows atherosclerotic change in stenosis, giving the majority supply to the basilar tip. Right supraclinoid ICA regains a normal caliber. There is fetal origin of the right PCA. Right anterior and middle cerebral vessels are widely patent presently. On the left, supraclinoid ICA shows 50-70% stenosis. There is flow in the left anterior and middle cerebral vessels. M3 branch vessel occlusion in the region of the left posterior frontal brain. Posterior circulation: Both vertebral arteries are patent through the foramen magnum, supplying both picas. There is a tiny basilar artery which is patent to the level where the patent trigeminal artery supplies the distal basilar. Superior cerebellar and posterior cerebral vessels are patent, with fetal origin of the right PCA as noted above. Venous sinuses: Patent and normal. Anatomic variants: See above for discussion of left trigeminal artery. Delayed phase: No abnormal enhancement. Review of the MIP images confirms the above findings CT Brain Perfusion Findings: CBF (<30%) Volume: 70m Perfusion (Tmax>6.0s) volume: 40 formL Mismatch Volume: 233mInfarction Location:Left posterior frontal brain IMPRESSION: Acute infarction in the left posterior frontal brain with region of CBF less than 30% measured at 21 cc and T-max greater than 6 seconds at 44 cc for a 23 cc region of at risk brain. Given this, the patient would be a candidate for consideration of embolectomy. M2-3 branch inclusion, though difficult to identify as pre CT a perfusion imaging causes vascular opacification. Nonstenotic atherosclerotic change at both carotid bifurcations. Focal soft plaque of the upper cervical ICA on the left at the C1  level with stenosis of 40% but no visible ulceration. Very severe atherosclerotic change of both carotid siphon regions with severe stenosis and irregularity. This could serve as a challenge for embolectomy. 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 These results were called by telephone at the time of interpretation on 07/23/2017 at 7:20 am to Dr. ALCharlesetta Ivory who verbally acknowledged these results. Electronically Signed   By: MaNelson Chimes.D.   On: 07/23/2017 07:42  Assessment: 52 y.o. 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 Also noted on CTA is severe stenosis of carotid siphons bilaterally secondary to atherosclerosis, as well as focal soft plaque of the upper cervical ICA on the left at the C1 level with stenosis of 40% but no visible ulceration. 2. CTP with infarction in left anterior frontal lobe and 23 cc penumbra 3. Stroke Risk Factors - HLD, HTN, prior stroke.  4. The patient was consented for conventional angiography with possible clot retraction procedure and/or stenting.   Plan:  On arrival from [facility], the patient was emergently transported to [place] for 4 vessel angiogram.    Addendum following angiogram: Has occluded left carotid siphon. Unable to access left M2 thrombus. Per Dr. Estanislado Pandy, the patient will need right cavernous ICA stenting within 5-7 days. If still inpatient, then schedule as such. If to be discharged prior to 5 days, then schedule outpatient appointment with Dr. Estanislado Pandy. Admitted to ICU under the Neurology service.    Admission Plan: 1. HgbA1c, fasting lipid panel 2. MRI of the brain without contrast 3. PT consult, OT consult, Speech consult 4. Echocardiogram 5. Continue ASA 325 mg po qd.  6. Start Plavix 75 mg po qd.  7. Continue pravachol. 8. BP management post-angiogram with VIR.  9. Telemetry  monitoring  50 minutes spent in the emergent neurological evaluation and management of this critically ill patient.   Electronically signed: Dr. Kerney Elbe  07/23/2017, 9:33 AM

## 2017-07-23 NOTE — Progress Notes (Signed)
  Echocardiogram 2D Echocardiogram has been performed.  Henry Boone 07/23/2017, 4:23 PM

## 2017-07-23 NOTE — Progress Notes (Signed)
Patient ID: Henry Boone, male   DOB: 1965-06-08, 52 y.o.   MRN: [id number] INR   72 y olffd M LSW [address] Woke up this am with severe dysarthria and rt facial weakness . Premorbid mRSscore 0. CT brain No ICH . ASPECTS 10 subacute to remote biparietal infarcts R>L. CTA ? Distal Lt MCA M2 thrombus. CTP Rapid  CBF < 30 % vol 79ml.Tmax > 6.0s vol 34ml  Mismatch 23 ml   Given the above history and clinical and neuro evaluation option of diagnostic arteriogram with possible endovascular treatment  Discussed with patient reasons ,procedure ,risks ,alternatives reviewed. . Patient agreed to diagnostic angiogram and possible endovascular treatment .Risks of ICH of 10 % ,worsening neuro deficit , vent dependency ,death and  Inability  to revascularize discussed with patient. S.Madilyne Tadlock MD

## 2017-07-23 NOTE — Progress Notes (Signed)
PT Cancellation Note  Patient Details Name: Henry Boone MRN: [id number] DOB: 05/06/1965   Cancelled Treatment:    Reason Eval/Treat Not Completed: Active bedrest order   Berniece Abid B Claudetta Sallie 07/23/2017, 10:38 AM  Elwyn Reach, [place]

## 2017-07-23 NOTE — ED Notes (Signed)
Dr. Deirdre Evener on with pt

## 2017-07-23 NOTE — ED Notes (Signed)
Henry Boone with teleneuro is on screen with pt

## 2017-07-23 NOTE — Consult Note (Signed)
  [facility] TeleNeurology Consult Services   Impression: Last known well was the night befrore, and woke up with word finidng difficulty. 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 Patient is transferred immediately for thrombectomy,  Differential Diagnosis:                                1. Cardioembolic stroke  3. Thromboembolic, artery-to-artery mechanism 4. Hypercoagulable state-related infarct  6. Thrombotic mechanism, large artery disease   Comments:  LAST  Known well: 9 pm [facility] contacted: 092 a [facility] at bedside:643 am NIHSS assessment time:same   Recommendations:  posisitve prepare and tranasfer to [place] for thrombectomy. Inpatient neurology consultation Inpatient stroke evaluation as per Neurology/ Internal Medicine Discussed with ED MD Please call with questions   -----------------------------------------------------------------------------------------   CC   History of Present Illness   Patient is a 52 y.o. that comes in with aphasia. HE has a history of alcohol abuse.   Past Medical History:  Diagnosis Date  . ETOH abuse       Diagnostic:  CT head shnows probabale left M2 thrombosis, negative for bleed.     Exam:  NIHSS is 18   1A: Level of Consciousness - Alert; keenly responsive 0 1B: Ask Month and Age - Both Questions Right 0 1C: 'Blink Eyes' & 'Squeeze Hands' - Performs 0 Tasks +2 2: Test Horizontal Extraocular Movements - Normal 0 3: Test Visual Fields - No Visual Loss 0 5A: Test Left Arm Motor Drift - No Drift for 10 Seconds 0 5B: Test Right Arm Motor Drift - No Movement +4 6A: Test Left Leg Motor Drift - No Drift for 5 Seconds 0 6B: Test Right Leg Motor Drift - No Movement +4 7: Test Limb Ataxia - No Ataxia 0 8: Test Sensation - Complete Loss: Cannot Sense Being Touched At All +2 9: Test Language/Aphasia - ute/Global Aphasia: No Usable Speech/Auditory Comprehensionute/Global Aphasia: No Usable  Speech/Auditory Comprehension +3 10: Test Dysarthria - Severe Dysarthria: Unintelligble Slurring or Out of Proportion to Dysphasia +2 11: Test Extinction/Inattention - Extinction to bilateral simultaneous stimulation +1        Medical Decision Making: - Extensive number of diagnosis or management options are considered above.  - Extensive amount of complex data reviewed.  - High risk of complication and/or morbidity or mortality are associated with differential diagnostic considerations above. - There may be Uncertain outcome and increased probability of prolonged functional impairment or high probability of severe prolonged functional impairment associated with some of these differential diagnosis. Medical Data Reviewed: 1.Data reviewed include clinical labs, radiology,  Medical Tests;  2.Tests results discussed w/performing or interpreting physician;  3.Obtaining/reviewing old medical records; 4.Obtaining case history from another source; 5.Independent review of image, tracing or specimen.     Patient was informed the Neurology Consult would happen via telehealth (remote video) and consented to receiving care in this manner.

## 2017-07-23 NOTE — Evaluation (Signed)
Clinical/Bedside Swallow Evaluation Patient Details  Name: Henry Boone MRN: [id number] Date of Birth: 04/16/1966  Today's Date: 07/23/2017 Time: SLP Start Time (ACUTE ONLY): 1425 SLP Stop Time (ACUTE ONLY): 1441 SLP Time Calculation (min) (ACUTE ONLY): 16 min  Past Medical History:  Past Medical History:  Diagnosis Date  . ETOH abuse   . Hyperlipidemia   . Hypertension   . Stroke [facility])    Past Surgical History:  Past Surgical History:  Procedure Laterality Date  . TEE WITHOUT CARDIOVERSION N/A 11/15/2014   Procedure: TRANSESOPHAGEAL ECHOCARDIOGRAM (TEE);  Surgeon: Minna Merritts, MD;  Location: [facility] ORS;  Service: Cardiovascular;  Laterality: N/A;   HPI:  Henry Boone is a left handed 52 y.o. male who presented to [facility] after waking up with slurred speech, right facial droop, and right sided weakness. PMHx includes CVA, hypertension, hyperlipidemia, and ETOH abuse. CT head with CTA head neck, the latter revealing a left M2 occlusion. CTP revealed a 20 cc core in the left frontal lobe with penumbra of 23 cc. Pt currenlty NPO.    Assessment / Plan / Recommendation Clinical Impression    Pt presents with inconsistent throat clear/cough following sips of thin liquid. However, no s/s concerning for aspiration were observed with nectar-thick liquids. Pt was able to manipulate and clear oral cavity despite right sided droop/weakness with min verbal cues to take small, controlled bites. Recommend pt initiate a nectar thick liquid and Dysphagia 2 (chopped) diet with aspiration precautions (small/slow bites, only eating when alert, and HOB elevated to 90 degrees). SLP will follow up to ensure safety with recommend diet and determine ability to upgrade diet vs potential for instrumental testing.   SLP Visit Diagnosis: Dysphagia, unspecified (R13.10)    Aspiration Risk  Moderate aspiration risk    Diet Recommendation Dysphagia 2 (Fine chop);Nectar-thick liquid   Liquid Administration via:  Cup Medication Administration: Whole meds with puree Supervision: Patient able to self feed;Intermittent supervision to cue for compensatory strategies Compensations: Slow rate;Small sips/bites Postural Changes: Seated upright at 90 degrees    Other  Recommendations Oral Care Recommendations: Oral care BID Other Recommendations: Order thickener from pharmacy;Prohibited food (jello, ice cream, thin soups);Remove water pitcher   Follow up Recommendations Other (comment)(tbd)      Frequency and Duration min 2x/week  2 weeks       Prognosis Prognosis for Safe Diet Advancement: Good      Swallow Study   General HPI: Henry Boone is a left handed 52 y.o. male who presented to [facility] after waking up with slurred speech, right facial droop, and right sided weakness. PMHx includes CVA, hypertension, hyperlipidemia, and ETOH abuse. CT head with CTA head neck, the latter revealing a left M2 occlusion. CTP revealed a 20 cc core in the left frontal lobe with penumbra of 23 cc. Pt currenlty NPO.  Type of Study: Bedside Swallow Evaluation Previous Swallow Assessment: none in chart Diet Prior to this Study: NPO Temperature Spikes Noted: No Respiratory Status: Room air History of Recent Intubation: Yes Length of Intubations (days): (for procedure only) Date extubated: 07/23/17 Behavior/Cognition: Alert;Cooperative;Pleasant mood Oral Cavity Assessment: Within Functional Limits Oral Care Completed by SLP: Yes Oral Cavity - Dentition: Poor condition;Adequate natural dentition Vision: Functional for self-feeding Self-Feeding Abilities: Able to feed self Patient Positioning: Upright in bed Baseline Vocal Quality: Hoarse Volitional Cough: Strong Volitional Swallow: Able to elicit    Oral/Motor/Sensory Function Overall Oral Motor/Sensory Function: Moderate impairment Facial ROM: Reduced right;Suspected CN VII (facial) dysfunction Facial Symmetry: Abnormal symmetry  right;Suspected CN VII (facial)  dysfunction Facial Strength: Reduced right;Suspected CN VII (facial) dysfunction Facial Sensation: Reduced right;Suspected CN V (Trigeminal) dysfunction Lingual ROM: Within Functional Limits Lingual Symmetry: Within Functional Limits Lingual Strength: Within Functional Limits Velum: Within Functional Limits Mandible: Within Functional Limits   Ice Chips Ice chips: Within functional limits Presentation: Spoon   Thin Liquid Thin Liquid: Impaired Presentation: Self Fed;Cup Pharyngeal  Phase Impairments: Cough - Delayed;Throat Clearing - Delayed    Nectar Thick Nectar Thick Liquid: Within functional limits Presentation: Cup;Self Fed   Honey Thick Honey Thick Liquid: Not tested   Puree Puree: Within functional limits Presentation: Self Fed;Spoon   Solid   GO   Solid: Impaired Presentation: Self Fed Oral Phase Impairments: Poor awareness of bolus Oral Phase Functional Implications: Oral residue;Prolonged oral transit       Henry Boone SLP Student Clinician  Henry Boone 07/23/2017,4:34 PM

## 2017-07-23 NOTE — ED Notes (Signed)
[facility] at bedside 

## 2017-07-23 NOTE — ED Triage Notes (Signed)
Pt presents to ED with slurred speech bilateral arm numbness upon waking around 0530. Hx CVA X2. Last time seen at baseline was 2130. Pt with garbled speech. Pt states he is scared.

## 2017-07-24 ENCOUNTER — Encounter (HOSPITAL_COMMUNITY): Payer: Self-pay | Admitting: Interventional Radiology

## 2017-07-24 ENCOUNTER — Inpatient Hospital Stay (HOSPITAL_COMMUNITY): Payer: Managed Care, Other (non HMO)

## 2017-07-24 DIAGNOSIS — Z8673 Personal history of transient ischemic attack (TIA), and cerebral infarction without residual deficits: Secondary | ICD-10-CM

## 2017-07-24 DIAGNOSIS — I6521 Occlusion and stenosis of right carotid artery: Secondary | ICD-10-CM

## 2017-07-24 DIAGNOSIS — E785 Hyperlipidemia, unspecified: Secondary | ICD-10-CM

## 2017-07-24 DIAGNOSIS — F17200 Nicotine dependence, unspecified, uncomplicated: Secondary | ICD-10-CM

## 2017-07-24 DIAGNOSIS — I6522 Occlusion and stenosis of left carotid artery: Secondary | ICD-10-CM

## 2017-07-24 DIAGNOSIS — I63233 Cerebral infarction due to unspecified occlusion or stenosis of bilateral carotid arteries: Secondary | ICD-10-CM

## 2017-07-24 LAB — LIPID PANEL
Cholesterol: 105 mg/dL (ref 0–200)
HDL: 33 mg/dL — AB (ref >40)
LDL CALC: 56 mg/dL (ref 0–99)
TRIGLYCERIDES: 81 mg/dL (ref <150)
Total CHOL/HDL Ratio: 3.2 RATIO
VLDL: 16 mg/dL (ref 0–40)

## 2017-07-24 LAB — HEMOGLOBIN A1C
Hgb A1c MFr Bld: 5.5 % (ref 4.8–5.6)
Mean Plasma Glucose: 111.15 mg/dL

## 2017-07-24 LAB — HIV ANTIBODY (ROUTINE TESTING W REFLEX): HIV Screen 4th Generation wRfx: NONREACTIVE

## 2017-07-24 MED ORDER — ASPIRIN EC 325 MG PO TBEC
325.0000 mg | DELAYED_RELEASE_TABLET | Freq: Every day | ORAL | Status: DC
  Administered 2017-07-25: 325 mg via ORAL
  Filled 2017-07-24: qty 1

## 2017-07-24 NOTE — Progress Notes (Signed)
Referring Physician(s): CODE STROKE  Supervising Physician: Luanne Bras  Patient Status:  [facility] - In-pt  Chief Complaint: Right facial droop, right-sided weakness, and aphemia.  Subjective:  Right facial droop, right-sided weakness, and aphemia.  Diagnostic cerebral angiogram 07/23/2017: 1.Occluded left ICA petrous cav junction with distal reconstitution from orbito ethmoid branches of left ECA of the cavernouis and supraclinoid segs. 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 3.Bilateral hypoplastic vert arteries.  Patient sitting in chair eating breakfast. Speech improved from yesterday. States weakness has improved but still notices numbness in right fingertips. Demonstrates right facial droop. Denies headache, dizziness, tingling, vision changes, hearing changes, or tinnitus. Right groin c/d/i.  Allergies: Patient has no known allergies.  Medications: Prior to Admission medications   Medication Sig Start Date End Date Taking? Authorizing Provider  aspirin EC 325 MG EC tablet Take 1 tablet (325 mg total) by mouth daily. 11/15/14  Yes Aldean Jewett, MD  lisinopril (PRINIVIL,ZESTRIL) 10 MG tablet Take 10 mg by mouth daily. 06/30/17  Yes [provider]  Multiple Vitamin (MULTIVITAMIN WITH MINERALS) TABS tablet Take 1 tablet by mouth daily. 11/15/14  Yes Aldean Jewett, MD  pravastatin (PRAVACHOL) 40 MG tablet Take 1 tablet (40 mg total) by mouth daily. 11/15/14  Yes Aldean Jewett, MD     Vital Signs: BP (!) 168/103   Pulse 85   Temp 98.4 F (36.9 C) (Oral)   Resp 14   Ht 6' (1.829 m)   Wt 161 lb 9.6 oz (73.3 kg)   SpO2 98%   BMI 21.92 kg/m   Physical Exam  Constitutional: He appears well-developed and well-nourished. No distress.  Cardiovascular: Normal rate, regular rhythm and normal heart sounds.  No murmur heard. Pulmonary/Chest: Effort normal and breath sounds normal. He has  no wheezes.  Neurological:  Alert, awake, and oriented x3. Speech clearer than yesterday, comprehension intact. PERRL bilaterally. EOMS intact bilaterally without nystagmus or subjective diplopia. Visual fields not assessed. Demonstrates right facial droop. Tongue deviates to right. Motor power symmetric proportional to effort. Pronator drift not assessed. Fine motor and coordination not assessed. Gait not assessed. Romberg not assessed. Heel to toe not assessed. Distal pulses 2+ bilaterally.  Skin: Skin is warm and dry.  Right groin incision soft without hematoma or active bleeding.  Psychiatric: He has a normal mood and affect. His behavior is normal. Judgment and thought content normal.  Nursing note and vitals reviewed.   Imaging: Ct Angio Head W Or Wo Contrast  Result Date: 07/23/2017 CLINICAL DATA:  Acute presentation with slurred speech and bilateral arm numbness upon wakening at 0530 hours. Speech disturbance. 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 Question hyperdense left MCA branch. EXAM: CT ANGIOGRAPHY HEAD AND NECK CT PERFUSION BRAIN TECHNIQUE: Multidetector CT imaging of the head and neck was performed using the standard protocol during bolus administration of intravenous contrast. Multiplanar CT image reconstructions and MIPs were obtained to evaluate the vascular anatomy. Carotid stenosis measurements (when applicable) are obtained utilizing NASCET criteria, using the distal internal carotid diameter as the denominator. Multiphase CT imaging of the brain was performed following IV bolus contrast injection. Subsequent parametric perfusion maps were calculated using RAPID software. CONTRAST:  161mL ISOVUE-370 IOPAMIDOL (ISOVUE-370) INJECTION 76% COMPARISON:  CT earlier same day.  MRI 11/11/2014. FINDINGS: CTA NECK FINDINGS Aortic arch: Minimal aortic atherosclerosis. No aneurysm or dissection. Branching pattern of the brachiocephalic vessels from the arch is  normal with mild atherosclerotic change but no stenosis. Right carotid system: Common carotid artery shows some plaque along its course to the bifurcation but no stenosis or irregularity. Right ICA bifurcation shows soft and calcified plaque of the ICA bulb but without stenosis greater than diameter of the more distal cervical ICA. Left carotid system: Common carotid artery shows some plaque along its course to the bifurcation but no stenosis or irregularity. At the carotid bifurcation, there is atherosclerotic plaque both soft and calcified. Minimal diameter of the distal bulb is 3.7 mm. Compared to an expected distal cervical ICA diameter of 4 mm, this indicates a 10-20% stenosis. There is focus soft plaque affecting the cervical ICA at the C1 level which narrows the luminal diameter to 2.4 mm, consistent with a 40% stenosis. I do not see obvious ulceration. Vertebral arteries: Left vertebral artery origin is low but widely patent. There is calcified plaque at the right vertebral artery origin with stenosis estimated at 30-50%. Both vertebral arteries are small but otherwise patent through the cervical region to the foramen magnum. Skeleton: Mid cervical spondylosis. Other neck: No mass or lymphadenopathy. Possible posterior nasal polyp. Upper chest: Advanced emphysema. Review of the MIP images confirms the above findings CTA HEAD FINDINGS Anterior circulation: Both internal carotid arteries are patent through the skull base and siphon regions. There is advanced atherosclerotic disease in the carotid siphon regions with narrowing and irregularity. This is particularly severe on the left where the lumen appears barely patent. Additionally, there is a patent trigeminal artery on the left which is self shows atherosclerotic change in stenosis, giving the majority supply to the basilar tip. Right supraclinoid ICA regains a normal caliber. There is fetal origin of the right PCA. Right anterior and middle cerebral  vessels are widely patent presently. On the left, supraclinoid ICA shows 50-70% stenosis. There is flow in the left anterior and middle cerebral vessels. M3 branch vessel occlusion in the region of the left posterior frontal brain. Posterior circulation: Both vertebral arteries are patent through the foramen magnum, supplying both picas. There is a tiny basilar artery which is patent to the level where the patent trigeminal artery supplies the distal basilar. Superior cerebellar and posterior cerebral vessels are patent, with fetal origin of the right PCA as noted above. Venous sinuses: Patent and normal. Anatomic variants: See above for discussion of left trigeminal artery. Delayed phase: No abnormal enhancement. 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 Given this, the patient would be a candidate for consideration of embolectomy. M2-3 branch inclusion, though difficult to identify as pre CT a perfusion imaging causes vascular opacification. Nonstenotic atherosclerotic change at both carotid bifurcations. Focal soft plaque of the upper cervical ICA on the left at the C1 level with stenosis of 40% but no visible ulceration. Very severe atherosclerotic change of both carotid siphon regions with severe stenosis and irregularity. This could serve as a challenge for embolectomy. 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 These results were called by telephone at the time of interpretation on 07/23/2017 at 7:20 am to Dr.  Charlesetta Ivory , who verbally  acknowledged these results. Electronically Signed   By: Nelson Chimes M.D.   On: 07/23/2017 07:42   Ct Head Wo Contrast  Result Date: 07/23/2017 CLINICAL DATA:  52 y/o  M; slurred speech and right facial droop. EXAM: CT HEAD WITHOUT CONTRAST TECHNIQUE: Contiguous axial images were obtained from the base of the skull through the vertex without intravenous contrast. COMPARISON:  11/11/2014 CT and MRI of the head. FINDINGS: Brain: Moderate right parietal and small left parietal chronic infarctions. No acute infarct, hemorrhage, or focal mass effect of the brain identified. No extra-axial collection, hydrocephalus, effacement of basilar cisterns, or herniation. Vascular: Calcific atherosclerosis of carotid siphons. Density within the proximal left M2 superior division (series 4, image 29). Skull: Normal. Negative for fracture or focal lesion. Sinuses/Orbits: Maxillary sinus mucosal thickening. Otherwise normally aerated paranasal sinuses and mastoid air cells. Orbits are unremarkable. Other: None. IMPRESSION: 1. No acute intracranial abnormality identified.  ASPECTS is 10. 2. Left M2 superior division dense vessel may represent a thrombus. CTA of the head recommended. 3. Right larger than left parietal lobe chronic infarctions. These results were called by telephone at the time of interpretation on 07/23/2017 at 6:27 am to Dr. Dahlia Client, who verbally acknowledged these results. Electronically Signed   By: Kristine Garbe M.D.   On: 07/23/2017 06:33   Ct Angio Neck W Or Wo Contrast  Result Date: 07/23/2017 CLINICAL DATA:  Acute presentation with slurred speech and bilateral arm numbness upon wakening at 0530 hours. Speech disturbance. 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 Question hyperdense left MCA branch. EXAM: CT ANGIOGRAPHY HEAD AND NECK CT PERFUSION BRAIN TECHNIQUE: Multidetector CT imaging of the head and neck was performed using the standard protocol during bolus administration of  intravenous contrast. Multiplanar CT image reconstructions and MIPs were obtained to evaluate the vascular anatomy. Carotid stenosis measurements (when applicable) are obtained utilizing NASCET criteria, using the distal internal carotid diameter as the denominator. Multiphase CT imaging of the brain was performed following IV bolus contrast injection. Subsequent parametric perfusion maps were calculated using RAPID software. CONTRAST:  130m ISOVUE-370 IOPAMIDOL (ISOVUE-370) INJECTION 76% COMPARISON:  CT earlier same day.  MRI 11/11/2014. FINDINGS: CTA NECK FINDINGS Aortic arch: Minimal aortic atherosclerosis. No aneurysm or dissection. Branching pattern of the brachiocephalic vessels from the arch is normal with mild atherosclerotic change but no stenosis. Right carotid system: Common carotid artery shows some plaque along its course to the bifurcation but no stenosis or irregularity. Right ICA bifurcation shows soft and calcified plaque of the ICA bulb but without stenosis greater than diameter of the more distal cervical ICA. Left carotid system: Common carotid artery shows some plaque along its course to the bifurcation but no stenosis or irregularity. At the carotid bifurcation, there is atherosclerotic plaque both soft and calcified. Minimal diameter of the distal bulb is 3.7 mm. Compared to an expected distal cervical ICA diameter of 4 mm, this indicates a 10-20% stenosis. There is focus soft plaque affecting the cervical ICA at the C1 level which narrows the luminal diameter to 2.4 mm, consistent with a 40% stenosis. I do not see obvious ulceration. Vertebral arteries: Left vertebral artery origin is low but widely patent. There is calcified plaque at the right vertebral artery origin with stenosis estimated at 30-50%. Both vertebral arteries are small but otherwise patent through the cervical region to the foramen magnum. Skeleton: Mid cervical spondylosis. Other neck: No mass or lymphadenopathy. Possible  posterior nasal polyp. Upper chest:  Advanced emphysema. Review of the MIP images confirms the above findings CTA HEAD FINDINGS Anterior circulation: Both internal carotid arteries are patent through the skull base and siphon regions. There is advanced atherosclerotic disease in the carotid siphon regions with narrowing and irregularity. This is particularly severe on the left where the lumen appears barely patent. Additionally, there is a patent trigeminal artery on the left which is self shows atherosclerotic change in stenosis, giving the majority supply to the basilar tip. Right supraclinoid ICA regains a normal caliber. There is fetal origin of the right PCA. Right anterior and middle cerebral vessels are widely patent presently. On the left, supraclinoid ICA shows 50-70% stenosis. There is flow in the left anterior and middle cerebral vessels. M3 branch vessel occlusion in the region of the left posterior frontal brain. Posterior circulation: Both vertebral arteries are patent through the foramen magnum, supplying both picas. There is a tiny basilar artery which is patent to the level where the patent trigeminal artery supplies the distal basilar. Superior cerebellar and posterior cerebral vessels are patent, with fetal origin of the right PCA as noted above. Venous sinuses: Patent and normal. Anatomic variants: See above for discussion of left trigeminal artery. Delayed phase: No abnormal enhancement. 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 Given this, the patient would be a candidate for consideration of embolectomy. M2-3 branch inclusion,  though difficult to identify as pre CT a perfusion imaging causes vascular opacification. Nonstenotic atherosclerotic change at both carotid bifurcations. Focal soft plaque of the upper cervical ICA on the left at the C1 level with stenosis of 40% but no visible ulceration. Very severe atherosclerotic change of both carotid siphon regions with severe stenosis and irregularity. This could serve as a challenge for embolectomy. 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 These results were called by telephone at the time of interpretation on 07/23/2017 at 7:20 am to Dr. ALCharlesetta Ivory who verbally acknowledged these results. Electronically Signed   By: MaNelson Chimes.D.   On: 07/23/2017 07:42   Mr Brain Wo Contrast  Result Date: 07/24/2017 CLINICAL DATA:  Initial evaluation for acute stroke. EXAM: MRI HEAD WITHOUT CONTRAST TECHNIQUE: Multiplanar, multiecho pulse sequences of the brain and surrounding structures were obtained without intravenous contrast. COMPARISON:  Comparison made with prior studies from 07/23/2017. FINDINGS: Brain: Mild diffuse prominence of the CSF containing spaces compatible with generalized age-related cerebral atrophy. No significant cerebral white matter changes for age. Scattered areas of encephalomalacia with gliosis present within the right parieto-occipital and left occipital regions consistent with remote ischemic infarcts. Minimal chronic susceptibility artifact seen about these infarcts at the right parietal region. Additional small remote lacunar infarct noted at the left caudate head. Patchy small volume acute ischemic left MCA territory infarcts seen involving the cortical gray matter and underlying subcortical white matter of the left mid and posterior frontal lobe. Patchy involvement of the mid/posterior left insula, with a few foci of diffusion abnormality within the  left basal ganglia. Largest area of infarct present at the left insula and measures approximately 12  mm. No associated hemorrhage or mass effect. Additional few subcentimeter foci of diffusion abnormality involves the cortical gray matter of the right posterior frontal parietal region, immediately anterior to the chronic right parietal infarct (series 5001, image 72). Findings also suspicious for possible additional tiny infarcts, although artifact could conceivably be considered given the adjacent chronic infarction. No associated hemorrhage or mass effect. No other evidence for acute or subacute ischemia. Gray-white matter differentiation otherwise maintained. No evidence for acute intracranial hemorrhage. No mass lesion, midline shift or mass effect. No hydrocephalus. No extra-axial fluid collection. Major dural sinuses are grossly patent. Pituitary gland suprasellar region normal. Midline structures intact and normal. Vascular: Major intracranial vascular flow voids are maintained at the skull base. Focus of susceptibility artifact at the base of the left sylvian fissure likely reflects thrombus in previously identified left M2 occlusion (series 12001, image 24). Skull and upper cervical spine: Craniocervical junction within normal limits. Upper cervical spine normal. Bone marrow signal intensity within normal limits. No scalp soft tissue abnormality. Sinuses/Orbits: Globes orbital soft tissues within normal limits. Minimal layering opacity noted within the left maxillary sinus. Paranasal sinuses are otherwise largely clear. No mastoid effusion. Inner ear structures normal. Other: None. IMPRESSION: 1. Patchy small volume acute ischemic cortical and subcortical left MCA territory infarcts as above. No associated hemorrhage or mass effect. 2. Additional few small subcentimeter foci of diffusion abnormality involving the cortical gray matter of the right frontoparietal region, also suspicious for tiny acute  ischemic nonhemorrhagic infarcts. 3. Remote/chronic bilateral parieto-occipital infarcts, right greater than left. Electronically Signed   By: Jeannine Boga M.D.   On: 07/24/2017 01:06   Ct Cerebral Perfusion W Contrast  Result Date: 07/23/2017 CLINICAL DATA:  Acute presentation with slurred speech and bilateral arm numbness upon wakening at 0530 hours. Speech disturbance. 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 Question hyperdense left MCA branch. EXAM: CT ANGIOGRAPHY HEAD AND NECK CT PERFUSION BRAIN TECHNIQUE: Multidetector CT imaging of the head and neck was performed using the standard protocol during bolus administration of intravenous contrast. Multiplanar CT image reconstructions and MIPs were obtained to evaluate the vascular anatomy. Carotid stenosis measurements (when applicable) are obtained utilizing NASCET criteria, using the distal internal carotid diameter as the denominator. Multiphase CT imaging of the brain was performed following IV bolus contrast injection. Subsequent parametric perfusion maps were calculated using RAPID software. CONTRAST:  120m ISOVUE-370 IOPAMIDOL (ISOVUE-370) INJECTION 76% COMPARISON:  CT earlier same day.  MRI 11/11/2014. FINDINGS: CTA NECK FINDINGS Aortic arch: Minimal aortic atherosclerosis. No aneurysm or dissection. Branching pattern of the brachiocephalic vessels from the arch is normal with mild atherosclerotic change but no stenosis. Right carotid system: Common carotid artery shows some plaque along its course to the bifurcation but no stenosis or irregularity. Right ICA bifurcation shows soft and calcified plaque of the ICA bulb but without stenosis greater than diameter of the more distal cervical ICA. Left carotid system: Common carotid artery shows some plaque along its course to the bifurcation but no stenosis or irregularity. At the carotid bifurcation, there is atherosclerotic plaque both soft and calcified. Minimal diameter of the  distal bulb is 3.7 mm. Compared to an expected distal cervical ICA diameter of 4 mm, this indicates a 10-20% stenosis. There is focus soft plaque affecting the cervical ICA at the C1 level which narrows the luminal diameter to 2.4 mm, consistent with a 40% stenosis. I do not see obvious ulceration. Vertebral arteries: Left vertebral artery origin is low but widely patent.  There is calcified plaque at the right vertebral artery origin with stenosis estimated at 30-50%. Both vertebral arteries are small but otherwise patent through the cervical region to the foramen magnum. Skeleton: Mid cervical spondylosis. Other neck: No mass or lymphadenopathy. Possible posterior nasal polyp. Upper chest: Advanced emphysema. Review of the MIP images confirms the above findings CTA HEAD FINDINGS Anterior circulation: Both internal carotid arteries are patent through the skull base and siphon regions. There is advanced atherosclerotic disease in the carotid siphon regions with narrowing and irregularity. This is particularly severe on the left where the lumen appears barely patent. Additionally, there is a patent trigeminal artery on the left which is self shows atherosclerotic change in stenosis, giving the majority supply to the basilar tip. Right supraclinoid ICA regains a normal caliber. There is fetal origin of the right PCA. Right anterior and middle cerebral vessels are widely patent presently. On the left, supraclinoid ICA shows 50-70% stenosis. There is flow in the left anterior and middle cerebral vessels. M3 branch vessel occlusion in the region of the left posterior frontal brain. Posterior circulation: Both vertebral arteries are patent through the foramen magnum, supplying both picas. There is a tiny basilar artery which is patent to the level where the patent trigeminal artery supplies the distal basilar. Superior cerebellar and posterior cerebral vessels are patent, with fetal origin of the right PCA as noted above.  Venous sinuses: Patent and normal. Anatomic variants: See above for discussion of left trigeminal artery. Delayed phase: No abnormal enhancement. 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 Given this, the patient would be a candidate for consideration of embolectomy. M2-3 branch inclusion, though difficult to identify as pre CT a perfusion imaging causes vascular opacification. Nonstenotic atherosclerotic change at both carotid bifurcations. Focal soft plaque of the upper cervical ICA on the left at the C1 level with stenosis of 40% but no visible ulceration. Very severe atherosclerotic change of both carotid siphon regions with severe stenosis and irregularity. This could serve as a challenge for embolectomy. 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 These results were called by telephone at the time of interpretation on 07/23/2017 at 7:20 am to Dr. Charlesetta Ivory , who verbally acknowledged these results. Electronically Signed   By: Nelson Chimes M.D.   On: 07/23/2017 07:42   Dg Chest Port 1 View  Result Date: 07/23/2017 CLINICAL DATA:  Stroke. EXAM: PORTABLE CHEST 1 VIEW COMPARISON:  It CT 07/23/2017. FINDINGS: Mediastinum hilar structures normal. Lungs are clear. No pleural effusion or pneumothorax. Heart size normal. No acute bony abnormality. IMPRESSION: No acute cardiopulmonary disease. Electronically Signed   By: Marcello Moores  Register   On: 07/23/2017 16:40    Labs:  CBC: Recent Labs    07/23/17 0625 07/23/17 1007  WBC 11.4* 12.7*    HGB 15.5 15.0  HCT 47.1 44.7  PLT 307 267    COAGS: Recent Labs    07/23/17 0625  INR 0.93  APTT 30    BMP: Recent Labs    07/23/17 0625 07/23/17 1007  NA 136  --  K 3.9  --   CL 105  --   CO2 25  --   GLUCOSE 135*  --   BUN 6  --   CALCIUM 8.9  --   CREATININE 0.76 0.71  GFRNONAA >60 >60  GFRAA >60 >60    LIVER FUNCTION TESTS: Recent Labs    07/23/17 0625  BILITOT 0.7  AST 21  ALT 10*  ALKPHOS 44  PROT 6.7  ALBUMIN 4.1    Assessment and Plan:  Right facial droop, right-sided weakness, and aphemia. S/P diagnostic cerebral angiogram 07/23/2017. Speech improved, continue to work with speech therapy. Right groin intact. Appreciate and agree with Neurology management. Instructed patient to follow-up with Dr. Estanislado Pandy 2 weeks after discharge.  Electronically Signed: Earley Abide, PA-C 07/24/2017, 9:57 AM   I spent a total of 20 minutes at the the patient's bedside AND on the patient's hospital floor or unit, greater than 50% of which was counseling/coordinating care for right facial droop, right-sided weakness, and aphemia.

## 2017-07-24 NOTE — Progress Notes (Signed)
STROKE TEAM PROGRESS NOTE   SUBJECTIVE (INTERVAL HISTORY) His wife is at the bedside.  Overall he feels his condition is rapidly improving. Wife said yesterday he could not even speak, today he is able to speak well but still dysarthria due to right facial droop. No arm leg weakness.   OBJECTIVE Temp:  [98.1 F (36.7 C)-99 F (37.2 C)] 98.9 F (37.2 C) (04/10 1200) Pulse Rate:  [72-95] 81 (04/10 1500) Cardiac Rhythm: Normal sinus rhythm (04/10 0800) Resp:  [14-24] 23 (04/10 1500) BP: (130-182)/(69-103) 153/93 (04/10 1500) SpO2:  [94 %-99 %] 99 % (04/10 1500) Weight:  [161 lb 9.6 oz (73.3 kg)] 161 lb 9.6 oz (73.3 kg) (04/09 1805)  Recent Labs  Lab 07/23/17 1147  GLUCAP 130*   Recent Labs  Lab 07/23/17 0625 07/23/17 1007  NA 136  --   K 3.9  --   CL 105  --   CO2 25  --   GLUCOSE 135*  --   BUN 6  --   CREATININE 0.76 0.71  CALCIUM 8.9  --    Recent Labs  Lab 07/23/17 0625  AST 21  ALT 10*  ALKPHOS 44  BILITOT 0.7  PROT 6.7  ALBUMIN 4.1   Recent Labs  Lab 07/23/17 0625 07/23/17 1007  WBC 11.4* 12.7*  NEUTROABS 8.0*  --   HGB 15.5 15.0  HCT 47.1 44.7  MCV 94.7 93.5  PLT 307 267   Recent Labs  Lab 07/23/17 0625  TROPONINI <0.03   Recent Labs    07/23/17 0625  LABPROT 12.4  INR 0.93   No results for input(s): COLORURINE, LABSPEC, PHURINE, GLUCOSEU, HGBUR, BILIRUBINUR, KETONESUR, PROTEINUR, UROBILINOGEN, NITRITE, LEUKOCYTESUR in the last 72 hours.  Invalid input(s): APPERANCEUR     Component Value Date/Time   CHOL 105 07/24/2017 0343   TRIG 81 07/24/2017 0343   HDL 33 (L) 07/24/2017 0343   CHOLHDL 3.2 07/24/2017 0343   VLDL 16 07/24/2017 0343   LDLCALC 56 07/24/2017 0343   Lab Results  Component Value Date   HGBA1C 5.5 07/24/2017   No results found for: LABOPIA, [place], [place], [place], [facility], [place]  Recent Labs  Lab 07/23/17 0726  ETH <10    I have personally reviewed the radiological images below and agree with the  radiology interpretations.  Ct Angio Head W Or Wo Contrast  Result Date: 07/23/2017 CLINICAL DATA:  Acute presentation with slurred speech and bilateral arm numbness upon wakening at 0530 hours. Speech disturbance. 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 Question hyperdense left MCA branch. EXAM: CT ANGIOGRAPHY HEAD AND NECK CT PERFUSION BRAIN TECHNIQUE: Multidetector CT imaging of the head and neck was performed using the standard protocol during bolus administration of intravenous contrast. Multiplanar CT image reconstructions and MIPs were obtained to evaluate the vascular anatomy. Carotid stenosis measurements (when applicable) are obtained utilizing NASCET criteria, using the distal internal carotid diameter as the denominator. Multiphase CT imaging of the brain was performed following IV bolus contrast injection. Subsequent parametric perfusion maps were calculated using RAPID software. CONTRAST:  139m ISOVUE-370 IOPAMIDOL (ISOVUE-370) INJECTION 76% COMPARISON:  CT earlier same day.  MRI 11/11/2014. FINDINGS: CTA NECK FINDINGS Aortic arch: Minimal aortic atherosclerosis. No aneurysm or dissection. Branching pattern of the brachiocephalic vessels from the arch is normal with mild atherosclerotic change but no stenosis. Right carotid system: Common carotid artery shows some plaque along its course to the bifurcation but no stenosis or irregularity. Right ICA bifurcation shows soft and calcified plaque  of the ICA bulb but without stenosis greater than diameter of the more distal cervical ICA. Left carotid system: Common carotid artery shows some plaque along its course to the bifurcation but no stenosis or irregularity. At the carotid bifurcation, there is atherosclerotic plaque both soft and calcified. Minimal diameter of the distal bulb is 3.7 mm. Compared to an expected distal cervical ICA diameter of 4 mm, this indicates a 10-20% stenosis. There is focus soft plaque affecting the cervical  ICA at the C1 level which narrows the luminal diameter to 2.4 mm, consistent with a 40% stenosis. I do not see obvious ulceration. Vertebral arteries: Left vertebral artery origin is low but widely patent. There is calcified plaque at the right vertebral artery origin with stenosis estimated at 30-50%. Both vertebral arteries are small but otherwise patent through the cervical region to the foramen magnum. Skeleton: Mid cervical spondylosis. Other neck: No mass or lymphadenopathy. Possible posterior nasal polyp. Upper chest: Advanced emphysema. Review of the MIP images confirms the above findings CTA HEAD FINDINGS Anterior circulation: Both internal carotid arteries are patent through the skull base and siphon regions. There is advanced atherosclerotic disease in the carotid siphon regions with narrowing and irregularity. This is particularly severe on the left where the lumen appears barely patent. Additionally, there is a patent trigeminal artery on the left which is self shows atherosclerotic change in stenosis, giving the majority supply to the basilar tip. Right supraclinoid ICA regains a normal caliber. There is fetal origin of the right PCA. Right anterior and middle cerebral vessels are widely patent presently. On the left, supraclinoid ICA shows 50-70% stenosis. There is flow in the left anterior and middle cerebral vessels. M3 branch vessel occlusion in the region of the left posterior frontal brain. Posterior circulation: Both vertebral arteries are patent through the foramen magnum, supplying both picas. There is a tiny basilar artery which is patent to the level where the patent trigeminal artery supplies the distal basilar. Superior cerebellar and posterior cerebral vessels are patent, with fetal origin of the right PCA as noted above. Venous sinuses: Patent and normal. Anatomic variants: See above for discussion of left trigeminal artery. Delayed phase: No abnormal enhancement. 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 Given this, the patient would be a candidate for consideration of embolectomy. M2-3 branch inclusion, though difficult to identify as pre CT a perfusion imaging causes vascular opacification. Nonstenotic atherosclerotic change at both carotid bifurcations. Focal soft plaque of the upper cervical ICA on the left at the C1 level with stenosis of 40% but no visible ulceration. Very severe atherosclerotic change of both carotid siphon regions with severe stenosis and irregularity. This could serve as a challenge for embolectomy. 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 These results were called by telephone at the time of interpretation on 07/23/2017 at 7:20 am to Dr. ALCharlesetta Ivory who verbally acknowledged these results. Electronically Signed   By: MaNelson Chimes.D.   On: 07/23/2017 07:42   Ct Head Wo Contrast  Result Date: 07/23/2017 CLINICAL DATA:  52 y/o  M; slurred speech and right facial droop. EXAM: CT HEAD WITHOUT CONTRAST TECHNIQUE: Contiguous axial images were obtained from the base of the skull through the vertex without intravenous contrast. COMPARISON:  11/11/2014 CT and MRI of the head. FINDINGS: Brain: Moderate right parietal and small left parietal chronic infarctions. No acute infarct, hemorrhage, or focal mass effect of the brain identified. No extra-axial collection, hydrocephalus, effacement of basilar cisterns, or herniation. Vascular: Calcific atherosclerosis of carotid siphons. Density within the  proximal left M2 superior division (series 4, image 29). Skull: Normal. Negative for fracture or focal lesion. Sinuses/Orbits: Maxillary sinus mucosal thickening. Otherwise normally aerated paranasal sinuses and mastoid air cells. Orbits are unremarkable. Other: None. IMPRESSION: 1. No acute intracranial abnormality identified.  ASPECTS is 10. 2. Left M2 superior division dense vessel may represent a thrombus. CTA of the head recommended. 3. Right larger than left parietal lobe chronic infarctions. These results were called by telephone at the time of interpretation on 07/23/2017 at 6:27 am to Dr. Dahlia Client, who verbally acknowledged these results. Electronically Signed   By: Kristine Garbe M.D.   On: 07/23/2017 06:33   Ct Angio Neck W Or Wo Contrast  Result Date: 07/23/2017 CLINICAL DATA:  Acute presentation with slurred speech and bilateral arm numbness upon wakening at 0530 hours. Speech disturbance. 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 Question hyperdense left MCA branch. EXAM: CT ANGIOGRAPHY HEAD AND NECK CT PERFUSION BRAIN TECHNIQUE: Multidetector CT imaging of the head and neck was performed using the standard protocol during bolus administration of intravenous contrast. Multiplanar CT image reconstructions and MIPs were obtained to evaluate the vascular anatomy. Carotid stenosis measurements (when applicable) are obtained utilizing NASCET criteria, using the distal internal carotid diameter as the denominator. Multiphase CT imaging of the brain was performed following IV bolus contrast injection. Subsequent parametric perfusion maps were calculated using RAPID software. CONTRAST:  19m ISOVUE-370 IOPAMIDOL (ISOVUE-370) INJECTION 76% COMPARISON:  CT earlier same day.  MRI 11/11/2014. FINDINGS: CTA NECK FINDINGS Aortic arch: Minimal aortic atherosclerosis. No aneurysm or dissection. Branching pattern of the brachiocephalic vessels from the arch is normal with mild atherosclerotic  change but no stenosis. Right carotid system: Common carotid artery shows some plaque along its course to the bifurcation but no stenosis or irregularity. Right ICA bifurcation shows soft and calcified plaque of the ICA bulb but without stenosis greater than diameter of the more distal cervical ICA. Left carotid system: Common carotid artery shows some plaque along its course to the bifurcation but no stenosis or irregularity. At the carotid bifurcation, there is atherosclerotic plaque both soft and calcified. Minimal diameter of the distal bulb is 3.7 mm. Compared to an expected distal cervical ICA diameter of 4 mm, this indicates a 10-20% stenosis. There is focus soft plaque affecting the cervical ICA at the C1 level which narrows the luminal diameter to 2.4 mm, consistent with a 40% stenosis. I do not see obvious ulceration. Vertebral arteries: Left vertebral artery origin is low but widely patent. There is calcified plaque at the right vertebral artery origin with stenosis estimated at 30-50%. Both vertebral arteries are small but otherwise patent through the cervical region to the foramen magnum. Skeleton: Mid cervical spondylosis. Other neck: No mass or lymphadenopathy. Possible posterior nasal polyp. Upper chest: Advanced emphysema. Review of the MIP images confirms the above findings CTA HEAD FINDINGS Anterior circulation: Both internal carotid arteries are patent through the skull base and siphon regions. There is advanced atherosclerotic disease in the  carotid siphon regions with narrowing and irregularity. This is particularly severe on the left where the lumen appears barely patent. Additionally, there is a patent trigeminal artery on the left which is self shows atherosclerotic change in stenosis, giving the majority supply to the basilar tip. Right supraclinoid ICA regains a normal caliber. There is fetal origin of the right PCA. Right anterior and middle cerebral vessels are widely patent presently.  On the left, supraclinoid ICA shows 50-70% stenosis. There is flow in the left anterior and middle cerebral vessels. M3 branch vessel occlusion in the region of the left posterior frontal brain. Posterior circulation: Both vertebral arteries are patent through the foramen magnum, supplying both picas. There is a tiny basilar artery which is patent to the level where the patent trigeminal artery supplies the distal basilar. Superior cerebellar and posterior cerebral vessels are patent, with fetal origin of the right PCA as noted above. Venous sinuses: Patent and normal. Anatomic variants: See above for discussion of left trigeminal artery. Delayed phase: No abnormal enhancement. 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 Given this, the patient would be a candidate for consideration of embolectomy. M2-3 branch inclusion, though difficult to identify as pre CT a perfusion imaging causes vascular opacification. Nonstenotic atherosclerotic change at both carotid bifurcations. Focal soft plaque of the upper cervical ICA on the left at the C1 level with stenosis of 40% but no visible ulceration. Very severe atherosclerotic change of both carotid siphon regions with severe stenosis and irregularity. This could serve as a challenge for embolectomy. 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 These results were called by telephone at the time of interpretation on 07/23/2017 at 7:20 am to Dr. ALCharlesetta Ivory who verbally acknowledged these results.  Electronically Signed   By: MaNelson Chimes.D.   On: 07/23/2017 07:42   Mr Brain Wo Contrast  Result Date: 07/24/2017 CLINICAL DATA:  Initial evaluation for acute stroke. EXAM: MRI HEAD WITHOUT CONTRAST TECHNIQUE: Multiplanar, multiecho pulse sequences of the brain and surrounding structures were obtained without intravenous contrast. COMPARISON:  Comparison made with prior studies from 07/23/2017. FINDINGS: Brain: Mild diffuse prominence of the CSF containing spaces compatible with generalized age-related cerebral atrophy. No significant cerebral white matter changes for age. Scattered areas of encephalomalacia with gliosis present within the right parieto-occipital and left occipital regions consistent with remote ischemic infarcts. Minimal chronic susceptibility artifact seen about these infarcts at the right parietal region. Additional small remote lacunar infarct noted at the left caudate head. Patchy small volume acute ischemic left MCA territory infarcts seen involving the cortical gray matter and underlying subcortical white matter of the left mid and posterior frontal lobe. Patchy involvement of the mid/posterior left insula, with a few foci of diffusion abnormality within the left basal ganglia. Largest area of infarct present at the left insula and measures approximately 12 mm. No associated hemorrhage or mass effect. Additional few subcentimeter foci of diffusion abnormality involves the cortical gray matter of the right posterior frontal parietal region, immediately anterior to the chronic right parietal infarct (series 5001,  image 72). Findings also suspicious for possible additional tiny infarcts, although artifact could conceivably be considered given the adjacent chronic infarction. No associated hemorrhage or mass effect. No other evidence for acute or subacute ischemia. Gray-white matter differentiation otherwise maintained. No evidence for acute intracranial hemorrhage. No mass lesion, midline  shift or mass effect. No hydrocephalus. No extra-axial fluid collection. Major dural sinuses are grossly patent. Pituitary gland suprasellar region normal. Midline structures intact and normal. Vascular: Major intracranial vascular flow voids are maintained at the skull base. Focus of susceptibility artifact at the base of the left sylvian fissure likely reflects thrombus in previously identified left M2 occlusion (series 12001, image 24). Skull and upper cervical spine: Craniocervical junction within normal limits. Upper cervical spine normal. Bone marrow signal intensity within normal limits. No scalp soft tissue abnormality. Sinuses/Orbits: Globes orbital soft tissues within normal limits. Minimal layering opacity noted within the left maxillary sinus. Paranasal sinuses are otherwise largely clear. No mastoid effusion. Inner ear structures normal. Other: None. IMPRESSION: 1. Patchy small volume acute ischemic cortical and subcortical left MCA territory infarcts as above. No associated hemorrhage or mass effect. 2. Additional few small subcentimeter foci of diffusion abnormality involving the cortical gray matter of the right frontoparietal region, also suspicious for tiny acute ischemic nonhemorrhagic infarcts. 3. Remote/chronic bilateral parieto-occipital infarcts, right greater than left. Electronically Signed   By: Jeannine Boga M.D.   On: 07/24/2017 01:06   Ct Cerebral Perfusion W Contrast  Result Date: 07/23/2017 CLINICAL DATA:  Acute presentation with slurred speech and bilateral arm numbness upon wakening at 0530 hours. Speech disturbance. 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 Question hyperdense left MCA branch. EXAM: CT ANGIOGRAPHY HEAD AND NECK CT PERFUSION BRAIN TECHNIQUE: Multidetector CT imaging of the head and neck was performed using the standard protocol during bolus administration of intravenous contrast. Multiplanar CT image reconstructions and MIPs were obtained to  evaluate the vascular anatomy. Carotid stenosis measurements (when applicable) are obtained utilizing NASCET criteria, using the distal internal carotid diameter as the denominator. Multiphase CT imaging of the brain was performed following IV bolus contrast injection. Subsequent parametric perfusion maps were calculated using RAPID software. CONTRAST:  155m ISOVUE-370 IOPAMIDOL (ISOVUE-370) INJECTION 76% COMPARISON:  CT earlier same day.  MRI 11/11/2014. FINDINGS: CTA NECK FINDINGS Aortic arch: Minimal aortic atherosclerosis. No aneurysm or dissection. Branching pattern of the brachiocephalic vessels from the arch is normal with mild atherosclerotic change but no stenosis. Right carotid system: Common carotid artery shows some plaque along its course to the bifurcation but no stenosis or irregularity. Right ICA bifurcation shows soft and calcified plaque of the ICA bulb but without stenosis greater than diameter of the more distal cervical ICA. Left carotid system: Common carotid artery shows some plaque along its course to the bifurcation but no stenosis or irregularity. At the carotid bifurcation, there is atherosclerotic plaque both soft and calcified. Minimal diameter of the distal bulb is 3.7 mm. Compared to an expected distal cervical ICA diameter of 4 mm, this indicates a 10-20% stenosis. There is focus soft plaque affecting the cervical ICA at the C1 level which narrows the luminal diameter to 2.4 mm, consistent with a 40% stenosis. I do not see obvious ulceration. Vertebral arteries: Left vertebral artery origin is low but widely patent. There is calcified plaque at the right vertebral artery origin with stenosis estimated at 30-50%. Both vertebral arteries are small but otherwise patent through the cervical region to the foramen magnum. Skeleton: Mid cervical spondylosis. Other  neck: No mass or lymphadenopathy. Possible posterior nasal polyp. Upper chest: Advanced emphysema. Review of the MIP images  confirms the above findings CTA HEAD FINDINGS Anterior circulation: Both internal carotid arteries are patent through the skull base and siphon regions. There is advanced atherosclerotic disease in the carotid siphon regions with narrowing and irregularity. This is particularly severe on the left where the lumen appears barely patent. Additionally, there is a patent trigeminal artery on the left which is self shows atherosclerotic change in stenosis, giving the majority supply to the basilar tip. Right supraclinoid ICA regains a normal caliber. There is fetal origin of the right PCA. Right anterior and middle cerebral vessels are widely patent presently. On the left, supraclinoid ICA shows 50-70% stenosis. There is flow in the left anterior and middle cerebral vessels. M3 branch vessel occlusion in the region of the left posterior frontal brain. Posterior circulation: Both vertebral arteries are patent through the foramen magnum, supplying both picas. There is a tiny basilar artery which is patent to the level where the patent trigeminal artery supplies the distal basilar. Superior cerebellar and posterior cerebral vessels are patent, with fetal origin of the right PCA as noted above. Venous sinuses: Patent and normal. Anatomic variants: See above for discussion of left trigeminal artery. Delayed phase: No abnormal enhancement. 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 Given this, the patient would be a candidate for consideration of embolectomy. M2-3 branch inclusion, though difficult to identify as pre CT a perfusion imaging causes vascular  opacification. Nonstenotic atherosclerotic change at both carotid bifurcations. Focal soft plaque of the upper cervical ICA on the left at the C1 level with stenosis of 40% but no visible ulceration. Very severe atherosclerotic change of both carotid siphon regions with severe stenosis and irregularity. This could serve as a challenge for embolectomy. 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 These results were called by telephone at the time of interpretation on 07/23/2017 at 7:20 am to Dr. ALCharlesetta Ivory who verbally acknowledged these results. Electronically Signed   By: MaNelson Chimes.D.   On: 07/23/2017 07:42    Ir Angio Extracran Sel Com Carotid Innominate Uni Bilat Mod Sed  Result Date: 07/24/2017 CLINICAL DATA:  Severe dysarthria. Right-sided facial weakness. Abnormal CT angiogram of the head and neck. EXAM: IR ANGIO EXTRACRAN SELECT COM CAROTID INNOMINATE BILAT MOD SED; IR ANGIO VERTEBRAL SEL VERTEBRAL BILAT MOD SED COMPARISON:  CT angiogram of the head and neck of 07/23/2017. MEDICATIONS: Heparin 1000 units IV; no antibiotic was administered within 1 hour of the procedure. ANESTHESIA/SEDATION: Mac anesthesia per the Department of Anesthesiology at [facility] mg IV; Fentanyl 25 mcg IV Moderate Sedation Time:  As per mac anesthesia. . Marland KitchenONTRAST:  Isovue 300 approximately 65 cc. FLUOROSCOPY TIME:  Fluoroscopy Time: 7 minutes 20 seconds (883 mGy). COMPLICATIONS: None immediate. TECHNIQUE: Informed written consent was obtained from the patient after a thorough discussion of the procedural risks, benefits and alternatives. All questions were addressed. Maximal Sterile Barrier Technique was utilized including caps, mask, sterile gowns, sterile  gloves, sterile drape, hand hygiene and skin antiseptic. A timeout was performed prior to the initiation of the procedure. The right groin was  prepped and draped in the usual sterile fashion. Thereafter using modified Seldinger technique, transfemoral access into the right common femoral artery was obtained without difficulty. Over a 0.035 inch guidewire, a 5 French Pinnacle sheath was inserted. Through this, and also over 0.035 inch guidewire, a 5 [place] JB 1 catheter was advanced to the aortic arch region and selectively positioned in the right common carotid artery, the right vertebral artery, the left common carotid artery and the left vertebral artery. FINDINGS: The right common carotid arteriogram demonstrates the right external carotid artery to be mildly narrowed at its origin. Its branches are normally opacified. The right internal carotid artery at the bulb to the cranial skull base opacifies normally. The petrous segment is widely patent. There is a tapered severe stenosis of the proximal cavernous segment of 80-85% associated with mild poststenotic dilatation. Distal to this the distal cavernous and supraclinoid segments are widely patent. The right middle cerebral artery and the right anterior cerebral artery opacify into the capillary and venous phases. There is prompt filling via the anterior communicating artery of the left anterior cerebral artery A1 A2 segments, and also of the left MCA distribution opacifying faintly the inferior and the superior divisions. Inflow of unopacified blood is seen in the proximal middle cerebral artery to be described below. Also demonstrated is a dominant right posterior communicating artery opacifying the right posterior cerebral artery distribution. The origin of the right vertebral artery is normal. The vessel is seen to opacify to the cranial skull base. Wide patency is seen of the right posterior-inferior cerebellar artery and the right vertebrobasilar junction. A tapered hypoplastic right vertebrobasilar junction is seen distal to the right posterior-inferior cerebellar artery. The opacified portion of  the basilar artery, the superior cerebellar arteries and the anterior-inferior cerebellar arteries is noted into the capillary and venous phases. Nonvisualization of the posterior cerebral arteries is probably related inflow from the anterior circulation and also from the contralateral left vertebral artery. The left common carotid arteriogram demonstrates the left external carotid artery and its major branches to be widely patent. The left internal carotid artery at the bulb to the cranial skull base demonstrates wide patency. There is a mild smooth atherosclerotic plaque along the posterior wall of the bulb without acute ulcerations or significant stenosis. More distally, the vessel is seen to opacify normally to the cranial skull base. Wide patency is seen of the petrous segment. There is complete angiographic occlusion distal to a prominent vessel arising from the superomedial aspect of the left internal carotid artery which opacifies the distal basilar artery including the left posterior cerebral artery distribution, and partially the right posterior cerebral artery distribution and the superior cerebellar arteries. 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 There is delayed filling of the distal cavernous segment of the right internal carotid artery, and also the supraclinoid segments from a significantly prominent ophthalmic artery from collaterals of the external carotid artery in the orbital ethmoidal region. This results in the opacification of the right middle cerebral artery and the right anterior cerebral artery. Unopacified blood is seen within the middle cerebral artery from the contralateral right internal carotid artery via the anterior communicating artery as described earlier. The delayed capillary phase demonstrates suggestion of an occlusion of a perisylvian M3 branch of the superior division.  The delayed  capillary phase again demonstrates a moderate-sized area of hypoperfusion involving the posterior frontal and the anterior parietal cortical subcortical areas. The hypoplastic left vertebral artery origin is from the medial posterior aspect of the left subclavian artery. The vessel is seen to opacify to the cranial skull base. Opacification of the left vertebrobasilar junction and the left posterior-inferior cerebellar artery is noted. Distal flow is seen into the left vertebrobasilar junction beyond the origin of the left posterior-inferior cerebellar artery and into the basilar artery where it mixes with the unopacified blood from the contralateral vertebral artery. 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 Suggestion of occlusion of a M3 perisylvian branch of the superior division of the left middle cerebral artery. 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 PLAN: Follow-up in approximately 2 weeks post discharge to discuss the management considerations of the 80-85% stenosis of the right internal carotid artery cavernous segment. Electronically Signed   By: Luanne Bras M.D.   On: 07/23/2017 16:49   Ir Angio Vertebral Sel Vertebral Bilat Mod Sed  Result Date: 07/24/2017 CLINICAL DATA:  Severe dysarthria. Right-sided facial weakness. Abnormal CT angiogram of the head and neck. EXAM: IR ANGIO EXTRACRAN SELECT COM CAROTID INNOMINATE BILAT MOD SED; IR ANGIO VERTEBRAL SEL VERTEBRAL BILAT MOD SED COMPARISON:  CT angiogram of the head and  neck of 07/23/2017. MEDICATIONS: Heparin 1000 units IV; no antibiotic was administered within 1 hour of the procedure. ANESTHESIA/SEDATION: Mac anesthesia per the Department of Anesthesiology at [facility] 1 mg IV; Fentanyl 25 mcg IV Moderate Sedation Time:  As per mac anesthesia. Marland Kitchen CONTRAST:  Isovue 300 approximately 65 cc. FLUOROSCOPY TIME:  Fluoroscopy Time: 7 minutes 20 seconds (883 mGy). COMPLICATIONS: None immediate. TECHNIQUE: Informed written consent was obtained from the patient after a thorough discussion of the procedural risks, benefits and alternatives. All questions were addressed. Maximal Sterile Barrier Technique was utilized including caps, mask, sterile gowns, sterile gloves, sterile drape, hand hygiene and skin antiseptic. A timeout was performed prior to the initiation of the procedure. The right groin was prepped and draped in the usual sterile fashion. Thereafter using modified Seldinger technique, transfemoral access into the right common femoral artery was obtained without difficulty. Over a 0.035 inch guidewire, a 5 French Pinnacle sheath was inserted. Through this, and also over 0.035 inch guidewire, a 5 [place] JB 1 catheter was advanced to the aortic arch region and selectively positioned in the right common carotid artery, the right vertebral artery, the left common carotid artery and the left vertebral artery. FINDINGS: The right common carotid arteriogram demonstrates the right external carotid artery to be mildly narrowed at its origin. Its branches are normally opacified. The right internal carotid artery at the bulb to the cranial skull base opacifies normally. The petrous segment is widely patent. There is a tapered severe stenosis of the proximal cavernous segment of 80-85% associated with mild poststenotic dilatation. Distal to this the distal cavernous and supraclinoid segments are widely patent. The right middle cerebral artery and the right anterior cerebral  artery opacify into the capillary and venous phases. There is prompt filling via the anterior  communicating artery of the left anterior cerebral artery A1 A2 segments, and also of the left MCA distribution opacifying faintly the inferior and the superior divisions. Inflow of unopacified blood is seen in the proximal middle cerebral artery to be described below. Also demonstrated is a dominant right posterior communicating artery opacifying the right posterior cerebral artery distribution. The origin of the right vertebral artery is normal. The vessel is seen to opacify to the cranial skull base. Wide patency is seen of the right posterior-inferior cerebellar artery and the right vertebrobasilar junction. A tapered hypoplastic right vertebrobasilar junction is seen distal to the right posterior-inferior cerebellar artery. The opacified portion of the basilar artery, the superior cerebellar arteries and the anterior-inferior cerebellar arteries is noted into the capillary and venous phases. Nonvisualization of the posterior cerebral arteries is probably related inflow from the anterior circulation and also from the contralateral left vertebral artery. The left common carotid arteriogram demonstrates the left external carotid artery and its major branches to be widely patent. The left internal carotid artery at the bulb to the cranial skull base demonstrates wide patency. There is a mild smooth atherosclerotic plaque along the posterior wall of the bulb without acute ulcerations or significant stenosis. More distally, the vessel is seen to opacify normally to the cranial skull base. Wide patency is seen of the petrous segment. There is complete angiographic occlusion distal to a prominent vessel arising from the superomedial aspect of the left internal carotid artery which opacifies the distal basilar artery including the left posterior cerebral artery distribution, and partially the right posterior cerebral artery  distribution and the superior cerebellar arteries. 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 There is delayed filling of the distal cavernous segment of the right internal carotid artery, and also the supraclinoid segments from a significantly prominent ophthalmic artery from collaterals of the external carotid artery in the orbital ethmoidal region. This results in the opacification of the right middle cerebral artery and the right anterior cerebral artery. Unopacified blood is seen within the middle cerebral artery from the contralateral right internal carotid artery via the anterior communicating artery as described earlier. The delayed capillary phase demonstrates suggestion of an occlusion of a perisylvian M3 branch of the superior division. The delayed capillary phase again demonstrates a moderate-sized area of hypoperfusion involving the posterior frontal and the anterior parietal cortical subcortical areas. The hypoplastic left vertebral artery origin is from the medial posterior aspect of the left subclavian artery. The vessel is seen to opacify to the cranial skull base. Opacification of the left vertebrobasilar junction and the left posterior-inferior cerebellar artery is noted. Distal flow is seen into the left vertebrobasilar junction beyond the origin of the left posterior-inferior cerebellar artery and into the basilar artery where it mixes with the unopacified blood from the contralateral vertebral artery. 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 Suggestion of occlusion of a M3 perisylvian branch of the superior division of the left middle cerebral artery.  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 PLAN: Follow-up in approximately 2 weeks post discharge to discuss the  management considerations of the 80-85% stenosis of the right internal carotid artery cavernous segment. Electronically Signed   By: Luanne Bras M.D.   On: 07/23/2017 16:49   TTE  - Left ventricle: The cavity size was normal. Wall thickness was   normal. Systolic function was normal. The estimated ejection   fraction was in the range of 55% to 60%. Wall motion was normal;   there were no regional wall motion abnormalities. Left   ventricular diastolic function parameters were normal. - Mitral valve: Mildly thickened leaflets . There was trivial   regurgitation. - Left atrium: The atrium was normal in size. - Inferior vena cava: The vessel was normal in size. The   respirophasic diameter changes were in the normal range (>= 50%),   consistent with normal central venous pressure.   PHYSICAL EXAM  Temp:  [98.1 F (36.7 C)-99 F (37.2 C)] 98.9 F (37.2 C) (04/10 1200) Pulse Rate:  [72-95] 81 (04/10 1500) Resp:  [14-24] 23 (04/10 1500) BP: (130-182)/(69-103) 153/93 (04/10 1500) SpO2:  [94 %-99 %] 99 % (04/10 1500) Weight:  [161 lb 9.6 oz (73.3 kg)] 161 lb 9.6 oz (73.3 kg) (04/09 1805)  General - Well nourished, well developed, in no apparent distress.  Ophthalmologic - fundi not visualized due to noncooperation.  Cardiovascular - Regular rate and rhythm.  Mental Status -  Level of arousal and orientation to time, place, and person were intact. Language including expression, naming, repetition, comprehension was assessed and found intact.  Mild dysarthria Fund of Knowledge was assessed and was intact.  Cranial Nerves II - XII - II - Visual field intact  OU. III, IV, VI - Extraocular movements intact. V - mild right facial decreased light touch sensation. VII - right facial droop VIII - Hearing & vestibular intact bilaterally. X - Palate elevates symmetrically. XI - Chin turning & shoulder shrug intact bilaterally. XII - Tongue protrusion intact.  Motor Strength - The patient's strength was normal in all extremities and pronator drift was absent.  Bulk was normal and fasciculations were absent.   Motor Tone - Muscle tone was assessed at the neck and appendages and was normal.  Reflexes - The patient's reflexes were symmetrical in all extremities and he had no pathological reflexes.  Sensory - Light touch, temperature/pinprick were assessed and were symmetrical except right  fingertips decreased light touch sensation.    Coordination - The patient had normal movements in the hands with no ataxia or dysmetria.  Tremor was absent.  Gait and Station - deferred.   ASSESSMENT/PLAN Mr. Tytus Strahle is a 52 y.o. male with history of alcohol use, HLD, HTN, smoker, history of stroke admitted for slurred speech, right facial droop. No tPA given due to outside window.    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  However cardioembolic source cannot be completely ruled out  Resultant right facial droop and slurred speech  MRI bilateral MCA infarct, L>R.  Old bilateral parieto-occipital infarcts, R>L  CTA head and neck questionable M2/M3 branch occlusion, severe stenosis bilateral carotid siphon  DSA showed left ICA petrous segment occlusion with distal reconstitution, right 80% ICA petrous segment stenosis, bilateral VA hypoplastic  2D Echo EF 55-60%  Recommend outpatient 30-day Cardiac event monitoring to rule out A. fib  LDL 56  HgbA1c 5.5   Lovenox for VTE prophylaxis  Fall precautions  Diet regular Room service appropriate? Yes; Fluid consistency: Thin   aspirin 325 mg daily prior  to  admission, now on aspirin 325 mg daily and clopidogrel 75 mg daily.  Continue aspirin and Plavix for 3 months and then Plavix alone due to intracranial stenosis  Patient counseled to be compliant with his antithrombotic medications  Ongoing aggressive stroke risk factor management  Therapy recommendations: Pending  Disposition: Pending  Carotid stenosis  10/2014 CT head and neck showed bilateral siphon atherosclerosis, right ICA petrous segment atherosclerosis.  07/2017 showed severe stenosis bilateral siphon and left ICA petrous segment occluded with right ICA 80% petrous segment stenosis  Now on dual antiplatelet therapy  Pt will follow with Dr. Estanislado Pandy and he would like to consider right ICA stent  History of stroke  10/2014, blurry vision and diplopia for 3 days, MRI showed right PCA/MCA, right MCA, right MCA/ACA, left MCA/ACA and MCA/PCA punctate infarcts.  CT also showed old infarct right parietal lobe.  CT head and neck bilateral siphon atherosclerosis and right petrous segment ICA atherosclerosis.  LDL 97 A1c 5.7 EF 60-65% TEE unremarkable no PFO.  Patient put on aspirin 325 and pravastatin 40  Hypertension Stable Permissive hypertension (OK if <220/120) for 24-48 hours post stroke and then gradually normalized within 5-7 days.  Long term BP goal 130-150 due to intracranial stenosis/occlusion  Avoid hypotension  Hyperlipidemia  Home meds: Pravastatin 40  LDL 56, goal < 70  Now on pravastatin 40  Continue statin at discharge  Tobacco abuse  Current smoker  Smoking cessation counseling provided  Pt is willing to quit  Other Stroke Risk Factors  ETOH use  Other Active Problems  Leukocytosis  Hospital day # 1  This patient is critically ill due to bilateral carotid stenosis/occlusion, bilateral MCA infarcts and at significant risk of neurological worsening, death form carotid artery occlusion, recurrent stroke, hemorrhagic conversion, seizure. This  patient's care requires constant monitoring of vital signs, hemodynamics, respiratory and cardiac monitoring, review of multiple databases, neurological assessment, discussion with family, other specialists and medical decision making of high complexity. I spent 35 minutes of neurocritical care time in the care of this patient.  Rosalin Hawking, MD PhD Stroke Neurology 07/24/2017 4:17 PM    To contact Stroke Continuity provider, please refer to [email or web address] After hours, contact General Neurology

## 2017-07-24 NOTE — Evaluation (Signed)
Physical Therapy Evaluation Patient Details Name: Henry Boone MRN: [id number] DOB: 17-Aug-1965 Today's Date: 07/24/2017   History of Present Illness  52 y.o. male who presented to [facility] after waking up with slurred speech, right facial droop, and right sided weakness. PMHx includes CVA (2016), hypertension, hyperlipidemia, and ETOH abuse. CT head with CTA head neck, the latter revealing a left M2 occlusion. CTP revealed a 20 cc core in the left frontal lobe with penumbra of 23 cc.  Clinical Impression  Patient presents with numbness in R hand and speech/swallowing changes from stroke.  No current skilled PT needs as moving as pre-admission and no documentable fall risk per DGI/BERG.  Will sign off.  Agree with outpatient OT/SLP    Follow Up Recommendations No PT follow up    Equipment Recommendations  None recommended by PT    Recommendations for Other Services       Precautions / Restrictions        Mobility  Bed Mobility               General bed mobility comments: OOB in recliner upon arrival  Transfers   Equipment used: None Transfers: Sit to/from Stand Sit to Stand: Modified independent (Device/Increase time)            Ambulation/Gait Ambulation/Gait assistance: Independent Ambulation Distance (Feet): 300 Feet Assistive device: None Gait Pattern/deviations: Step-through pattern;WFL(Within Functional Limits)   Gait velocity interpretation: at or above normal speed for age/gender    Stairs Stairs: Yes Stairs assistance: Modified independent (Device/Increase time) Stair Management: One rail Left;Alternating pattern;Forwards Number of Stairs: 10    Wheelchair Mobility    Modified Rankin (Stroke Patients Only) Modified Rankin (Stroke Patients Only) Pre-Morbid Rankin Score: No significant disability Modified Rankin: Moderate disability     Balance Overall balance assessment: Independent                               Standardized  Balance Assessment Standardized Balance Assessment : Dynamic Gait Index;Berg Balance Test Berg Balance Test Sit to Stand: Able to stand without using hands and stabilize independently Standing Unsupported: Able to stand safely 2 minutes Sitting with Back Unsupported but Feet Supported on Floor or Stool: Able to sit safely and securely 2 minutes Stand to Sit: Sits safely with minimal use of hands Transfers: Able to transfer safely, minor use of hands Standing Unsupported with Eyes Closed: Able to stand 10 seconds safely Standing Ubsupported with Feet Together: Able to place feet together independently and stand 1 minute safely From Standing, Reach Forward with Outstretched Arm: Can reach confidently >25 cm (10") From Standing Position, Pick up Object from Floor: Able to pick up shoe safely and easily From Standing Position, Turn to Look Behind Over each Shoulder: Looks behind from both sides and weight shifts well Turn 360 Degrees: Able to turn 360 degrees safely in 4 seconds or less Standing Unsupported, Alternately Place Feet on Step/Stool: Able to stand independently and safely and complete 8 steps in 20 seconds Standing Unsupported, One Foot in Front: Able to place foot tandem independently and hold 30 seconds Standing on One Leg: Able to lift leg independently and hold > 10 seconds Total Score: 56 Dynamic Gait Index Level Surface: Normal Change in Gait Speed: Normal Gait with Horizontal Head Turns: Normal Gait with Vertical Head Turns: Normal Gait and Pivot Turn: Normal Step Over Obstacle: Normal Step Around Obstacles: Normal Steps: Mild Impairment Total Score: 23  Pertinent Vitals/Pain Pain Assessment: No/denies pain Faces Pain Scale: No hurt    Home Living Family/patient expects to be discharged to:: Private residence Living Arrangements: Spouse/significant other Available Help at Discharge: Available 24 hours/day Type of Home: House Home Access: Stairs to  enter Entrance Stairs-Rails: None Entrance Stairs-Number of Steps: 3 front and 4 at back(handrails at back door) Home Layout: One level Home Equipment: None Additional Comments: Significant other (girlfriend of 12 years) able to work from home and be available for support    Prior Function Level of Independence: Independent         Comments: ADLs, IADLs, 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 Enjoys cooking. 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     Hand Dominance   Dominant Hand: Left    Extremity/Trunk Assessment   Upper Extremity Assessment Upper Extremity Assessment: Defer to OT evaluation    Lower Extremity Assessment Lower Extremity Assessment: Overall WFL for tasks assessed       Communication   Communication: Expressive difficulties  Cognition Arousal/Alertness: Awake/alert Behavior During Therapy: WFL for tasks assessed/performed Overall Cognitive Status: Within Functional Limits for tasks assessed                                        General Comments      Exercises     Assessment/Plan    PT Assessment Patent does not need any further PT services  PT Problem List         PT Treatment Interventions      PT Goals (Current goals can be found in the Care Plan section)  Acute Rehab PT Goals Patient Stated Goal: Go home and return to PLOF PT Goal Formulation: All assessment and education complete, DC therapy    Frequency     Barriers to discharge        Co-evaluation               AM-PAC PT "6 Clicks" Daily Activity  Outcome Measure Difficulty turning over in bed (including adjusting bedclothes, sheets and blankets)?: None Difficulty moving from lying on back to sitting on the side of the bed? : None Difficulty sitting down on and standing up from a chair with arms (e.g., wheelchair, bedside commode, etc,.)?: None Help needed moving to and from a bed to chair (including a wheelchair)?: None Help  needed walking in hospital room?: None Help needed climbing 3-5 steps with a railing? : None 6 Click Score: 24    End of Session Equipment Utilized During Treatment: Gait belt Activity Tolerance: Patient tolerated treatment well Patient left: in chair;with call bell/phone within reach   PT Visit Diagnosis: Other symptoms and signs involving the nervous system (R29.898)    Time: 4709-6283 PT Time Calculation (min) (ACUTE ONLY): 24 min   Charges:   PT Evaluation $PT Eval Low Complexity: 1 Low PT Treatments $Gait Training: 8-22 mins   PT G CodesMagda Kiel, [place] [phone] 07/24/2017   Reginia Naas 07/24/2017, 2:38 PM

## 2017-07-24 NOTE — Evaluation (Signed)
Occupational Therapy Evaluation Patient Details Name: Henry Boone MRN: [id number] DOB: 12-Aug-1965 Today's Date: 07/24/2017    History of Present Illness 52 y.o. male who presented to [facility] after waking up with slurred speech, right facial droop, and right sided weakness. PMHx includes CVA (2016), hypertension, hyperlipidemia, and ETOH abuse. CT head with CTA head neck, the latter revealing a left M2 occlusion. CTP revealed a 20 cc core in the left frontal lobe with penumbra of 23 cc.   Clinical Impression   PTA, pt was living his significant other and was independent and working full time. Pt currently performing ADLs and functional mobility at a supervision-Min guard level. Presenting with decreased sensation in right hand, poor grasp/pinch strength, and decreased FM skills as seen during testing and functional tasks. Pt would benefit from further acute OT to facilitate safe dc. Recommend dc to home with follow up from neuro outpatient for further OT to optimize safety, independence with ADLs, and return to PLOF.      Follow Up Recommendations  Outpatient OT;Supervision - Intermittent(Neuro OP OT)    Equipment Recommendations  None recommended by OT    Recommendations for Other Services       Precautions / Restrictions Restrictions Weight Bearing Restrictions: No      Mobility Bed Mobility               General bed mobility comments: OOB in recliner upon arrival  Transfers Overall transfer level: Needs assistance Equipment used: None Transfers: Sit to/from Stand Sit to Stand: Supervision         General transfer comment: supervision for safety.    Balance Overall balance assessment: Needs assistance Sitting-balance support: No upper extremity supported;Feet supported Sitting balance-Leahy Scale: Good Sitting balance - Comments: Able to reach forward to adjust socks without difficulty or LOB   Standing balance support: No upper extremity supported;During  functional activity Standing balance-Leahy Scale: Fair                             ADL either performed or assessed with clinical judgement   ADL Overall ADL's : Needs assistance/impaired Eating/Feeding: Set up;Sitting   Grooming: Oral care;Brushing hair;Set up;Supervision/safety;Standing Grooming Details (indicate cue type and reason): Pt performing oral care and brushing hair. Pt dropping tooth paste and tooth paste cap several times during task demonstraitng increased grasp and pinch strength. Pt moving slow and purposfully due to deficits.  Upper Body Bathing: Set up;Supervision/ safety;Standing   Lower Body Bathing: Min guard;Sit to/from stand   Upper Body Dressing : Set up;Supervision/safety;Standing   Lower Body Dressing: Min guard;Sit to/from stand Lower Body Dressing Details (indicate cue type and reason): Able to adjust socks without difficulty Toilet Transfer: Min guard;Ambulation Toilet Transfer Details (indicate cue type and reason): Simulated to recliner         Functional mobility during ADLs: Min guard General ADL Comments: Pt performing ADLs and funcitonal mobility with supervision-Mi nGuard level. Pt presenting with decreased grasp/pinch strength and FM skills as seen during grooming tasks as sink. Pt aware of deficits and motivated to participate in therapy to return to PLOF.     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 Reporting numbness in finger and hands with decreased  sensation greater distally. Pt dropping grooming items showing decreased grasp/pinch strength RUE Sensation: decreased light touch RUE Coordination: decreased fine motor   Lower Extremity Assessment Lower Extremity Assessment: Defer to PT evaluation   Cervical / Trunk Assessment Cervical / Trunk Assessment: Normal   Communication Communication Communication: Expressive difficulties   Cognition Arousal/Alertness: Awake/alert Behavior During Therapy: WFL for tasks assessed/performed Overall Cognitive Status: Within Functional Limits for tasks assessed                                 General Comments: Pt able to answer questions and follow commands. Able to provide information able hospital admission, recognizing MD who is performing stent surgery, and discussing current deficits. Pt reports he doesn't feel    General Comments       Exercises     Shoulder Instructions      Home Living Family/patient expects to be discharged to:: Private residence Living Arrangements: Spouse/significant other Available Help at Discharge: Available 24 hours/day Type of Home: House Home Access: Stairs to enter [employer] of Steps: 3 front and 4 at back Entrance Stairs-Rails: None(Handrails at backdoor) Home Layout: One level     Bathroom Shower/Tub: Biomedical scientist: Standard     Home Equipment: None   Additional Comments: Significant other (girlfriend of 12 years) able to work from home and be available for support      Prior Functioning/Environment Level of Independence: Independent        Comments: ADLs, IADLs, 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 Enjoys cooking. Girlfriend and pt rub a cat rescue from their home        OT Problem List: Decreased activity tolerance;Impaired vision/perception;Impaired UE functional use      OT Treatment/Interventions: Self-care/ADL training;Therapeutic exercise;DME and/or AE  instruction;Therapeutic activities;Patient/family education;Visual/perceptual remediation/compensation    OT Goals(Current goals can be found in the care plan section) Acute Rehab OT Goals Patient Stated Goal: Go home and return to PLOF OT Goal Formulation: With patient Time For Goal Achievement: 08/07/17 Potential to Achieve Goals: Good ADL Goals Pt/caregiver will Perform Home Exercise Program: Increased strength;Right Upper extremity;Independently;With written HEP provided Additional ADL Goal #1: Pt will independently verbalize three safety strategies for decreased sensation during ADLs/IADLs. Additional ADL Goal #2: Pt will perform ADLs at Mod I level  OT Frequency: Min 2X/week   Barriers to D/C:            Co-evaluation              AM-PAC PT "6 Clicks" Daily Activity     Outcome Measure Help from another person eating meals?: None Help from another person taking care of personal grooming?: A Little Help from another person toileting, which includes using toliet, bedpan, or urinal?: A Little Help from another person bathing (including washing, rinsing, drying)?: A Little Help from another person to put on and taking off regular upper body clothing?: None Help from another person to put on and taking off regular lower body clothing?: A Little 6 Click Score: 20   End of Session Equipment Utilized During Treatment: Gait belt Nurse Communication: Mobility status  Activity Tolerance: Patient tolerated treatment well Patient left: in chair;with call bell/phone within reach;with nursing/sitter in room  OT Visit Diagnosis: Unsteadiness on feet (R26.81);Muscle weakness (generalized) (M62.81);Low vision, both eyes (H54.2)                Time: 8372-9021 OT Time Calculation (min): 22 min Charges:  OT General Charges $OT Visit: 1 Visit OT Evaluation $OT Eval Moderate Complexity: 1 Mod G-Codes:     [place] MSOT, OTR/L Acute Rehab Pager: [phone] Office:  [place] 07/24/2017, 9:47 AM

## 2017-07-24 NOTE — Progress Notes (Signed)
Modified Barium Swallow Progress Note  Patient Details  Name: Henry Boone MRN: [id number] Date of Birth: 1965/07/01  Today's Date: 07/24/2017  Modified Barium Swallow completed.  Full report located under Chart Review in the Imaging Section.  Brief recommendations include the following:  Clinical Impression   Pt presents with a mild oropharyngeal dysphagia characterized by reduced bolus control and premature spillage leading to inconsistent penetration with thin liquids. Pt had more consistent penetration with straw presentation. Pt was inconsistently sensate to penetration, with the majority of airway compromise not eliciting response, which may be due to shallow nature. Pt benefited from [place] verbal cues to cough to expel penetrates from laryngeal vestibule. With use of chin tuck, pt was able to contain spillage in the vallecula and prevent airway compromise. Pt's pharyngeal phase was overtly functional for all other consistencies tested. Recommend thin liquids and regular diet with use of chin tuck, aspiration precautions, no straws, and intermittent supervision. Medications administered whole in puree. SLP will follow-up to ensure compliance with compensatory strategies and aspiration precautions and safety with updated diet recommendations. Continue to recommend OP SLP upon D/C.     Swallow Evaluation Recommendations       SLP Diet Recommendations: Thin liquid;Regular solids   Liquid Administration via: Cup;No straw   Medication Administration: Whole meds with puree   Supervision: Patient able to self feed;Intermittent supervision to cue for compensatory strategies   Compensations: Slow rate;Small sips/bites;Chin tuck   Postural Changes: Seated upright at 90 degrees   Oral Care Recommendations: Oral care BID      [place] Niklaus Mamaril SLP Student Clinician   [place] Alydia Gosser 07/24/2017,3:30 PM

## 2017-07-24 NOTE — Progress Notes (Signed)
  Speech Language Pathology Treatment: Dysphagia;Cognitive-Linquistic  Patient Details Name: Henry Boone MRN: [id number] DOB: [birth year] Today's Date: 07/24/2017 Time: 6153-7943 SLP Time Calculation (min) (ACUTE ONLY): 27 min  Assessment / Plan / Recommendation Clinical Impression  Pt observed with one cough during thin liquid trials; however, pt had no overt signs concerning for aspiration with nectar thick liquids. Further, pt had intermittent coughing throughout session in the absence of PO intake that was not observed in previous session. Given inconsistent overt signs concerning for airway compromise, MBS scheduled for 11:30 this morning to objectively assess pt's oropharyngeal swallow ability. In the mean time continue to recommend nectar thick liquids and chopped diet with aspiration precautions and intermittent supervision.   Pt presents with moderately improved intelligibility of speech compared to previous session. SLP initiated script training, beginning with single words and phrases which patient practiced and repeated using visual cue of written text and Mod verbal cues to increase pt's awareness to mis-articulated words and to reinforce intelligibility strategies (slow, pause, loud, over-articulate).      HPI HPI: Henry Boone is a left handed 52 y.o. male who presented to [facility] after waking up with slurred speech, right facial droop, and right sided weakness. PMHx includes CVA, hypertension, hyperlipidemia, and ETOH abuse. CT head with CTA head neck, the latter revealing a left M2 occlusion. CTP revealed a 20 cc core in the left frontal lobe with penumbra of 23 cc. Pt currenlty NPO.       SLP Plan  Continue with current plan of care;MBS       Recommendations  Diet recommendations: Nectar-thick liquid;Dysphagia 2 (fine chop) Liquids provided via: Cup;Straw Medication Administration: Whole meds with puree Supervision: Intermittent supervision to cue for compensatory  strategies;Patient able to self feed Compensations: Slow rate;Small sips/bites Postural Changes and/or Swallow Maneuvers: Seated upright 90 degrees                Oral Care Recommendations: Oral care BID Follow up Recommendations: Outpatient SLP SLP Visit Diagnosis: Dysphagia, unspecified (R13.10);Dysarthria and anarthria (R47.1);Apraxia (R48.2) Plan: Continue with current plan of care;MBS       GO               Henry Boone SLP Student Clinician  Henry Boone 07/24/2017, 11:13 AM

## 2017-07-25 DIAGNOSIS — Z8673 Personal history of transient ischemic attack (TIA), and cerebral infarction without residual deficits: Secondary | ICD-10-CM

## 2017-07-25 DIAGNOSIS — I63513 Cerebral infarction due to unspecified occlusion or stenosis of bilateral middle cerebral arteries: Principal | ICD-10-CM

## 2017-07-25 DIAGNOSIS — F17200 Nicotine dependence, unspecified, uncomplicated: Secondary | ICD-10-CM | POA: Diagnosis present

## 2017-07-25 DIAGNOSIS — E785 Hyperlipidemia, unspecified: Secondary | ICD-10-CM | POA: Diagnosis present

## 2017-07-25 DIAGNOSIS — I6521 Occlusion and stenosis of right carotid artery: Secondary | ICD-10-CM | POA: Diagnosis present

## 2017-07-25 DIAGNOSIS — I10 Essential (primary) hypertension: Secondary | ICD-10-CM

## 2017-07-25 DIAGNOSIS — D72829 Elevated white blood cell count, unspecified: Secondary | ICD-10-CM | POA: Diagnosis present

## 2017-07-25 DIAGNOSIS — I6522 Occlusion and stenosis of left carotid artery: Secondary | ICD-10-CM | POA: Diagnosis present

## 2017-07-25 LAB — CBC
HEMATOCRIT: 44.5 % (ref 39.0–52.0)
Hemoglobin: 15 g/dL (ref 13.0–17.0)
MCH: 31.8 pg (ref 26.0–34.0)
MCHC: 33.7 g/dL (ref 30.0–36.0)
MCV: 94.5 fL (ref 78.0–100.0)
Platelets: 244 10*3/uL (ref 150–400)
RBC: 4.71 MIL/uL (ref 4.22–5.81)
RDW: 13.1 % (ref 11.5–15.5)
WBC: 8.6 10*3/uL (ref 4.0–10.5)

## 2017-07-25 LAB — BASIC METABOLIC PANEL
ANION GAP: 10 (ref 5–15)
BUN: 5 mg/dL — ABNORMAL LOW (ref 6–20)
CALCIUM: 9.2 mg/dL (ref 8.9–10.3)
CO2: 24 mmol/L (ref 22–32)
Chloride: 103 mmol/L (ref 101–111)
Creatinine, Ser: 0.79 mg/dL (ref 0.61–1.24)
Glucose, Bld: 178 mg/dL — ABNORMAL HIGH (ref 65–99)
Potassium: 4.2 mmol/L (ref 3.5–5.1)
Sodium: 137 mmol/L (ref 135–145)

## 2017-07-25 MED ORDER — LISINOPRIL 10 MG PO TABS
10.0000 mg | ORAL_TABLET | Freq: Every day | ORAL | Status: DC
  Administered 2017-07-25: 10 mg via ORAL
  Filled 2017-07-25: qty 1

## 2017-07-25 NOTE — Care Management Note (Signed)
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 From home with [name](sgo). PTA independent with ADL's, no DME usage.  Marjory Lies [facility])     [phone]      Action/Plan: Transition to home with outpatient OT and SLP to follow.  Pt states has transportation to home. Pt to follow up with [facility] in 4 week. Expected Discharge Date:  07/25/17               Expected Discharge Plan:  Home/Self Care  In-House Referral:     Discharge planning Services  CM Consult  Post Acute Care Choice:    Choice offered to:     DME Arranged:   N/A DME Agency:   N/A  HH Arranged:   N/A HH Agency:   N/A  Status of Service:  Completed, signed off  If discussed at [place] of Stay Meetings, dates discussed:    Additional Comments:  Sharin Mons, RN 07/25/2017, 2:39 PM

## 2017-07-25 NOTE — Discharge Summary (Addendum)
Stroke Discharge Summary  Patient ID: Henry Boone   MRN: [id number]      DOB: [birth year]  Date of Admission: 07/23/2017 Date of Discharge: 07/25/2017  Attending Physician:  Rosalin Hawking, MD, Stroke MD Consultant(s):     Olene Craven) Estanislado Pandy, MD (Interventional Neuroradiologist)  Patient's PCP:  Patient, No Pcp Per  DISCHARGE DIAGNOSIS:  Principal Problem:   Cerebrovascular accident (CVA) due to bilateral occlusion of middle cerebral arteries ([place]) Active Problems:   Carotid occlusion, left   Carotid stenosis, right   History of ischemic multifocal multiple vascular territories stroke   Essential hypertension   Hyperlipidemia   Tobacco use disorder   Leukocytosis   Past Medical History:  Diagnosis Date  . ETOH abuse   . Hyperlipidemia   . Hypertension   . Stroke [facility])    Past Surgical History:  Procedure Laterality Date  . IR ANGIO EXTRACRAN SEL COM CAROTID INNOMINATE UNI BILAT MOD SED  07/23/2017  . IR ANGIO VERTEBRAL SEL VERTEBRAL BILAT MOD SED  07/23/2017  . TEE WITHOUT CARDIOVERSION N/A 11/15/2014   Procedure: TRANSESOPHAGEAL ECHOCARDIOGRAM (TEE);  Surgeon: Minna Merritts, MD;  Location: [facility] ORS;  Service: Cardiovascular;  Laterality: N/A;    Allergies as of 07/25/2017   No Known Allergies     Medication List    TAKE these medications   aspirin 325 MG EC tablet Take 1 tablet (325 mg total) by mouth daily.   lisinopril 10 MG tablet Commonly known as:  PRINIVIL,ZESTRIL Take 10 mg by mouth daily.   multivitamin with minerals Tabs tablet Take 1 tablet by mouth daily.   pravastatin 40 MG tablet Commonly known as:  PRAVACHOL Take 1 tablet (40 mg total) by mouth daily.       LABORATORY STUDIES CBC    Component Value Date/Time   WBC 8.6 07/25/2017 0906   RBC 4.71 07/25/2017 0906   HGB 15.0 07/25/2017 0906   HCT 44.5 07/25/2017 0906   PLT 244 07/25/2017 0906   MCV 94.5 07/25/2017 0906   MCH 31.8 07/25/2017 0906   MCHC 33.7 07/25/2017 0906   RDW  13.1 07/25/2017 0906   LYMPHSABS 2.3 07/23/2017 0625   MONOABS 0.9 07/23/2017 0625   EOSABS 0.3 07/23/2017 0625   BASOSABS 0.0 07/23/2017 0625   CMP    Component Value Date/Time   NA 137 07/25/2017 0906   K 4.2 07/25/2017 0906   CL 103 07/25/2017 0906   CO2 24 07/25/2017 0906   GLUCOSE 178 (H) 07/25/2017 0906   BUN 5 (L) 07/25/2017 0906   CREATININE 0.79 07/25/2017 0906   CALCIUM 9.2 07/25/2017 0906   PROT 6.7 07/23/2017 0625   ALBUMIN 4.1 07/23/2017 0625   AST 21 07/23/2017 0625   ALT 10 (L) 07/23/2017 0625   ALKPHOS 44 07/23/2017 0625   BILITOT 0.7 07/23/2017 0625   GFRNONAA >60 07/25/2017 0906   GFRAA >60 07/25/2017 0906   COAGS Lab Results  Component Value Date   INR 0.93 07/23/2017   Lipid Panel    Component Value Date/Time   CHOL 105 07/24/2017 0343   TRIG 81 07/24/2017 0343   HDL 33 (L) 07/24/2017 0343   CHOLHDL 3.2 07/24/2017 0343   VLDL 16 07/24/2017 0343   LDLCALC 56 07/24/2017 0343   HgbA1C  Lab Results  Component Value Date   HGBA1C 5.5 07/24/2017   Urinalysis No results found for: COLORURINE, APPEARANCEUR, LABSPEC, PHURINE, GLUCOSEU, HGBUR, BILIRUBINUR, KETONESUR, PROTEINUR, UROBILINOGEN, NITRITE, LEUKOCYTESUR Urine Drug Screen No results  found for: LABOPIA, COCAINSCRNUR, LABBENZ, AMPHETMU, THCU, LABBARB  Alcohol Level    Component Value Date/Time   ETH <10 07/23/2017 0726     SIGNIFICANT DIAGNOSTIC STUDIES Ct Angio Head W Or Wo Contrast  Result Date: 07/23/2017 CLINICAL DATA:  Acute presentation with slurred speech and bilateral arm numbness upon wakening at 0530 hours. Speech disturbance. 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 Question hyperdense left MCA branch. EXAM: CT ANGIOGRAPHY HEAD AND NECK CT PERFUSION BRAIN TECHNIQUE: Multidetector CT imaging of the head and neck was performed using the standard protocol during bolus administration of intravenous contrast. Multiplanar CT image reconstructions and MIPs were obtained to  evaluate the vascular anatomy. Carotid stenosis measurements (when applicable) are obtained utilizing NASCET criteria, using the distal internal carotid diameter as the denominator. Multiphase CT imaging of the brain was performed following IV bolus contrast injection. Subsequent parametric perfusion maps were calculated using RAPID software. CONTRAST:  1104m ISOVUE-370 IOPAMIDOL (ISOVUE-370) INJECTION 76% COMPARISON:  CT earlier same day.  MRI 11/11/2014. FINDINGS: CTA NECK FINDINGS Aortic arch: Minimal aortic atherosclerosis. No aneurysm or dissection. Branching pattern of the brachiocephalic vessels from the arch is normal with mild atherosclerotic change but no stenosis. Right carotid system: Common carotid artery shows some plaque along its course to the bifurcation but no stenosis or irregularity. Right ICA bifurcation shows soft and calcified plaque of the ICA bulb but without stenosis greater than diameter of the more distal cervical ICA. Left carotid system: Common carotid artery shows some plaque along its course to the bifurcation but no stenosis or irregularity. At the carotid bifurcation, there is atherosclerotic plaque both soft and calcified. Minimal diameter of the distal bulb is 3.7 mm. Compared to an expected distal cervical ICA diameter of 4 mm, this indicates a 10-20% stenosis. There is focus soft plaque affecting the cervical ICA at the C1 level which narrows the luminal diameter to 2.4 mm, consistent with a 40% stenosis. I do not see obvious ulceration. Vertebral arteries: Left vertebral artery origin is low but widely patent. There is calcified plaque at the right vertebral artery origin with stenosis estimated at 30-50%. Both vertebral arteries are small but otherwise patent through the cervical region to the foramen magnum. Skeleton: Mid cervical spondylosis. Other neck: No mass or lymphadenopathy. Possible posterior nasal polyp. Upper chest: Advanced emphysema. Review of the MIP images  confirms the above findings CTA HEAD FINDINGS Anterior circulation: Both internal carotid arteries are patent through the skull base and siphon regions. There is advanced atherosclerotic disease in the carotid siphon regions with narrowing and irregularity. This is particularly severe on the left where the lumen appears barely patent. Additionally, there is a patent trigeminal artery on the left which is self shows atherosclerotic change in stenosis, giving the majority supply to the basilar tip. Right supraclinoid ICA regains a normal caliber. There is fetal origin of the right PCA. Right anterior and middle cerebral vessels are widely patent presently. On the left, supraclinoid ICA shows 50-70% stenosis. There is flow in the left anterior and middle cerebral vessels. M3 branch vessel occlusion in the region of the left posterior frontal brain. Posterior circulation: Both vertebral arteries are patent through the foramen magnum, supplying both picas. There is a tiny basilar artery which is patent to the level where the patent trigeminal artery supplies the distal basilar. Superior cerebellar and posterior cerebral vessels are patent, with fetal origin of the right PCA as noted above. Venous sinuses: Patent and normal. Anatomic variants: See above for  discussion of left trigeminal artery. Delayed phase: No abnormal enhancement. Review of the MIP images confirms the above findings CT Brain Perfusion Findings: CBF (<30%) Volume: 46m Perfusion (Tmax>6.0s) volume: 40 formL Mismatch Volume: 271mInfarction Location:Left posterior frontal brain IMPRESSION: Acute infarction in the left posterior frontal brain with region of CBF less than 30% measured at 21 cc and T-max greater than 6 seconds at 44 cc for a 23 cc region of at risk brain. Given this, the patient would be a candidate for consideration of embolectomy. M2-3 branch inclusion, though difficult to identify as pre CT a perfusion imaging causes vascular  opacification. Nonstenotic atherosclerotic change at both carotid bifurcations. Focal soft plaque of the upper cervical ICA on the left at the C1 level with stenosis of 40% but no visible ulceration. Very severe atherosclerotic change of both carotid siphon regions with severe stenosis and irregularity. This could serve as a challenge for embolectomy. 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 These results were called by telephone at the time of interpretation on 07/23/2017 at 7:20 am to Dr. ALCharlesetta Ivory who verbally acknowledged these results. Electronically Signed   By: MaNelson Chimes.D.   On: 07/23/2017 07:42   Ct Head Wo Contrast  Result Date: 07/23/2017 CLINICAL DATA:  [age]/o  M; slurred speech and right facial droop. EXAM: CT HEAD WITHOUT CONTRAST TECHNIQUE: Contiguous axial images were obtained from the base of the skull through the vertex without intravenous contrast. COMPARISON:  11/11/2014 CT and MRI of the head. FINDINGS: Brain: Moderate right parietal and small left parietal chronic infarctions. No acute infarct, hemorrhage, or focal mass effect of the brain identified. No extra-axial collection, hydrocephalus, effacement of basilar cisterns, or herniation. Vascular: Calcific atherosclerosis of carotid siphons. Density within the proximal left M2 superior division (series 4, image 29). Skull: Normal. Negative for fracture or focal lesion. Sinuses/Orbits: Maxillary sinus mucosal thickening. Otherwise normally aerated paranasal sinuses and mastoid air cells. Orbits are unremarkable. Other: None. IMPRESSION: 1. No acute intracranial abnormality identified.  ASPECTS is 10. 2. Left M2 superior division dense vessel may represent a thrombus. CTA of the head recommended. 3. Right larger than left parietal lobe chronic infarctions. These results were called by telephone at the time of interpretation  on 07/23/2017 at 6:27 am to Dr. WeDahlia Clientwho verbally acknowledged these results. Electronically Signed   By: LaKristine Garbe.D.   On: 07/23/2017 06:33   Ct Angio Neck W Or Wo Contrast  Result Date: 07/23/2017 CLINICAL DATA:  Acute presentation with slurred speech and bilateral arm numbness upon wakening at 0530 hours. Speech disturbance. 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 Question hyperdense left MCA branch. EXAM: CT ANGIOGRAPHY HEAD AND NECK CT PERFUSION BRAIN TECHNIQUE: Multidetector CT imaging of the head and neck was performed using the standard protocol during bolus administration of intravenous contrast. Multiplanar CT image reconstructions and MIPs were obtained to evaluate the vascular anatomy. Carotid stenosis measurements (when applicable) are obtained utilizing NASCET criteria, using the distal internal carotid diameter as the denominator. Multiphase CT imaging of the brain was performed following IV bolus contrast injection. Subsequent parametric perfusion maps were calculated using RAPID software. CONTRAST:  1008mSOVUE-370 IOPAMIDOL (ISOVUE-370) INJECTION 76% COMPARISON:  CT earlier same day.  MRI 11/11/2014. FINDINGS: CTA NECK FINDINGS Aortic arch: Minimal aortic atherosclerosis. No aneurysm or dissection. Branching pattern of the brachiocephalic vessels from the arch is normal with  mild atherosclerotic change but no stenosis. Right carotid system: Common carotid artery shows some plaque along its course to the bifurcation but no stenosis or irregularity. Right ICA bifurcation shows soft and calcified plaque of the ICA bulb but without stenosis greater than diameter of the more distal cervical ICA. Left carotid system: Common carotid artery shows some plaque along its course to the bifurcation but no stenosis or irregularity. At the carotid bifurcation, there is atherosclerotic plaque both soft and calcified. Minimal diameter of the distal bulb is 3.7 mm. Compared to  an expected distal cervical ICA diameter of 4 mm, this indicates a 10-20% stenosis. There is focus soft plaque affecting the cervical ICA at the C1 level which narrows the luminal diameter to 2.4 mm, consistent with a 40% stenosis. I do not see obvious ulceration. Vertebral arteries: Left vertebral artery origin is low but widely patent. There is calcified plaque at the right vertebral artery origin with stenosis estimated at 30-50%. Both vertebral arteries are small but otherwise patent through the cervical region to the foramen magnum. Skeleton: Mid cervical spondylosis. Other neck: No mass or lymphadenopathy. Possible posterior nasal polyp. Upper chest: Advanced emphysema. Review of the MIP images confirms the above findings CTA HEAD FINDINGS Anterior circulation: Both internal carotid arteries are patent through the skull base and siphon regions. There is advanced atherosclerotic disease in the carotid siphon regions with narrowing and irregularity. This is particularly severe on the left where the lumen appears barely patent. Additionally, there is a patent trigeminal artery on the left which is self shows atherosclerotic change in stenosis, giving the majority supply to the basilar tip. Right supraclinoid ICA regains a normal caliber. There is fetal origin of the right PCA. Right anterior and middle cerebral vessels are widely patent presently. On the left, supraclinoid ICA shows 50-70% stenosis. There is flow in the left anterior and middle cerebral vessels. M3 branch vessel occlusion in the region of the left posterior frontal brain. Posterior circulation: Both vertebral arteries are patent through the foramen magnum, supplying both picas. There is a tiny basilar artery which is patent to the level where the patent trigeminal artery supplies the distal basilar. Superior cerebellar and posterior cerebral vessels are patent, with fetal origin of the right PCA as noted above. Venous sinuses: Patent and normal.  Anatomic variants: See above for discussion of left trigeminal artery. Delayed phase: No abnormal enhancement. Review of the MIP images confirms the above findings CT Brain Perfusion Findings: CBF (<30%) Volume: 74m Perfusion (Tmax>6.0s) volume: 40 formL Mismatch Volume: 268mInfarction Location:Left posterior frontal brain IMPRESSION: Acute infarction in the left posterior frontal brain with region of CBF less than 30% measured at 21 cc and T-max greater than 6 seconds at 44 cc for a 23 cc region of at risk brain. Given this, the patient would be a candidate for consideration of embolectomy. M2-3 branch inclusion, though difficult to identify as pre CT a perfusion imaging causes vascular opacification. Nonstenotic atherosclerotic change at both carotid bifurcations. Focal soft plaque of the upper cervical ICA on the left at the C1 level with stenosis of 40% but no visible ulceration. Very severe atherosclerotic change of both carotid siphon regions with severe stenosis and irregularity. This could serve as a challenge for embolectomy. 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 These results were called by telephone at the time of interpretation on 07/23/2017 at 7:20 am to Dr. ALEbony Hail  WEBSTER , who verbally acknowledged these results. Electronically Signed   By: Nelson Chimes M.D.   On: 07/23/2017 07:42   Mr Brain Wo Contrast  Result Date: 07/24/2017 CLINICAL DATA:  Initial evaluation for acute stroke. EXAM: MRI HEAD WITHOUT CONTRAST TECHNIQUE: Multiplanar, multiecho pulse sequences of the brain and surrounding structures were obtained without intravenous contrast. COMPARISON:  Comparison made with prior studies from 07/23/2017. FINDINGS: Brain: Mild diffuse prominence of the CSF containing spaces compatible with generalized age-related cerebral atrophy. No significant cerebral white matter changes for age.  Scattered areas of encephalomalacia with gliosis present within the right parieto-occipital and left occipital regions consistent with remote ischemic infarcts. Minimal chronic susceptibility artifact seen about these infarcts at the right parietal region. Additional small remote lacunar infarct noted at the left caudate head. Patchy small volume acute ischemic left MCA territory infarcts seen involving the cortical gray matter and underlying subcortical white matter of the left mid and posterior frontal lobe. Patchy involvement of the mid/posterior left insula, with a few foci of diffusion abnormality within the left basal ganglia. Largest area of infarct present at the left insula and measures approximately 12 mm. No associated hemorrhage or mass effect. Additional few subcentimeter foci of diffusion abnormality involves the cortical gray matter of the right posterior frontal parietal region, immediately anterior to the chronic right parietal infarct (series 5001, image 72). Findings also suspicious for possible additional tiny infarcts, although artifact could conceivably be considered given the adjacent chronic infarction. No associated hemorrhage or mass effect. No other evidence for acute or subacute ischemia. Gray-white matter differentiation otherwise maintained. No evidence for acute intracranial hemorrhage. No mass lesion, midline shift or mass effect. No hydrocephalus. No extra-axial fluid collection. Major dural sinuses are grossly patent. Pituitary gland suprasellar region normal. Midline structures intact and normal. Vascular: Major intracranial vascular flow voids are maintained at the skull base. Focus of susceptibility artifact at the base of the left sylvian fissure likely reflects thrombus in previously identified left M2 occlusion (series 12001, image 24). Skull and upper cervical spine: Craniocervical junction within normal limits. Upper cervical spine normal. Bone marrow signal intensity within  normal limits. No scalp soft tissue abnormality. Sinuses/Orbits: Globes orbital soft tissues within normal limits. Minimal layering opacity noted within the left maxillary sinus. Paranasal sinuses are otherwise largely clear. No mastoid effusion. Inner ear structures normal. Other: None. IMPRESSION: 1. Patchy small volume acute ischemic cortical and subcortical left MCA territory infarcts as above. No associated hemorrhage or mass effect. 2. Additional few small subcentimeter foci of diffusion abnormality involving the cortical gray matter of the right frontoparietal region, also suspicious for tiny acute ischemic nonhemorrhagic infarcts. 3. Remote/chronic bilateral parieto-occipital infarcts, right greater than left. Electronically Signed   By: Jeannine Boga M.D.   On: 07/24/2017 01:06   Ct Cerebral Perfusion W Contrast  Result Date: 07/23/2017 CLINICAL DATA:  Acute presentation with slurred speech and bilateral arm numbness upon wakening at 0530 hours. Speech disturbance. 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 Question hyperdense left MCA branch. EXAM: CT ANGIOGRAPHY HEAD AND NECK CT PERFUSION BRAIN TECHNIQUE: Multidetector CT imaging of the head and neck was performed using the standard protocol during bolus administration of intravenous contrast. Multiplanar CT image reconstructions and MIPs were obtained to evaluate the vascular anatomy. Carotid stenosis measurements (when applicable) are obtained utilizing NASCET criteria, using the distal internal carotid diameter as the denominator. Multiphase CT imaging of the brain was performed following IV bolus contrast injection. Subsequent parametric perfusion maps  were calculated using RAPID software. CONTRAST:  161m ISOVUE-370 IOPAMIDOL (ISOVUE-370) INJECTION 76% COMPARISON:  CT earlier same day.  MRI 11/11/2014. FINDINGS: CTA NECK FINDINGS Aortic arch: Minimal aortic atherosclerosis. No aneurysm or dissection. Branching pattern of the  brachiocephalic vessels from the arch is normal with mild atherosclerotic change but no stenosis. Right carotid system: Common carotid artery shows some plaque along its course to the bifurcation but no stenosis or irregularity. Right ICA bifurcation shows soft and calcified plaque of the ICA bulb but without stenosis greater than diameter of the more distal cervical ICA. Left carotid system: Common carotid artery shows some plaque along its course to the bifurcation but no stenosis or irregularity. At the carotid bifurcation, there is atherosclerotic plaque both soft and calcified. Minimal diameter of the distal bulb is 3.7 mm. Compared to an expected distal cervical ICA diameter of 4 mm, this indicates a 10-20% stenosis. There is focus soft plaque affecting the cervical ICA at the C1 level which narrows the luminal diameter to 2.4 mm, consistent with a 40% stenosis. I do not see obvious ulceration. Vertebral arteries: Left vertebral artery origin is low but widely patent. There is calcified plaque at the right vertebral artery origin with stenosis estimated at 30-50%. Both vertebral arteries are small but otherwise patent through the cervical region to the foramen magnum. Skeleton: Mid cervical spondylosis. Other neck: No mass or lymphadenopathy. Possible posterior nasal polyp. Upper chest: Advanced emphysema. Review of the MIP images confirms the above findings CTA HEAD FINDINGS Anterior circulation: Both internal carotid arteries are patent through the skull base and siphon regions. There is advanced atherosclerotic disease in the carotid siphon regions with narrowing and irregularity. This is particularly severe on the left where the lumen appears barely patent. Additionally, there is a patent trigeminal artery on the left which is self shows atherosclerotic change in stenosis, giving the majority supply to the basilar tip. Right supraclinoid ICA regains a normal caliber. There is fetal origin of the right PCA.  Right anterior and middle cerebral vessels are widely patent presently. On the left, supraclinoid ICA shows 50-70% stenosis. There is flow in the left anterior and middle cerebral vessels. M3 branch vessel occlusion in the region of the left posterior frontal brain. Posterior circulation: Both vertebral arteries are patent through the foramen magnum, supplying both picas. There is a tiny basilar artery which is patent to the level where the patent trigeminal artery supplies the distal basilar. Superior cerebellar and posterior cerebral vessels are patent, with fetal origin of the right PCA as noted above. Venous sinuses: Patent and normal. Anatomic variants: See above for discussion of left trigeminal artery. Delayed phase: No abnormal enhancement. Review of the MIP images confirms the above findings CT Brain Perfusion Findings: CBF (<30%) Volume: 249mPerfusion (Tmax>6.0s) volume: 40 formL Mismatch Volume: 2376mnfarction Location:Left posterior frontal brain IMPRESSION: Acute infarction in the left posterior frontal brain with region of CBF less than 30% measured at 21 cc and T-max greater than 6 seconds at 44 cc for a 23 cc region of at risk brain. Given this, the patient would be a candidate for consideration of embolectomy. M2-3 branch inclusion, though difficult to identify as pre CT a perfusion imaging causes vascular opacification. Nonstenotic atherosclerotic change at both carotid bifurcations. Focal soft plaque of the upper cervical ICA on the left at the C1 level with stenosis of 40% but no visible ulceration. Very severe atherosclerotic change of both carotid siphon regions with severe stenosis and irregularity. This could  serve as a challenge for embolectomy. 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 These results were called by telephone at the time of interpretation on 07/23/2017 at 7:20 am to  Dr. Charlesetta Ivory , who verbally acknowledged these results. Electronically Signed   By: Nelson Chimes M.D.   On: 07/23/2017 07:42   Dg Chest Port 1 View  Result Date: 07/23/2017 CLINICAL DATA:  Stroke. EXAM: PORTABLE CHEST 1 VIEW COMPARISON:  It CT 07/23/2017. FINDINGS: Mediastinum hilar structures normal. Lungs are clear. No pleural effusion or pneumothorax. Heart size normal. No acute bony abnormality. IMPRESSION: No acute cardiopulmonary disease. Electronically Signed   By: Marcello Moores  Register   On: 07/23/2017 16:40   Dg Swallowing Func-speech Pathology  Result Date: 07/24/2017 Objective Swallowing Evaluation: Type of Study: Bedside Swallow Evaluation  Patient Details Name: Henry Boone MRN: [id number] Date of Birth: Jun 15, 1965 Today's Date: 07/24/2017 Time: SLP Start Time (ACUTE ONLY): 1141 -SLP Stop Time (ACUTE ONLY): 1203 SLP Time Calculation (min) (ACUTE ONLY): 22 min Past Medical History: Past Medical History: Diagnosis Date . ETOH abuse  . Hyperlipidemia  . Hypertension  . Stroke [facility])  Past Surgical History: Past Surgical History: Procedure Laterality Date . TEE WITHOUT CARDIOVERSION N/A 11/15/2014  Procedure: TRANSESOPHAGEAL ECHOCARDIOGRAM (TEE);  Surgeon: Minna Merritts, MD;  Location: [facility] ORS;  Service: Cardiovascular;  Laterality: N/A; HPI: Jaion Lagrange is a left handed 52 y.o. male who presented to [facility] after waking up with slurred speech, right facial droop, and right sided weakness. PMHx includes CVA, hypertension, hyperlipidemia, and ETOH abuse. CT head with CTA head neck, the latter revealing a left M2 occlusion. CTP revealed a 20 cc core in the left frontal lobe with penumbra of 23 cc. Pt currenlty NPO.  Subjective: Pt is alert and cooperative to assessment Assessment / Plan / Recommendation CHL IP CLINICAL IMPRESSIONS 07/24/2017 Clinical Impression Pt presents with a mild oropharyngeal dysphagia characterized by reduced bolus control and premature spillage leading to inconsistent penetration  with thin liquids. Pt had more consistent penetration with straw presentation. Pt was inconsistently sensate to penetration, with the majority of airway compromise not eliciting response, which may be due to shallow nature. Pt benefited from [place] verbal cues to cough to expel penetrates from laryngeal vestibule. With use of chin tuck, pt was able to contain spillage in the vallecula and prevent airway compromise. Pt's pharyngeal phase was overtly functional for all other consistencies tested. Recommend thin liquids and regular diet with use of chin tuck, aspiration precautions, no straws, and intermittent supervision. Medications administered whole in puree. SLP will follow-up to ensure compliance with compensatory strategies and aspiration precautions and safety with updated diet recommendations. Continue to recommend OP SLP upon D/C.   SLP Visit Diagnosis Dysphagia, oropharyngeal phase (R13.12) Attention and concentration deficit following -- Frontal lobe and executive function deficit following -- Impact on safety and function Moderate aspiration risk   CHL IP TREATMENT RECOMMENDATION 07/24/2017 Treatment Recommendations Therapy as outlined in treatment plan below   Prognosis 07/24/2017 Prognosis for Safe Diet Advancement Good Barriers to Reach Goals -- Barriers/Prognosis Comment -- CHL IP DIET RECOMMENDATION 07/24/2017 SLP Diet Recommendations Thin liquid;Regular solids Liquid Administration via Cup;No straw Medication Administration Whole meds with puree Compensations Slow rate;Small sips/bites;Chin tuck Postural Changes Seated upright at 90 degrees   CHL IP OTHER RECOMMENDATIONS 07/24/2017 Recommended Consults -- Oral Care Recommendations Oral care BID Other Recommendations --   CHL IP FOLLOW  UP RECOMMENDATIONS 07/24/2017 Follow up Recommendations Outpatient SLP   CHL IP FREQUENCY AND DURATION 07/24/2017 Speech Therapy Frequency (ACUTE ONLY) min 2x/week Treatment Duration 2 weeks      CHL IP ORAL PHASE 07/24/2017  Oral Phase Impaired Oral - Pudding Teaspoon -- Oral - Pudding Cup -- Oral - Honey Teaspoon -- Oral - Honey Cup -- Oral - Nectar Teaspoon -- Oral - Nectar Cup WFL Oral - Nectar Straw -- Oral - Thin Teaspoon -- Oral - Thin Cup Premature spillage;Decreased bolus cohesion Oral - Thin Straw Premature spillage;Decreased bolus cohesion Oral - Puree WFL Oral - Mech Soft -- Oral - Regular WFL Oral - Multi-Consistency -- Oral - Pill -- Oral Phase - Comment --  CHL IP PHARYNGEAL PHASE 07/24/2017 Pharyngeal Phase Impaired Pharyngeal- Pudding Teaspoon -- Pharyngeal -- Pharyngeal- Pudding Cup -- Pharyngeal -- Pharyngeal- Honey Teaspoon -- Pharyngeal -- Pharyngeal- Honey Cup -- Pharyngeal -- Pharyngeal- Nectar Teaspoon -- Pharyngeal -- Pharyngeal- Nectar Cup WFL Pharyngeal -- Pharyngeal- Nectar Straw -- Pharyngeal -- Pharyngeal- Thin Teaspoon -- Pharyngeal -- Pharyngeal- Thin Cup Penetration/Aspiration before swallow;Compensatory strategies attempted (with notebox) Pharyngeal Material enters airway, remains ABOVE vocal cords and not ejected out Pharyngeal- Thin Straw Penetration/Aspiration before swallow Pharyngeal Material enters airway, remains ABOVE vocal cords and not ejected out Pharyngeal- Puree WFL Pharyngeal -- Pharyngeal- Mechanical Soft -- Pharyngeal -- Pharyngeal- Regular WFL Pharyngeal -- Pharyngeal- Multi-consistency -- Pharyngeal -- Pharyngeal- Pill -- Pharyngeal -- Pharyngeal Comment --  CHL IP CERVICAL ESOPHAGEAL PHASE 07/24/2017 Cervical Esophageal Phase WFL Pudding Teaspoon -- Pudding Cup -- Honey Teaspoon -- Honey Cup -- Nectar Teaspoon -- Nectar Cup -- Nectar Straw -- Thin Teaspoon -- Thin Cup -- Thin Straw -- Puree -- Mechanical Soft -- Regular -- Multi-consistency -- Pill -- Cervical Esophageal Comment -- No flowsheet data found. Henry Boone 07/24/2017, 3:57 PM  Note populated for [place] Jarrett, Student SLP Henry Boone, M.A. CCC-SLP [phone]             Ir Angio Extracran Sel Com Carotid  Innominate Uni Bilat Mod Sed  Result Date: 07/24/2017 CLINICAL DATA:  Severe dysarthria. Right-sided facial weakness. Abnormal CT angiogram of the head and neck. EXAM: IR ANGIO EXTRACRAN SELECT COM CAROTID INNOMINATE BILAT MOD SED; IR ANGIO VERTEBRAL SEL VERTEBRAL BILAT MOD SED COMPARISON:  CT angiogram of the head and neck of 07/23/2017. MEDICATIONS: Heparin 1000 units IV; no antibiotic was administered within 1 hour of the procedure. ANESTHESIA/SEDATION: Mac anesthesia per the Department of Anesthesiology at [place] 1 mg IV; Fentanyl 25 mcg IV Moderate Sedation Time:  As per mac anesthesia. Marland Kitchen CONTRAST:  Isovue 300 approximately 65 cc. FLUOROSCOPY TIME:  Fluoroscopy Time: 7 minutes 20 seconds (883 mGy). COMPLICATIONS: None immediate. TECHNIQUE: Informed written consent was obtained from the patient after a thorough discussion of the procedural risks, benefits and alternatives. All questions were addressed. Maximal Sterile Barrier Technique was utilized including caps, mask, sterile gowns, sterile gloves, sterile drape, hand hygiene and skin antiseptic. A timeout was performed prior to the initiation of the procedure. The right groin was prepped and draped in the usual sterile fashion. Thereafter using modified Seldinger technique, transfemoral access into the right common femoral artery was obtained without difficulty. Over a 0.035 inch guidewire, a 5 French Pinnacle sheath was inserted. Through this, and also over 0.035 inch guidewire, a 5 [place] JB 1 catheter was advanced to the aortic arch region and selectively positioned in the right common carotid artery, the right vertebral artery, the left common carotid  artery and the left vertebral artery. FINDINGS: The right common carotid arteriogram demonstrates the right external carotid artery to be mildly narrowed at its origin. Its branches are normally opacified. The right internal carotid artery at the bulb to the cranial skull base  opacifies normally. The petrous segment is widely patent. There is a tapered severe stenosis of the proximal cavernous segment of 80-85% associated with mild poststenotic dilatation. Distal to this the distal cavernous and supraclinoid segments are widely patent. The right middle cerebral artery and the right anterior cerebral artery opacify into the capillary and venous phases. There is prompt filling via the anterior communicating artery of the left anterior cerebral artery A1 A2 segments, and also of the left MCA distribution opacifying faintly the inferior and the superior divisions. Inflow of unopacified blood is seen in the proximal middle cerebral artery to be described below. Also demonstrated is a dominant right posterior communicating artery opacifying the right posterior cerebral artery distribution. The origin of the right vertebral artery is normal. The vessel is seen to opacify to the cranial skull base. Wide patency is seen of the right posterior-inferior cerebellar artery and the right vertebrobasilar junction. A tapered hypoplastic right vertebrobasilar junction is seen distal to the right posterior-inferior cerebellar artery. The opacified portion of the basilar artery, the superior cerebellar arteries and the anterior-inferior cerebellar arteries is noted into the capillary and venous phases. Nonvisualization of the posterior cerebral arteries is probably related inflow from the anterior circulation and also from the contralateral left vertebral artery. The left common carotid arteriogram demonstrates the left external carotid artery and its major branches to be widely patent. The left internal carotid artery at the bulb to the cranial skull base demonstrates wide patency. There is a mild smooth atherosclerotic plaque along the posterior wall of the bulb without acute ulcerations or significant stenosis. More distally, the vessel is seen to opacify normally to the cranial skull base. Wide patency  is seen of the petrous segment. There is complete angiographic occlusion distal to a prominent vessel arising from the superomedial aspect of the left internal carotid artery which opacifies the distal basilar artery including the left posterior cerebral artery distribution, and partially the right posterior cerebral artery distribution and the superior cerebellar arteries. 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 There is delayed filling of the distal cavernous segment of the right internal carotid artery, and also the supraclinoid segments from a significantly prominent ophthalmic artery from collaterals of the external carotid artery in the orbital ethmoidal region. This results in the opacification of the right middle cerebral artery and the right anterior cerebral artery. Unopacified blood is seen within the middle cerebral artery from the contralateral right internal carotid artery via the anterior communicating artery as described earlier. The delayed capillary phase demonstrates suggestion of an occlusion of a perisylvian M3 branch of the superior division. The delayed capillary phase again demonstrates a moderate-sized area of hypoperfusion involving the posterior frontal and the anterior parietal cortical subcortical areas. The hypoplastic left vertebral artery origin is from the medial posterior aspect of the left subclavian artery. The vessel is seen to opacify to the cranial skull base. Opacification of the left vertebrobasilar junction and the left posterior-inferior cerebellar artery is noted. Distal flow is seen into the left vertebrobasilar junction beyond the origin of the left posterior-inferior cerebellar artery and into the basilar artery where it mixes with the unopacified blood from the contralateral vertebral  artery. IMPRESSION: Angiographically occluded left internal carotid artery in the distal petrous  segment, with distal reconstitution of the cavernous segment and the supraclinoid segments with robust retrograde flow via the ophthalmic artery, from the external carotid artery branches in the orbital ethmoid region. Suggestion of occlusion of a M3 perisylvian branch of the superior division of the left middle cerebral artery. 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 PLAN: Follow-up in approximately 2 weeks post discharge to discuss the management considerations of the 80-85% stenosis of the right internal carotid artery cavernous segment. Electronically Signed   By: Luanne Bras M.D.   On: 07/23/2017 16:49   Ir Angio Vertebral Sel Vertebral Bilat Mod Sed  Result Date: 07/24/2017 CLINICAL DATA:  Severe dysarthria. Right-sided facial weakness. Abnormal CT angiogram of the head and neck. EXAM: IR ANGIO EXTRACRAN SELECT COM CAROTID INNOMINATE BILAT MOD SED; IR ANGIO VERTEBRAL SEL VERTEBRAL BILAT MOD SED COMPARISON:  CT angiogram of the head and neck of 07/23/2017. MEDICATIONS: Heparin 1000 units IV; no antibiotic was administered within 1 hour of the procedure. ANESTHESIA/SEDATION: Mac anesthesia per the Department of Anesthesiology at [place] 1 mg IV; Fentanyl 25 mcg IV Moderate Sedation Time:  As per mac anesthesia. Marland Kitchen CONTRAST:  Isovue 300 approximately 65 cc. FLUOROSCOPY TIME:  Fluoroscopy Time: 7 minutes 20 seconds (883 mGy). COMPLICATIONS: None immediate. TECHNIQUE: Informed written consent was obtained from the patient after a thorough discussion of the procedural risks, benefits and alternatives. All questions were addressed. Maximal Sterile Barrier Technique was utilized including caps, mask, sterile gowns, sterile gloves, sterile drape, hand hygiene and skin  antiseptic. A timeout was performed prior to the initiation of the procedure. The right groin was prepped and draped in the usual sterile fashion. Thereafter using modified Seldinger technique, transfemoral access into the right common femoral artery was obtained without difficulty. Over a 0.035 inch guidewire, a 5 French Pinnacle sheath was inserted. Through this, and also over 0.035 inch guidewire, a 5 [place] JB 1 catheter was advanced to the aortic arch region and selectively positioned in the right common carotid artery, the right vertebral artery, the left common carotid artery and the left vertebral artery. FINDINGS: The right common carotid arteriogram demonstrates the right external carotid artery to be mildly narrowed at its origin. Its branches are normally opacified. The right internal carotid artery at the bulb to the cranial skull base opacifies normally. The petrous segment is widely patent. There is a tapered severe stenosis of the proximal cavernous segment of 80-85% associated with mild poststenotic dilatation. Distal to this the distal cavernous and supraclinoid segments are widely patent. The right middle cerebral artery and the right anterior cerebral artery opacify into the capillary and venous phases. There is prompt filling via the anterior communicating artery of the left anterior cerebral artery A1 A2 segments, and also of the left MCA distribution opacifying faintly the inferior and the superior divisions. Inflow of unopacified blood is seen in the proximal middle cerebral artery to be described below. Also demonstrated is a dominant right posterior communicating artery opacifying the right posterior cerebral artery distribution. The origin of the right vertebral artery is normal. The vessel is seen to opacify to the cranial skull base. Wide patency is seen of the right posterior-inferior cerebellar artery and the right vertebrobasilar junction. A tapered hypoplastic right vertebrobasilar  junction is seen distal to the right posterior-inferior cerebellar artery. The opacified portion of the basilar artery, the superior cerebellar arteries and the anterior-inferior cerebellar arteries is noted into the capillary and venous phases. Nonvisualization of the posterior cerebral arteries is probably related inflow from the anterior circulation and also from the contralateral left vertebral artery. The left common carotid arteriogram demonstrates the left external carotid artery and its major branches to be widely patent. The left internal carotid artery at the bulb to the cranial skull base demonstrates wide patency. There is a mild smooth atherosclerotic plaque along the posterior wall of the bulb without acute ulcerations or significant stenosis. More distally, the vessel is seen to opacify normally to the cranial skull base. Wide patency is seen of the petrous segment. There is complete angiographic occlusion distal to a prominent vessel arising from the superomedial aspect of the left internal carotid artery which opacifies the distal basilar artery including the left posterior cerebral artery distribution, and partially the right posterior cerebral artery distribution and the superior cerebellar arteries. 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 There is delayed filling of the distal cavernous segment of the right internal carotid artery, and also the supraclinoid segments from a significantly prominent ophthalmic artery from collaterals of the external carotid artery in the orbital ethmoidal region. This results in the opacification of the right middle cerebral artery and the right anterior cerebral artery. Unopacified blood is seen within the middle cerebral artery from the contralateral right internal carotid artery via the anterior communicating artery as described earlier. The delayed capillary phase demonstrates  suggestion of an occlusion of a perisylvian M3 branch of the superior division. The delayed capillary phase again demonstrates a moderate-sized area of hypoperfusion involving the posterior frontal and the anterior parietal cortical subcortical areas. The hypoplastic left vertebral artery origin is from the medial posterior aspect of the left subclavian artery. The vessel is seen to opacify to the cranial skull base. Opacification of the left vertebrobasilar junction and the left posterior-inferior cerebellar artery is noted. Distal flow is seen into the left vertebrobasilar junction beyond the origin of the left posterior-inferior cerebellar artery and into the basilar artery where it mixes with the unopacified blood from the contralateral vertebral artery. 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 Suggestion of occlusion of a M3 perisylvian branch of the superior division of the left middle cerebral artery. 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 PLAN: Follow-up in approximately 2 weeks post discharge to discuss the management considerations of the 80-85% stenosis of the right internal carotid artery cavernous segment. Electronically Signed   By: Luanne Bras M.D.   On: 07/23/2017 16:49    TTE  - Left ventricle: The cavity size was normal. Wall thickness wasnormal. Systolic function was normal. The estimated ejectionfraction was in the range of 55% to 60%. Wall motion was normal;there were no regional wall motion abnormalities.  Leftventricular diastolic function parameters were normal. - Mitral valve: Mildly thickened leaflets . There was trivialregurgitation. - Left atrium: The atrium was normal in size. - Inferior vena cava:  The vessel was normal in size. Therespirophasic diameter changes were in the normal range (>= 50%),consistent with normal central venous pressure.  DSA Findings. 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 2.Bilateral hypoplastic vert arteries.   HISTORY OF PRESENT ILLNESS Catalino Plascencia is a left handed 52 y.o. male who presented to [facility] after waking up with slurred speech. (LKW at 2100  07/22/2017). Teleneurology consult at [facility] recommended CT head with CTA head neck, the latter revealing a left M2 occlusion. CTP revealed a 20 cc core in the left frontal lobe with penumbra of 23 cc. Teleneurologist felt that the patient's speech deficit was most consistent with aphemia, rather than a Broca's aphasia. The pt was out of the window for TPA. The patient was emergently transported to [facility] for possible thrombectomy. Angio found occluded L ICA, 80% stenosis R ICA. No emergent intervention. Admitted for further evaluation and treatment.     HOSPITAL COURSE Mr. Nihaal Friesen is a 52 y.o. male with history of alcohol use, HLD, HTN, smoker, history of stroke admitted for slurred speech, right facial droop. No tPA given due to outside window.    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  However cardioembolic source cannot be completely ruled out  Resultant right facial droop and slurred speech  MRI bilateral MCA infarct, L>R.  Old bilateral parieto-occipital infarcts, R>L  CTA head and neck questionable M2/M3 branch occlusion, severe stenosis bilateral  carotid siphon  DSA showed left ICA petrous segment occlusion with distal reconstitution, right 80% ICA petrous segment stenosis, bilateral VA hypoplastic  2D Echo EF 55-60%  Recommend outpatient 30-day Cardiac event monitoring to rule out A. Fib. Requested set up from [facility]  LDL 56  HgbA1c 5.5   aspirin 325 mg daily prior to admission, now on aspirin 325 mg daily and clopidogrel 75 mg daily.  Continue aspirin and Plavix for 3 months and then Plavix alone due to intracranial stenosis  Patient counseled to be compliant with his antithrombotic medications  Ongoing aggressive stroke risk factor management  Therapy recommendations: OP OT and SLP  Disposition: return home  Carotid stenosis  10/2014 CT head and neck showed bilateral siphon atherosclerosis, right ICA petrous segment atherosclerosis.  07/2017 showed severe stenosis bilateral siphon and left ICA petrous segment occluded with right ICA 80% petrous segment stenosis  Now on dual antiplatelet therapy  DSA showed left ICA petrous segment occlusion with distal reconstitution, right 80% ICA petrous segment stenosis, bilateral VA hypoplastic  Pt will follow with Dr. Estanislado Pandy in 2 weeks to consider right ICA stent  History of stroke  10/2014, blurry vision and diplopia for 3 days, MRI showed right PCA/MCA, right MCA, right MCA/ACA, left MCA/ACA and MCA/PCA punctate infarcts.  CT also showed old infarct right parietal lobe.  CT head and neck bilateral siphon atherosclerosis and right petrous segment ICA atherosclerosis.  LDL 97 A1c 5.7 EF 60-65% TEE unremarkable no PFO.  Patient put on aspirin 325 and pravastatin 40  Hypertension  Stable  Permissive hypertension (OK if <220/120) for 24-48 hours post stroke and then gradually normalized within 5-7 days.  Long term BP goal 130-150 due to intracranial stenosis/occlusion  Avoid hypotension  Ok to resume home BP meds at discharge  Hyperlipidemia  Home meds:  Pravastatin 40  LDL 56, goal < 70  Now on pravastatin 40  Continue statin at discharge  Tobacco abuse  Current smoker  Smoking cessation counseling provided  Pt is willing to quit  Other Stroke Risk Factors  ETOH use  Other Active Problems  Leukocytosis, resolved 11.4->12.7->8.6   DISCHARGE EXAM Blood pressure 125/85, pulse 69, temperature 98.9 F (37.2 C), temperature source Oral, resp. rate 18, height 6' (1.829 m), weight 72.4 kg (159 lb 9.8 oz), SpO2 97 %. General - Well nourished, well developed, in no apparent distress. Cardiovascular - Regular rate and rhythm.  Mental Status -  Level of arousal and orientation to time, place, and person were intact. Language including expression, naming, repetition, comprehension was assessed and found intact.  Mild dysarthria Fund of Knowledge was assessed and was intact.  Cranial Nerves II - XII - II - Visual field intact OU. III, IV, VI - Extraocular movements intact. V - mild right facial decreased light touch sensation. VII - right facial droop VIII - Hearing & vestibular intact bilaterally. X - Palate elevates symmetrically. XI - Chin turning & shoulder shrug intact bilaterally. XII - Tongue protrusion intact.  Motor Strength - The patient's strength was normal in all extremities and pronator drift was absent.  Bulk was normal and fasciculations were absent.   Motor Tone - Muscle tone was assessed at the neck and appendages and was normal.  Reflexes - The patient's reflexes were symmetrical in all extremities and he had no pathological reflexes.  Sensory - Light touch, temperature/pinprick were assessed and were symmetrical except right  fingertips decreased light touch sensation and R perioral area   Coordination - The patient had normal movements in the hands with no ataxia or dysmetria.  Tremor was absent.  Gait and Station - steady, normal gait pattern  Discharge Diet   Fall precautions Diet regular  Room service appropriate? Yes; Fluid consistency: Thin liquids  DISCHARGE PLAN  Disposition:  Home  Outpatient OT and SLP  aspirin 325 mg daily and clopidogrel 75 mg daily  X 3 months then plavix alone for secondary stroke prevention.  Ongoing risk factor control by Primary Care Physician at time of discharge  Follow-up Primary Care MD in 2 weeks.  Follow-up Dr. Estanislado Pandy in 2 weeks  Follow-up in [facility] in 4 weeks, office to schedule an appointment.   40 minutes were spent preparing discharge.  Burnetta Sabin, MSN, APRN, ANVP-BC, AGPCNP-BC Advanced Practice Stroke Nurse [place] for Schedule & Pager information 07/25/2017 2:23 PM    ATTENDING NOTE: I reviewed above note and agree with the assessment and plan. I have made any additions or clarifications directly to the above note. Pt was seen and examined.   Pt doing well overnight, no acute event. Right facial droop and slurry speech further improved. Will follow up with Dr. Estanislado Pandy to consider stenting right ICA petrous segment in 2 weeks. BP goal 130-150. Still at high end of BP, will resume home lisinopril 60m. Leukocytosis resolved. Smoking cessation education provided. Follow up with [facility] in 4 weeks.   JRosalin Hawking MD PhD Stroke Neurology 07/25/2017 3:25 PM

## 2017-07-25 NOTE — Progress Notes (Signed)
Discharge instructions reviewed with patient/family. All questions answered at this time. Transport home by family.   Cameron Katayama, RN 

## 2017-07-25 NOTE — Progress Notes (Signed)
Occupational Therapy Treatment Patient Details Name: Powell Halbert MRN: [id number] DOB: 1965-11-19 Today's Date: 07/25/2017    History of present illness 52 y.o. male who presented to [facility] after waking up with slurred speech, right facial droop, and right sided weakness. PMHx includes CVA (2016), hypertension, hyperlipidemia, and ETOH abuse. CT head with CTA head neck, the latter revealing a left M2 occlusion. CTP revealed a 20 cc core in the left frontal lobe with penumbra of 23 cc.   OT comments  Making excellent progress. Demonstrates sensorimotor deficits with RUE. Continue to recommend follow up with outpt OT at the neuro outpt center to maximize functional level of independence and return to work. Educated on HEP and home safety. Wife present for education.   Follow Up Recommendations  Outpatient OT;Supervision - Intermittent    Equipment Recommendations  None recommended by OT    Recommendations for Other Services      Precautions / Restrictions         Mobility Bed Mobility Overal bed mobility: Modified Independent                Transfers Overall transfer level: Independent                    Balance Overall balance assessment: Independent                                         ADL either performed or assessed with clinical judgement   ADL                                         General ADL Comments: overall set up     Vision       Perception     Praxis      Cognition Arousal/Alertness: Awake/alert Behavior During Therapy: WFL for tasks assessed/performed Overall Cognitive Status: Within Functional Limits for tasks assessed                                 General Comments: Will further assesshigher leve skills adn calculations        Exercises Exercises: Other exercises Other Exercises Other Exercises: fine motor/coordination Other Exercises: BUE integrated activities Other  Exercises: safety/awareness of swensory deficits   Shoulder Instructions       General Comments      Pertinent Vitals/ Pain       Pain Assessment: No/denies pain  Home Living                                          Prior Functioning/Environment              Frequency  Min 2X/week        Progress Toward Goals  OT Goals(current goals can now be found in the care plan section)  Progress towards OT goals: Progressing toward goals  Acute Rehab OT Goals Patient Stated Goal: Go home and return to PLOF OT Goal Formulation: With patient Time For Goal Achievement: 08/07/17 Potential to Achieve Goals: Good ADL Goals Pt/caregiver will Perform Home Exercise Program: Increased strength;Right Upper extremity;Independently;With written HEP provided Additional ADL Goal #  1: Pt will independently verbalize three safety strategies for decreased sensation during ADLs/IADLs. Additional ADL Goal #2: Pt will perform ADLs at Mod I level  Plan Discharge plan remains appropriate    Co-evaluation                 AM-PAC PT "6 Clicks" Daily Activity     Outcome Measure   Help from another person eating meals?: None Help from another person taking care of personal grooming?: None Help from another person toileting, which includes using toliet, bedpan, or urinal?: None Help from another person bathing (including washing, rinsing, drying)?: None Help from another person to put on and taking off regular upper body clothing?: None Help from another person to put on and taking off regular lower body clothing?: None 6 Click Score: 24    End of Session    OT Visit Diagnosis: Muscle weakness (generalized) (M62.81);Low vision, both eyes (H54.2)   Activity Tolerance Patient tolerated treatment well   Patient Left in chair;with call bell/phone within reach;with family/visitor present   Nurse Communication Other (comment)(DC needs)        Time: 1410-1435 OT Time  Calculation (min): 25 min  Charges: OT General Charges $OT Visit: 1 Visit OT Treatments $Therapeutic Activity: 23-37 mins  [facility], OT/L  [phone] 07/25/2017   Javontay Vandam,HILLARY 07/25/2017, 3:12 PM

## 2017-07-25 NOTE — Progress Notes (Addendum)
Referring Physician(s): CODE STROKE  Supervising Physician: Luanne Bras  Patient Status:  [facility] - In-pt  Chief Complaint: CVA  Subjective:  CVA 07/23/2017. Presented as right facial droop, right-sided weakness, and aphemia.  Diagnostic cerebral angiogram 07/23/2017: 1.Occluded left ICA petrous cavjunction with distal reconstitution from orbito ethmoid branches of left ECA of the cavernouis and supraclinoid segs. 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 3.Bilateral hypoplastic vert arteries.  Patient sitting in chair. Speech continues to improve. Still complains of numbness in right fingertips. Demonstrates right facial droop. Denies headache, weakness, dizziness, tingling, vision changes, hearing changes, or tinnitus. Right groin c/d/i.  Allergies: Patient has no known allergies.  Medications: Prior to Admission medications   Medication Sig Start Date End Date Taking? Authorizing Provider  aspirin EC 325 MG EC tablet Take 1 tablet (325 mg total) by mouth daily. 11/15/14  Yes Aldean Jewett, MD  lisinopril (PRINIVIL,ZESTRIL) 10 MG tablet Take 10 mg by mouth daily. 06/30/17  Yes [provider]  Multiple Vitamin (MULTIVITAMIN WITH MINERALS) TABS tablet Take 1 tablet by mouth daily. 11/15/14  Yes Aldean Jewett, MD  pravastatin (PRAVACHOL) 40 MG tablet Take 1 tablet (40 mg total) by mouth daily. 11/15/14  Yes Aldean Jewett, MD     Vital Signs: BP (!) 172/87 (BP Location: Right Arm)   Pulse 70   Temp 98.5 F (36.9 C) (Oral)   Resp 18   Ht 6' (1.829 m)   Wt 159 lb 9.8 oz (72.4 kg)   SpO2 97%   BMI 21.65 kg/m   Physical Exam  Constitutional: He appears well-developed and well-nourished. No distress.  Cardiovascular: Normal rate, regular rhythm and normal heart sounds.  No murmur heard. Pulmonary/Chest: Effort normal and breath sounds normal. He has no wheezes.  Neurological:    Alert, awake, and oriented x3. Speech continues to improve and is clearer than yesterday, comprehension intact. PERRL bilaterally. EOMS intact bilaterally without nystagmus or subjective diplopia. Visual fields not assessed. Demonstrates right facial droop. Tongue deviates to right. Motor power symmetric proportional to effort. Pronator drift not assessed. Fine motor and coordination not assessed. Gait not assessed. Romberg not assessed. Heel to toe not assessed. Distal pulses 2+ bilaterally.  Skin: Skin is warm and dry.  Right groin incision soft without hematoma or active bleeding.  Psychiatric: He has a normal mood and affect. His behavior is normal. Judgment and thought content normal.  Nursing note and vitals reviewed.   Imaging: Ct Angio Head W Or Wo Contrast  Result Date: 07/23/2017 CLINICAL DATA:  Acute presentation with slurred speech and bilateral arm numbness upon wakening at 0530 hours. Speech disturbance. 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 Question hyperdense left MCA branch. EXAM: CT ANGIOGRAPHY HEAD AND NECK CT PERFUSION BRAIN TECHNIQUE: Multidetector CT imaging of the head and neck was performed using the standard protocol during bolus administration of intravenous contrast. Multiplanar CT image reconstructions and MIPs were obtained to evaluate the vascular anatomy. Carotid stenosis measurements (when applicable) are obtained utilizing NASCET criteria, using the distal internal carotid diameter as the denominator. Multiphase CT imaging of the brain was performed following IV bolus contrast injection. Subsequent parametric perfusion maps were calculated using RAPID software. CONTRAST:  167m ISOVUE-370 IOPAMIDOL (ISOVUE-370) INJECTION 76% COMPARISON:  CT earlier same day.  MRI 11/11/2014. FINDINGS: CTA NECK FINDINGS Aortic arch: Minimal aortic atherosclerosis. No aneurysm or dissection. Branching pattern of the brachiocephalic vessels from the arch is  normal with mild atherosclerotic change but no stenosis. Right carotid system: Common carotid artery shows some plaque along its course to the bifurcation but no stenosis or irregularity. Right ICA bifurcation shows soft and calcified plaque of the ICA bulb but without stenosis greater than diameter of the more distal cervical ICA. Left carotid system: Common carotid artery shows some plaque along its course to the bifurcation but no stenosis or irregularity. At the carotid bifurcation, there is atherosclerotic plaque both soft and calcified. Minimal diameter of the distal bulb is 3.7 mm. Compared to an expected distal cervical ICA diameter of 4 mm, this indicates a 10-20% stenosis. There is focus soft plaque affecting the cervical ICA at the C1 level which narrows the luminal diameter to 2.4 mm, consistent with a 40% stenosis. I do not see obvious ulceration. Vertebral arteries: Left vertebral artery origin is low but widely patent. There is calcified plaque at the right vertebral artery origin with stenosis estimated at 30-50%. Both vertebral arteries are small but otherwise patent through the cervical region to the foramen magnum. Skeleton: Mid cervical spondylosis. Other neck: No mass or lymphadenopathy. Possible posterior nasal polyp. Upper chest: Advanced emphysema. Review of the MIP images confirms the above findings CTA HEAD FINDINGS Anterior circulation: Both internal carotid arteries are patent through the skull base and siphon regions. There is advanced atherosclerotic disease in the carotid siphon regions with narrowing and irregularity. This is particularly severe on the left where the lumen appears barely patent. Additionally, there is a patent trigeminal artery on the left which is self shows atherosclerotic change in stenosis, giving the majority supply to the basilar tip. Right supraclinoid ICA regains a normal caliber. There is fetal origin of the right PCA. Right anterior and middle cerebral  vessels are widely patent presently. On the left, supraclinoid ICA shows 50-70% stenosis. There is flow in the left anterior and middle cerebral vessels. M3 branch vessel occlusion in the region of the left posterior frontal brain. Posterior circulation: Both vertebral arteries are patent through the foramen magnum, supplying both picas. There is a tiny basilar artery which is patent to the level where the patent trigeminal artery supplies the distal basilar. Superior cerebellar and posterior cerebral vessels are patent, with fetal origin of the right PCA as noted above. Venous sinuses: Patent and normal. Anatomic variants: See above for discussion of left trigeminal artery. Delayed phase: No abnormal enhancement. 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 Given this, the patient would be a candidate for consideration of embolectomy. M2-3 branch inclusion, though difficult to identify as pre CT a perfusion imaging causes vascular opacification. Nonstenotic atherosclerotic change at both carotid bifurcations. Focal soft plaque of the upper cervical ICA on the left at the C1 level with stenosis of 40% but no visible ulceration. Very severe atherosclerotic change of both carotid siphon regions with severe stenosis and irregularity. This could serve as a challenge for embolectomy. 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 These results were called by telephone at the time of interpretation on 07/23/2017 at 7:20 am to Dr.  Charlesetta Ivory , who verbally  acknowledged these results. Electronically Signed   By: Nelson Chimes M.D.   On: 07/23/2017 07:42   Ct Head Wo Contrast  Result Date: 07/23/2017 CLINICAL DATA:  52 y/o  M; slurred speech and right facial droop. EXAM: CT HEAD WITHOUT CONTRAST TECHNIQUE: Contiguous axial images were obtained from the base of the skull through the vertex without intravenous contrast. COMPARISON:  11/11/2014 CT and MRI of the head. FINDINGS: Brain: Moderate right parietal and small left parietal chronic infarctions. No acute infarct, hemorrhage, or focal mass effect of the brain identified. No extra-axial collection, hydrocephalus, effacement of basilar cisterns, or herniation. Vascular: Calcific atherosclerosis of carotid siphons. Density within the proximal left M2 superior division (series 4, image 29). Skull: Normal. Negative for fracture or focal lesion. Sinuses/Orbits: Maxillary sinus mucosal thickening. Otherwise normally aerated paranasal sinuses and mastoid air cells. Orbits are unremarkable. Other: None. IMPRESSION: 1. No acute intracranial abnormality identified.  ASPECTS is 10. 2. Left M2 superior division dense vessel may represent a thrombus. CTA of the head recommended. 3. Right larger than left parietal lobe chronic infarctions. These results were called by telephone at the time of interpretation on 07/23/2017 at 6:27 am to Dr. Dahlia Client, who verbally acknowledged these results. Electronically Signed   By: Kristine Garbe M.D.   On: 07/23/2017 06:33   Ct Angio Neck W Or Wo Contrast  Result Date: 07/23/2017 CLINICAL DATA:  Acute presentation with slurred speech and bilateral arm numbness upon wakening at 0530 hours. Speech disturbance. 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 Question hyperdense left MCA branch. EXAM: CT ANGIOGRAPHY HEAD AND NECK CT PERFUSION BRAIN TECHNIQUE: Multidetector CT imaging of the head and neck was performed using the standard protocol during bolus administration of  intravenous contrast. Multiplanar CT image reconstructions and MIPs were obtained to evaluate the vascular anatomy. Carotid stenosis measurements (when applicable) are obtained utilizing NASCET criteria, using the distal internal carotid diameter as the denominator. Multiphase CT imaging of the brain was performed following IV bolus contrast injection. Subsequent parametric perfusion maps were calculated using RAPID software. CONTRAST:  130m ISOVUE-370 IOPAMIDOL (ISOVUE-370) INJECTION 76% COMPARISON:  CT earlier same day.  MRI 11/11/2014. FINDINGS: CTA NECK FINDINGS Aortic arch: Minimal aortic atherosclerosis. No aneurysm or dissection. Branching pattern of the brachiocephalic vessels from the arch is normal with mild atherosclerotic change but no stenosis. Right carotid system: Common carotid artery shows some plaque along its course to the bifurcation but no stenosis or irregularity. Right ICA bifurcation shows soft and calcified plaque of the ICA bulb but without stenosis greater than diameter of the more distal cervical ICA. Left carotid system: Common carotid artery shows some plaque along its course to the bifurcation but no stenosis or irregularity. At the carotid bifurcation, there is atherosclerotic plaque both soft and calcified. Minimal diameter of the distal bulb is 3.7 mm. Compared to an expected distal cervical ICA diameter of 4 mm, this indicates a 10-20% stenosis. There is focus soft plaque affecting the cervical ICA at the C1 level which narrows the luminal diameter to 2.4 mm, consistent with a 40% stenosis. I do not see obvious ulceration. Vertebral arteries: Left vertebral artery origin is low but widely patent. There is calcified plaque at the right vertebral artery origin with stenosis estimated at 30-50%. Both vertebral arteries are small but otherwise patent through the cervical region to the foramen magnum. Skeleton: Mid cervical spondylosis. Other neck: No mass or lymphadenopathy. Possible  posterior nasal polyp. Upper chest:  Advanced emphysema. Review of the MIP images confirms the above findings CTA HEAD FINDINGS Anterior circulation: Both internal carotid arteries are patent through the skull base and siphon regions. There is advanced atherosclerotic disease in the carotid siphon regions with narrowing and irregularity. This is particularly severe on the left where the lumen appears barely patent. Additionally, there is a patent trigeminal artery on the left which is self shows atherosclerotic change in stenosis, giving the majority supply to the basilar tip. Right supraclinoid ICA regains a normal caliber. There is fetal origin of the right PCA. Right anterior and middle cerebral vessels are widely patent presently. On the left, supraclinoid ICA shows 50-70% stenosis. There is flow in the left anterior and middle cerebral vessels. M3 branch vessel occlusion in the region of the left posterior frontal brain. Posterior circulation: Both vertebral arteries are patent through the foramen magnum, supplying both picas. There is a tiny basilar artery which is patent to the level where the patent trigeminal artery supplies the distal basilar. Superior cerebellar and posterior cerebral vessels are patent, with fetal origin of the right PCA as noted above. Venous sinuses: Patent and normal. Anatomic variants: See above for discussion of left trigeminal artery. Delayed phase: No abnormal enhancement. 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 Given this, the patient would be a candidate for consideration of embolectomy. M2-3 branch inclusion,  though difficult to identify as pre CT a perfusion imaging causes vascular opacification. Nonstenotic atherosclerotic change at both carotid bifurcations. Focal soft plaque of the upper cervical ICA on the left at the C1 level with stenosis of 40% but no visible ulceration. Very severe atherosclerotic change of both carotid siphon regions with severe stenosis and irregularity. This could serve as a challenge for embolectomy. 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 These results were called by telephone at the time of interpretation on 07/23/2017 at 7:20 am to Dr. ALCharlesetta Ivory who verbally acknowledged these results. Electronically Signed   By: MaNelson Chimes.D.   On: 07/23/2017 07:42   Mr Brain Wo Contrast  Result Date: 07/24/2017 CLINICAL DATA:  Initial evaluation for acute stroke. EXAM: MRI HEAD WITHOUT CONTRAST TECHNIQUE: Multiplanar, multiecho pulse sequences of the brain and surrounding structures were obtained without intravenous contrast. COMPARISON:  Comparison made with prior studies from 07/23/2017. FINDINGS: Brain: Mild diffuse prominence of the CSF containing spaces compatible with generalized age-related cerebral atrophy. No significant cerebral white matter changes for age. Scattered areas of encephalomalacia with gliosis present within the right parieto-occipital and left occipital regions consistent with remote ischemic infarcts. Minimal chronic susceptibility artifact seen about these infarcts at the right parietal region. Additional small remote lacunar infarct noted at the left caudate head. Patchy small volume acute ischemic left MCA territory infarcts seen involving the cortical gray matter and underlying subcortical white matter of the left mid and posterior frontal lobe. Patchy involvement of the mid/posterior left insula, with a few foci of diffusion abnormality within the  left basal ganglia. Largest area of infarct present at the left insula and measures approximately 12  mm. No associated hemorrhage or mass effect. Additional few subcentimeter foci of diffusion abnormality involves the cortical gray matter of the right posterior frontal parietal region, immediately anterior to the chronic right parietal infarct (series 5001, image 72). Findings also suspicious for possible additional tiny infarcts, although artifact could conceivably be considered given the adjacent chronic infarction. No associated hemorrhage or mass effect. No other evidence for acute or subacute ischemia. Gray-white matter differentiation otherwise maintained. No evidence for acute intracranial hemorrhage. No mass lesion, midline shift or mass effect. No hydrocephalus. No extra-axial fluid collection. Major dural sinuses are grossly patent. Pituitary gland suprasellar region normal. Midline structures intact and normal. Vascular: Major intracranial vascular flow voids are maintained at the skull base. Focus of susceptibility artifact at the base of the left sylvian fissure likely reflects thrombus in previously identified left M2 occlusion (series 12001, image 24). Skull and upper cervical spine: Craniocervical junction within normal limits. Upper cervical spine normal. Bone marrow signal intensity within normal limits. No scalp soft tissue abnormality. Sinuses/Orbits: Globes orbital soft tissues within normal limits. Minimal layering opacity noted within the left maxillary sinus. Paranasal sinuses are otherwise largely clear. No mastoid effusion. Inner ear structures normal. Other: None. IMPRESSION: 1. Patchy small volume acute ischemic cortical and subcortical left MCA territory infarcts as above. No associated hemorrhage or mass effect. 2. Additional few small subcentimeter foci of diffusion abnormality involving the cortical gray matter of the right frontoparietal region, also suspicious for tiny acute  ischemic nonhemorrhagic infarcts. 3. Remote/chronic bilateral parieto-occipital infarcts, right greater than left. Electronically Signed   By: Jeannine Boga M.D.   On: 07/24/2017 01:06   Ct Cerebral Perfusion W Contrast  Result Date: 07/23/2017 CLINICAL DATA:  Acute presentation with slurred speech and bilateral arm numbness upon wakening at 0530 hours. Speech disturbance. 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 Question hyperdense left MCA branch. EXAM: CT ANGIOGRAPHY HEAD AND NECK CT PERFUSION BRAIN TECHNIQUE: Multidetector CT imaging of the head and neck was performed using the standard protocol during bolus administration of intravenous contrast. Multiplanar CT image reconstructions and MIPs were obtained to evaluate the vascular anatomy. Carotid stenosis measurements (when applicable) are obtained utilizing NASCET criteria, using the distal internal carotid diameter as the denominator. Multiphase CT imaging of the brain was performed following IV bolus contrast injection. Subsequent parametric perfusion maps were calculated using RAPID software. CONTRAST:  120m ISOVUE-370 IOPAMIDOL (ISOVUE-370) INJECTION 76% COMPARISON:  CT earlier same day.  MRI 11/11/2014. FINDINGS: CTA NECK FINDINGS Aortic arch: Minimal aortic atherosclerosis. No aneurysm or dissection. Branching pattern of the brachiocephalic vessels from the arch is normal with mild atherosclerotic change but no stenosis. Right carotid system: Common carotid artery shows some plaque along its course to the bifurcation but no stenosis or irregularity. Right ICA bifurcation shows soft and calcified plaque of the ICA bulb but without stenosis greater than diameter of the more distal cervical ICA. Left carotid system: Common carotid artery shows some plaque along its course to the bifurcation but no stenosis or irregularity. At the carotid bifurcation, there is atherosclerotic plaque both soft and calcified. Minimal diameter of the  distal bulb is 3.7 mm. Compared to an expected distal cervical ICA diameter of 4 mm, this indicates a 10-20% stenosis. There is focus soft plaque affecting the cervical ICA at the C1 level which narrows the luminal diameter to 2.4 mm, consistent with a 40% stenosis. I do not see obvious ulceration. Vertebral arteries: Left vertebral artery origin is low but widely patent.  There is calcified plaque at the right vertebral artery origin with stenosis estimated at 30-50%. Both vertebral arteries are small but otherwise patent through the cervical region to the foramen magnum. Skeleton: Mid cervical spondylosis. Other neck: No mass or lymphadenopathy. Possible posterior nasal polyp. Upper chest: Advanced emphysema. Review of the MIP images confirms the above findings CTA HEAD FINDINGS Anterior circulation: Both internal carotid arteries are patent through the skull base and siphon regions. There is advanced atherosclerotic disease in the carotid siphon regions with narrowing and irregularity. This is particularly severe on the left where the lumen appears barely patent. Additionally, there is a patent trigeminal artery on the left which is self shows atherosclerotic change in stenosis, giving the majority supply to the basilar tip. Right supraclinoid ICA regains a normal caliber. There is fetal origin of the right PCA. Right anterior and middle cerebral vessels are widely patent presently. On the left, supraclinoid ICA shows 50-70% stenosis. There is flow in the left anterior and middle cerebral vessels. M3 branch vessel occlusion in the region of the left posterior frontal brain. Posterior circulation: Both vertebral arteries are patent through the foramen magnum, supplying both picas. There is a tiny basilar artery which is patent to the level where the patent trigeminal artery supplies the distal basilar. Superior cerebellar and posterior cerebral vessels are patent, with fetal origin of the right PCA as noted above.  Venous sinuses: Patent and normal. Anatomic variants: See above for discussion of left trigeminal artery. Delayed phase: No abnormal enhancement. Review of the MIP images confirms the above findings CT Brain Perfusion Findings: CBF (<30%) Volume: 57m Perfusion (Tmax>6.0s) volume: 40 formL Mismatch Volume: 230mInfarction Location:Left posterior frontal brain IMPRESSION: Acute infarction in the left posterior frontal brain with region of CBF less than 30% measured at 21 cc and T-max greater than 6 seconds at 44 cc for a 23 cc region of at risk brain. Given this, the patient would be a candidate for consideration of embolectomy. M2-3 branch inclusion, though difficult to identify as pre CT a perfusion imaging causes vascular opacification. Nonstenotic atherosclerotic change at both carotid bifurcations. Focal soft plaque of the upper cervical ICA on the left at the C1 level with stenosis of 40% but no visible ulceration. Very severe atherosclerotic change of both carotid siphon regions with severe stenosis and irregularity. This could serve as a challenge for embolectomy. 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 These results were called by telephone at the time of interpretation on 07/23/2017 at 7:20 am to Dr. ALCharlesetta Ivory who verbally acknowledged these results. Electronically Signed   By: MaNelson Chimes.D.   On: 07/23/2017 07:42   Dg Chest Port 1 View  Result Date: 07/23/2017 CLINICAL DATA:  Stroke. EXAM: PORTABLE CHEST 1 VIEW COMPARISON:  It CT 07/23/2017. FINDINGS: Mediastinum hilar structures normal. Lungs are clear. No pleural effusion or pneumothorax. Heart size normal. No acute bony abnormality. IMPRESSION: No acute cardiopulmonary disease. Electronically Signed   By: ThMarcello MooresRegister   On: 07/23/2017 16:40   Dg Swallowing Func-speech Pathology  Result Date: 07/24/2017 Objective Swallowing  Evaluation: Type of Study: Bedside Swallow Evaluation  Patient Details Name: Henry HobackRN: [id number] of Birth: 1201/04/1967oday's Date: 07/24/2017 Time: SLP Start Time (ACUTE ONLY): 1141 -SLP Stop Time (ACUTE ONLY): 1203 SLP Time Calculation (min) (ACUTE ONLY): 22 min Past Medical History: Past Medical History: Diagnosis Date . ETOH abuse  .  Hyperlipidemia  . Hypertension  . Stroke [facility])  Past Surgical History: Past Surgical History: Procedure Laterality Date . TEE WITHOUT CARDIOVERSION N/A 11/15/2014  Procedure: TRANSESOPHAGEAL ECHOCARDIOGRAM (TEE);  Surgeon: Minna Merritts, MD;  Location: [facility] ORS;  Service: Cardiovascular;  Laterality: N/A; HPI: Henry Boone is a left handed 52 y.o. male who presented to [facility] after waking up with slurred speech, right facial droop, and right sided weakness. PMHx includes CVA, hypertension, hyperlipidemia, and ETOH abuse. CT head with CTA head neck, the latter revealing a left M2 occlusion. CTP revealed a 20 cc core in the left frontal lobe with penumbra of 23 cc. Pt currenlty NPO.  Subjective: Pt is alert and cooperative to assessment Assessment / Plan / Recommendation CHL IP CLINICAL IMPRESSIONS 07/24/2017 Clinical Impression Pt presents with a mild oropharyngeal dysphagia characterized by reduced bolus control and premature spillage leading to inconsistent penetration with thin liquids. Pt had more consistent penetration with straw presentation. Pt was inconsistently sensate to penetration, with the majority of airway compromise not eliciting response, which may be due to shallow nature. Pt benefited from [place] verbal cues to cough to expel penetrates from laryngeal vestibule. With use of chin tuck, pt was able to contain spillage in the vallecula and prevent airway compromise. Pt's pharyngeal phase was overtly functional for all other consistencies tested. Recommend thin liquids and regular diet with use of chin tuck, aspiration precautions, no straws, and intermittent  supervision. Medications administered whole in puree. SLP will follow-up to ensure compliance with compensatory strategies and aspiration precautions and safety with updated diet recommendations. Continue to recommend OP SLP upon D/C.   SLP Visit Diagnosis Dysphagia, oropharyngeal phase (R13.12) Attention and concentration deficit following -- Frontal lobe and executive function deficit following -- Impact on safety and function Moderate aspiration risk   CHL IP TREATMENT RECOMMENDATION 07/24/2017 Treatment Recommendations Therapy as outlined in treatment plan below   Prognosis 07/24/2017 Prognosis for Safe Diet Advancement Good Barriers to Reach Goals -- Barriers/Prognosis Comment -- CHL IP DIET RECOMMENDATION 07/24/2017 SLP Diet Recommendations Thin liquid;Regular solids Liquid Administration via Cup;No straw Medication Administration Whole meds with puree Compensations Slow rate;Small sips/bites;Chin tuck Postural Changes Seated upright at 90 degrees   CHL IP OTHER RECOMMENDATIONS 07/24/2017 Recommended Consults -- Oral Care Recommendations Oral care BID Other Recommendations --   CHL IP FOLLOW UP RECOMMENDATIONS 07/24/2017 Follow up Recommendations Outpatient SLP   CHL IP FREQUENCY AND DURATION 07/24/2017 Speech Therapy Frequency (ACUTE ONLY) min 2x/week Treatment Duration 2 weeks      CHL IP ORAL PHASE 07/24/2017 Oral Phase Impaired Oral - Pudding Teaspoon -- Oral - Pudding Cup -- Oral - Honey Teaspoon -- Oral - Honey Cup -- Oral - Nectar Teaspoon -- Oral - Nectar Cup WFL Oral - Nectar Straw -- Oral - Thin Teaspoon -- Oral - Thin Cup Premature spillage;Decreased bolus cohesion Oral - Thin Straw Premature spillage;Decreased bolus cohesion Oral - Puree WFL Oral - Mech Soft -- Oral - Regular WFL Oral - Multi-Consistency -- Oral - Pill -- Oral Phase - Comment --  CHL IP PHARYNGEAL PHASE 07/24/2017 Pharyngeal Phase Impaired Pharyngeal- Pudding Teaspoon -- Pharyngeal -- Pharyngeal- Pudding Cup -- Pharyngeal -- Pharyngeal-  Honey Teaspoon -- Pharyngeal -- Pharyngeal- Honey Cup -- Pharyngeal -- Pharyngeal- Nectar Teaspoon -- Pharyngeal -- Pharyngeal- Nectar Cup WFL Pharyngeal -- Pharyngeal- Nectar Straw -- Pharyngeal -- Pharyngeal- Thin Teaspoon -- Pharyngeal -- Pharyngeal- Thin Cup Penetration/Aspiration before swallow;Compensatory strategies attempted (with notebox) Pharyngeal Material enters airway, remains ABOVE vocal cords and not  ejected out Pharyngeal- Thin Straw Penetration/Aspiration before swallow Pharyngeal Material enters airway, remains ABOVE vocal cords and not ejected out Pharyngeal- Puree WFL Pharyngeal -- Pharyngeal- Mechanical Soft -- Pharyngeal -- Pharyngeal- Regular WFL Pharyngeal -- Pharyngeal- Multi-consistency -- Pharyngeal -- Pharyngeal- Pill -- Pharyngeal -- Pharyngeal Comment --  CHL IP CERVICAL ESOPHAGEAL PHASE 07/24/2017 Cervical Esophageal Phase WFL Pudding Teaspoon -- Pudding Cup -- Honey Teaspoon -- Honey Cup -- Nectar Teaspoon -- Nectar Cup -- Nectar Straw -- Thin Teaspoon -- Thin Cup -- Thin Straw -- Puree -- Mechanical Soft -- Regular -- Multi-consistency -- Pill -- Cervical Esophageal Comment -- No flowsheet data found. Germain Osgood 07/24/2017, 3:57 PM  Note populated for [place] Jarrett, Student SLP Germain Osgood, M.A. CCC-SLP [phone]             Ir Angio Extracran Sel Com Carotid Innominate Uni Bilat Mod Sed  Result Date: 07/24/2017 CLINICAL DATA:  Severe dysarthria. Right-sided facial weakness. Abnormal CT angiogram of the head and neck. EXAM: IR ANGIO EXTRACRAN SELECT COM CAROTID INNOMINATE BILAT MOD SED; IR ANGIO VERTEBRAL SEL VERTEBRAL BILAT MOD SED COMPARISON:  CT angiogram of the head and neck of 07/23/2017. MEDICATIONS: Heparin 1000 units IV; no antibiotic was administered within 1 hour of the procedure. ANESTHESIA/SEDATION: Mac anesthesia per the Department of Anesthesiology at [place] 1 mg IV; Fentanyl 25 mcg IV Moderate Sedation Time:  As per mac  anesthesia. Marland Kitchen CONTRAST:  Isovue 300 approximately 65 cc. FLUOROSCOPY TIME:  Fluoroscopy Time: 7 minutes 20 seconds (883 mGy). COMPLICATIONS: None immediate. TECHNIQUE: Informed written consent was obtained from the patient after a thorough discussion of the procedural risks, benefits and alternatives. All questions were addressed. Maximal Sterile Barrier Technique was utilized including caps, mask, sterile gowns, sterile gloves, sterile drape, hand hygiene and skin antiseptic. A timeout was performed prior to the initiation of the procedure. The right groin was prepped and draped in the usual sterile fashion. Thereafter using modified Seldinger technique, transfemoral access into the right common femoral artery was obtained without difficulty. Over a 0.035 inch guidewire, a 5 French Pinnacle sheath was inserted. Through this, and also over 0.035 inch guidewire, a 5 [place] JB 1 catheter was advanced to the aortic arch region and selectively positioned in the right common carotid artery, the right vertebral artery, the left common carotid artery and the left vertebral artery. FINDINGS: The right common carotid arteriogram demonstrates the right external carotid artery to be mildly narrowed at its origin. Its branches are normally opacified. The right internal carotid artery at the bulb to the cranial skull base opacifies normally. The petrous segment is widely patent. There is a tapered severe stenosis of the proximal cavernous segment of 80-85% associated with mild poststenotic dilatation. Distal to this the distal cavernous and supraclinoid segments are widely patent. The right middle cerebral artery and the right anterior cerebral artery opacify into the capillary and venous phases. There is prompt filling via the anterior communicating artery of the left anterior cerebral artery A1 A2 segments, and also of the left MCA distribution opacifying faintly the inferior and the superior divisions. Inflow of unopacified  blood is seen in the proximal middle cerebral artery to be described below. Also demonstrated is a dominant right posterior communicating artery opacifying the right posterior cerebral artery distribution. The origin of the right vertebral artery is normal. The vessel is seen to opacify to the cranial skull base. Wide patency is seen of the right posterior-inferior cerebellar artery and the right vertebrobasilar junction.  A tapered hypoplastic right vertebrobasilar junction is seen distal to the right posterior-inferior cerebellar artery. The opacified portion of the basilar artery, the superior cerebellar arteries and the anterior-inferior cerebellar arteries is noted into the capillary and venous phases. Nonvisualization of the posterior cerebral arteries is probably related inflow from the anterior circulation and also from the contralateral left vertebral artery. The left common carotid arteriogram demonstrates the left external carotid artery and its major branches to be widely patent. The left internal carotid artery at the bulb to the cranial skull base demonstrates wide patency. There is a mild smooth atherosclerotic plaque along the posterior wall of the bulb without acute ulcerations or significant stenosis. More distally, the vessel is seen to opacify normally to the cranial skull base. Wide patency is seen of the petrous segment. There is complete angiographic occlusion distal to a prominent vessel arising from the superomedial aspect of the left internal carotid artery which opacifies the distal basilar artery including the left posterior cerebral artery distribution, and partially the right posterior cerebral artery distribution and the superior cerebellar arteries. 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 There is delayed filling of the distal cavernous segment of the right internal carotid artery, and also the  supraclinoid segments from a significantly prominent ophthalmic artery from collaterals of the external carotid artery in the orbital ethmoidal region. This results in the opacification of the right middle cerebral artery and the right anterior cerebral artery. Unopacified blood is seen within the middle cerebral artery from the contralateral right internal carotid artery via the anterior communicating artery as described earlier. The delayed capillary phase demonstrates suggestion of an occlusion of a perisylvian M3 branch of the superior division. The delayed capillary phase again demonstrates a moderate-sized area of hypoperfusion involving the posterior frontal and the anterior parietal cortical subcortical areas. The hypoplastic left vertebral artery origin is from the medial posterior aspect of the left subclavian artery. The vessel is seen to opacify to the cranial skull base. Opacification of the left vertebrobasilar junction and the left posterior-inferior cerebellar artery is noted. Distal flow is seen into the left vertebrobasilar junction beyond the origin of the left posterior-inferior cerebellar artery and into the basilar artery where it mixes with the unopacified blood from the contralateral vertebral artery. 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 Suggestion of occlusion of a M3 perisylvian branch of the superior division of the left middle cerebral artery. 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 PLAN: Follow-up in  approximately 2 weeks post discharge to discuss the management considerations of the 80-85% stenosis of the right internal carotid artery cavernous segment. Electronically Signed   By: Luanne Bras M.D.   On: 07/23/2017 16:49   Ir Angio Vertebral Sel Vertebral Bilat Mod Sed  Result Date: 07/24/2017 CLINICAL DATA:  Severe dysarthria. Right-sided facial weakness. Abnormal CT angiogram of the head and neck. EXAM: IR ANGIO EXTRACRAN SELECT COM CAROTID INNOMINATE BILAT MOD SED; IR ANGIO VERTEBRAL SEL VERTEBRAL BILAT MOD SED COMPARISON:  CT angiogram of the head and neck of 07/23/2017. MEDICATIONS: Heparin 1000 units IV;  no antibiotic was administered within 1 hour of the procedure. ANESTHESIA/SEDATION: Mac anesthesia per the Department of Anesthesiology at [place] 1 mg IV; Fentanyl 25 mcg IV Moderate Sedation Time:  As per mac anesthesia. Marland Kitchen CONTRAST:  Isovue 300 approximately 65 cc. FLUOROSCOPY TIME:  Fluoroscopy Time: 7 minutes 20 seconds (883 mGy). COMPLICATIONS: None immediate. TECHNIQUE: Informed written consent was obtained from the patient after a thorough discussion of the procedural risks, benefits and alternatives. All questions were addressed. Maximal Sterile Barrier Technique was utilized including caps, mask, sterile gowns, sterile gloves, sterile drape, hand hygiene and skin antiseptic. A timeout was performed prior to the initiation of the procedure. The right groin was prepped and draped in the usual sterile fashion. Thereafter using modified Seldinger technique, transfemoral access into the right common femoral artery was obtained without difficulty. Over a 0.035 inch guidewire, a 5 French Pinnacle sheath was inserted. Through this, and also over 0.035 inch guidewire, a 5 [place] JB 1 catheter was advanced to the aortic arch region and selectively positioned in the right common carotid artery, the right vertebral artery, the left common carotid artery and the left vertebral  artery. FINDINGS: The right common carotid arteriogram demonstrates the right external carotid artery to be mildly narrowed at its origin. Its branches are normally opacified. The right internal carotid artery at the bulb to the cranial skull base opacifies normally. The petrous segment is widely patent. There is a tapered severe stenosis of the proximal cavernous segment of 80-85% associated with mild poststenotic dilatation. Distal to this the distal cavernous and supraclinoid segments are widely patent. The right middle cerebral artery and the right anterior cerebral artery opacify into the capillary and venous phases. There is prompt filling via the anterior communicating artery of the left anterior cerebral artery A1 A2 segments, and also of the left MCA distribution opacifying faintly the inferior and the superior divisions. Inflow of unopacified blood is seen in the proximal middle cerebral artery to be described below. Also demonstrated is a dominant right posterior communicating artery opacifying the right posterior cerebral artery distribution. The origin of the right vertebral artery is normal. The vessel is seen to opacify to the cranial skull base. Wide patency is seen of the right posterior-inferior cerebellar artery and the right vertebrobasilar junction. A tapered hypoplastic right vertebrobasilar junction is seen distal to the right posterior-inferior cerebellar artery. The opacified portion of the basilar artery, the superior cerebellar arteries and the anterior-inferior cerebellar arteries is noted into the capillary and venous phases. Nonvisualization of the posterior cerebral arteries is probably related inflow from the anterior circulation and also from the contralateral left vertebral artery. The left common carotid arteriogram demonstrates the left external carotid artery and its major branches to be widely patent. The left internal carotid artery at the bulb to the cranial skull base  demonstrates wide patency. There is a mild smooth atherosclerotic plaque along the posterior wall of the bulb without acute ulcerations or significant stenosis. More distally, the vessel is seen to opacify normally to the cranial skull base. Wide patency is seen of the petrous segment. There is complete angiographic occlusion distal to a prominent vessel arising from the superomedial aspect of the left internal carotid artery which opacifies the distal basilar artery including the left posterior cerebral artery distribution, and partially the right posterior cerebral artery distribution and the superior cerebellar arteries. 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 There  is delayed filling of the distal cavernous segment of the right internal carotid artery, and also the supraclinoid segments from a significantly prominent ophthalmic artery from collaterals of the external carotid artery in the orbital ethmoidal region. This results in the opacification of the right middle cerebral artery and the right anterior cerebral artery. Unopacified blood is seen within the middle cerebral artery from the contralateral right internal carotid artery via the anterior communicating artery as described earlier. The delayed capillary phase demonstrates suggestion of an occlusion of a perisylvian M3 branch of the superior division. The delayed capillary phase again demonstrates a moderate-sized area of hypoperfusion involving the posterior frontal and the anterior parietal cortical subcortical areas. The hypoplastic left vertebral artery origin is from the medial posterior aspect of the left subclavian artery. The vessel is seen to opacify to the cranial skull base. Opacification of the left vertebrobasilar junction and the left posterior-inferior cerebellar artery is noted. Distal flow is seen into the left vertebrobasilar junction beyond the  origin of the left posterior-inferior cerebellar artery and into the basilar artery where it mixes with the unopacified blood from the contralateral vertebral artery. 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 Suggestion of occlusion of a M3 perisylvian branch of the superior division of the left middle cerebral artery. 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 PLAN: Follow-up in approximately 2 weeks post discharge to discuss the management considerations of the 80-85% stenosis of the right internal carotid artery cavernous segment. Electronically Signed   By: Luanne Bras M.D.   On: 07/23/2017 16:49    Labs:  CBC: Recent Labs    07/23/17 0625 07/23/17 1007 07/25/17 0906  WBC 11.4* 12.7* 8.6  HGB 15.5 15.0 15.0  HCT 47.1 44.7 44.5  PLT 307 267 244    COAGS: Recent Labs    07/23/17 0625  INR 0.93  APTT 30    BMP: Recent Labs    07/23/17 0625 07/23/17 1007 07/25/17 0906  NA 136  --  137  K 3.9  --  4.2  CL 105  --  103  CO2 25  --  24  GLUCOSE 135*  --  178*  BUN 6  --  5*  CALCIUM 8.9  --  9.2  CREATININE 0.76 0.71 0.79  GFRNONAA >60 >60 >60  GFRAA >60 >60 >60    LIVER FUNCTION TESTS: Recent Labs    07/23/17 0625  BILITOT 0.7  AST 21  ALT 10*  ALKPHOS 44  PROT 6.7  ALBUMIN 4.1    Assessment and Plan:  CVA 07/23/2017. Plan for discharge home today per Neruology. Speech continues to improve, continue to work with speech therapy. Right groin intact. Appreciate and agree with Neurology  management. Instructed patient to continue taking Plavix 75 mg once daily and Aspirin 325 mg once daily. Instructed patient to follow-up with Dr. Estanislado Pandy 2 weeks after discharge.  Electronically Signed: Earley Abide, PA-C 07/25/2017, 10:57 AM   I spent a total of 20 minutes at the the patient's bedside AND on the patient's hospital floor or unit, greater than 50% of which was counseling/coordinating care for CVA.

## 2017-07-26 ENCOUNTER — Other Ambulatory Visit: Payer: Self-pay | Admitting: Cardiology

## 2017-07-26 ENCOUNTER — Other Ambulatory Visit (HOSPITAL_COMMUNITY): Payer: Self-pay | Admitting: Interventional Radiology

## 2017-07-26 DIAGNOSIS — I639 Cerebral infarction, unspecified: Secondary | ICD-10-CM

## 2017-07-31 ENCOUNTER — Telehealth: Payer: Self-pay | Admitting: *Deleted

## 2017-07-31 NOTE — Telephone Encounter (Signed)
Lvm fee due for [place] group form.

## 2017-08-01 ENCOUNTER — Ambulatory Visit (HOSPITAL_COMMUNITY)
Admission: RE | Admit: 2017-08-01 | Discharge: 2017-08-01 | Disposition: A | Discharge status: Home / Self Care | Payer: Managed Care, Other (non HMO) | Source: Ambulatory Visit | Attending: Interventional Radiology | Admitting: Interventional Radiology

## 2017-08-01 DIAGNOSIS — I639 Cerebral infarction, unspecified: Secondary | ICD-10-CM

## 2017-08-01 NOTE — Consult Note (Signed)
Chief Complaint: Patient was seen in consultation today for right ICA stenosis.  Supervising Physician: Luanne Bras  Patient Status: [facility] - Out-pt  History of Present Illness: Henry Boone is a 52 y.o. male  Hx CVA 2019, hypertension, and hyperlipidemia.  Presented to ED 07/23/2017 with aphasia and right-sided weakness, diagnosed with CVA.  Cerebral angiogram 07/23/2017 with Dr. Estanislado Pandy: 1. 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 2. Suggestion of occlusion of a M3 perisylvian branch of the superior division of the left middle cerebral artery. 3. 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 4. 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  Patient presents today for consultation regarding management of his right ICA stenosis. States speech has improved since ED admission 07/23/2017, he is scheduled with speech therapy soon. States right hand numbness/tingling has improved since ED admission 07/23/2017, he is scheduled with PT/OT soon. Denies headache, weakness, dizziness, syncope, vision changes, hearing changes, or tinnitus.  Patient states that he quit smoking.  Patient is taking Plavix 75 mg once daily and Aspirin 81 mg once daily.  Past Medical History:  Diagnosis Date  . ETOH abuse   . Hyperlipidemia   . Hypertension   . Stroke [facility])     Past Surgical History:  Procedure Laterality Date  . IR ANGIO EXTRACRAN SEL COM CAROTID INNOMINATE UNI BILAT MOD SED  07/23/2017  . IR ANGIO VERTEBRAL SEL VERTEBRAL BILAT MOD SED  07/23/2017  . TEE WITHOUT CARDIOVERSION N/A  11/15/2014   Procedure: TRANSESOPHAGEAL ECHOCARDIOGRAM (TEE);  Surgeon: Minna Merritts, MD;  Location: [facility] ORS;  Service: Cardiovascular;  Laterality: N/A;    Allergies: Patient has no known allergies.  Medications: Prior to Admission medications   Medication Sig Start Date End Date Taking? Authorizing Provider  aspirin EC 325 MG EC tablet Take 1 tablet (325 mg total) by mouth daily. 11/15/14   Aldean Jewett, MD  lisinopril (PRINIVIL,ZESTRIL) 10 MG tablet Take 10 mg by mouth daily. 06/30/17   [provider]  Multiple Vitamin (MULTIVITAMIN WITH MINERALS) TABS tablet Take 1 tablet by mouth daily. 11/15/14   Aldean Jewett, MD  pravastatin (PRAVACHOL) 40 MG tablet Take 1 tablet (40 mg total) by mouth daily. 11/15/14   Aldean Jewett, MD     No family history on file.  Social History   Socioeconomic History  . Marital status: Single    Spouse name: Not on file  . Number of children: Not on file  . Years of education: Not on file  . Highest education level: Not on file  Occupational History  . Not on file  Social Needs  . Financial resource strain: Not on file  . Food insecurity:    Worry: Not on file    Inability: Not on file  . Transportation needs:    Medical: Not on file    Non-medical: Not on file  Tobacco Use  . Smoking status: Current Every Day Smoker    Packs/day: 1.00    Types: Cigarettes  . Smokeless tobacco: Never Used  Substance and Sexual Activity  . Alcohol use: Yes  . Drug use: Not on file  . Sexual activity: Yes  Lifestyle  .  Physical activity:    Days per week: Not on file    Minutes per session: Not on file  . Stress: Not on file  Relationships  . Social connections:    Talks on phone: Not on file    Gets together: Not on file    Attends religious service: Not on file    Active member of club or organization: Not on file    Attends meetings of clubs or organizations: Not on file    Relationship status: Not on file  Other Topics  Concern  . Not on file  Social History Narrative  . Not on file     Review of Systems: A 12 point ROS discussed and pertinent positives are indicated in the HPI above.  All other systems are negative.  Review of Systems  Constitutional: Negative for activity change and fever.  HENT: Negative for hearing loss and tinnitus.   Eyes: Negative for visual disturbance.  Respiratory: Negative for shortness of breath and wheezing.   Cardiovascular: Negative for chest pain and palpitations.  Neurological: Positive for speech difficulty and numbness. Negative for dizziness, syncope, weakness and headaches.  Psychiatric/Behavioral: Negative for behavioral problems and confusion.    Vital Signs: There were no vitals taken for this visit.  Physical Exam  Constitutional: He is oriented to person, place, and time. He appears well-developed and well-nourished. No distress.  Neurological: He is alert and oriented to person, place, and time.  Psychiatric: He has a normal mood and affect. His behavior is normal. Judgment and thought content normal.    Imaging: Ct Angio Head W Or Wo Contrast  Result Date: 07/23/2017 CLINICAL DATA:  Acute presentation with slurred speech and bilateral arm numbness upon wakening at 0530 hours. Speech disturbance. 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 Question hyperdense left MCA branch. EXAM: CT ANGIOGRAPHY HEAD AND NECK CT PERFUSION BRAIN TECHNIQUE: Multidetector CT imaging of the head and neck was performed using the standard protocol during bolus administration of intravenous contrast. Multiplanar CT image reconstructions and MIPs were obtained to evaluate the vascular anatomy. Carotid stenosis measurements (when applicable) are obtained utilizing NASCET criteria, using the distal internal carotid diameter as the denominator. Multiphase CT imaging of the brain was performed following IV bolus contrast injection. Subsequent parametric perfusion maps were  calculated using RAPID software. CONTRAST:  142m ISOVUE-370 IOPAMIDOL (ISOVUE-370) INJECTION 76% COMPARISON:  CT earlier same day.  MRI 11/11/2014. FINDINGS: CTA NECK FINDINGS Aortic arch: Minimal aortic atherosclerosis. No aneurysm or dissection. Branching pattern of the brachiocephalic vessels from the arch is normal with mild atherosclerotic change but no stenosis. Right carotid system: Common carotid artery shows some plaque along its course to the bifurcation but no stenosis or irregularity. Right ICA bifurcation shows soft and calcified plaque of the ICA bulb but without stenosis greater than diameter of the more distal cervical ICA. Left carotid system: Common carotid artery shows some plaque along its course to the bifurcation but no stenosis or irregularity. At the carotid bifurcation, there is atherosclerotic plaque both soft and calcified. Minimal diameter of the distal bulb is 3.7 mm. Compared to an expected distal cervical ICA diameter of 4 mm, this indicates a 10-20% stenosis. There is focus soft plaque affecting the cervical ICA at the C1 level which narrows the luminal diameter to 2.4 mm, consistent with a 40% stenosis. I do not see obvious ulceration. Vertebral arteries: Left vertebral artery origin is low but widely patent. There is calcified plaque at the right vertebral artery  origin with stenosis estimated at 30-50%. Both vertebral arteries are small but otherwise patent through the cervical region to the foramen magnum. Skeleton: Mid cervical spondylosis. Other neck: No mass or lymphadenopathy. Possible posterior nasal polyp. Upper chest: Advanced emphysema. Review of the MIP images confirms the above findings CTA HEAD FINDINGS Anterior circulation: Both internal carotid arteries are patent through the skull base and siphon regions. There is advanced atherosclerotic disease in the carotid siphon regions with narrowing and irregularity. This is particularly severe on the left where the lumen  appears barely patent. Additionally, there is a patent trigeminal artery on the left which is self shows atherosclerotic change in stenosis, giving the majority supply to the basilar tip. Right supraclinoid ICA regains a normal caliber. There is fetal origin of the right PCA. Right anterior and middle cerebral vessels are widely patent presently. On the left, supraclinoid ICA shows 50-70% stenosis. There is flow in the left anterior and middle cerebral vessels. M3 branch vessel occlusion in the region of the left posterior frontal brain. Posterior circulation: Both vertebral arteries are patent through the foramen magnum, supplying both picas. There is a tiny basilar artery which is patent to the level where the patent trigeminal artery supplies the distal basilar. Superior cerebellar and posterior cerebral vessels are patent, with fetal origin of the right PCA as noted above. Venous sinuses: Patent and normal. Anatomic variants: See above for discussion of left trigeminal artery. Delayed phase: No abnormal enhancement. Review of the MIP images confirms the above findings CT Brain Perfusion Findings: CBF (<30%) Volume: 15m Perfusion (Tmax>6.0s) volume: 40 formL Mismatch Volume: 227mInfarction Location:Left posterior frontal brain IMPRESSION: Acute infarction in the left posterior frontal brain with region of CBF less than 30% measured at 21 cc and T-max greater than 6 seconds at 44 cc for a 23 cc region of at risk brain. Given this, the patient would be a candidate for consideration of embolectomy. M2-3 branch inclusion, though difficult to identify as pre CT a perfusion imaging causes vascular opacification. Nonstenotic atherosclerotic change at both carotid bifurcations. Focal soft plaque of the upper cervical ICA on the left at the C1 level with stenosis of 40% but no visible ulceration. Very severe atherosclerotic change of both carotid siphon regions with severe stenosis and irregularity. This could serve as  a challenge for embolectomy. 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 These results were called by telephone at the time of interpretation on 07/23/2017 at 7:20 am to Dr. ALCharlesetta Ivory who verbally acknowledged these results. Electronically Signed   By: MaNelson Chimes.D.   On: 07/23/2017 07:42   Ct Head Wo Contrast  Result Date: 07/23/2017 CLINICAL DATA:  [age]/o  M; slurred speech and right facial droop. EXAM: CT HEAD WITHOUT CONTRAST TECHNIQUE: Contiguous axial images were obtained from the base of the skull through the vertex without intravenous contrast. COMPARISON:  11/11/2014 CT and MRI of the head. FINDINGS: Brain: Moderate right parietal and small left parietal chronic infarctions. No acute infarct, hemorrhage, or focal mass effect of the brain identified. No extra-axial collection, hydrocephalus, effacement of basilar cisterns, or herniation. Vascular: Calcific atherosclerosis of carotid siphons. Density within the proximal left M2 superior division (series 4, image 29). Skull: Normal. Negative for fracture or focal lesion. Sinuses/Orbits: Maxillary sinus mucosal thickening. Otherwise normally aerated paranasal sinuses and mastoid air cells. Orbits are unremarkable. Other: None. IMPRESSION: 1. No acute intracranial  abnormality identified.  ASPECTS is 10. 2. Left M2 superior division dense vessel may represent a thrombus. CTA of the head recommended. 3. Right larger than left parietal lobe chronic infarctions. These results were called by telephone at the time of interpretation on 07/23/2017 at 6:27 am to Dr. Dahlia Client, who verbally acknowledged these results. Electronically Signed   By: Kristine Garbe M.D.   On: 07/23/2017 06:33   Ct Angio Neck W Or Wo Contrast  Result Date: 07/23/2017 CLINICAL DATA:  Acute presentation with slurred speech and bilateral arm numbness upon wakening at  0530 hours. Speech disturbance. 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 Question hyperdense left MCA branch. EXAM: CT ANGIOGRAPHY HEAD AND NECK CT PERFUSION BRAIN TECHNIQUE: Multidetector CT imaging of the head and neck was performed using the standard protocol during bolus administration of intravenous contrast. Multiplanar CT image reconstructions and MIPs were obtained to evaluate the vascular anatomy. Carotid stenosis measurements (when applicable) are obtained utilizing NASCET criteria, using the distal internal carotid diameter as the denominator. Multiphase CT imaging of the brain was performed following IV bolus contrast injection. Subsequent parametric perfusion maps were calculated using RAPID software. CONTRAST:  140m ISOVUE-370 IOPAMIDOL (ISOVUE-370) INJECTION 76% COMPARISON:  CT earlier same day.  MRI 11/11/2014. FINDINGS: CTA NECK FINDINGS Aortic arch: Minimal aortic atherosclerosis. No aneurysm or dissection. Branching pattern of the brachiocephalic vessels from the arch is normal with mild atherosclerotic change but no stenosis. Right carotid system: Common carotid artery shows some plaque along its course to the bifurcation but no stenosis or irregularity. Right ICA bifurcation shows soft and calcified plaque of the ICA bulb but without stenosis greater than diameter of the more distal cervical ICA. Left carotid system: Common carotid artery shows some plaque along its course to the bifurcation but no stenosis or irregularity. At the carotid bifurcation, there is atherosclerotic plaque both soft and calcified. Minimal diameter of the distal bulb is 3.7 mm. Compared to an expected distal cervical ICA diameter of 4 mm, this indicates a 10-20% stenosis. There is focus soft plaque affecting the cervical ICA at the C1 level which narrows the luminal diameter to 2.4 mm, consistent with a 40% stenosis. I do not see obvious ulceration. Vertebral arteries: Left vertebral artery origin is low  but widely patent. There is calcified plaque at the right vertebral artery origin with stenosis estimated at 30-50%. Both vertebral arteries are small but otherwise patent through the cervical region to the foramen magnum. Skeleton: Mid cervical spondylosis. Other neck: No mass or lymphadenopathy. Possible posterior nasal polyp. Upper chest: Advanced emphysema. Review of the MIP images confirms the above findings CTA HEAD FINDINGS Anterior circulation: Both internal carotid arteries are patent through the skull base and siphon regions. There is advanced atherosclerotic disease in the carotid siphon regions with narrowing and irregularity. This is particularly severe on the left where the lumen appears barely patent. Additionally, there is a patent trigeminal artery on the left which is self shows atherosclerotic change in stenosis, giving the majority supply to the basilar tip. Right supraclinoid ICA regains a normal caliber. There is fetal origin of the right PCA. Right anterior and middle cerebral vessels are widely patent presently. On the left, supraclinoid ICA shows 50-70% stenosis. There is flow in the left anterior and middle cerebral vessels. M3 branch vessel occlusion in the region of the left posterior frontal brain. Posterior circulation: Both vertebral arteries are patent through the foramen magnum, supplying both picas. There is a tiny basilar artery which is  patent to the level where the patent trigeminal artery supplies the distal basilar. Superior cerebellar and posterior cerebral vessels are patent, with fetal origin of the right PCA as noted above. Venous sinuses: Patent and normal. Anatomic variants: See above for discussion of left trigeminal artery. Delayed phase: No abnormal enhancement. Review of the MIP images confirms the above findings CT Brain Perfusion Findings: CBF (<30%) Volume: 97m Perfusion (Tmax>6.0s) volume: 40 formL Mismatch Volume: 241mInfarction Location:Left posterior frontal  brain IMPRESSION: Acute infarction in the left posterior frontal brain with region of CBF less than 30% measured at 21 cc and T-max greater than 6 seconds at 44 cc for a 23 cc region of at risk brain. Given this, the patient would be a candidate for consideration of embolectomy. M2-3 branch inclusion, though difficult to identify as pre CT a perfusion imaging causes vascular opacification. Nonstenotic atherosclerotic change at both carotid bifurcations. Focal soft plaque of the upper cervical ICA on the left at the C1 level with stenosis of 40% but no visible ulceration. Very severe atherosclerotic change of both carotid siphon regions with severe stenosis and irregularity. This could serve as a challenge for embolectomy. 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 These results were called by telephone at the time of interpretation on 07/23/2017 at 7:20 am to Dr. ALCharlesetta Ivory who verbally acknowledged these results. Electronically Signed   By: MaNelson Chimes.D.   On: 07/23/2017 07:42   Mr Brain Wo Contrast  Result Date: 07/24/2017 CLINICAL DATA:  Initial evaluation for acute stroke. EXAM: MRI HEAD WITHOUT CONTRAST TECHNIQUE: Multiplanar, multiecho pulse sequences of the brain and surrounding structures were obtained without intravenous contrast. COMPARISON:  Comparison made with prior studies from 07/23/2017. FINDINGS: Brain: Mild diffuse prominence of the CSF containing spaces compatible with generalized age-related cerebral atrophy. No significant cerebral white matter changes for age. Scattered areas of encephalomalacia with gliosis present within the right parieto-occipital and left occipital regions consistent with remote ischemic infarcts. Minimal chronic susceptibility artifact seen about these infarcts at the right parietal region. Additional small remote lacunar infarct noted at the left  caudate head. Patchy small volume acute ischemic left MCA territory infarcts seen involving the cortical gray matter and underlying subcortical white matter of the left mid and posterior frontal lobe. Patchy involvement of the mid/posterior left insula, with a few foci of diffusion abnormality within the left basal ganglia. Largest area of infarct present at the left insula and measures approximately 12 mm. No associated hemorrhage or mass effect. Additional few subcentimeter foci of diffusion abnormality involves the cortical gray matter of the right posterior frontal parietal region, immediately anterior to the chronic right parietal infarct (series 5001, image 72). Findings also suspicious for possible additional tiny infarcts, although artifact could conceivably be considered given the adjacent chronic infarction. No associated hemorrhage or mass effect. No other evidence for acute or subacute ischemia. Gray-white matter differentiation otherwise maintained. No evidence for acute intracranial hemorrhage. No mass lesion, midline shift or mass effect. No hydrocephalus. No extra-axial fluid collection. Major dural sinuses are grossly patent. Pituitary gland suprasellar region normal. Midline structures intact and normal. Vascular: Major intracranial vascular flow voids are maintained at the skull base. Focus of susceptibility artifact at the base of the left sylvian fissure likely reflects thrombus in previously identified left M2 occlusion (series 12001, image 24). Skull and upper cervical spine: Craniocervical junction within normal limits. Upper cervical  spine normal. Bone marrow signal intensity within normal limits. No scalp soft tissue abnormality. Sinuses/Orbits: Globes orbital soft tissues within normal limits. Minimal layering opacity noted within the left maxillary sinus. Paranasal sinuses are otherwise largely clear. No mastoid effusion. Inner ear structures normal. Other: None. IMPRESSION: 1. Patchy  small volume acute ischemic cortical and subcortical left MCA territory infarcts as above. No associated hemorrhage or mass effect. 2. Additional few small subcentimeter foci of diffusion abnormality involving the cortical gray matter of the right frontoparietal region, also suspicious for tiny acute ischemic nonhemorrhagic infarcts. 3. Remote/chronic bilateral parieto-occipital infarcts, right greater than left. Electronically Signed   By: Jeannine Boga M.D.   On: 07/24/2017 01:06   Ct Cerebral Perfusion W Contrast  Result Date: 07/23/2017 CLINICAL DATA:  Acute presentation with slurred speech and bilateral arm numbness upon wakening at 0530 hours. Speech disturbance. 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 Question hyperdense left MCA branch. EXAM: CT ANGIOGRAPHY HEAD AND NECK CT PERFUSION BRAIN TECHNIQUE: Multidetector CT imaging of the head and neck was performed using the standard protocol during bolus administration of intravenous contrast. Multiplanar CT image reconstructions and MIPs were obtained to evaluate the vascular anatomy. Carotid stenosis measurements (when applicable) are obtained utilizing NASCET criteria, using the distal internal carotid diameter as the denominator. Multiphase CT imaging of the brain was performed following IV bolus contrast injection. Subsequent parametric perfusion maps were calculated using RAPID software. CONTRAST:  133m ISOVUE-370 IOPAMIDOL (ISOVUE-370) INJECTION 76% COMPARISON:  CT earlier same day.  MRI 11/11/2014. FINDINGS: CTA NECK FINDINGS Aortic arch: Minimal aortic atherosclerosis. No aneurysm or dissection. Branching pattern of the brachiocephalic vessels from the arch is normal with mild atherosclerotic change but no stenosis. Right carotid system: Common carotid artery shows some plaque along its course to the bifurcation but no stenosis or irregularity. Right ICA bifurcation shows soft and calcified plaque of the ICA bulb but without  stenosis greater than diameter of the more distal cervical ICA. Left carotid system: Common carotid artery shows some plaque along its course to the bifurcation but no stenosis or irregularity. At the carotid bifurcation, there is atherosclerotic plaque both soft and calcified. Minimal diameter of the distal bulb is 3.7 mm. Compared to an expected distal cervical ICA diameter of 4 mm, this indicates a 10-20% stenosis. There is focus soft plaque affecting the cervical ICA at the C1 level which narrows the luminal diameter to 2.4 mm, consistent with a 40% stenosis. I do not see obvious ulceration. Vertebral arteries: Left vertebral artery origin is low but widely patent. There is calcified plaque at the right vertebral artery origin with stenosis estimated at 30-50%. Both vertebral arteries are small but otherwise patent through the cervical region to the foramen magnum. Skeleton: Mid cervical spondylosis. Other neck: No mass or lymphadenopathy. Possible posterior nasal polyp. Upper chest: Advanced emphysema. Review of the MIP images confirms the above findings CTA HEAD FINDINGS Anterior circulation: Both internal carotid arteries are patent through the skull base and siphon regions. There is advanced atherosclerotic disease in the carotid siphon regions with narrowing and irregularity. This is particularly severe on the left where the lumen appears barely patent. Additionally, there is a patent trigeminal artery on the left which is self shows atherosclerotic change in stenosis, giving the majority supply to the basilar tip. Right supraclinoid ICA regains a normal caliber. There is fetal origin of the right PCA. Right anterior and middle cerebral vessels are widely patent presently. On the left, supraclinoid ICA shows 50-70%  stenosis. There is flow in the left anterior and middle cerebral vessels. M3 branch vessel occlusion in the region of the left posterior frontal brain. Posterior circulation: Both vertebral  arteries are patent through the foramen magnum, supplying both picas. There is a tiny basilar artery which is patent to the level where the patent trigeminal artery supplies the distal basilar. Superior cerebellar and posterior cerebral vessels are patent, with fetal origin of the right PCA as noted above. Venous sinuses: Patent and normal. Anatomic variants: See above for discussion of left trigeminal artery. Delayed phase: No abnormal enhancement. Review of the MIP images confirms the above findings CT Brain Perfusion Findings: CBF (<30%) Volume: 16m Perfusion (Tmax>6.0s) volume: 40 formL Mismatch Volume: 241mInfarction Location:Left posterior frontal brain IMPRESSION: Acute infarction in the left posterior frontal brain with region of CBF less than 30% measured at 21 cc and T-max greater than 6 seconds at 44 cc for a 23 cc region of at risk brain. Given this, the patient would be a candidate for consideration of embolectomy. M2-3 branch inclusion, though difficult to identify as pre CT a perfusion imaging causes vascular opacification. Nonstenotic atherosclerotic change at both carotid bifurcations. Focal soft plaque of the upper cervical ICA on the left at the C1 level with stenosis of 40% but no visible ulceration. Very severe atherosclerotic change of both carotid siphon regions with severe stenosis and irregularity. This could serve as a challenge for embolectomy. 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 These results were called by telephone at the time of interpretation on 07/23/2017 at 7:20 am to Dr. ALCharlesetta Ivory who verbally acknowledged these results. Electronically Signed   By: MaNelson Chimes.D.   On: 07/23/2017 07:42   Dg Chest Port 1 View  Result Date: 07/23/2017 CLINICAL DATA:  Stroke. EXAM: PORTABLE CHEST 1 VIEW COMPARISON:  It CT 07/23/2017. FINDINGS: Mediastinum hilar structures  normal. Lungs are clear. No pleural effusion or pneumothorax. Heart size normal. No acute bony abnormality. IMPRESSION: No acute cardiopulmonary disease. Electronically Signed   By: ThMarcello MooresRegister   On: 07/23/2017 16:40   Dg Swallowing Func-speech Pathology  Result Date: 07/24/2017 Objective Swallowing Evaluation: Type of Study: Bedside Swallow Evaluation  Patient Details Name: JaBuel MolderRN: [id number] of Birth: 12/28/1965-10-06oday's Date: 07/24/2017 Time: SLP Start Time (ACUTE ONLY): 1141 -SLP Stop Time (ACUTE ONLY): 1203 SLP Time Calculation (min) (ACUTE ONLY): 22 min Past Medical History: Past Medical History: Diagnosis Date . ETOH abuse  . Hyperlipidemia  . Hypertension  [facility] Past Surgical History: Past Surgical History: Procedure Laterality Date . TEE WITHOUT CARDIOVERSION N/A 11/15/2014  Procedure: TRANSESOPHAGEAL ECHOCARDIOGRAM (TEE);  Surgeon: TiMinna MerrittsMD;  Location: [facility] ORS;  Service: Cardiovascular;  Laterality: N/A; HPI: JaKrish Baillys a left handed [age].o. male [facility] slurred speech, right facial droop, and right sided weakness. PMHx includes CVA, hypertension, hyperlipidemia, and ETOH abuse. CT head with CTA head neck, the latter revealing a left M2 occlusion. CTP revealed a 20 cc core in the left frontal lobe with penumbra of 23 cc. Pt currenlty NPO.  Subjective: Pt is alert and cooperative to assessment Assessment / Plan / Recommendation CHL IP CLINICAL IMPRESSIONS 07/24/2017 Clinical Impression Pt presents with a mild oropharyngeal dysphagia characterized by reduced bolus control and premature spillage leading to inconsistent penetration with thin liquids. Pt had more consistent  penetration with straw presentation. Pt was inconsistently sensate to penetration, with the majority of airway compromise not eliciting response, which may be due to shallow nature. Pt benefited from [place] verbal cues to cough to expel penetrates from laryngeal  vestibule. With use of chin tuck, pt was able to contain spillage in the vallecula and prevent airway compromise. Pt's pharyngeal phase was overtly functional for all other consistencies tested. Recommend thin liquids and regular diet with use of chin tuck, aspiration precautions, no straws, and intermittent supervision. Medications administered whole in puree. SLP will follow-up to ensure compliance with compensatory strategies and aspiration precautions and safety with updated diet recommendations. Continue to recommend OP SLP upon D/C.   SLP Visit Diagnosis Dysphagia, oropharyngeal phase (R13.12) Attention and concentration deficit following -- Frontal lobe and executive function deficit following -- Impact on safety and function Moderate aspiration risk   CHL IP TREATMENT RECOMMENDATION 07/24/2017 Treatment Recommendations Therapy as outlined in treatment plan below   Prognosis 07/24/2017 Prognosis for Safe Diet Advancement Good Barriers to Reach Goals -- Barriers/Prognosis Comment -- CHL IP DIET RECOMMENDATION 07/24/2017 SLP Diet Recommendations Thin liquid;Regular solids Liquid Administration via Cup;No straw Medication Administration Whole meds with puree Compensations Slow rate;Small sips/bites;Chin tuck Postural Changes Seated upright at 90 degrees   CHL IP OTHER RECOMMENDATIONS 07/24/2017 Recommended Consults -- Oral Care Recommendations Oral care BID Other Recommendations --   CHL IP FOLLOW UP RECOMMENDATIONS 07/24/2017 Follow up Recommendations Outpatient SLP   CHL IP FREQUENCY AND DURATION 07/24/2017 Speech Therapy Frequency (ACUTE ONLY) min 2x/week Treatment Duration 2 weeks      CHL IP ORAL PHASE 07/24/2017 Oral Phase Impaired Oral - Pudding Teaspoon -- Oral - Pudding Cup -- Oral - Honey Teaspoon -- Oral - Honey Cup -- Oral - Nectar Teaspoon -- Oral - Nectar Cup WFL Oral - Nectar Straw -- Oral - Thin Teaspoon -- Oral - Thin Cup Premature spillage;Decreased bolus cohesion Oral - Thin Straw Premature  spillage;Decreased bolus cohesion Oral - Puree WFL Oral - Mech Soft -- Oral - Regular WFL Oral - Multi-Consistency -- Oral - Pill -- Oral Phase - Comment --  CHL IP PHARYNGEAL PHASE 07/24/2017 Pharyngeal Phase Impaired Pharyngeal- Pudding Teaspoon -- Pharyngeal -- Pharyngeal- Pudding Cup -- Pharyngeal -- Pharyngeal- Honey Teaspoon -- Pharyngeal -- Pharyngeal- Honey Cup -- Pharyngeal -- Pharyngeal- Nectar Teaspoon -- Pharyngeal -- Pharyngeal- Nectar Cup WFL Pharyngeal -- Pharyngeal- Nectar Straw -- Pharyngeal -- Pharyngeal- Thin Teaspoon -- Pharyngeal -- Pharyngeal- Thin Cup Penetration/Aspiration before swallow;Compensatory strategies attempted (with notebox) Pharyngeal Material enters airway, remains ABOVE vocal cords and not ejected out Pharyngeal- Thin Straw Penetration/Aspiration before swallow Pharyngeal Material enters airway, remains ABOVE vocal cords and not ejected out Pharyngeal- Puree WFL Pharyngeal -- Pharyngeal- Mechanical Soft -- Pharyngeal -- Pharyngeal- Regular WFL Pharyngeal -- Pharyngeal- Multi-consistency -- Pharyngeal -- Pharyngeal- Pill -- Pharyngeal -- Pharyngeal Comment --  CHL IP CERVICAL ESOPHAGEAL PHASE 07/24/2017 Cervical Esophageal Phase WFL Pudding Teaspoon -- Pudding Cup -- Honey Teaspoon -- Honey Cup -- Nectar Teaspoon -- Nectar Cup -- Nectar Straw -- Thin Teaspoon -- Thin Cup -- Thin Straw -- Puree -- Mechanical Soft -- Regular -- Multi-consistency -- Pill -- Cervical Esophageal Comment -- No flowsheet data found. Germain Osgood 07/24/2017, 3:57 PM  Note populated for [place] Jarrett, Student SLP Germain Osgood, M.A. CCC-SLP [phone]             Ir Angio Extracran Sel Com Carotid Innominate Uni Bilat Mod Sed  Result Date: 07/24/2017 CLINICAL DATA:  Severe dysarthria.  Right-sided facial weakness. Abnormal CT angiogram of the head and neck. EXAM: IR ANGIO EXTRACRAN SELECT COM CAROTID INNOMINATE BILAT MOD SED; IR ANGIO VERTEBRAL SEL VERTEBRAL BILAT MOD SED COMPARISON:  CT  angiogram of the head and neck of 07/23/2017. MEDICATIONS: Heparin 1000 units IV; no antibiotic was administered within 1 hour of the procedure. ANESTHESIA/SEDATION: Mac anesthesia per the Department of Anesthesiology at [place] 1 mg IV; Fentanyl 25 mcg IV Moderate Sedation Time:  As per mac anesthesia. Marland Kitchen CONTRAST:  Isovue 300 approximately 65 cc. FLUOROSCOPY TIME:  Fluoroscopy Time: 7 minutes 20 seconds (883 mGy). COMPLICATIONS: None immediate. TECHNIQUE: Informed written consent was obtained from the patient after a thorough discussion of the procedural risks, benefits and alternatives. All questions were addressed. Maximal Sterile Barrier Technique was utilized including caps, mask, sterile gowns, sterile gloves, sterile drape, hand hygiene and skin antiseptic. A timeout was performed prior to the initiation of the procedure. The right groin was prepped and draped in the usual sterile fashion. Thereafter using modified Seldinger technique, transfemoral access into the right common femoral artery was obtained without difficulty. Over a 0.035 inch guidewire, a 5 French Pinnacle sheath was inserted. Through this, and also over 0.035 inch guidewire, a 5 [place] JB 1 catheter was advanced to the aortic arch region and selectively positioned in the right common carotid artery, the right vertebral artery, the left common carotid artery and the left vertebral artery. FINDINGS: The right common carotid arteriogram demonstrates the right external carotid artery to be mildly narrowed at its origin. Its branches are normally opacified. The right internal carotid artery at the bulb to the cranial skull base opacifies normally. The petrous segment is widely patent. There is a tapered severe stenosis of the proximal cavernous segment of 80-85% associated with mild poststenotic dilatation. Distal to this the distal cavernous and supraclinoid segments are widely patent. The right middle cerebral artery and the  right anterior cerebral artery opacify into the capillary and venous phases. There is prompt filling via the anterior communicating artery of the left anterior cerebral artery A1 A2 segments, and also of the left MCA distribution opacifying faintly the inferior and the superior divisions. Inflow of unopacified blood is seen in the proximal middle cerebral artery to be described below. Also demonstrated is a dominant right posterior communicating artery opacifying the right posterior cerebral artery distribution. The origin of the right vertebral artery is normal. The vessel is seen to opacify to the cranial skull base. Wide patency is seen of the right posterior-inferior cerebellar artery and the right vertebrobasilar junction. A tapered hypoplastic right vertebrobasilar junction is seen distal to the right posterior-inferior cerebellar artery. The opacified portion of the basilar artery, the superior cerebellar arteries and the anterior-inferior cerebellar arteries is noted into the capillary and venous phases. Nonvisualization of the posterior cerebral arteries is probably related inflow from the anterior circulation and also from the contralateral left vertebral artery. The left common carotid arteriogram demonstrates the left external carotid artery and its major branches to be widely patent. The left internal carotid artery at the bulb to the cranial skull base demonstrates wide patency. There is a mild smooth atherosclerotic plaque along the posterior wall of the bulb without acute ulcerations or significant stenosis. More distally, the vessel is seen to opacify normally to the cranial skull base. Wide patency is seen of the petrous segment. There is complete angiographic occlusion distal to a prominent vessel arising from the superomedial aspect of the left internal carotid artery  which opacifies the distal basilar artery including the left posterior cerebral artery distribution, and partially the right  posterior cerebral artery distribution and the superior cerebellar arteries. 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 There is delayed filling of the distal cavernous segment of the right internal carotid artery, and also the supraclinoid segments from a significantly prominent ophthalmic artery from collaterals of the external carotid artery in the orbital ethmoidal region. This results in the opacification of the right middle cerebral artery and the right anterior cerebral artery. Unopacified blood is seen within the middle cerebral artery from the contralateral right internal carotid artery via the anterior communicating artery as described earlier. The delayed capillary phase demonstrates suggestion of an occlusion of a perisylvian M3 branch of the superior division. The delayed capillary phase again demonstrates a moderate-sized area of hypoperfusion involving the posterior frontal and the anterior parietal cortical subcortical areas. The hypoplastic left vertebral artery origin is from the medial posterior aspect of the left subclavian artery. The vessel is seen to opacify to the cranial skull base. Opacification of the left vertebrobasilar junction and the left posterior-inferior cerebellar artery is noted. Distal flow is seen into the left vertebrobasilar junction beyond the origin of the left posterior-inferior cerebellar artery and into the basilar artery where it mixes with the unopacified blood from the contralateral vertebral artery. 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 Suggestion of occlusion of a M3 perisylvian branch of the superior division of the  left middle cerebral artery. 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 PLAN: Follow-up in approximately 2 weeks post discharge to discuss the management considerations of the 80-85% stenosis of the right internal carotid artery cavernous segment. Electronically Signed   By: Luanne Bras M.D.   On: 07/23/2017 16:49   Ir Angio Vertebral Sel Vertebral Bilat Mod Sed  Result Date: 07/24/2017 CLINICAL DATA:  Severe dysarthria. Right-sided facial weakness. Abnormal CT angiogram of the head and neck. EXAM: IR ANGIO EXTRACRAN SELECT COM CAROTID INNOMINATE BILAT MOD SED; IR ANGIO VERTEBRAL SEL VERTEBRAL BILAT MOD SED COMPARISON:  CT angiogram of the head and neck of 07/23/2017. MEDICATIONS: Heparin 1000 units IV; no antibiotic was administered within 1 hour of the procedure. ANESTHESIA/SEDATION: Mac anesthesia per the Department of Anesthesiology at [place] 1 mg IV; Fentanyl 25 mcg IV Moderate Sedation Time:  As per mac anesthesia. Marland Kitchen CONTRAST:  Isovue 300 approximately 65 cc. FLUOROSCOPY TIME:  Fluoroscopy Time: 7 minutes 20 seconds (883 mGy). COMPLICATIONS: None immediate. TECHNIQUE: Informed written consent was obtained from the patient after a thorough discussion of the procedural risks, benefits and alternatives. All questions were addressed. Maximal Sterile Barrier Technique was utilized including caps, mask, sterile gowns, sterile gloves, sterile drape, hand hygiene and skin antiseptic. A timeout was performed prior to the initiation of the procedure. The right groin was prepped and draped in the usual sterile fashion. Thereafter using modified Seldinger technique, transfemoral access into the right common femoral artery was obtained without difficulty. Over a 0.035 inch guidewire,  a 5 French Pinnacle sheath was inserted. Through this,  and also over 0.035 inch guidewire, a 5 [place] JB 1 catheter was advanced to the aortic arch region and selectively positioned in the right common carotid artery, the right vertebral artery, the left common carotid artery and the left vertebral artery. FINDINGS: The right common carotid arteriogram demonstrates the right external carotid artery to be mildly narrowed at its origin. Its branches are normally opacified. The right internal carotid artery at the bulb to the cranial skull base opacifies normally. The petrous segment is widely patent. There is a tapered severe stenosis of the proximal cavernous segment of 80-85% associated with mild poststenotic dilatation. Distal to this the distal cavernous and supraclinoid segments are widely patent. The right middle cerebral artery and the right anterior cerebral artery opacify into the capillary and venous phases. There is prompt filling via the anterior communicating artery of the left anterior cerebral artery A1 A2 segments, and also of the left MCA distribution opacifying faintly the inferior and the superior divisions. Inflow of unopacified blood is seen in the proximal middle cerebral artery to be described below. Also demonstrated is a dominant right posterior communicating artery opacifying the right posterior cerebral artery distribution. The origin of the right vertebral artery is normal. The vessel is seen to opacify to the cranial skull base. Wide patency is seen of the right posterior-inferior cerebellar artery and the right vertebrobasilar junction. A tapered hypoplastic right vertebrobasilar junction is seen distal to the right posterior-inferior cerebellar artery. The opacified portion of the basilar artery, the superior cerebellar arteries and the anterior-inferior cerebellar arteries is noted into the capillary and venous phases. Nonvisualization of the posterior cerebral arteries is probably  related inflow from the anterior circulation and also from the contralateral left vertebral artery. The left common carotid arteriogram demonstrates the left external carotid artery and its major branches to be widely patent. The left internal carotid artery at the bulb to the cranial skull base demonstrates wide patency. There is a mild smooth atherosclerotic plaque along the posterior wall of the bulb without acute ulcerations or significant stenosis. More distally, the vessel is seen to opacify normally to the cranial skull base. Wide patency is seen of the petrous segment. There is complete angiographic occlusion distal to a prominent vessel arising from the superomedial aspect of the left internal carotid artery which opacifies the distal basilar artery including the left posterior cerebral artery distribution, and partially the right posterior cerebral artery distribution and the superior cerebellar arteries. 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 There is delayed filling of the distal cavernous segment of the right internal carotid artery, and also the supraclinoid segments from a significantly prominent ophthalmic artery from collaterals of the external carotid artery in the orbital ethmoidal region. This results in the opacification of the right middle cerebral artery and the right anterior cerebral artery. Unopacified blood is seen within the middle cerebral artery from the contralateral right internal carotid artery via the anterior communicating artery as described earlier. The delayed capillary phase demonstrates suggestion of an occlusion of a perisylvian M3 branch of the superior division. The delayed capillary phase again demonstrates a moderate-sized area of hypoperfusion involving the posterior frontal and the anterior parietal cortical subcortical areas. The hypoplastic left vertebral artery origin is from the  medial posterior aspect of the left subclavian artery. The vessel is seen to opacify to the cranial skull base. Opacification of the left vertebrobasilar junction and the left posterior-inferior cerebellar artery  is noted. Distal flow is seen into the left vertebrobasilar junction beyond the origin of the left posterior-inferior cerebellar artery and into the basilar artery where it mixes with the unopacified blood from the contralateral vertebral artery. 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 Suggestion of occlusion of a M3 perisylvian branch of the superior division of the left middle cerebral artery. 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 PLAN: Follow-up in approximately 2 weeks post discharge to discuss the management considerations of the 80-85% stenosis of the right internal carotid artery cavernous segment. Electronically Signed   By: Luanne Bras M.D.   On: 07/23/2017 16:49    Labs:  CBC: Recent Labs    07/23/17 0625 07/23/17 1007 07/25/17 0906  WBC 11.4* 12.7* 8.6  HGB 15.5 15.0 15.0  HCT 47.1 44.7 44.5  PLT 307 267 244    COAGS: Recent Labs    07/23/17 0625  INR 0.93  APTT 30    BMP: Recent Labs    07/23/17 0625 07/23/17 1007 07/25/17 0906  NA 136  --  137  K 3.9  --  4.2  CL 105  --  103  CO2 25  --  24  GLUCOSE 135*  --  178*  BUN 6  --  5*  CALCIUM 8.9  --  9.2  CREATININE 0.76 0.71 0.79  GFRNONAA >60 >60 >60  GFRAA >60 >60 >60    LIVER FUNCTION TESTS: Recent Labs     07/23/17 0625  BILITOT 0.7  AST 21  ALT 10*  ALKPHOS 44  PROT 6.7  ALBUMIN 4.1    TUMOR MARKERS: No results for input(s): AFPTM, CEA, CA199, CHROMGRNA in the last 8760 hours.  Assessment and Plan:  Right ICA stenosis. Imaging reviewed with patient. Patient states that he would like to have his stenosis treated so that he does not have another stroke. Informed patient that it is best to wait 4-6 weeks so that he can return to baseline function with PT/OT and speech therapy.  Plan for image-guided cerebral angiogram with intent to treat in 4-6 weeks. Informed patient that our schedulers will call him in 4-6 weeks to schedule this procedure. Instructed patient to continue taking Plavix 75 mg once daily and Aspirin 81 mg once daily. Instructed patient to continue following-up with PT/OT and speech therapy.  All questions answered and concerns addressed. Patient conveys understanding and agrees with plan.  Thank you for this interesting consult.  I greatly enjoyed meeting Demarcus Thielke and look forward to participating in their care.  A copy of this report was sent to the requesting provider on this date.  Electronically Signed: Earley Abide, PA-C 08/01/2017, 3:48 PM   I spent a total of 30 minutes in face to face in clinical consultation, greater than 50% of which was counseling/coordinating care for right ICA stenosis.

## 2017-08-03 NOTE — Consult Note (Signed)
  [facility] TeleNeurology Consult Services   Impression: Last known well was the night befrore, and woke up with word finidng difficulty. 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 He was outside of the theraputic window.  Differential Diagnosis:                                1. Cardioembolic stroke 2. Small vessel disease/lacune 3. Thromboembolic, artery-to-artery mechanism 4. Hypercoagulable state-related infarct 5. Transient ischemic attack 6. Thrombotic mechanism, large artery disease   Comments:  LAST  Known well: 9 pm [facility] contacted: [phone] a [facility] at bedside:643 am NIHSS assessment time:same   Recommendations: CTA Head/neck if negative, would load with keppra, IV.  If posisitve prepare and tranasfer to [facility] for thrombectomy. Inpatient neurology consultation Inpatient stroke evaluation as per Neurology/ Internal Medicine Discussed with ED MD Please call with questions   -----------------------------------------------------------------------------------------   CC   History of Present Illness   Patient is a 52 y.o. that comes in with aphasia. HE has a history of alcohol abuse.He has a dense right sided hemiparesis.   Past Medical History:  Diagnosis Date  . ETOH abuse   . Hyperlipidemia   . Hypertension   . Stroke [facility])       Diagnostic:  CT head shnows probabale left M2 thrombosis, negative for bleed.     Exam:  NIHSS 18  1A: Level of Consciousness - Alert; keenly responsive 0 1B: Ask Month and Age - Both Questions Right 0 1C: 'Blink Eyes' & 'Squeeze Hands' - Performs 0 Tasks +2 2: Test Horizontal Extraocular Movements - Normal 0 3: Test Visual Fields - No Visual Loss 0 5A: Test Left Arm Motor Drift - No Drift for 10 Seconds 0 5B: Test Right Arm Motor Drift - No Movement +4 6A: Test Left Leg Motor Drift - No Drift for 5 Seconds 0 6B: Test Right Leg Motor Drift - No Movement +4 7: Test Limb Ataxia - No Ataxia  0 8: Test Sensation - Complete Loss: Cannot Sense Being Touched At All +2 9: Test Language/Aphasia - ute/Global Aphasia: No Usable Speech/Auditory Comprehensionute/Global Aphasia: No Usable Speech/Auditory Comprehension +3 10: Test Dysarthria - Severe Dysarthria: Unintelligble Slurring or Out of Proportion to Dysphasia +2 11: Test Extinction/Inattention - Extinction to bilateral simultaneous stimulation +1 References       Medical Decision Making: - Extensive number of diagnosis or management options are considered above.  - Extensive amount of complex data reviewed.  - High risk of complication and/or morbidity or mortality are associated with differential diagnostic considerations above. - There may be Uncertain outcome and increased probability of prolonged functional impairment or high probability of severe prolonged functional impairment associated with some of these differential diagnosis. Medical Data Reviewed: 1.Data reviewed include clinical labs, radiology,  Medical Tests;  2.Tests results discussed w/performing or interpreting physician;  3.Obtaining/reviewing old medical records; 4.Obtaining case history from another source; 5.Independent review of image, tracing or specimen.     Patient was informed the Neurology Consult would happen via telehealth (remote video) and consented to receiving care in this manner.

## 2017-08-07 ENCOUNTER — Telehealth: Payer: Self-pay | Admitting: Neurology

## 2017-08-07 NOTE — Telephone Encounter (Signed)
4/24 LVM to inform pt we have received FMLA paperwork and there is a $50 fee prior to dr filling paperwork out

## 2017-08-15 ENCOUNTER — Other Ambulatory Visit (HOSPITAL_COMMUNITY): Payer: Self-pay | Admitting: Interventional Radiology

## 2017-08-15 DIAGNOSIS — I771 Stricture of artery: Secondary | ICD-10-CM

## 2017-08-21 ENCOUNTER — Telehealth (HOSPITAL_COMMUNITY): Payer: Self-pay | Admitting: Radiology

## 2017-08-21 NOTE — Telephone Encounter (Signed)
Called pt, left VM for him to call to schedule treatment of stenosis with Deveshwar. JM

## 2017-08-26 ENCOUNTER — Ambulatory Visit: Payer: Self-pay | Admitting: Neurology

## 2017-08-26 ENCOUNTER — Encounter: Payer: Self-pay | Admitting: Neurology

## 2017-08-27 ENCOUNTER — Telehealth: Payer: Self-pay

## 2017-08-27 NOTE — Telephone Encounter (Signed)
Patient no show for appt on 08/26/2017.

## 2017-09-05 ENCOUNTER — Encounter: Payer: Self-pay | Admitting: Cardiology

## 2017-09-25 ENCOUNTER — Encounter: Payer: Self-pay | Admitting: Cardiology

## 2017-10-31 ENCOUNTER — Other Ambulatory Visit (HOSPITAL_COMMUNITY): Payer: Self-pay | Admitting: Radiology

## 2017-10-31 ENCOUNTER — Other Ambulatory Visit: Payer: Self-pay | Admitting: Student

## 2017-10-31 DIAGNOSIS — Z7982 Long term (current) use of aspirin: Secondary | ICD-10-CM | POA: Diagnosis not present

## 2017-10-31 DIAGNOSIS — I739 Peripheral vascular disease, unspecified: Secondary | ICD-10-CM | POA: Diagnosis not present

## 2017-10-31 DIAGNOSIS — I69351 Hemiplegia and hemiparesis following cerebral infarction affecting right dominant side: Secondary | ICD-10-CM | POA: Diagnosis not present

## 2017-10-31 DIAGNOSIS — Z7902 Long term (current) use of antithrombotics/antiplatelets: Secondary | ICD-10-CM | POA: Diagnosis not present

## 2017-10-31 DIAGNOSIS — I63513 Cerebral infarction due to unspecified occlusion or stenosis of bilateral middle cerebral arteries: Secondary | ICD-10-CM

## 2017-10-31 DIAGNOSIS — I10 Essential (primary) hypertension: Secondary | ICD-10-CM | POA: Diagnosis not present

## 2017-10-31 DIAGNOSIS — F17210 Nicotine dependence, cigarettes, uncomplicated: Secondary | ICD-10-CM | POA: Diagnosis not present

## 2017-10-31 DIAGNOSIS — E785 Hyperlipidemia, unspecified: Secondary | ICD-10-CM | POA: Diagnosis not present

## 2017-10-31 DIAGNOSIS — I6521 Occlusion and stenosis of right carotid artery: Secondary | ICD-10-CM | POA: Diagnosis not present

## 2017-10-31 LAB — PLATELET INHIBITION P2Y12: PLATELET FUNCTION P2Y12: 115 [PRU] — AB (ref 194–418)

## 2017-11-01 ENCOUNTER — Telehealth (HOSPITAL_COMMUNITY): Payer: Self-pay | Admitting: Radiology

## 2017-11-01 NOTE — Pre-Procedure Instructions (Signed)
RAYBURN MUNDIS  11/01/2017      [id number] [address], [place] [address] Phone: [phone] Fax: [phone]    Your procedure is scheduled on Thurs., November 07, 2017 from 9:30AM-12:30PM  Report to [facility] Admitting Entrance "A" at 7:30AM  Call this number if you have problems the morning of surgery:  [phone]   Remember:  Do not eat or drink after midnight on July 24th    Take these medicines the morning of surgery with A SIP OF WATER: Aspirin (4-tabs) and Clopidogrel (PLAVIX)  As of today, stop taking all Other Aspirin Products, Vitamins, Fish oils, and Herbal medications. Also stop all NSAIDS i.e. Advil, Ibuprofen, Motrin, Aleve, Anaprox, Naproxen, BC, Goody Powders, and all Supplements.    Do not wear jewelry.  Do not wear lotions, powders, colognes, or deodorant.  Do not shave 48 hours prior to surgery.  Men may shave face.  Do not bring valuables to the hospital.  [facility] is not responsible for any belongings or valuables.  Contacts, dentures or bridgework may not be worn into surgery.  Leave your suitcase in the car.  After surgery it may be brought to your room.  For patients admitted to the hospital, discharge time will be determined by your treatment team.  Patients discharged the day of surgery will not be allowed to drive home.   Special instructions:   [facility]- Preparing For Surgery  Before surgery, you can play an important role. Because skin is not sterile, your skin needs to be as free of germs as possible. You can reduce the number of germs on your skin by washing with CHG (chlorahexidine gluconate) Soap before surgery.  CHG is an antiseptic cleaner which kills germs and bonds with the skin to continue killing germs even after washing.    Oral Hygiene is also important to reduce your risk of infection.  Remember - BRUSH YOUR TEETH THE MORNING OF SURGERY WITH YOUR REGULAR  TOOTHPASTE  Please do not use if you have an allergy to CHG or antibacterial soaps. If your skin becomes reddened/irritated stop using the CHG.  Do not shave (including legs and underarms) for at least 48 hours prior to first CHG shower. It is OK to shave your face.  Please follow these instructions carefully.   1. Shower the NIGHT BEFORE SURGERY and the MORNING OF SURGERY with CHG.   2. If you chose to wash your hair, wash your hair first as usual with your normal shampoo.  3. After you shampoo, rinse your hair and body thoroughly to remove the shampoo.  4. Use CHG as you would any other liquid soap. You can apply CHG directly to the skin and wash gently with a scrungie or a clean washcloth.   5. Apply the CHG Soap to your body ONLY FROM THE NECK DOWN.  Do not use on open wounds or open sores. Avoid contact with your eyes, ears, mouth and genitals (private parts). Wash Face and genitals (private parts)  with your normal soap.  6. Wash thoroughly, paying special attention to the area where your surgery will be performed.  7. Thoroughly rinse your body with warm water from the neck down.  8. DO NOT shower/wash with your normal soap after using and rinsing off the CHG Soap.  9. Pat yourself dry with a CLEAN TOWEL.  10. Wear CLEAN PAJAMAS to bed the night before surgery, wear  comfortable clothes the morning of surgery  11. Place CLEAN SHEETS on your bed the night of your first shower and DO NOT SLEEP WITH PETS.  Day of Surgery:  Do not apply any deodorants/lotions.  Please wear clean clothes to the hospital/surgery center.   Remember to brush your teeth WITH YOUR REGULAR TOOTHPASTE.  Please read over the following fact sheets that you were given. Pain Booklet, Coughing and Deep Breathing, MRSA Information and Surgical Site Infection Prevention

## 2017-11-01 NOTE — Telephone Encounter (Signed)
Called pt, left VM that he is to continue as is with Plavix 75mg  1 QD and Aspirin 81mg  1 QD per Dr. Estanislado Pandy. JM

## 2017-11-03 ENCOUNTER — Other Ambulatory Visit: Payer: Self-pay | Admitting: Radiology

## 2017-11-04 ENCOUNTER — Encounter (HOSPITAL_COMMUNITY)
Admission: RE | Admit: 2017-11-04 | Discharge: 2017-11-04 | Disposition: A | Discharge status: Home / Self Care | Payer: Managed Care, Other (non HMO) | Source: Ambulatory Visit | Attending: Interventional Radiology | Admitting: Interventional Radiology

## 2017-11-04 ENCOUNTER — Other Ambulatory Visit: Payer: Self-pay

## 2017-11-04 ENCOUNTER — Encounter (HOSPITAL_COMMUNITY): Payer: Self-pay

## 2017-11-04 DIAGNOSIS — I6521 Occlusion and stenosis of right carotid artery: Secondary | ICD-10-CM | POA: Diagnosis not present

## 2017-11-04 HISTORY — DX: Personal history of other diseases of the circulatory system: Z86.79

## 2017-11-04 LAB — BASIC METABOLIC PANEL
Anion gap: 9 (ref 5–15)
BUN: 7 mg/dL (ref 6–20)
CHLORIDE: 105 mmol/L (ref 98–111)
CO2: 27 mmol/L (ref 22–32)
CREATININE: 0.83 mg/dL (ref 0.61–1.24)
Calcium: 9.4 mg/dL (ref 8.9–10.3)
GFR calc Af Amer: >60 mL/min (ref >60)
GFR calc non Af Amer: >60 mL/min (ref >60)
GLUCOSE: 137 mg/dL — AB (ref 70–99)
POTASSIUM: 3.8 mmol/L (ref 3.5–5.1)
SODIUM: 141 mmol/L (ref 135–145)

## 2017-11-04 LAB — CBC WITH DIFFERENTIAL/PLATELET
Abs Immature Granulocytes: 0 10*3/uL (ref 0.0–0.1)
Basophils Absolute: 0.1 10*3/uL (ref 0.0–0.1)
Basophils Relative: 1 %
EOS ABS: 0.2 10*3/uL (ref 0.0–0.7)
EOS PCT: 2 %
HEMATOCRIT: 45.3 % (ref 39.0–52.0)
HEMOGLOBIN: 14.7 g/dL (ref 13.0–17.0)
Immature Granulocytes: 0 %
LYMPHS ABS: 2 10*3/uL (ref 0.7–4.0)
LYMPHS PCT: 24 %
MCH: 29.9 pg (ref 26.0–34.0)
MCHC: 32.5 g/dL (ref 30.0–36.0)
MCV: 92.3 fL (ref 78.0–100.0)
MONOS PCT: 8 %
Monocytes Absolute: 0.7 10*3/uL (ref 0.1–1.0)
Neutro Abs: 5.7 10*3/uL (ref 1.7–7.7)
Neutrophils Relative %: 65 %
Platelets: 332 10*3/uL (ref 150–400)
RBC: 4.91 MIL/uL (ref 4.22–5.81)
RDW: 12.5 % (ref 11.5–15.5)
WBC: 8.7 10*3/uL (ref 4.0–10.5)

## 2017-11-04 LAB — HEPATIC FUNCTION PANEL
ALBUMIN: 4 g/dL (ref 3.5–5.0)
ALK PHOS: 60 U/L (ref 38–126)
ALT: 12 U/L (ref 0–44)
AST: 14 U/L — ABNORMAL LOW (ref 15–41)
BILIRUBIN INDIRECT: 0.2 mg/dL — AB (ref 0.3–0.9)
Bilirubin, Direct: 0.1 mg/dL (ref 0.0–0.2)
Total Bilirubin: 0.3 mg/dL (ref 0.3–1.2)
Total Protein: 6.4 g/dL — ABNORMAL LOW (ref 6.5–8.1)

## 2017-11-04 LAB — PROTIME-INR
INR: 0.98
PROTHROMBIN TIME: 12.9 s (ref 11.4–15.2)

## 2017-11-04 LAB — SURGICAL PCR SCREEN
MRSA, PCR: NEGATIVE
STAPHYLOCOCCUS AUREUS: NEGATIVE

## 2017-11-04 LAB — PLATELET INHIBITION P2Y12: Platelet Function  P2Y12: 106 [PRU] — ABNORMAL LOW (ref 194–418)

## 2017-11-04 NOTE — Progress Notes (Signed)
IB message sent to Monia Sabal regarding the abnormal P2Y12 of 106.

## 2017-11-04 NOTE — Progress Notes (Signed)
[facility]  Cardiologist - Denies  Chest x-ray - 07/23/17 (E)  EKG - 11/04/17  Stress Test - Denies  ECHO - 07/23/17 (E)  Cardiac Cath - Denies  Sleep Study - Denies CPAP - None  LABS- 11/04/17: CBC w/D, BMP, LFT, PT, P2Y12  ASA- Continue Plavix- Continue   Anesthesia- Yes- Surgical order  Pt denies having chest pain, sob, or fever at this time. All instructions explained to the pt, with a verbal understanding of the material. Pt agrees to go over the instructions while at home for a better understanding. The opportunity to ask questions was provided.

## 2017-11-04 NOTE — Progress Notes (Signed)
Anesthesia  Case:  [id number] Date/Time:  11/07/17 0915   Procedure:  STENTING (N/A )   Anesthesia type:  General   Pre-op diagnosis:  STENOSIS   Location:  [facility] OR ROOM 12 / [facility]   Surgeon:  Luanne Bras, MD      DISCUSSION: Patient is a 52 year old male scheduled for cerebral arteriogram with possible right ICA angioplasty/stent on 11/07/17 by Dr. Estanislado Pandy. 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 IR procedure recommended 4-6 weeks post-CVA to allow time for recovery. He has not yet had an event monitor (recommended as out-patient to assess for afib/PAF given CVA history). By telemetry and EKGs, he has been in [place]. He had a normal echo 07/2017.  History includes CVA (R occipital infarct and subacute R>L parieto-occipital infarct 11/11/14; bilateral MCA infarcts 07/23/17, presented outside tPA window, referred for angio/possible thrombectomy and found occluded LICA and 16% RICA, no emergent intervention, started on ASA/Plavix, out-patient IR f/u for consideration right ICA stent), HLD, HTN, ETOH abuse, smoking.   Based on currently available information, I anticipate that he can proceed as planned if no acute changes.    VS: BP (!) 148/78   Pulse 100   Temp 37 C   Resp 20   Ht 6' 0.25" (1.835 m)   Wt 163 lb 4.8 oz (74.1 kg)   SpO2 98%   BMI 21.99 kg/m   PROVIDERS: Dion Body, MD is PCP [facility], [place]). Last visit 07/30/17.  Rosalin Hawking, MD is neurologist. Patient did not show for his 08/26/17 office visit.   LABS: Labs reviewed: Acceptable for surgery. P2y12 106. (HFP added due to ETOH abuse history.) (all labs ordered are listed, but only abnormal results are displayed)  Labs Reviewed  HEPATIC FUNCTION PANEL - Abnormal; Notable for the following components:      Result Value   Total Protein 6.4 (*)    AST 14 (*)    Indirect Bilirubin 0.2 (*)    All other components within normal limits   BASIC METABOLIC PANEL - Abnormal; Notable for the following components:   Glucose, Bld 137 (*)    All other components within normal limits  PLATELET INHIBITION P2Y12 - Abnormal; Notable for the following components:   Platelet Function  P2Y12 106 (*)    All other components within normal limits  SURGICAL PCR SCREEN  CBC WITH DIFFERENTIAL/PLATELET  PROTIME-INR    IMAGES: PCXR 07/23/17: IMPRESSION: No acute cardiopulmonary disease.  IR ANGIO EXTRACRAN SELECT COM CAROTID INNOMINATE BILAT/VERTEBRAL SEL VERTEBRAL BIL 07/23/17: IMPRESSION: - Angiographically occluded left internal carotid artery in the distal petrous segment, with distal reconstitution of the cavernous segment and the supraclinoid segments with robust retrograde flow via the ophthalmic artery, from the external carotid artery branches in the orbital ethmoid region. - Suggestion of occlusion of a M3 perisylvian branch of the superior division of the left middle cerebral artery. - Approximately 80-85% stenosis of the right internal carotid artery in the proximal cavernous segment. 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 PLAN: Follow-up in approximately 2 weeks post discharge to discuss the management considerations of the 80-85% stenosis of the right internal carotid artery cavernous segment.  CT head/CTA head/neck 07/23/17: IMPRESSION: - Acute infarction in the left posterior frontal brain with region of CBF less than 30% measured at 21 cc and T-max greater than  6 seconds at 44 cc for a 23 cc region of at risk brain. Given this, the patient would be a candidate for consideration of embolectomy. M2-3 branch inclusion, though difficult to identify as pre CT a perfusion imaging causes vascular opacification. - Nonstenotic atherosclerotic change at both carotid bifurcations. Focal soft plaque of the upper  cervical ICA on the left at the C1 level with stenosis of 40% but no visible ulceration. - Very severe atherosclerotic change of both carotid siphon regions with severe stenosis and irregularity. This could serve as a challenge for embolectomy. 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   EKG: EKG 11/04/17 (repeated to f/u tachycardia, non-specific inferior T wave abnormality): NSR, rightward axis. Rate is slower when compared to 07/23/17, but overall, I think tracing is stable when compared to 11/11/14 and 07/23/17 tracings.    CV: Echo 07/23/17: Study Conclusions - Left ventricle: The cavity size was normal. Wall thickness was   normal. Systolic function was normal. The estimated ejection   fraction was in the range of 55% to 60%. Wall motion was normal;   there were no regional wall motion abnormalities. Left   ventricular diastolic function parameters were normal. - Mitral valve: Mildly thickened leaflets . There was trivial   regurgitation. - Left atrium: The atrium was normal in size. - Inferior vena cava: The vessel was normal in size. The   respirophasic diameter changes were in the normal range (>= 50%),   consistent with normal central venous pressure. Impressions: - Normal study.  TEE 11/15/14: Study Conclusions - Left ventricle: Systolic function was normal. The estimated   ejection fraction was in the range of 60% to 65%. Wall motion was   normal; there were no regional wall motion abnormalities. - Left atrium: No evidence of thrombus in the atrial cavity or   appendage. - Right atrium: No evidence of thrombus in the atrial cavity or   appendage. Impressions: - Normal study.  Past Medical History:  Diagnosis Date  . ETOH abuse   . History of cerebral artery stenosis   . Hyperlipidemia   . Hypertension   . Stroke [facility])     Past Surgical History:   Procedure Laterality Date  . IR ANGIO EXTRACRAN SEL COM CAROTID INNOMINATE UNI BILAT MOD SED  07/23/2017  . IR ANGIO VERTEBRAL SEL VERTEBRAL BILAT MOD SED  07/23/2017  . TEE WITHOUT CARDIOVERSION N/A 11/15/2014   Procedure: TRANSESOPHAGEAL ECHOCARDIOGRAM (TEE);  Surgeon: Minna Merritts, MD;  Location: [facility] ORS;  Service: Cardiovascular;  Laterality: N/A;  . TONSILLECTOMY      MEDICATIONS: . aspirin EC 81 MG tablet  . clopidogrel (PLAVIX) 75 MG tablet  . lisinopril (PRINIVIL,ZESTRIL) 10 MG tablet  . Multiple Vitamin (MULTIVITAMIN WITH MINERALS) TABS tablet  . pravastatin (PRAVACHOL) 40 MG tablet   No current facility-administered medications for this encounter.    [facility] [place] Short Stay Center/Anesthesiology Phone [phone] 11/04/2017 5:29 PM

## 2017-11-06 ENCOUNTER — Other Ambulatory Visit: Payer: Self-pay | Admitting: Radiology

## 2017-11-07 ENCOUNTER — Ambulatory Visit (HOSPITAL_COMMUNITY)
Admission: RE | Admit: 2017-11-07 | Discharge: 2017-11-07 | Disposition: A | Discharge status: Home / Self Care | Payer: Managed Care, Other (non HMO) | Source: Ambulatory Visit | Attending: Interventional Radiology | Admitting: Interventional Radiology

## 2017-11-07 ENCOUNTER — Encounter (HOSPITAL_COMMUNITY): Payer: Self-pay | Admitting: *Deleted

## 2017-11-07 ENCOUNTER — Encounter (HOSPITAL_COMMUNITY)
Admission: RE | Disposition: A | Discharge status: Home / Self Care | Payer: Self-pay | Source: Ambulatory Visit | Attending: Interventional Radiology

## 2017-11-07 ENCOUNTER — Ambulatory Visit (HOSPITAL_COMMUNITY): Payer: Managed Care, Other (non HMO) | Admitting: Vascular Surgery

## 2017-11-07 ENCOUNTER — Encounter (HOSPITAL_COMMUNITY): Payer: Self-pay | Admitting: Anesthesiology

## 2017-11-07 ENCOUNTER — Encounter (HOSPITAL_COMMUNITY): Payer: Self-pay

## 2017-11-07 ENCOUNTER — Ambulatory Visit (HOSPITAL_COMMUNITY): Payer: Managed Care, Other (non HMO) | Admitting: Certified Registered Nurse Anesthetist

## 2017-11-07 DIAGNOSIS — F17210 Nicotine dependence, cigarettes, uncomplicated: Secondary | ICD-10-CM | POA: Insufficient documentation

## 2017-11-07 DIAGNOSIS — I771 Stricture of artery: Secondary | ICD-10-CM

## 2017-11-07 DIAGNOSIS — Z7982 Long term (current) use of aspirin: Secondary | ICD-10-CM | POA: Insufficient documentation

## 2017-11-07 DIAGNOSIS — I6521 Occlusion and stenosis of right carotid artery: Secondary | ICD-10-CM | POA: Insufficient documentation

## 2017-11-07 DIAGNOSIS — Z7902 Long term (current) use of antithrombotics/antiplatelets: Secondary | ICD-10-CM | POA: Insufficient documentation

## 2017-11-07 DIAGNOSIS — I739 Peripheral vascular disease, unspecified: Secondary | ICD-10-CM | POA: Insufficient documentation

## 2017-11-07 DIAGNOSIS — I10 Essential (primary) hypertension: Secondary | ICD-10-CM | POA: Insufficient documentation

## 2017-11-07 DIAGNOSIS — I69351 Hemiplegia and hemiparesis following cerebral infarction affecting right dominant side: Secondary | ICD-10-CM | POA: Insufficient documentation

## 2017-11-07 DIAGNOSIS — E785 Hyperlipidemia, unspecified: Secondary | ICD-10-CM | POA: Insufficient documentation

## 2017-11-07 HISTORY — PX: IR ANGIO INTRA EXTRACRAN SEL COM CAROTID INNOMINATE UNI R MOD SED: IMG5359

## 2017-11-07 HISTORY — PX: IR ANGIO VERTEBRAL SEL SUBCLAVIAN INNOMINATE UNI R MOD SED: IMG5365

## 2017-11-07 HISTORY — PX: RADIOLOGY WITH ANESTHESIA: SHX6223

## 2017-11-07 LAB — PLATELET INHIBITION P2Y12: Platelet Function  P2Y12: 110 [PRU] — ABNORMAL LOW (ref 194–418)

## 2017-11-07 LAB — POCT ACTIVATED CLOTTING TIME: Activated Clotting Time: 180 seconds

## 2017-11-07 SURGERY — IR WITH ANESTHESIA
Anesthesia: Monitor Anesthesia Care

## 2017-11-07 MED ORDER — ASPIRIN EC 325 MG PO TBEC
325.0000 mg | DELAYED_RELEASE_TABLET | ORAL | Status: DC
  Filled 2017-11-07: qty 1

## 2017-11-07 MED ORDER — HEPARIN SODIUM (PORCINE) 1000 UNIT/ML IJ SOLN
INTRAMUSCULAR | Status: DC | PRN
  Administered 2017-11-07: 3000 [IU] via INTRAVENOUS

## 2017-11-07 MED ORDER — ROCURONIUM BROMIDE 100 MG/10ML IV SOLN
INTRAVENOUS | Status: DC | PRN
  Administered 2017-11-07: 50 mg via INTRAVENOUS

## 2017-11-07 MED ORDER — PROPOFOL 10 MG/ML IV BOLUS
INTRAVENOUS | Status: DC | PRN
  Administered 2017-11-07: 200 mg via INTRAVENOUS

## 2017-11-07 MED ORDER — SUGAMMADEX SODIUM 200 MG/2ML IV SOLN
INTRAVENOUS | Status: DC | PRN
  Administered 2017-11-07: 200 mg via INTRAVENOUS

## 2017-11-07 MED ORDER — SODIUM CHLORIDE 0.9 % IV SOLN
INTRAVENOUS | Status: AC
  Administered 2017-11-07: 12:00:00 via INTRAVENOUS

## 2017-11-07 MED ORDER — ONDANSETRON HCL 4 MG/2ML IJ SOLN
INTRAMUSCULAR | Status: DC | PRN
  Administered 2017-11-07: 4 mg via INTRAVENOUS

## 2017-11-07 MED ORDER — MIDAZOLAM HCL 5 MG/5ML IJ SOLN
INTRAMUSCULAR | Status: DC | PRN
  Administered 2017-11-07: 2 mg via INTRAVENOUS

## 2017-11-07 MED ORDER — OXYCODONE HCL 5 MG/5ML PO SOLN: 5.0000 mg | Freq: Once | ORAL | Status: DC | PRN

## 2017-11-07 MED ORDER — CEFAZOLIN SODIUM-DEXTROSE 2-4 GM/100ML-% IV SOLN
INTRAVENOUS | Status: AC
  Filled 2017-11-07: qty 100

## 2017-11-07 MED ORDER — SODIUM CHLORIDE 0.9 % IV SOLN: INTRAVENOUS | Status: DC

## 2017-11-07 MED ORDER — PROMETHAZINE HCL 25 MG/ML IJ SOLN
6.2500 mg | INTRAMUSCULAR | Status: DC | PRN
Start: 2017-11-07 — End: 2017-11-07

## 2017-11-07 MED ORDER — NITROGLYCERIN 1 MG/10 ML FOR IR/CATH LAB
INTRA_ARTERIAL | Status: AC
  Filled 2017-11-07: qty 10

## 2017-11-07 MED ORDER — FENTANYL CITRATE (PF) 100 MCG/2ML IJ SOLN: 25.0000 ug | INTRAMUSCULAR | Status: DC | PRN

## 2017-11-07 MED ORDER — LABETALOL HCL 5 MG/ML IV SOLN
INTRAVENOUS | Status: DC | PRN
  Administered 2017-11-07 (×2): 10 mg via INTRAVENOUS

## 2017-11-07 MED ORDER — FENTANYL CITRATE (PF) 100 MCG/2ML IJ SOLN
INTRAMUSCULAR | Status: DC | PRN
  Administered 2017-11-07: 100 ug via INTRAVENOUS

## 2017-11-07 MED ORDER — LIDOCAINE HCL (CARDIAC) PF 100 MG/5ML IV SOSY
PREFILLED_SYRINGE | INTRAVENOUS | Status: DC | PRN
  Administered 2017-11-07: 30 mg via INTRAVENOUS

## 2017-11-07 MED ORDER — CLOPIDOGREL BISULFATE 75 MG PO TABS
75.0000 mg | ORAL_TABLET | ORAL | Status: AC
  Administered 2017-11-07: 75 mg via ORAL
  Filled 2017-11-07 (×2): qty 1

## 2017-11-07 MED ORDER — LACTATED RINGERS IV SOLN
INTRAVENOUS | Status: DC | PRN
  Administered 2017-11-07: 09:00:00 via INTRAVENOUS

## 2017-11-07 MED ORDER — LACTATED RINGERS IV SOLN
INTRAVENOUS | Status: DC
  Administered 2017-11-07: 09:00:00 via INTRAVENOUS

## 2017-11-07 MED ORDER — IOHEXOL 300 MG/ML  SOLN
150.0000 mL | Freq: Once | INTRAMUSCULAR | Status: AC | PRN
  Administered 2017-11-07: 60 mL via INTRA_ARTERIAL

## 2017-11-07 MED ORDER — CEFAZOLIN SODIUM-DEXTROSE 2-4 GM/100ML-% IV SOLN
2.0000 g | INTRAVENOUS | Status: AC
  Administered 2017-11-07: 2 g via INTRAVENOUS
  Filled 2017-11-07: qty 100

## 2017-11-07 MED ORDER — NIMODIPINE 30 MG PO CAPS: 0.0000 mg | ORAL_CAPSULE | ORAL | Status: DC

## 2017-11-07 MED ORDER — OXYCODONE HCL 5 MG PO TABS: 5.0000 mg | ORAL_TABLET | Freq: Once | ORAL | Status: DC | PRN

## 2017-11-07 NOTE — H&P (Signed)
Chief Complaint: Patient was seen in consultation today for cerebral arteriogram with possible right internal carotid artery angioplasty/stent placement at the request of Dr Lavera Guise   Supervising Physician: Luanne Bras  Patient Status: [facility] - Out-pt  History of Present Illness: Henry Boone is a 52 y.o. male   CVA 2016 CVA 07/2017  07/2017 Cerebral arteriogram:  IMPRESSION: Angiographically occluded left internal carotid artery in the distal petrous segment, with distal reconstitution of the cavernous segment and the supraclinoid segments with robust retrograde flow via the ophthalmic artery, from the external carotid artery branches in the orbital ethmoid region. Suggestion of occlusion of a M3 perisylvian branch of the superior division of the left middle cerebral artery. 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  Consulted with Dr Estanislado Pandy and planned for R ICA angioplasty/stent  Scheduled now for same  Pt states he is virtually asymptomatic at this time Does have some minimal numbness in right first three fingers and right mouth area Denies headache; denies N/V Denies vision or speech changes Eating and drinking well Denies dizziness; vertigo    Past Medical History:  Diagnosis Date  . ETOH abuse   . History of cerebral artery stenosis   . Hyperlipidemia   . Hypertension   . Stroke [facility])     Past Surgical History:  Procedure Laterality Date  . IR ANGIO EXTRACRAN SEL COM CAROTID INNOMINATE UNI BILAT MOD SED  07/23/2017  . IR ANGIO VERTEBRAL SEL VERTEBRAL BILAT MOD SED  07/23/2017  . TEE WITHOUT CARDIOVERSION N/A 11/15/2014   Procedure: TRANSESOPHAGEAL ECHOCARDIOGRAM (TEE);  Surgeon: Minna Merritts, MD;  Location: [facility] ORS;  Service:  Cardiovascular;  Laterality: N/A;  . TONSILLECTOMY      Allergies: Patient has no known allergies.  Medications: Prior to Admission medications   Medication Sig Start Date End Date Taking? Authorizing Provider  aspirin EC 81 MG tablet Take 81 mg by mouth daily.    [provider]  clopidogrel (PLAVIX) 75 MG tablet Take 75 mg by mouth daily. 09/23/17   [provider]  lisinopril (PRINIVIL,ZESTRIL) 10 MG tablet Take 10 mg by mouth daily. 06/30/17   [provider]  Multiple Vitamin (MULTIVITAMIN WITH MINERALS) TABS tablet Take 1 tablet by mouth daily. 11/15/14   Aldean Jewett, MD  pravastatin (PRAVACHOL) 40 MG tablet Take 1 tablet (40 mg total) by mouth daily. 11/15/14   Aldean Jewett, MD     History reviewed. No pertinent family history.  Social History   Socioeconomic History  . Marital status: Single    Spouse name: Not on file  . Number of children: Not on file  . Years of education: Not on file  . Highest education level: Not on file  Occupational History  . Not on file  Social Needs  . Financial resource strain: Not on file  . Food insecurity:    Worry: Not on file    Inability: Not on file  . Transportation needs:    Medical: Not on file    Non-medical: Not on file  Tobacco Use  . Smoking status: Current Every Day Smoker    Packs/day: 0.25    Types: Cigarettes  . Smokeless tobacco: Never Used  Substance and Sexual Activity  . Alcohol use: Not Currently  . Drug use: Never  . Sexual activity: Yes  Lifestyle  . Physical activity:    Days per week: Not on file    Minutes per session: Not on file  . Stress: Not on file  Relationships  . Social connections:    Talks on phone: Not on file    Gets together: Not on file    Attends religious service: Not on file    Active member of club or organization: Not on file    Attends meetings of clubs or organizations: Not on file    Relationship status: Not on file  Other Topics Concern    . Not on file  Social History Narrative  . Not on file   Review of Systems: A 12 point ROS discussed and pertinent positives are indicated in the HPI above.  All other systems are negative.  Review of Systems  Constitutional: Negative for activity change, fatigue and fever.  HENT: Negative for hearing loss, tinnitus and trouble swallowing.   Eyes: Negative for visual disturbance.  Respiratory: Negative for cough and shortness of breath.   Cardiovascular: Negative for chest pain.  Gastrointestinal: Negative for abdominal pain, nausea and vomiting.  Musculoskeletal: Negative for back pain and gait problem.  Neurological: Positive for numbness. Negative for dizziness, tremors, seizures, syncope, facial asymmetry, speech difficulty, weakness, light-headedness and headaches.  Psychiatric/Behavioral: Negative for behavioral problems, confusion and decreased concentration.    Vital Signs: There were no vitals taken for this visit.  Physical Exam  Constitutional: He is oriented to person, place, and time. He appears well-nourished.  HENT:  Head: Atraumatic.  Eyes: EOM are normal.  Neck: Neck supple.  Cardiovascular: Normal rate, regular rhythm and normal heart sounds.  No murmur heard. Pulmonary/Chest: Effort normal and breath sounds normal. He has no wheezes.  Abdominal: Soft. Bowel sounds are normal. There is no tenderness.  Musculoskeletal: Normal range of motion. He exhibits no edema or deformity.  Neurological: He is alert and oriented to person, place, and time.  Skin: Skin is warm and dry.  Psychiatric: He has a normal mood and affect. His behavior is normal. Judgment and thought content normal.  Nursing note and vitals reviewed.   Imaging: No results found.  Labs:  CBC: Recent Labs    07/23/17 0625 07/23/17 1007 07/25/17 0906 11/04/17 1432  WBC 11.4* 12.7* 8.6 8.7  HGB 15.5 15.0 15.0 14.7  HCT 47.1 44.7 44.5 45.3  PLT 307 267 244 332    COAGS: Recent Labs     07/23/17 0625 11/04/17 1432  INR 0.93 0.98  APTT 30  --     BMP: Recent Labs    07/23/17 0625 07/23/17 1007 07/25/17 0906 11/04/17 1432  NA 136  --  137 141  K 3.9  --  4.2 3.8  CL 105  --  103 105  CO2 25  --  24 27  GLUCOSE 135*  --  178* 137*  BUN 6  --  5* 7  CALCIUM 8.9  --  9.2 9.4  CREATININE 0.76 0.71 0.79 0.83  GFRNONAA >60 >60 >60 >60  GFRAA >60 >60 >60 >60    LIVER FUNCTION TESTS: Recent Labs    07/23/17 0625 11/04/17 1432  BILITOT 0.7 0.3  AST 21 14*  ALT 10* 12  ALKPHOS 44 60  PROT 6.7 6.4*  ALBUMIN 4.1 4.0    TUMOR MARKERS: No results for input(s): AFPTM, CEA, CA199, CHROMGRNA in the last 8760 hours.  Assessment and Plan:  Right internal carotid artery stenosis Scheduled for cerebral arteriogram with possible  angioplasty/stent placement Risks and benefits of cerebral angiogram with intervention were discussed with the patient including, but not limited to bleeding, infection, vascular injury, contrast induced renal failure, stroke or even death.  This interventional procedure involves the use of X-rays and because of the nature of the planned procedure, it is possible that we will have prolonged use of X-ray fluoroscopy.  Potential radiation risks to you include (but are not limited to) the following: - A slightly elevated risk for cancer  several years later in life. This risk is typically less than 0.5% percent. This risk is low in comparison to the normal incidence of human cancer, which is 33% for women and 50% for men according to the [place]. - Radiation induced injury can include skin redness, resembling a rash, tissue breakdown / ulcers and hair loss (which can be temporary or permanent).   The likelihood of either of these occurring depends on the difficulty of the procedure and whether you are sensitive to radiation due to previous procedures, disease, or genetic conditions.   IF your procedure requires a prolonged  use of radiation, you will be notified and given written instructions for further action.  It is your responsibility to monitor the irradiated area for the 2 weeks following the procedure and to notify your physician if you are concerned that you have suffered a radiation induced injury.    All of the patient's questions were answered, patient is agreeable to proceed.  Consent signed and in chart.  Pt is aware if intervention is performed, he will be admitted to Neuro ICU overnight and plan for discharge in am He is agreeable   Thank you for this interesting consult.  I greatly enjoyed meeting Henry Boone and look forward to participating in their care.  A copy of this report was sent to the requesting provider on this date.  Electronically Signed: Lavonia Drafts, PA-C 11/07/2017, 8:42 AM   I spent a total of    40 Minutes in face to face in clinical consultation, greater than 50% of which was counseling/coordinating care for cerebral arteriogram; R ICA intervention

## 2017-11-07 NOTE — Sedation Documentation (Signed)
Dr Estanislado Pandy entered room. Time out completed. Patient remains under care of CRNA

## 2017-11-07 NOTE — Transfer of Care (Signed)
Immediate Anesthesia Transfer of Care Note  Patient: [name]  Procedure(s) Performed: STENTING (N/A )  Patient Location: PACU  Anesthesia Type:General  Level of Consciousness: awake, alert  and oriented  Airway & Oxygen Therapy: Patient Spontanous Breathing  Post-op Assessment: Report given to RN and Post -op Vital signs reviewed and stable  Post vital signs: Reviewed and stable  Last Vitals:  Vitals Value Taken Time  BP    Temp    Pulse    Resp    SpO2      Last Pain:  Vitals:   11/07/17 0746  TempSrc: Oral  PainSc: 0-No pain         Complications: No apparent anesthesia complications

## 2017-11-07 NOTE — Anesthesia Procedure Notes (Signed)
Procedure Name: Intubation Date/Time: 11/07/2017 10:01 AM Performed by: Eligha Bridegroom, CRNA Pre-anesthesia Checklist: Patient identified, Emergency Drugs available, Suction available, Patient being monitored and Timeout performed Patient Re-evaluated:Patient Re-evaluated prior to induction Oxygen Delivery Method: Circle system utilized Preoxygenation: Pre-oxygenation with 100% oxygen Induction Type: IV induction Ventilation: Mask ventilation without difficulty and Oral airway inserted - appropriate to patient size Laryngoscope Size: Mac and 4 Grade View: Grade II Tube type: Oral Tube size: 7.5 mm Number of attempts: 1 Airway Equipment and Method: Stylet Placement Confirmation: ETT inserted through vocal cords under direct vision,  positive ETCO2 and breath sounds checked- equal and bilateral Secured at: 22 cm Tube secured with: Tape Dental Injury: Teeth and Oropharynx as per pre-operative assessment

## 2017-11-07 NOTE — Sedation Documentation (Signed)
Patient remains under care of CRNA. Being transferred to PACU bay 4

## 2017-11-07 NOTE — Sedation Documentation (Signed)
Patient under care of CRNA for procedure. RN is in the room and available if needed.

## 2017-11-07 NOTE — Procedures (Signed)
S/P Rt common carotid arteriogram. RT CFA approach. Findings. 1.Approx 55 % stenosis of RT ICA distal  Petrous horizontal  segment.

## 2017-11-07 NOTE — Anesthesia Postprocedure Evaluation (Signed)
Anesthesia Post Note  Patient: Henry Boone  Procedure(s) Performed: STENTING (N/A )     Patient location during evaluation: PACU Anesthesia Type: General Level of consciousness: awake and alert Pain management: pain level controlled Vital Signs Assessment: post-procedure vital signs reviewed and stable Respiratory status: spontaneous breathing, nonlabored ventilation and respiratory function stable Cardiovascular status: blood pressure returned to baseline and stable Postop Assessment: no apparent nausea or vomiting Anesthetic complications: no    Last Vitals:  Vitals:   11/07/17 1234 11/07/17 1252  BP: 111/74 111/73  Pulse: 73 78  Resp: 18 13  Temp:    SpO2: 98% 99%    Last Pain:  Vitals:   11/07/17 1252  TempSrc:   PainSc: 0-No pain                 [name]

## 2017-11-07 NOTE — H&P (Deleted)
  The note originally documented on this encounter has been moved the the encounter in which it belongs.  

## 2017-11-07 NOTE — Anesthesia Preprocedure Evaluation (Addendum)
Anesthesia Evaluation  Patient identified by MRN, date of birth, ID band Patient awake    Reviewed: Allergy & Precautions, NPO status , Patient's Chart, lab work & pertinent test results  History of Anesthesia Complications Negative for: history of anesthetic complications  Airway Mallampati: II  TM Distance: >3 FB Neck ROM: Full    Dental  (+) Dental Advisory Given, Chipped   Pulmonary Current Smoker,    breath sounds clear to auscultation       Cardiovascular hypertension, Pt. on medications + Peripheral Vascular Disease   Rhythm:Regular Rate:Normal   '19 TTE - EF 55-60%, trivial MR    Neuro/Psych  Cerebral artery stenosis  CVA (Right hand tingling/numbness), Residual Symptoms negative psych ROS   GI/Hepatic negative GI ROS, (+)     substance abuse  alcohol use,   Endo/Other  negative endocrine ROS  Renal/GU negative Renal ROS     Musculoskeletal negative musculoskeletal ROS (+)   Abdominal   Peds  Hematology negative hematology ROS (+)   Anesthesia Other Findings   Reproductive/Obstetrics                            Anesthesia Physical Anesthesia Plan  ASA: III  Anesthesia Plan: MAC and General   Post-op Pain Management:    Induction: Intravenous  PONV Risk Score and Plan: 2 and Propofol infusion, Treatment may vary due to age or medical condition and Ondansetron  Airway Management Planned: Nasal Cannula, Natural Airway and Oral ETT  Additional Equipment: Arterial line  Intra-op Plan:   Post-operative Plan: Extubation in OR  Informed Consent: I have reviewed the patients History and Physical, chart, labs and discussed the procedure including the risks, benefits and alternatives for the proposed anesthesia with the patient or authorized representative who has indicated his/her understanding and acceptance.   Dental advisory given  Plan Discussed with: CRNA and  Anesthesiologist  Anesthesia Plan Comments: (May start procedure under MAC for imaging purposes and transition to GETA as indicated)       Anesthesia Quick Evaluation

## 2017-11-08 ENCOUNTER — Encounter (HOSPITAL_COMMUNITY): Payer: Self-pay | Admitting: Interventional Radiology

## 2017-11-11 ENCOUNTER — Other Ambulatory Visit (HOSPITAL_COMMUNITY): Payer: Self-pay | Admitting: Interventional Radiology

## 2017-11-11 ENCOUNTER — Encounter (HOSPITAL_COMMUNITY): Payer: Self-pay | Admitting: Interventional Radiology

## 2017-11-11 DIAGNOSIS — I771 Stricture of artery: Secondary | ICD-10-CM

## 2018-05-13 ENCOUNTER — Telehealth (HOSPITAL_COMMUNITY): Payer: Self-pay | Admitting: Radiology

## 2018-05-13 NOTE — Telephone Encounter (Signed)
Called pt, left VM for him to call to schedule consult with Deveshwar. JM

## 2019-01-16 ENCOUNTER — Other Ambulatory Visit
Admission: RE | Admit: 2019-01-16 | Discharge: 2019-01-16 | Disposition: A | Discharge status: Home / Self Care | Payer: Managed Care, Other (non HMO) | Source: Ambulatory Visit | Attending: Internal Medicine | Admitting: Internal Medicine

## 2019-01-16 DIAGNOSIS — Z20828 Contact with and (suspected) exposure to other viral communicable diseases: Secondary | ICD-10-CM | POA: Insufficient documentation

## 2019-01-16 DIAGNOSIS — Z01812 Encounter for preprocedural laboratory examination: Secondary | ICD-10-CM | POA: Diagnosis not present

## 2019-01-16 LAB — SARS CORONAVIRUS 2 (TAT 6-24 HRS): SARS Coronavirus 2: NEGATIVE

## 2019-01-19 ENCOUNTER — Encounter: Payer: Self-pay | Admitting: *Deleted

## 2019-01-20 ENCOUNTER — Ambulatory Visit: Payer: Managed Care, Other (non HMO) | Admitting: Certified Registered Nurse Anesthetist

## 2019-01-20 ENCOUNTER — Ambulatory Visit
Admission: RE | Admit: 2019-01-20 | Discharge: 2019-01-20 | Disposition: A | Discharge status: Home / Self Care | Payer: Managed Care, Other (non HMO) | Attending: Internal Medicine | Admitting: Internal Medicine

## 2019-01-20 ENCOUNTER — Encounter
Admission: RE | Disposition: A | Discharge status: Home / Self Care | Payer: Self-pay | Source: Home / Self Care | Attending: Internal Medicine

## 2019-01-20 ENCOUNTER — Encounter: Payer: Self-pay | Admitting: Certified Registered Nurse Anesthetist

## 2019-01-20 DIAGNOSIS — Z7902 Long term (current) use of antithrombotics/antiplatelets: Secondary | ICD-10-CM | POA: Diagnosis not present

## 2019-01-20 DIAGNOSIS — Z8673 Personal history of transient ischemic attack (TIA), and cerebral infarction without residual deficits: Secondary | ICD-10-CM | POA: Insufficient documentation

## 2019-01-20 DIAGNOSIS — I10 Essential (primary) hypertension: Secondary | ICD-10-CM | POA: Diagnosis not present

## 2019-01-20 DIAGNOSIS — E785 Hyperlipidemia, unspecified: Secondary | ICD-10-CM | POA: Insufficient documentation

## 2019-01-20 DIAGNOSIS — Z1211 Encounter for screening for malignant neoplasm of colon: Secondary | ICD-10-CM | POA: Diagnosis not present

## 2019-01-20 DIAGNOSIS — Z7982 Long term (current) use of aspirin: Secondary | ICD-10-CM | POA: Diagnosis not present

## 2019-01-20 DIAGNOSIS — F17210 Nicotine dependence, cigarettes, uncomplicated: Secondary | ICD-10-CM | POA: Insufficient documentation

## 2019-01-20 DIAGNOSIS — Z79899 Other long term (current) drug therapy: Secondary | ICD-10-CM | POA: Insufficient documentation

## 2019-01-20 HISTORY — PX: COLONOSCOPY WITH PROPOFOL: SHX5780

## 2019-01-20 SURGERY — COLONOSCOPY WITH PROPOFOL
Anesthesia: General

## 2019-01-20 MED ORDER — PROPOFOL 10 MG/ML IV BOLUS
INTRAVENOUS | Status: DC | PRN
  Administered 2019-01-20: 19 mg via INTRAVENOUS
  Administered 2019-01-20: 80 mg via INTRAVENOUS
  Administered 2019-01-20: 19 mg via INTRAVENOUS

## 2019-01-20 MED ORDER — PROPOFOL 500 MG/50ML IV EMUL
INTRAVENOUS | Status: DC | PRN
  Administered 2019-01-20: 120 ug/kg/min via INTRAVENOUS

## 2019-01-20 MED ORDER — PROPOFOL 500 MG/50ML IV EMUL
INTRAVENOUS | Status: AC
  Filled 2019-01-20: qty 50

## 2019-01-20 MED ORDER — SODIUM CHLORIDE 0.9 % IV SOLN
INTRAVENOUS | Status: DC
  Administered 2019-01-20: 1000 mL via INTRAVENOUS

## 2019-01-20 MED ORDER — LIDOCAINE HCL (CARDIAC) PF 100 MG/5ML IV SOSY
PREFILLED_SYRINGE | INTRAVENOUS | Status: DC | PRN
  Administered 2019-01-20: 50 mg via INTRAVENOUS

## 2019-01-20 NOTE — Interval H&P Note (Signed)
History and Physical Interval Note:  01/20/2019 11:08 AM  Henry Boone  has presented today for surgery, with the diagnosis of SCREENING.  The various methods of treatment have been discussed with the patient and family. After consideration of risks, benefits and other options for treatment, the patient has consented to  Procedure(s): COLONOSCOPY WITH PROPOFOL (N/A) as a surgical intervention.  The patient's history has been reviewed, patient examined, no change in status, stable for surgery.  I have reviewed the patient's chart and labs.  Questions were answered to the patient's satisfaction.     [place], [place]

## 2019-01-20 NOTE — Anesthesia Post-op Follow-up Note (Signed)
Anesthesia QCDR form completed.        

## 2019-01-20 NOTE — Op Note (Signed)
[facility] Gastroenterology Patient Name: Henry Boone Procedure Date: 01/20/2019 11:11 AM MRN: [id number] Account #: [id number] Date of Birth: 07/20/1965 Admit Type: Outpatient Age: 53 Room: [facility] ENDO ROOM 2 Gender: Male Note Status: Finalized Procedure:            Colonoscopy Indications:          Screening for colorectal malignant neoplasm Providers:            Benay Pike. Alice Reichert MD, MD Referring MD:         Dion Body (Referring MD) Medicines:            Propofol per Anesthesia Complications:        No immediate complications. Procedure:            Pre-Anesthesia Assessment:                       - The risks and benefits of the procedure and the                        sedation options and risks were discussed with the                        patient. All questions were answered and informed                        consent was obtained.                       - Patient identification and proposed procedure were                        verified prior to the procedure by the nurse. The                        procedure was verified in the procedure room.                       - ASA Grade Assessment: III - A patient with severe                        systemic disease.                       - After reviewing the risks and benefits, the patient                        was deemed in satisfactory condition to undergo the                        procedure.                       After obtaining informed consent, the colonoscope was                        passed under direct vision. Throughout the procedure,                        the patient's blood pressure, pulse, and oxygen  saturations were monitored continuously. The                        Colonoscope was introduced through the anus and                        advanced to the the cecum, identified by appendiceal                        orifice and ileocecal valve. The colonoscopy was               performed without difficulty. The patient tolerated the                        procedure well. The quality of the bowel preparation                        was excellent. The ileocecal valve, appendiceal                        orifice, and rectum were photographed. Findings:      The perianal and digital rectal examinations were normal. Pertinent       negatives include normal sphincter tone and no palpable rectal lesions.      The colon (entire examined portion) appeared normal.      The exam was otherwise without abnormality on direct and retroflexion       views. Impression:           - The entire examined colon is normal.                       - The examination was otherwise normal on direct and                        retroflexion views.                       - No specimens collected. Recommendation:       - Patient has a contact number available for                        emergencies. The signs and symptoms of potential                        delayed complications were discussed with the patient.                        Return to normal activities tomorrow. Written discharge                        instructions were provided to the patient.                       - Resume previous diet.                       - Continue present medications.                       - Repeat colonoscopy in 10 years for screening purposes.                       -  Return to GI clinic PRN.                       - The findings and recommendations were discussed with                        the patient. Procedure Code(s):    --- Professional ---                       [id number], Colorectal cancer screening; colonoscopy on                        individual not meeting criteria for high risk Diagnosis Code(s):    --- Professional ---                       Z12.11, Encounter for screening for malignant neoplasm                        of colon CPT copyright 2019 American Medical Association. All rights  reserved. The codes documented in this report are preliminary and upon coder review may  be revised to meet current compliance requirements. Efrain Sella MD, MD 01/20/2019 11:38:01 AM This report has been signed electronically. Number of Addenda: 0 Note Initiated On: 01/20/2019 11:11 AM Scope Withdrawal Time: 0 hours 6 minutes 39 seconds  Total Procedure Duration: 0 hours 10 minutes 4 seconds  Estimated Blood Loss: Estimated blood loss: none.      [facility]

## 2019-01-20 NOTE — Anesthesia Postprocedure Evaluation (Signed)
Anesthesia Post Note  Patient: Henry Boone  Procedure(s) Performed: COLONOSCOPY WITH PROPOFOL (N/A )  Patient location during evaluation: Endoscopy Anesthesia Type: General Level of consciousness: awake and alert Pain management: pain level controlled Vital Signs Assessment: post-procedure vital signs reviewed and stable Respiratory status: spontaneous breathing, nonlabored ventilation, respiratory function stable and patient connected to nasal cannula oxygen Cardiovascular status: blood pressure returned to baseline and stable Postop Assessment: no apparent nausea or vomiting Anesthetic complications: no     Last Vitals:  Vitals:   01/20/19 1149 01/20/19 1159  BP: 115/79 127/89  Pulse:    Resp:    Temp:    SpO2:      Last Pain:  Vitals:   01/20/19 1209  TempSrc:   PainSc: 0-No pain                 Martha Clan

## 2019-01-20 NOTE — Anesthesia Preprocedure Evaluation (Signed)
Anesthesia Evaluation  Patient identified by MRN, date of birth, ID band Patient awake    Reviewed: Allergy & Precautions, H&P , NPO status , Patient's Chart, lab work & pertinent test results, reviewed documented beta blocker date and time   Airway Mallampati: II   Neck ROM: full    Dental  (+) Poor Dentition   Pulmonary neg pulmonary ROS, Current Smoker and Patient abstained from smoking.,    Pulmonary exam normal        Cardiovascular Exercise Tolerance: Good hypertension, On Medications negative cardio ROS Normal cardiovascular exam Rhythm:regular Rate:Normal     Neuro/Psych PSYCHIATRIC DISORDERS CVA, No Residual Symptoms negative neurological ROS  negative psych ROS   GI/Hepatic negative GI ROS, Neg liver ROS,   Endo/Other  negative endocrine ROS  Renal/GU negative Renal ROS  negative genitourinary   Musculoskeletal   Abdominal   Peds  Hematology negative hematology ROS (+)   Anesthesia Other Findings Past Medical History: No date: ETOH abuse No date: History of cerebral artery stenosis No date: Hyperlipidemia No date: Hypertension No date: Stroke [facility]) Past Surgical History: 07/23/2017: IR ANGIO EXTRACRAN SEL COM CAROTID INNOMINATE UNI BILAT MOD  SED 11/07/2017: IR ANGIO INTRA EXTRACRAN SEL COM CAROTID INNOMINATE UNI R  MOD SED 11/07/2017: IR ANGIO VERTEBRAL SEL SUBCLAVIAN INNOMINATE UNI R MOD SED 07/23/2017: IR ANGIO VERTEBRAL SEL VERTEBRAL BILAT MOD SED 11/07/2017: RADIOLOGY WITH ANESTHESIA; N/A     Comment:  Procedure: STENTING;  Surgeon: Luanne Bras, MD;                Location: [place];  Service: Radiology;  Laterality: N/A; 11/15/2014: TEE WITHOUT CARDIOVERSION; N/A     Comment:  Procedure: TRANSESOPHAGEAL ECHOCARDIOGRAM (TEE);                Surgeon: Minna Merritts, MD;  Location: [facility] ORS;                Service: Cardiovascular;  Laterality: N/A; No date: TONSILLECTOMY BMI    Body Mass  Index: 24.11 kg/m     Reproductive/Obstetrics negative OB ROS                             Anesthesia Physical Anesthesia Plan  ASA: III  Anesthesia Plan: General   Post-op Pain Management:    Induction:   PONV Risk Score and Plan:   Airway Management Planned:   Additional Equipment:   Intra-op Plan:   Post-operative Plan:   Informed Consent: I have reviewed the patients History and Physical, chart, labs and discussed the procedure including the risks, benefits and alternatives for the proposed anesthesia with the patient or authorized representative who has indicated his/her understanding and acceptance.     Dental Advisory Given  Plan Discussed with: CRNA  Anesthesia Plan Comments:         Anesthesia Quick Evaluation

## 2019-01-20 NOTE — H&P (Signed)
Outpatient short stay form Pre-procedure 01/20/2019 11:07 AM Henry Boone, M.D.  Primary Physician: Meriel Flavors, M.D.  Reason for visit:  Colon cancer screening  History of present illness:  Patient presents for colonoscopy for colon cancer screening. The patient denies complaints of abdominal pain, significant change in bowel habits, or rectal bleeding.     Current Facility-Administered Medications:  .  0.9 %  sodium chloride infusion, , Intravenous, Continuous, [place], Benay Pike, MD, Last Rate: 20 mL/hr at 01/20/19 1057, 1,000 mL at 01/20/19 1057  Medications Prior to Admission  Medication Sig Dispense Refill Last Dose  . aspirin EC 81 MG tablet Take 81 mg by mouth daily.   Past Week at Unknown time  . buPROPion (WELLBUTRIN SR) 150 MG 12 hr tablet Take 150 mg by mouth 2 (two) times daily.   Past Week at Unknown time  . clopidogrel (PLAVIX) 75 MG tablet Take 75 mg by mouth daily.  3 Past Week at Unknown time  . lisinopril (PRINIVIL,ZESTRIL) 10 MG tablet Take 10 mg by mouth daily.  1 01/19/2019 at Unknown time  . Multiple Vitamin (MULTIVITAMIN WITH MINERALS) TABS tablet Take 1 tablet by mouth daily. 30 tablet 12 Past Week at Unknown time  . pravastatin (PRAVACHOL) 40 MG tablet Take 1 tablet (40 mg total) by mouth daily. 30 tablet 1 01/19/2019 at Unknown time     No Known Allergies   Past Medical History:  Diagnosis Date  . ETOH abuse   . History of cerebral artery stenosis   . Hyperlipidemia   . Hypertension   . Stroke [facility])     Review of systems:  Otherwise negative.    Physical Exam  Gen: Alert, oriented. Appears stated age.  HEENT: [place]/AT. PERRLA. Lungs: CTA, no wheezes. CV: RR nl S1, S2. Abd: soft, benign, no masses. BS+ Ext: No edema. Pulses 2+    Planned procedures: Proceed with colonoscopy. The patient understands the nature of the planned procedure, indications, risks, alternatives and potential complications including but not limited to bleeding,  infection, perforation, damage to internal organs and possible oversedation/side effects from anesthesia. The patient agrees and gives consent to proceed.  Please refer to procedure notes for findings, recommendations and patient disposition/instructions.     Henry Boone, M.D. Gastroenterology 01/20/2019  11:07 AM

## 2019-01-20 NOTE — Transfer of Care (Signed)
Immediate Anesthesia Transfer of Care Note  Patient: Henry Boone  Procedure(s) Performed: COLONOSCOPY WITH PROPOFOL (N/A )  Patient Location: PACU and Endoscopy Unit  Anesthesia Type:General  Level of Consciousness: drowsy  Airway & Oxygen Therapy: Patient Spontanous Breathing  Post-op Assessment: Report given to RN and Post -op Vital signs reviewed and stable  Post vital signs: Reviewed and stable  Last Vitals:  Vitals Value Taken Time  BP 103/64 01/20/19 1139  Temp 36.4 C 01/20/19 1139  Pulse 79 01/20/19 1140  Resp 24 01/20/19 1140  SpO2 98 % 01/20/19 1140  Vitals shown include unvalidated device data.  Last Pain:  Vitals:   01/20/19 1139  TempSrc: Tympanic  PainSc: Asleep         Complications: No apparent anesthesia complications

## 2019-06-24 ENCOUNTER — Encounter
Admission: RE | Admit: 2019-06-24 | Discharge: 2019-06-24 | Disposition: A | Discharge status: Home / Self Care | Payer: Managed Care, Other (non HMO) | Source: Ambulatory Visit | Attending: Otolaryngology | Admitting: Otolaryngology

## 2019-06-24 ENCOUNTER — Other Ambulatory Visit: Payer: Self-pay

## 2019-06-24 NOTE — Patient Instructions (Signed)
Your procedure is scheduled on: 06/30/19 Report to [place]. To find out your arrival time please call [phone] between 1PM - 3PM on 06/29/19.  Remember: Instructions that are not followed completely may result in serious medical risk, up to and including death, or upon the discretion of your surgeon and anesthesiologist your surgery may need to be rescheduled.     _X__ 1. Do not eat food after midnight the night before your procedure.                 No gum chewing or hard candies. You may drink clear liquids up to 2 hours                 before you are scheduled to arrive for your surgery- DO not drink clear                 liquids within 2 hours of the start of your surgery.                 Clear Liquids include:  water, apple juice without pulp, clear carbohydrate                 drink such as Clearfast or Gatorade, Black Coffee or Tea (Do not add                 anything to coffee or tea). Diabetics water only  __X__2.  On the morning of surgery brush your teeth with toothpaste and water, you                 may rinse your mouth with mouthwash if you wish.  Do not swallow any              toothpaste of mouthwash.     _X__ 3.  No Alcohol for 24 hours before or after surgery.   _X__ 4.  Do Not Smoke or use e-cigarettes For 24 Hours Prior to Your Surgery.                 Do not use any chewable tobacco products for at least 6 hours prior to                 surgery.  ____  5.  Bring all medications with you on the day of surgery if instructed.   __X__  6.  Notify your doctor if there is any change in your medical condition      (cold, fever, infections).     Do not wear jewelry, make-up, hairpins, clips or nail polish. Do not wear lotions, powders, or perfumes.  Do not shave 48 hours prior to surgery. Men may shave face and neck. Do not bring valuables to the hospital.    [facility] is not responsible for any belongings or  valuables.  Contacts, dentures/partials or body piercings may not be worn into surgery. Bring a case for your contacts, glasses or hearing aids, a denture cup will be supplied. Leave your suitcase in the car. After surgery it may be brought to your room. For patients admitted to the hospital, discharge time is determined by your treatment team.   Patients discharged the day of surgery will not be allowed to drive home.   Please read over the following fact sheets that you were given:   MRSA Information  __X__ Take these medicines the morning of surgery with A SIP OF WATER:  1. pravastatin (PRAVACHOL) 40 MG tablet  2.   3.   4.  5.  6.  ____ Fleet Enema (as directed)   __X__ Use CHG Soap/SAGE wipes as directed  ____ Use inhalers on the day of surgery  ____ Stop metformin/Janumet/Farxiga 2 days prior to surgery    ____ Take 1/2 of usual insulin dose the night before surgery. No insulin the morning          of surgery.   __X__ Stop Blood Thinners Coumadin/Plavix/Xarelto/Pleta/Pradaxa/Eliquis/Effient/Aspirin  on   Or contact your Surgeon, Cardiologist or Medical Doctor regarding  ability to stop your blood thinners  __X__ Stop Anti-inflammatories 7 days before surgery such as Advil, Ibuprofen, Motrin,  BC or Goodies Powder, Naprosyn, Naproxen, Aleve   __X__ Stop all herbal supplements, fish oil or vitamin E until after surgery.    ____ Bring C-Pap to the hospital.

## 2019-06-25 ENCOUNTER — Encounter
Admission: RE | Admit: 2019-06-25 | Discharge: 2019-06-25 | Disposition: A | Discharge status: Home / Self Care | Payer: Managed Care, Other (non HMO) | Source: Ambulatory Visit | Attending: Otolaryngology | Admitting: Otolaryngology

## 2019-06-25 DIAGNOSIS — Z8673 Personal history of transient ischemic attack (TIA), and cerebral infarction without residual deficits: Secondary | ICD-10-CM | POA: Diagnosis not present

## 2019-06-25 DIAGNOSIS — I10 Essential (primary) hypertension: Secondary | ICD-10-CM | POA: Insufficient documentation

## 2019-06-25 DIAGNOSIS — R000 Tachycardia, unspecified: Secondary | ICD-10-CM | POA: Diagnosis not present

## 2019-06-25 DIAGNOSIS — Z01818 Encounter for other preprocedural examination: Secondary | ICD-10-CM | POA: Insufficient documentation

## 2019-06-25 LAB — CBC
HCT: 46.2 % (ref 39.0–52.0)
Hemoglobin: 16.3 g/dL (ref 13.0–17.0)
MCH: 32.3 pg (ref 26.0–34.0)
MCHC: 35.3 g/dL (ref 30.0–36.0)
MCV: 91.5 fL (ref 80.0–100.0)
Platelets: 325 10*3/uL (ref 150–400)
RBC: 5.05 MIL/uL (ref 4.22–5.81)
RDW: 12.5 % (ref 11.5–15.5)
WBC: 11.3 10*3/uL — ABNORMAL HIGH (ref 4.0–10.5)
nRBC: 0 % (ref 0.0–0.2)

## 2019-06-25 LAB — BASIC METABOLIC PANEL
Anion gap: 9 (ref 5–15)
BUN: 10 mg/dL (ref 6–20)
CO2: 25 mmol/L (ref 22–32)
Calcium: 9.3 mg/dL (ref 8.9–10.3)
Chloride: 99 mmol/L (ref 98–111)
Creatinine, Ser: 0.71 mg/dL (ref 0.61–1.24)
GFR calc Af Amer: >60 mL/min (ref >60)
GFR calc non Af Amer: >60 mL/min (ref >60)
Glucose, Bld: 138 mg/dL — ABNORMAL HIGH (ref 70–99)
Potassium: 4.2 mmol/L (ref 3.5–5.1)
Sodium: 133 mmol/L — ABNORMAL LOW (ref 135–145)

## 2019-06-26 ENCOUNTER — Other Ambulatory Visit
Admission: RE | Admit: 2019-06-26 | Discharge: 2019-06-26 | Disposition: A | Discharge status: Home / Self Care | Payer: Managed Care, Other (non HMO) | Source: Ambulatory Visit | Attending: Otolaryngology | Admitting: Otolaryngology

## 2019-06-26 DIAGNOSIS — Z01812 Encounter for preprocedural laboratory examination: Secondary | ICD-10-CM | POA: Diagnosis present

## 2019-06-26 DIAGNOSIS — Z20822 Contact with and (suspected) exposure to covid-19: Secondary | ICD-10-CM | POA: Insufficient documentation

## 2019-06-26 LAB — SARS CORONAVIRUS 2 (TAT 6-24 HRS): SARS Coronavirus 2: NEGATIVE

## 2019-06-30 ENCOUNTER — Ambulatory Visit: Payer: Managed Care, Other (non HMO) | Admitting: Registered Nurse

## 2019-06-30 ENCOUNTER — Other Ambulatory Visit: Payer: Self-pay

## 2019-06-30 ENCOUNTER — Ambulatory Visit
Admission: RE | Admit: 2019-06-30 | Discharge: 2019-06-30 | Disposition: A | Discharge status: Home / Self Care | Payer: Managed Care, Other (non HMO) | Attending: Otolaryngology | Admitting: Otolaryngology

## 2019-06-30 ENCOUNTER — Encounter: Payer: Self-pay | Admitting: Otolaryngology

## 2019-06-30 ENCOUNTER — Encounter
Admission: RE | Disposition: A | Discharge status: Home / Self Care | Payer: Self-pay | Source: Home / Self Care | Attending: Otolaryngology

## 2019-06-30 DIAGNOSIS — E785 Hyperlipidemia, unspecified: Secondary | ICD-10-CM | POA: Insufficient documentation

## 2019-06-30 DIAGNOSIS — E669 Obesity, unspecified: Secondary | ICD-10-CM | POA: Insufficient documentation

## 2019-06-30 DIAGNOSIS — Z8673 Personal history of transient ischemic attack (TIA), and cerebral infarction without residual deficits: Secondary | ICD-10-CM | POA: Diagnosis not present

## 2019-06-30 DIAGNOSIS — C320 Malignant neoplasm of glottis: Secondary | ICD-10-CM | POA: Insufficient documentation

## 2019-06-30 DIAGNOSIS — Z6822 Body mass index (BMI) 22.0-22.9, adult: Secondary | ICD-10-CM | POA: Diagnosis not present

## 2019-06-30 DIAGNOSIS — Z79899 Other long term (current) drug therapy: Secondary | ICD-10-CM | POA: Diagnosis not present

## 2019-06-30 DIAGNOSIS — I10 Essential (primary) hypertension: Secondary | ICD-10-CM | POA: Insufficient documentation

## 2019-06-30 DIAGNOSIS — Z7902 Long term (current) use of antithrombotics/antiplatelets: Secondary | ICD-10-CM | POA: Insufficient documentation

## 2019-06-30 DIAGNOSIS — Z7982 Long term (current) use of aspirin: Secondary | ICD-10-CM | POA: Insufficient documentation

## 2019-06-30 HISTORY — PX: MICROLARYNGOSCOPY: SHX5208

## 2019-06-30 SURGERY — MICROLARYNGOSCOPY
Anesthesia: General

## 2019-06-30 MED ORDER — PROPOFOL 10 MG/ML IV BOLUS
INTRAVENOUS | Status: AC
  Filled 2019-06-30: qty 20

## 2019-06-30 MED ORDER — ROCURONIUM BROMIDE 10 MG/ML (PF) SYRINGE
PREFILLED_SYRINGE | INTRAVENOUS | Status: AC
  Filled 2019-06-30: qty 10

## 2019-06-30 MED ORDER — FAMOTIDINE 20 MG PO TABS
20.0000 mg | ORAL_TABLET | Freq: Once | ORAL | Status: AC
  Administered 2019-06-30: 10:00:00 20 mg via ORAL

## 2019-06-30 MED ORDER — FENTANYL CITRATE (PF) 100 MCG/2ML IJ SOLN
INTRAMUSCULAR | Status: DC | PRN
  Administered 2019-06-30 (×2): 50 ug via INTRAVENOUS

## 2019-06-30 MED ORDER — GLYCOPYRROLATE 0.2 MG/ML IJ SOLN
INTRAMUSCULAR | Status: DC | PRN
  Administered 2019-06-30: .2 mg via INTRAVENOUS

## 2019-06-30 MED ORDER — ROCURONIUM BROMIDE 100 MG/10ML IV SOLN
INTRAVENOUS | Status: DC | PRN
  Administered 2019-06-30: 50 mg via INTRAVENOUS

## 2019-06-30 MED ORDER — FENTANYL CITRATE (PF) 100 MCG/2ML IJ SOLN
INTRAMUSCULAR | Status: AC
  Filled 2019-06-30: qty 2

## 2019-06-30 MED ORDER — LACTATED RINGERS IV SOLN: INTRAVENOUS | Status: DC

## 2019-06-30 MED ORDER — ONDANSETRON HCL 4 MG/2ML IJ SOLN: 4.0000 mg | Freq: Once | INTRAMUSCULAR | Status: DC | PRN

## 2019-06-30 MED ORDER — SUGAMMADEX SODIUM 500 MG/5ML IV SOLN
INTRAVENOUS | Status: AC
  Filled 2019-06-30: qty 5

## 2019-06-30 MED ORDER — LIDOCAINE HCL 4 % EX SOLN
CUTANEOUS | Status: DC | PRN
  Administered 2019-06-30: 4 mL via TOPICAL

## 2019-06-30 MED ORDER — ONDANSETRON HCL 4 MG/2ML IJ SOLN
INTRAMUSCULAR | Status: DC | PRN
  Administered 2019-06-30: 4 mg via INTRAVENOUS

## 2019-06-30 MED ORDER — HYDROCODONE-ACETAMINOPHEN 5-325 MG PO TABS: 1.0000 | ORAL_TABLET | ORAL | 0 refills | Status: DC | PRN

## 2019-06-30 MED ORDER — MIDAZOLAM HCL 2 MG/2ML IJ SOLN
INTRAMUSCULAR | Status: DC | PRN
  Administered 2019-06-30: 2 mg via INTRAVENOUS

## 2019-06-30 MED ORDER — GLYCOPYRROLATE 0.2 MG/ML IJ SOLN
INTRAMUSCULAR | Status: AC
  Filled 2019-06-30: qty 1

## 2019-06-30 MED ORDER — ONDANSETRON HCL 4 MG PO TABS: 4.0000 mg | ORAL_TABLET | Freq: Three times a day (TID) | ORAL | 0 refills | Status: DC | PRN

## 2019-06-30 MED ORDER — PROPOFOL 10 MG/ML IV BOLUS
INTRAVENOUS | Status: DC | PRN
  Administered 2019-06-30: 180 mg via INTRAVENOUS

## 2019-06-30 MED ORDER — MIDAZOLAM HCL 2 MG/2ML IJ SOLN
INTRAMUSCULAR | Status: AC
  Filled 2019-06-30: qty 2

## 2019-06-30 MED ORDER — DEXAMETHASONE SODIUM PHOSPHATE 10 MG/ML IJ SOLN
INTRAMUSCULAR | Status: AC
  Filled 2019-06-30: qty 1

## 2019-06-30 MED ORDER — FAMOTIDINE 20 MG PO TABS
ORAL_TABLET | ORAL | Status: AC
  Filled 2019-06-30: qty 1

## 2019-06-30 MED ORDER — ONDANSETRON HCL 4 MG/2ML IJ SOLN
INTRAMUSCULAR | Status: AC
  Filled 2019-06-30: qty 2

## 2019-06-30 MED ORDER — EPINEPHRINE HCL (NASAL) 0.1 % NA SOLN
NASAL | Status: DC | PRN
  Administered 2019-06-30: 1 [drp] via TOPICAL

## 2019-06-30 MED ORDER — DEXAMETHASONE SODIUM PHOSPHATE 10 MG/ML IJ SOLN
INTRAMUSCULAR | Status: DC | PRN
  Administered 2019-06-30: 10 mg via INTRAVENOUS

## 2019-06-30 MED ORDER — LIDOCAINE HCL (CARDIAC) PF 100 MG/5ML IV SOSY
PREFILLED_SYRINGE | INTRAVENOUS | Status: DC | PRN
  Administered 2019-06-30: 100 mg via INTRAVENOUS

## 2019-06-30 MED ORDER — SUGAMMADEX SODIUM 500 MG/5ML IV SOLN
INTRAVENOUS | Status: DC | PRN
  Administered 2019-06-30: 300 mg via INTRAVENOUS

## 2019-06-30 MED ORDER — FENTANYL CITRATE (PF) 100 MCG/2ML IJ SOLN: 25.0000 ug | INTRAMUSCULAR | Status: DC | PRN

## 2019-06-30 SURGICAL SUPPLY — 17 items
COVER BACK TABLE REUSABLE LG (DRAPES) ×3 IMPLANT
COVER MAYO STAND REUSABLE (DRAPES) ×2 IMPLANT
COVER WAND RF STERILE (DRAPES) ×3 IMPLANT
CUP MEDICINE 2OZ PLAST GRAD ST (MISCELLANEOUS) ×3 IMPLANT
DRSG TELFA 4X3 1S NADH ST (GAUZE/BANDAGES/DRESSINGS) ×3 IMPLANT
GAUZE 4X4 16PLY RFD (DISPOSABLE) ×3 IMPLANT
GLOVE BIO SURGEON STRL SZ7.5 (GLOVE) ×3 IMPLANT
GOWN STRL REUS W/ TWL LRG LVL3 (GOWN DISPOSABLE) ×2 IMPLANT
GOWN STRL REUS W/TWL LRG LVL3 (GOWN DISPOSABLE) ×4
NDL SAFETY ECLIPSE 18X1.5 (NEEDLE) ×1 IMPLANT
NEEDLE HYPO 18GX1.5 SHARP (NEEDLE) ×2
PATTIES SURGICAL .5 X.5 (GAUZE/BANDAGES/DRESSINGS) ×3 IMPLANT
SOL ANTI-FOG 6CC FOG-OUT (MISCELLANEOUS) ×1 IMPLANT
SOL FOG-OUT ANTI-FOG 6CC (MISCELLANEOUS) ×2
TOWEL OR 17X26 4PK STRL BLUE (TOWEL DISPOSABLE) ×3 IMPLANT
TUBING CONNECTING 10 (TUBING) ×2 IMPLANT
TUBING CONNECTING 10' (TUBING) ×1

## 2019-06-30 NOTE — Op Note (Signed)
....  06/30/2019  11:04 AM    Henry Boone  [id number]   Pre-Op Dx:  Bilateral true vocal fold lesions  Post-op Dx: same  Proc: Suspension Microlaryngoscopy with microflap excision of right and left vocal fold lesions  Surg:  [name]  Anes:  GOT  EBL:  <6ml  Comp:  none  Findings:  Bilateral exophytic lesions that cross the anterior commissure and extend to ventral true vocal folds  Procedure: After the patient was identified in holding and the history and physical and consent was reviewed, the patient was taken to the operating room and placed in a supine position. General endotracheal anesthesia was induced in the normal fashion with a McGraff laryngoscope.    At this time, the patient was rotated 90 degrees and a shoulder roll was placed as well as well as a mouth guard.   At this time,Dedo laryngoscope was inserted into the patient's oral cavity. Visualization of the oral cavity, oropharynx, pharynx and larynx was made. This demonstrated limited view of the anterior commissure but showed bilateral vocal fold lesions extending anteriorly.  At this time the Dedo laryngoscope was removed and replaced with an Anterior commissure laryngoscope and this was suspended with suspension arm.  Using 0 and 30 degree Hopkins rod, the mass was meticulously dissected from the underlying lamina propria on the left vocal fold lesion starting with the most exophytic posterior aspect.  This was carried anteriorly but stopped prior the anterior commissure.  Once this was mostly dissected away from the underlying lamina, cup forceps were used to gently grasp the lesion and it was removed in several pieces and send for permanent pathological evaluation.  Similarly, the mass on the right posterior aspect of the vocal fold was dissected away from the lamina propria with micro scissor forceps.  Using micro-cups this lesion was removed mostly in one piece.  Similarly, care was take to avoid trauma to  the anterior commissure and very anterior vocal fold.     Epi pledgets were placed for hemostasis.    The patient's entire airway was next evaluated for hemostasis and meticulous suctioning was performed. 0.25ccs of 4% lidocaine was sprayed onto the larynx but anesthesia. The patient was released from suspension and the mouth gag and laryngoscope was removed from the patient's oral cavity without injury to teeth, lips, or gums.      Dispo:   To PACU in good condition  Plan:  Soft Diet, follow up in 1 week for repeat evaluation and review of pathology.  [name]  06/30/2019 11:04 AM

## 2019-06-30 NOTE — Anesthesia Preprocedure Evaluation (Signed)
Anesthesia Evaluation  Patient identified by MRN, date of birth, ID band Patient awake    Reviewed: Allergy & Precautions, NPO status , Patient's Chart, lab work & pertinent test results  History of Anesthesia Complications Negative for: history of anesthetic complications  Airway Mallampati: II  TM Distance: >3 FB Neck ROM: Full    Dental  (+) Poor Dentition,    Pulmonary neg sleep apnea, neg COPD, Current Smoker and Patient abstained from smoking.,    breath sounds clear to auscultation- rhonchi (-) wheezing      Cardiovascular Exercise Tolerance: Good hypertension, Pt. on medications (-) CAD, (-) Past MI, (-) Cardiac Stents and (-) CABG  Rhythm:Regular Rate:Normal - Systolic murmurs and - Diastolic murmurs    Neuro/Psych neg Seizures CVA (R hand numbness) negative psych ROS   GI/Hepatic negative GI ROS, Neg liver ROS,   Endo/Other  negative endocrine ROSneg diabetes  Renal/GU negative Renal ROS     Musculoskeletal negative musculoskeletal ROS (+)   Abdominal (+) - obese,   Peds  Hematology negative hematology ROS (+)   Anesthesia Other Findings Past Medical History: No date: ETOH abuse No date: History of cerebral artery stenosis No date: Hyperlipidemia No date: Hypertension No date: Stroke [facility])   Reproductive/Obstetrics                             Anesthesia Physical Anesthesia Plan  ASA: III  Anesthesia Plan: General   Post-op Pain Management:    Induction: Intravenous  PONV Risk Score and Plan: 0 and Ondansetron  Airway Management Planned: Oral ETT  Additional Equipment:   Intra-op Plan:   Post-operative Plan: Extubation in OR  Informed Consent: I have reviewed the patients History and Physical, chart, labs and discussed the procedure including the risks, benefits and alternatives for the proposed anesthesia with the patient or authorized representative who has  indicated his/her understanding and acceptance.     Dental advisory given  Plan Discussed with: CRNA and Anesthesiologist  Anesthesia Plan Comments:         Anesthesia Quick Evaluation

## 2019-06-30 NOTE — Transfer of Care (Signed)
Immediate Anesthesia Transfer of Care Note  Patient: Henry Boone  Procedure(s) Performed: Procedure(s): SUSPENSION MICROLARYNGOSCOPY WITH MICROFLAP (N/A)  Patient Location: PACU  Anesthesia Type:General  Level of Consciousness: sedated  Airway & Oxygen Therapy: Patient Spontanous Breathing and Patient connected to face mask oxygen  Post-op Assessment: Report given to RN and Post -op Vital signs reviewed and stable  Post vital signs: Reviewed and stable  Last Vitals:  Vitals:   06/30/19 0945 06/30/19 1115  BP: (!) 175/88 121/78  Pulse: (!) 105 85  Resp:  15  Temp:  (!) 36.2 C  SpO2:  [id number]    Complications: No apparent anesthesia complications

## 2019-06-30 NOTE — OR Nursing (Signed)
May resume aspirin in 3 days (like the plavix) per Dr. Pryor Ochoa.  Added to discharge instruction sheet under medication section.

## 2019-06-30 NOTE — Discharge Instructions (Signed)

## 2019-06-30 NOTE — Anesthesia Procedure Notes (Signed)
Procedure Name: Intubation Date/Time: 06/30/2019 10:23 AM Performed by: Doreen Salvage, CRNA Pre-anesthesia Checklist: Patient identified, Patient being monitored, Timeout performed, Emergency Drugs available and Suction available Patient Re-evaluated:Patient Re-evaluated prior to induction Oxygen Delivery Method: Circle system utilized Preoxygenation: Pre-oxygenation with 100% oxygen Induction Type: IV induction Ventilation: Mask ventilation without difficulty Laryngoscope Size: Mac, 4 and McGraph Grade View: Grade I Tube type: MLT Tube size: 6.0 mm Number of attempts: 1 Placement Confirmation: ETT inserted through vocal cords under direct vision,  positive ETCO2 and breath sounds checked- equal and bilateral Secured at: 21 cm Tube secured with: Tape Dental Injury: Teeth and Oropharynx as per pre-operative assessment  Difficulty Due To: Difficult Airway- due to anterior larynx

## 2019-06-30 NOTE — H&P (Signed)
..  History and Physical paper copy reviewed and updated date of procedure and will be scanned into system.  Patient seen and examined.  

## 2019-06-30 NOTE — Anesthesia Postprocedure Evaluation (Signed)
Anesthesia Post Note  Patient: Henry Boone  Procedure(s) Performed: SUSPENSION MICROLARYNGOSCOPY WITH MICROFLAP (N/A )  Patient location during evaluation: PACU Anesthesia Type: General Level of consciousness: awake and alert and oriented Pain management: pain level controlled Vital Signs Assessment: post-procedure vital signs reviewed and stable Respiratory status: spontaneous breathing, nonlabored ventilation and respiratory function stable Cardiovascular status: blood pressure returned to baseline and stable Postop Assessment: no signs of nausea or vomiting Anesthetic complications: no     Last Vitals:  Vitals:   06/30/19 1145 06/30/19 1155  BP: (!) 146/104 (!) 179/96  Pulse: 99 94  Resp: 15 16  Temp: (!) 36.1 C (!) 36.3 C  SpO2: 99% 99%    Last Pain:  Vitals:   06/30/19 1155  TempSrc: Temporal  PainSc: 1                  Shaylinn Hladik

## 2019-07-01 LAB — SURGICAL PATHOLOGY

## 2019-12-29 IMAGING — XA IR CAROTID  INTERNAL HEAD/NECK  RIGHT (MS)
1 series · 13 of 24 positions shown · IV contrast (IODINE)
Comparison: Catheter arteriogram of 07/23/2017.

INDICATION: History of cerebral hemispheric ischemic stroke with right-sided
hemiparesis. Discovery of high-grade right internal carotid artery
stenosis in the horizontal petrous segment.
CLINICAL DATA: Severe right internal carotid artery petrous segment
stenosis on previous arteriogram. History of ischemic cerebral
hemispheric stroke.

EXAM:
IR ANGIO INTRA EXTRACRAN SEL INTERNAL CAROTID UNI RIGHT MOD SED
TECHNIQUE: Informed written consent was obtained from the patient after a
thorough discussion of the procedural risks, benefits and
alternatives. All questions were addressed. Maximal Sterile Barrier
Technique was utilized including caps, mask, sterile gowns, sterile
gloves, sterile drape, hand hygiene and skin antiseptic. A timeout
was performed prior to the initiation of the procedure.

[Series 300: dr. (person_name) · 13 of 86 slices shown]
[im 1/86]
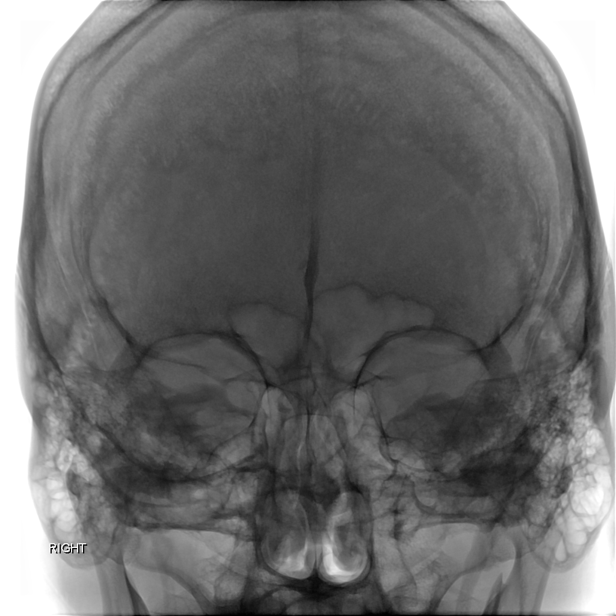
[im 8/86]
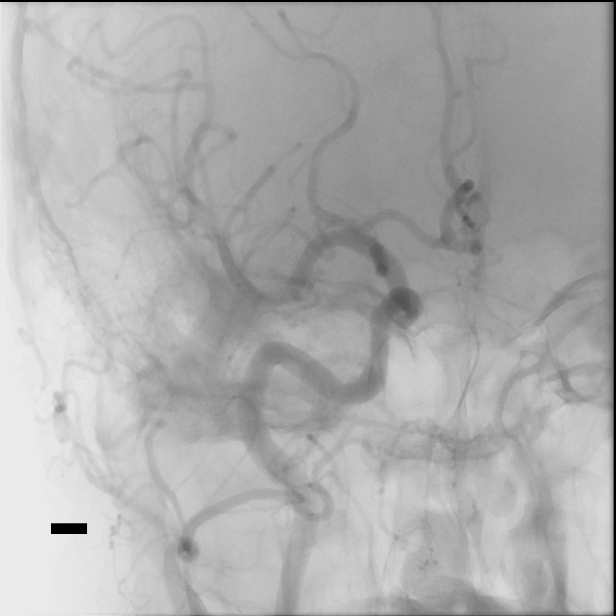
[im 15/86]
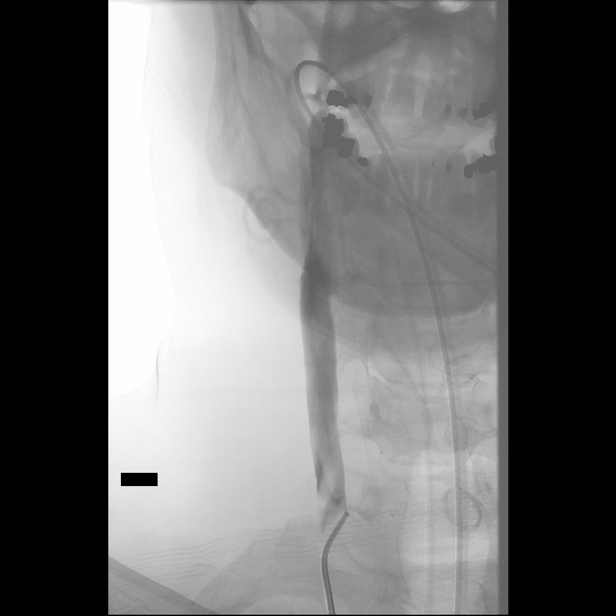
[im 23/86]
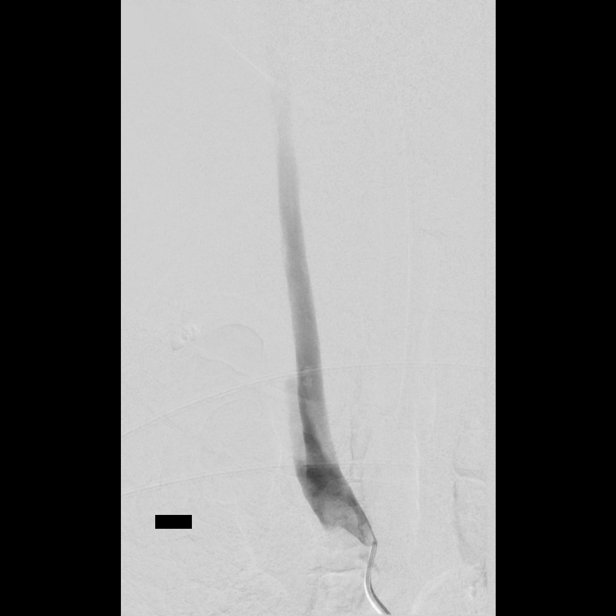
[im 30/86]
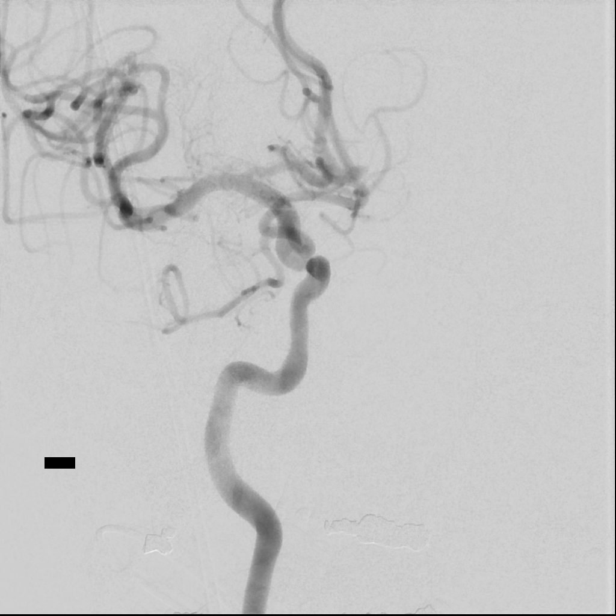
[im 37/86]
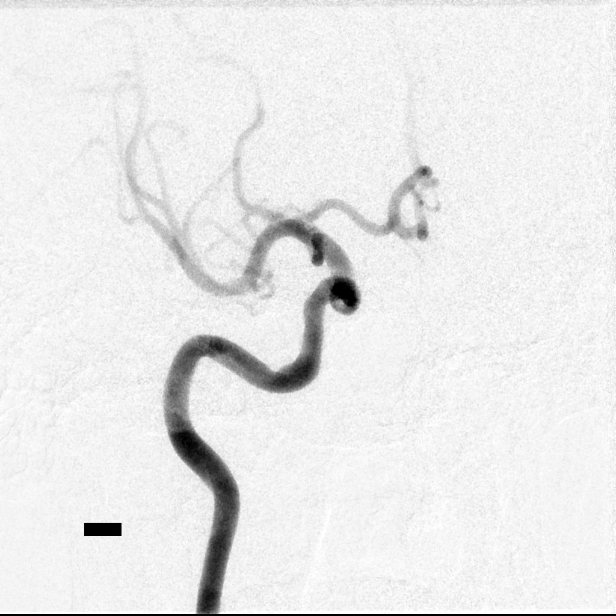
[im 45/86]
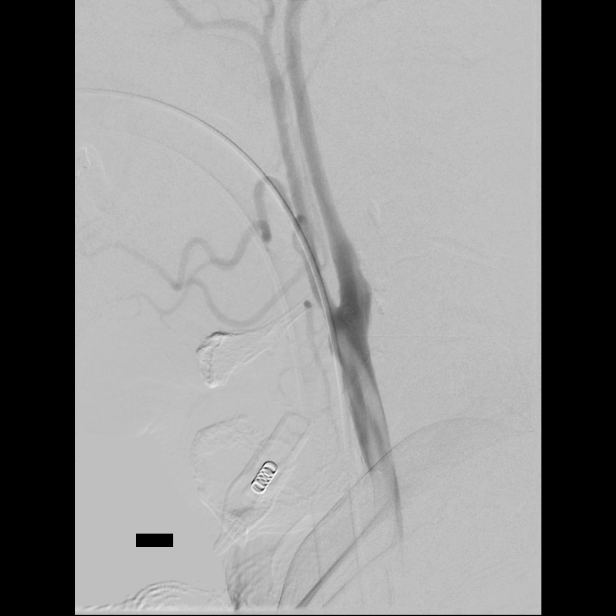
[im 49/86]
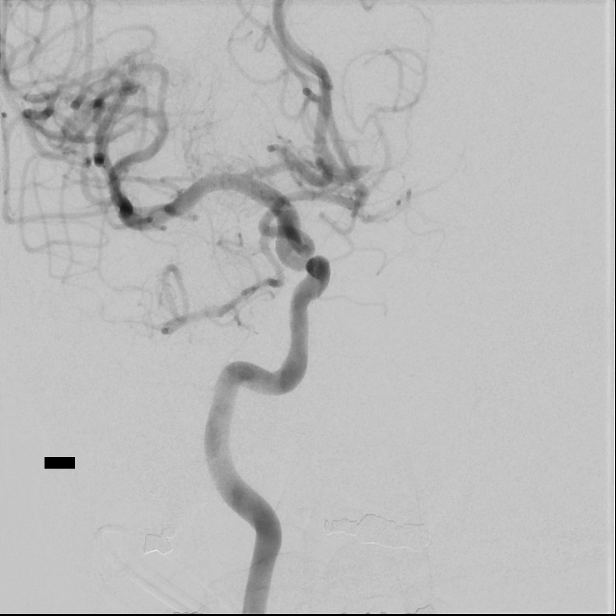
[im 56/86]
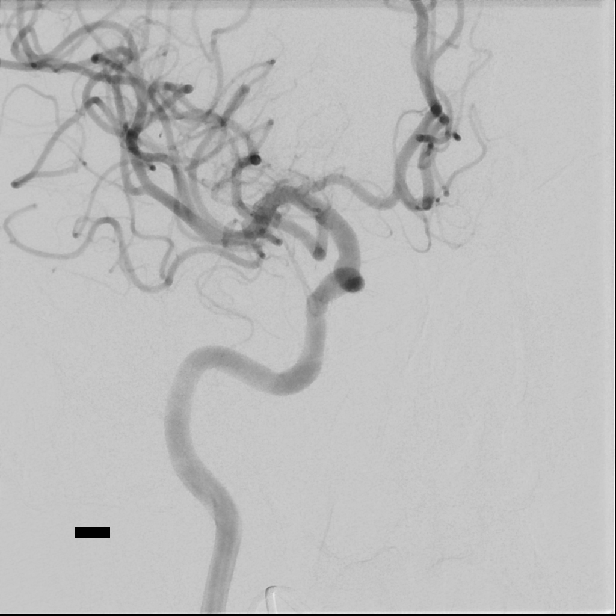
[im 63/86]
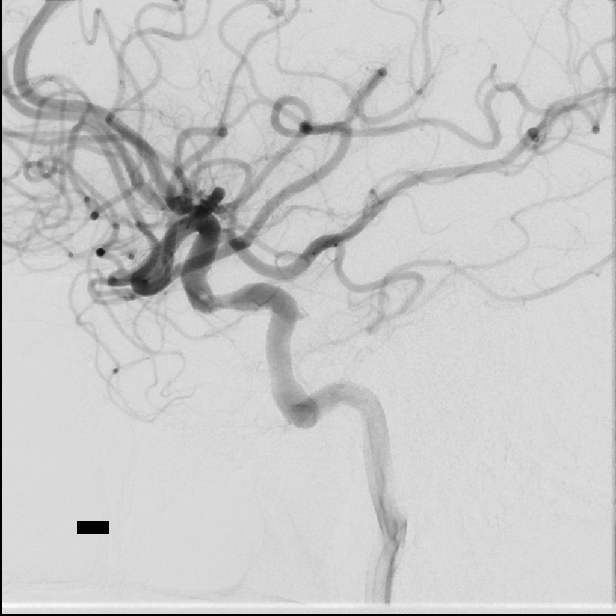
[im 71/86]
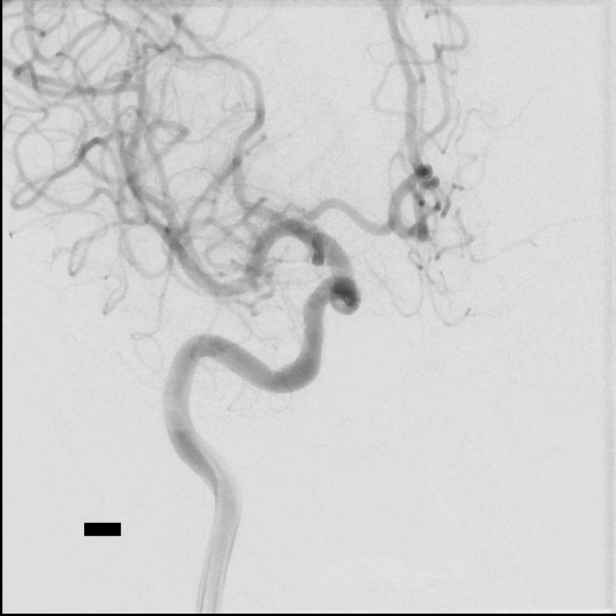
[im 78/86]
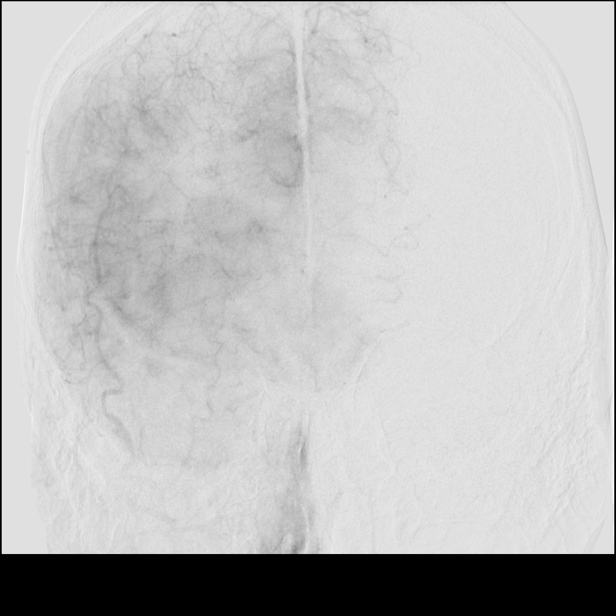
[im 86/86]
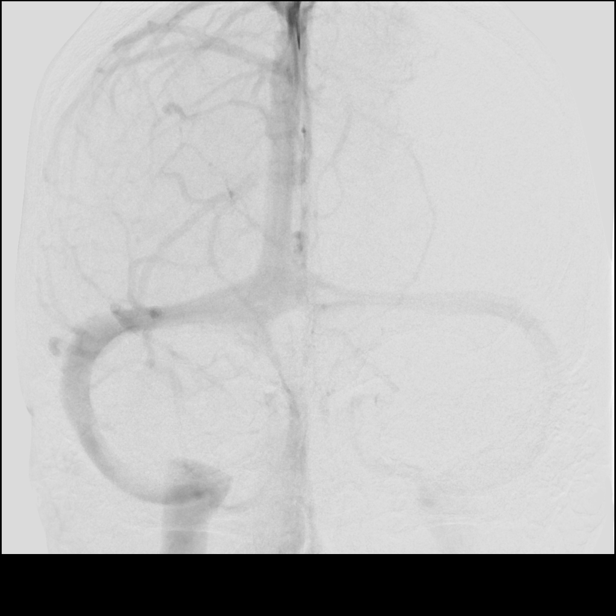

[13 of 24 positions shown; findings below may reference images not displayed]

MEDICATIONS:
Heparin 3,000 units IV; 3 g Ancef IV antibiotic was administered
within 1 hour of the procedure.

ANESTHESIA/SEDATION:
General anesthesia as per the [REDACTED] at [place][OLA].

CONTRAST:  Isovue 300 approximately 40 mL.

FLUOROSCOPY TIME:  Fluoroscopy Time: 4 minutes 0 seconds (335 mGy).

COMPLICATIONS:
None i none immediate.  Mmediate.
The right groin was prepped and draped in the usual sterile fashion.
Thereafter using modified Seldinger technique, transfemoral access
into the right common femoral artery was obtained without
difficulty. Over a 0.035 inch guidewire, a 5 French Pinnacle sheath
was inserted. Through this, and also over 0.035 inch guidewire, a 5
French JB 1 catheter was advanced to the aortic arch region and
selectively positioned in the right common carotid artery and
innominate artery.
FINDINGS: The innominate artery injection demonstrates the right subclavian
artery and the right common carotid artery to be widely patent.

The right vertebral artery origin is widely patent.

The vessel is seen to opacify to the cranial skull base where it
opacifies the right posterior-inferior cerebellar artery and the
right vertebrobasilar junction. Contrast is seen to flow into the
distal basilar artery mixing with unopacified blood from the
contralateral vertebral artery.

The right common carotid arteriogram demonstrates the right external
carotid artery and its major branches to be widely patent.

The right internal carotid artery at the bulb to the cranial skull
base also demonstrates wide patency with minimal atherosclerotic
disease involving the posterior wall of the right internal carotid
artery.

The petrous segment proximally is widely patent.

There is a tapered narrowing of approximately 55% involving the
distal cavernous segment associated with a circumferential partially
calcified plaque.

Distal to this the distal cavernous and the supraclinoid segments
are widely patent.

A right posterior communicating artery is seen to opacify the right
posterior cerebral artery distribution.

The right middle cerebral artery and the right anterior cerebral
artery opacify into the capillary and venous phases.

There is cross-filling via the anterior communicating artery of the
left anterior cerebral artery A2 segment and distally.
IMPRESSION: Approximately 55% stenosis of the proximal cavernous segment of the
right internal carotid artery associated with a partially calcified
plaque without acute ulcerations or intraluminal filling defects.

Associated mild pre stenotic dilatation.

PLAN:
Angiographic findings were reviewed with the patient and his spouse.

Given the patient has had no symptoms pertainable to the right
anterior cerebral hemisphere ischemia such as amaurosis fugax, gait
instability, speech dysarthria, left-sided numbness or weakness, and
the angiographic findings, it was decided to continue with
conservative medical management with secondary stroke preventative
measures. This was explained to the patient and his wife. A
follow-up MRI of the brain and MRA of the brain will be undertaken
in 6 months time. However, should the patient develop symptoms
between now and then, he was asked to call [phone]. Questions were
answered to their satisfaction. They both leave with good
understanding and agreement with the above management plan.

## 2023-06-11 ENCOUNTER — Emergency Department: Payer: Managed Care, Other (non HMO)

## 2023-06-11 ENCOUNTER — Inpatient Hospital Stay (HOSPITAL_COMMUNITY): Payer: Managed Care, Other (non HMO)

## 2023-06-11 ENCOUNTER — Inpatient Hospital Stay (HOSPITAL_COMMUNITY): Payer: Managed Care, Other (non HMO) | Admitting: Anesthesiology

## 2023-06-11 ENCOUNTER — Emergency Department
Admission: EM | Admit: 2023-06-11 | Discharge: 2023-06-11 | Disposition: A | Discharge status: Other Acute Inpatient Hospital | Payer: Managed Care, Other (non HMO) | Attending: Emergency Medicine | Admitting: Emergency Medicine

## 2023-06-11 ENCOUNTER — Encounter (HOSPITAL_COMMUNITY)
Admission: EM | Disposition: A | Discharge status: Home / Self Care | Payer: Self-pay | Source: Other Acute Inpatient Hospital | Attending: Neurology

## 2023-06-11 ENCOUNTER — Inpatient Hospital Stay (HOSPITAL_COMMUNITY)
Admission: EM | Admit: 2023-06-11 | Discharge: 2023-06-13 | DRG: 024 | Disposition: A | Discharge status: Home / Self Care | Payer: Managed Care, Other (non HMO) | Source: Other Acute Inpatient Hospital | Attending: Neurology | Admitting: Neurology

## 2023-06-11 ENCOUNTER — Other Ambulatory Visit: Payer: Self-pay

## 2023-06-11 ENCOUNTER — Encounter: Payer: Self-pay | Admitting: Emergency Medicine

## 2023-06-11 DIAGNOSIS — F17210 Nicotine dependence, cigarettes, uncomplicated: Secondary | ICD-10-CM | POA: Diagnosis present

## 2023-06-11 DIAGNOSIS — I63231 Cerebral infarction due to unspecified occlusion or stenosis of right carotid arteries: Principal | ICD-10-CM | POA: Diagnosis present

## 2023-06-11 DIAGNOSIS — I69398 Other sequelae of cerebral infarction: Secondary | ICD-10-CM | POA: Diagnosis not present

## 2023-06-11 DIAGNOSIS — I6521 Occlusion and stenosis of right carotid artery: Secondary | ICD-10-CM | POA: Diagnosis not present

## 2023-06-11 DIAGNOSIS — I63411 Cerebral infarction due to embolism of right middle cerebral artery: Secondary | ICD-10-CM | POA: Diagnosis present

## 2023-06-11 DIAGNOSIS — I63111 Cerebral infarction due to embolism of right vertebral artery: Secondary | ICD-10-CM | POA: Diagnosis present

## 2023-06-11 DIAGNOSIS — R1310 Dysphagia, unspecified: Secondary | ICD-10-CM | POA: Diagnosis present

## 2023-06-11 DIAGNOSIS — I10 Essential (primary) hypertension: Secondary | ICD-10-CM | POA: Diagnosis present

## 2023-06-11 DIAGNOSIS — I672 Cerebral atherosclerosis: Secondary | ICD-10-CM | POA: Diagnosis present

## 2023-06-11 DIAGNOSIS — R531 Weakness: Secondary | ICD-10-CM | POA: Diagnosis present

## 2023-06-11 DIAGNOSIS — I161 Hypertensive emergency: Secondary | ICD-10-CM | POA: Diagnosis present

## 2023-06-11 DIAGNOSIS — R29704 NIHSS score 4: Secondary | ICD-10-CM | POA: Diagnosis present

## 2023-06-11 DIAGNOSIS — G8194 Hemiplegia, unspecified affecting left nondominant side: Secondary | ICD-10-CM | POA: Diagnosis present

## 2023-06-11 DIAGNOSIS — Z79899 Other long term (current) drug therapy: Secondary | ICD-10-CM

## 2023-06-11 DIAGNOSIS — H5347 Heteronymous bilateral field defects: Secondary | ICD-10-CM | POA: Diagnosis present

## 2023-06-11 DIAGNOSIS — Z716 Tobacco abuse counseling: Secondary | ICD-10-CM

## 2023-06-11 DIAGNOSIS — Z7902 Long term (current) use of antithrombotics/antiplatelets: Secondary | ICD-10-CM | POA: Diagnosis not present

## 2023-06-11 DIAGNOSIS — R739 Hyperglycemia, unspecified: Secondary | ICD-10-CM | POA: Diagnosis present

## 2023-06-11 DIAGNOSIS — I6389 Other cerebral infarction: Secondary | ICD-10-CM | POA: Diagnosis not present

## 2023-06-11 DIAGNOSIS — I63131 Cerebral infarction due to embolism of right carotid artery: Secondary | ICD-10-CM | POA: Diagnosis present

## 2023-06-11 DIAGNOSIS — E785 Hyperlipidemia, unspecified: Secondary | ICD-10-CM | POA: Diagnosis present

## 2023-06-11 DIAGNOSIS — Z7982 Long term (current) use of aspirin: Secondary | ICD-10-CM | POA: Diagnosis not present

## 2023-06-11 DIAGNOSIS — R471 Dysarthria and anarthria: Secondary | ICD-10-CM | POA: Diagnosis present

## 2023-06-11 DIAGNOSIS — F17200 Nicotine dependence, unspecified, uncomplicated: Secondary | ICD-10-CM | POA: Diagnosis present

## 2023-06-11 DIAGNOSIS — R29810 Facial weakness: Secondary | ICD-10-CM | POA: Diagnosis present

## 2023-06-11 DIAGNOSIS — D72829 Elevated white blood cell count, unspecified: Secondary | ICD-10-CM | POA: Diagnosis present

## 2023-06-11 DIAGNOSIS — R200 Anesthesia of skin: Secondary | ICD-10-CM

## 2023-06-11 DIAGNOSIS — R29898 Other symptoms and signs involving the musculoskeletal system: Secondary | ICD-10-CM

## 2023-06-11 HISTORY — PX: IR INTRA CRAN STENT: IMG2345

## 2023-06-11 HISTORY — PX: IR PERCUTANEOUS ART THROMBECTOMY/INFUSION INTRACRANIAL INC DIAG ANGIO: IMG6087

## 2023-06-11 HISTORY — PX: IR CT HEAD LTD: IMG2386

## 2023-06-11 HISTORY — PX: RADIOLOGY WITH ANESTHESIA: SHX6223

## 2023-06-11 LAB — CBC
HCT: 47.8 % (ref 39.0–52.0)
Hemoglobin: 16.6 g/dL (ref 13.0–17.0)
MCH: 30.1 pg (ref 26.0–34.0)
MCHC: 34.7 g/dL (ref 30.0–36.0)
MCV: 86.8 fL (ref 80.0–100.0)
Platelets: 348 10*3/uL (ref 150–400)
RBC: 5.51 MIL/uL (ref 4.22–5.81)
RDW: 13.1 % (ref 11.5–15.5)
WBC: 12.4 10*3/uL — ABNORMAL HIGH (ref 4.0–10.5)
nRBC: 0 % (ref 0.0–0.2)

## 2023-06-11 LAB — COMPREHENSIVE METABOLIC PANEL
ALT: 10 U/L (ref 0–44)
AST: 17 U/L (ref 15–41)
Albumin: 4.2 g/dL (ref 3.5–5.0)
Alkaline Phosphatase: 60 U/L (ref 38–126)
Anion gap: 10 (ref 5–15)
BUN: 7 mg/dL (ref 6–20)
CO2: 23 mmol/L (ref 22–32)
Calcium: 9.3 mg/dL (ref 8.9–10.3)
Chloride: 105 mmol/L (ref 98–111)
Creatinine, Ser: 0.75 mg/dL (ref 0.61–1.24)
GFR, Estimated: >60 mL/min (ref >60)
Glucose, Bld: 132 mg/dL — ABNORMAL HIGH (ref 70–99)
Potassium: 3.8 mmol/L (ref 3.5–5.1)
Sodium: 138 mmol/L (ref 135–145)
Total Bilirubin: 0.5 mg/dL (ref 0.0–1.2)
Total Protein: 7.2 g/dL (ref 6.5–8.1)

## 2023-06-11 LAB — DIFFERENTIAL
Abs Immature Granulocytes: 0.06 10*3/uL (ref 0.00–0.07)
Basophils Absolute: 0.1 10*3/uL (ref 0.0–0.1)
Basophils Relative: 0 %
Eosinophils Absolute: 0.1 10*3/uL (ref 0.0–0.5)
Eosinophils Relative: 1 %
Immature Granulocytes: 1 %
Lymphocytes Relative: 13 %
Lymphs Abs: 1.7 10*3/uL (ref 0.7–4.0)
Monocytes Absolute: 0.7 10*3/uL (ref 0.1–1.0)
Monocytes Relative: 6 %
Neutro Abs: 9.8 10*3/uL — ABNORMAL HIGH (ref 1.7–7.7)
Neutrophils Relative %: 79 %

## 2023-06-11 LAB — PROTIME-INR
INR: 1 (ref 0.8–1.2)
Prothrombin Time: 13.1 s (ref 11.4–15.2)

## 2023-06-11 LAB — ECHOCARDIOGRAM COMPLETE
Height: 72 in
S' Lateral: 3.4 cm
Weight: 2320 [oz_av]

## 2023-06-11 LAB — GLUCOSE, CAPILLARY
Glucose-Capillary: 119 mg/dL — ABNORMAL HIGH (ref 70–99)
Glucose-Capillary: 155 mg/dL — ABNORMAL HIGH (ref 70–99)
Glucose-Capillary: 167 mg/dL — ABNORMAL HIGH (ref 70–99)

## 2023-06-11 LAB — RESP PANEL BY RT-PCR (RSV, FLU A&B, COVID)  RVPGX2
Influenza A by PCR: NEGATIVE
Influenza B by PCR: NEGATIVE
Resp Syncytial Virus by PCR: NEGATIVE
SARS Coronavirus 2 by RT PCR: NEGATIVE

## 2023-06-11 LAB — APTT: aPTT: 31 s (ref 24–36)

## 2023-06-11 LAB — CBG MONITORING, ED: Glucose-Capillary: 124 mg/dL — ABNORMAL HIGH (ref 70–99)

## 2023-06-11 LAB — ETHANOL: Alcohol, Ethyl (B): <10 mg/dL (ref <10)

## 2023-06-11 LAB — HIV ANTIBODY (ROUTINE TESTING W REFLEX): HIV Screen 4th Generation wRfx: NONREACTIVE

## 2023-06-11 LAB — MRSA NEXT GEN BY PCR, NASAL: MRSA by PCR Next Gen: NOT DETECTED

## 2023-06-11 SURGERY — IR WITH ANESTHESIA
Anesthesia: General

## 2023-06-11 MED ORDER — ACETAMINOPHEN 650 MG RE SUPP: 650.0000 mg | RECTAL | Status: DC | PRN

## 2023-06-11 MED ORDER — CLEVIDIPINE BUTYRATE 0.5 MG/ML IV EMUL
0.0000 mg/h | INTRAVENOUS | Status: DC
  Administered 2023-06-12: 2 mg/h via INTRAVENOUS
  Filled 2023-06-11 (×2): qty 100

## 2023-06-11 MED ORDER — INSULIN ASPART 100 UNIT/ML IJ SOLN
0.0000 [IU] | INTRAMUSCULAR | Status: DC
  Administered 2023-06-11 – 2023-06-12 (×3): 2 [IU] via SUBCUTANEOUS
  Administered 2023-06-12: 1 [IU] via SUBCUTANEOUS
  Administered 2023-06-13: 2 [IU] via SUBCUTANEOUS

## 2023-06-11 MED ORDER — IOHEXOL 300 MG/ML  SOLN
150.0000 mL | Freq: Once | INTRAMUSCULAR | Status: AC | PRN
  Administered 2023-06-11: 60 mL via INTRA_ARTERIAL

## 2023-06-11 MED ORDER — CLEVIDIPINE BUTYRATE 0.5 MG/ML IV EMUL
INTRAVENOUS | Status: AC
  Filled 2023-06-11: qty 50

## 2023-06-11 MED ORDER — ASPIRIN 81 MG PO CHEW
CHEWABLE_TABLET | ORAL | Status: AC
  Filled 2023-06-11: qty 1

## 2023-06-11 MED ORDER — SUCCINYLCHOLINE CHLORIDE 200 MG/10ML IV SOSY
PREFILLED_SYRINGE | INTRAVENOUS | Status: DC | PRN
  Administered 2023-06-11: 60 mg via INTRAVENOUS

## 2023-06-11 MED ORDER — NITROGLYCERIN 1 MG/10 ML FOR IR/CATH LAB
INTRA_ARTERIAL | Status: AC
  Filled 2023-06-11: qty 10

## 2023-06-11 MED ORDER — SODIUM CHLORIDE 0.9 % IV SOLN
2.0000 ug/kg/min | INTRAVENOUS | Status: AC
  Filled 2023-06-11 (×2): qty 50

## 2023-06-11 MED ORDER — PROPOFOL 10 MG/ML IV BOLUS
INTRAVENOUS | Status: DC | PRN
  Administered 2023-06-11: 50 mg via INTRAVENOUS
  Administered 2023-06-11: 30 mg via INTRAVENOUS
  Administered 2023-06-11: 20 mg via INTRAVENOUS
  Administered 2023-06-11: 30 mg via INTRAVENOUS
  Administered 2023-06-11: 150 mg via INTRAVENOUS

## 2023-06-11 MED ORDER — ASPIRIN 81 MG PO CHEW: 81.0000 mg | CHEWABLE_TABLET | Freq: Every day | ORAL | Status: DC

## 2023-06-11 MED ORDER — PHENYLEPHRINE HCL (PRESSORS) 10 MG/ML IV SOLN
INTRAVENOUS | Status: DC | PRN
  Administered 2023-06-11 (×2): 80 ug via INTRAVENOUS

## 2023-06-11 MED ORDER — OXYCODONE HCL 5 MG PO TABS: 5.0000 mg | ORAL_TABLET | Freq: Once | ORAL | Status: DC | PRN

## 2023-06-11 MED ORDER — CHLORHEXIDINE GLUCONATE CLOTH 2 % EX PADS
6.0000 | MEDICATED_PAD | Freq: Every day | CUTANEOUS | Status: DC
Start: 2023-06-12 — End: 2023-06-13
  Administered 2023-06-12 – 2023-06-13 (×2): 6 via TOPICAL

## 2023-06-11 MED ORDER — ALBUMIN HUMAN 5 % IV SOLN: INTRAVENOUS | Status: DC | PRN

## 2023-06-11 MED ORDER — ACETAMINOPHEN 325 MG PO TABS: 650.0000 mg | ORAL_TABLET | ORAL | Status: DC | PRN

## 2023-06-11 MED ORDER — ACETAMINOPHEN 160 MG/5ML PO SOLN: 650.0000 mg | ORAL | Status: DC | PRN

## 2023-06-11 MED ORDER — LIDOCAINE HCL (CARDIAC) PF 100 MG/5ML IV SOSY
PREFILLED_SYRINGE | INTRAVENOUS | Status: DC | PRN
Start: 2023-06-11 — End: 2023-06-11
  Administered 2023-06-11: 60 mg via INTRATRACHEAL

## 2023-06-11 MED ORDER — PHENYLEPHRINE HCL-NACL 20-0.9 MG/250ML-% IV SOLN
INTRAVENOUS | Status: DC | PRN
  Administered 2023-06-11: 10 ug/min via INTRAVENOUS

## 2023-06-11 MED ORDER — CLOPIDOGREL BISULFATE 75 MG PO TABS
ORAL_TABLET | ORAL | Status: AC
  Filled 2023-06-11: qty 1

## 2023-06-11 MED ORDER — FENTANYL CITRATE (PF) 100 MCG/2ML IJ SOLN
INTRAMUSCULAR | Status: AC
  Filled 2023-06-11: qty 2

## 2023-06-11 MED ORDER — SODIUM CHLORIDE 0.9 % IV SOLN
INTRAVENOUS | Status: AC | PRN
  Administered 2023-06-11: 2 ug/kg/min via INTRAVENOUS

## 2023-06-11 MED ORDER — SUGAMMADEX SODIUM 200 MG/2ML IV SOLN
INTRAVENOUS | Status: DC | PRN
  Administered 2023-06-11: 200 mg via INTRAVENOUS

## 2023-06-11 MED ORDER — PANTOPRAZOLE SODIUM 40 MG IV SOLR
40.0000 mg | Freq: Every day | INTRAVENOUS | Status: DC
  Administered 2023-06-11: 40 mg via INTRAVENOUS
  Filled 2023-06-11: qty 10

## 2023-06-11 MED ORDER — OXYCODONE HCL 5 MG/5ML PO SOLN: 5.0000 mg | Freq: Once | ORAL | Status: DC | PRN

## 2023-06-11 MED ORDER — CLEVIDIPINE BUTYRATE 0.5 MG/ML IV EMUL
INTRAVENOUS | Status: DC | PRN
  Administered 2023-06-11: 1 mg/h via INTRAVENOUS

## 2023-06-11 MED ORDER — CLOPIDOGREL BISULFATE 75 MG PO TABS: 75.0000 mg | ORAL_TABLET | Freq: Every day | ORAL | Status: DC

## 2023-06-11 MED ORDER — SODIUM CHLORIDE 0.9% FLUSH
3.0000 mL | Freq: Once | INTRAVENOUS | Status: AC
  Administered 2023-06-11: 3 mL via INTRAVENOUS

## 2023-06-11 MED ORDER — SODIUM CHLORIDE 0.9 % IV SOLN: INTRAVENOUS | Status: DC

## 2023-06-11 MED ORDER — AMISULPRIDE (ANTIEMETIC) 5 MG/2ML IV SOLN: 10.0000 mg | Freq: Once | INTRAVENOUS | Status: DC | PRN

## 2023-06-11 MED ORDER — ASPIRIN 81 MG PO CHEW
81.0000 mg | CHEWABLE_TABLET | Freq: Every day | ORAL | Status: DC
  Administered 2023-06-12 – 2023-06-13 (×2): 81 mg via ORAL
  Filled 2023-06-11 (×2): qty 1

## 2023-06-11 MED ORDER — CANGRELOR BOLUS VIA INFUSION
INTRAVENOUS | Status: AC | PRN
  Administered 2023-06-11: 987 ug via INTRAVENOUS

## 2023-06-11 MED ORDER — ASPIRIN 325 MG PO TABS
ORAL_TABLET | ORAL | Status: AC | PRN
  Administered 2023-06-11: 81 mg via ORAL

## 2023-06-11 MED ORDER — CANGRELOR TETRASODIUM 50 MG IV SOLR
INTRAVENOUS | Status: AC
  Filled 2023-06-11: qty 50

## 2023-06-11 MED ORDER — FENTANYL CITRATE (PF) 100 MCG/2ML IJ SOLN
INTRAMUSCULAR | Status: DC | PRN
  Administered 2023-06-11: 100 ug via INTRAVENOUS

## 2023-06-11 MED ORDER — ACETAMINOPHEN 10 MG/ML IV SOLN: 1000.0000 mg | Freq: Once | INTRAVENOUS | Status: DC | PRN

## 2023-06-11 MED ORDER — STROKE: EARLY STAGES OF RECOVERY BOOK
Freq: Once | Status: AC
  Filled 2023-06-11: qty 1

## 2023-06-11 MED ORDER — CLOPIDOGREL BISULFATE 75 MG PO TABS
75.0000 mg | ORAL_TABLET | Freq: Every day | ORAL | Status: DC
  Administered 2023-06-12: 75 mg via ORAL
  Filled 2023-06-11: qty 1

## 2023-06-11 MED ORDER — CLOPIDOGREL BISULFATE 75 MG PO TABS
ORAL_TABLET | ORAL | Status: AC | PRN
  Administered 2023-06-11: 75 mg via ORAL

## 2023-06-11 MED ORDER — AMISULPRIDE (ANTIEMETIC) 5 MG/2ML IV SOLN
INTRAVENOUS | Status: AC
  Filled 2023-06-11: qty 4

## 2023-06-11 MED ORDER — ORAL CARE MOUTH RINSE: 15.0000 mL | OROMUCOSAL | Status: DC | PRN

## 2023-06-11 MED ORDER — FENTANYL CITRATE (PF) 100 MCG/2ML IJ SOLN: 25.0000 ug | INTRAMUSCULAR | Status: DC | PRN

## 2023-06-11 MED ORDER — LACTATED RINGERS IV SOLN: INTRAVENOUS | Status: DC | PRN

## 2023-06-11 MED ORDER — ROCURONIUM BROMIDE 100 MG/10ML IV SOLN
INTRAVENOUS | Status: DC | PRN
  Administered 2023-06-11 (×2): 30 mg via INTRAVENOUS
  Administered 2023-06-11: 10 mg via INTRAVENOUS

## 2023-06-11 MED ORDER — IOHEXOL 350 MG/ML SOLN
100.0000 mL | Freq: Once | INTRAVENOUS | Status: AC | PRN
  Administered 2023-06-11: 100 mL via INTRAVENOUS

## 2023-06-11 MED ORDER — SENNOSIDES-DOCUSATE SODIUM 8.6-50 MG PO TABS: 1.0000 | ORAL_TABLET | Freq: Every evening | ORAL | Status: DC | PRN

## 2023-06-11 NOTE — ED Provider Notes (Signed)
 [facility] Provider Note    Event Date/Time   First MD Initiated Contact with Patient 06/11/23 [phone]     (approximate)   History   Stroke Symptoms   HPI Henry Boone is a 58 y.o. male with prior history of CVA, HTN, HLD presenting today for weakness.  Patient states going to bed last night around 9 PM feeling well.  He woke up early this morning around 4:30 AM feeling weakness and numbness to his left side.  He states having a fall due to the symptoms.  Did not hit his head.  He states weakness and paresthesia to left arm and slight paresthesia to left leg still present.  He denied any speech changes.  No numbness or facial droop present on the face.  States he does feel similar to prior stroke episodes.  Chart review: Patient with most recent CVA in 2019 requiring IR angiography.     Physical Exam   Triage Vital Signs: ED Triage Vitals  Encounter Vitals Group     BP 06/11/23 0744 (!) 160/115     Systolic BP Percentile --      Diastolic BP Percentile --      Pulse Rate 06/11/23 0744 (!) 114     Resp 06/11/23 0744 18     Temp 06/11/23 0747 (!) 97.5 F (36.4 C)     Temp Source 06/11/23 0747 Oral     SpO2 06/11/23 0744 99 %     Weight 06/11/23 0744 145 lb (65.8 kg)     Height 06/11/23 0744 6' (1.829 m)     Head Circumference --      Peak Flow --      Pain Score 06/11/23 0744 0     Pain Loc --      Pain Education --      Exclude from Growth Chart --     Most recent vital signs: Vitals:   06/11/23 0744 06/11/23 0747  BP: (!) 160/115   Pulse: (!) 114   Resp: 18   Temp:  (!) 97.5 F (36.4 C)  SpO2: 99%    I have reviewed the vital signs. General:  Awake, alert, no acute distress. Head:  Normocephalic, Atraumatic. EENT:  PERRL, EOMI, Oral mucosa pink and moist, Neck is supple. Cardiovascular: Regular rate, 2+ distal pulses. Respiratory:  Normal respiratory effort, symmetrical expansion, no distress.   Extremities:  Moving all four  extremities through full ROM without pain.   Neuro:  Alert and oriented.  Interacting appropriately.  Peripheral vision disturbances in the left upper and lower visual fields and unable to see my fingers.  Intact right sided visual fields.  Left arm drift but does not hit the bed.  No drift in right arm.  Paresthesia to left arm but not complete numbness.  Normal sensation to right upper extremity.  Slight paresthesia to left lower extremity with sensation normal throughout right lower extremity.  No drift to either leg.  Cranial nerves II through XII otherwise intact aside from peripheral visual disturbance.  No aphasia, dysarthria.  No obvious unilateral neglect. Skin:  Warm, dry, no rash.   Psych: Appropriate affect.    ED Results / Procedures / Treatments   Labs (all labs ordered are listed, but only abnormal results are displayed) Labs Reviewed  CBC - Abnormal; Notable for the following components:      Result Value   WBC 12.4 (*)    All other components within normal limits  DIFFERENTIAL -  Abnormal; Notable for the following components:   Neutro Abs 9.8 (*)    All other components within normal limits  COMPREHENSIVE METABOLIC PANEL - Abnormal; Notable for the following components:   Glucose, Bld 132 (*)    All other components within normal limits  CBG MONITORING, ED - Abnormal; Notable for the following components:   Glucose-Capillary 124 (*)    All other components within normal limits  RESP PANEL BY RT-PCR (RSV, FLU A&B, COVID)  RVPGX2  PROTIME-INR  APTT  ETHANOL  I-STAT CREATININE, ED     EKG My EKG: Rate of 113, sinus tachycardia with 1 PVC.  Right axis deviation.  No acute ST elevations or depressions   RADIOLOGY Independently interpreted CT head with no acute pathology.  Independently interpreted CTA head and neck with evidence of right-sided carotid artery occlusion   PROCEDURES:  Critical Care performed: Yes, see critical care procedure note(s)  .Critical  Care  Performed by: Janith Lima, MD Authorized by: Janith Lima, MD   Critical care provider statement:    Critical care time (minutes):  30   Critical care was necessary to treat or prevent imminent or life-threatening deterioration of the following conditions: stroke with left sided deficits, visual changes.   Critical care was time spent personally by me on the following activities:  Development of treatment plan with patient or surrogate, discussions with consultants, evaluation of patient's response to treatment, examination of patient, ordering and review of laboratory studies, ordering and review of radiographic studies, ordering and performing treatments and interventions, pulse oximetry, re-evaluation of patient's condition and review of old charts   I assumed direction of critical care for this patient from another provider in my specialty: no     Care discussed with: admitting provider      MEDICATIONS ORDERED IN ED: Medications  sodium chloride flush (NS) 0.9 % injection 3 mL (3 mLs Intravenous Given 06/11/23 0844)  iohexol (OMNIPAQUE) 350 MG/ML injection 100 mL (100 mLs Intravenous Contrast Given 06/11/23 0820)     IMPRESSION / MDM / ASSESSMENT AND PLAN / ED COURSE  I reviewed the triage vital signs and the nursing notes.                              Differential diagnosis includes, but is not limited to, new CVA, carotid stenosis, recrudescence of prior CVA, dehydration  Patient's presentation is most consistent with acute presentation with potential threat to life or bodily function.  Patient is a 58 year old male presenting today for left-sided paresthesias and weakness.  On exam he has drift to the left upper extremity along with paresthesia and compared to the right side.  He is also having visual disturbances on the left-sided peripheral vision which is not present on the right side and is new for him.  Last known normal was at 9 PM last night, however he is meeting  van criteria and will activate stroke alert. NIHSS of 3.  Patient outside the window for tPA.  CT head negative.  CTA head and neck shows evidence of right sided cervical carotid artery occlusion.  Neurology reached out to IR at [facility] for immediate transfer and intervention.  IR neurology at [facility] have agreed to accept patient.  Patient transferred emergently to [facility] for further intervention.  The patient is on the cardiac monitor to evaluate for evidence of arrhythmia and/or significant heart rate changes. Clinical Course as of  06/11/23 1610  Tue Jun 11, 2023  0757 Patient assessed at the bedside.  He has left-sided arm drift which has not completely hit the bed which is new for him.  He is also having visual deficits to the left sided peripheral vision.  Last known normal at 9 PM yesterday and is meeting Zenaida Niece positive criteria at this time.  Will call stroke alert to evaluate for possible LVO [DW]  [phone] Patient with evidence of right cervical carotid occlusion. 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   Janith Lima, MD 06/11/23 [phone]

## 2023-06-11 NOTE — Transfer of Care (Signed)
 Immediate Anesthesia Transfer of Care Note  Patient: Henry Boone  Procedure(s) Performed: IR WITH ANESTHESIA  Patient Location: PACU  Anesthesia Type:General  Level of Consciousness: awake, alert , and oriented  Airway & Oxygen Therapy: Patient Spontanous Breathing  Post-op Assessment: Report given to RN and Post -op Vital signs reviewed and stable  Post vital signs: Reviewed and stable  Last Vitals:  Vitals Value Taken Time  BP 126/64 06/11/23 1300  Temp    Pulse 91 06/11/23 1313  Resp 14 06/11/23 1313  SpO2 96 % 06/11/23 1313  Vitals shown include unfiled device data.  Last Pain:  Vitals:   06/11/23 1300  PainSc: 0-No pain         Complications: No notable events documented.

## 2023-06-11 NOTE — Anesthesia Procedure Notes (Addendum)
 Procedure Name: Intubation Date/Time: 06/11/2023 10:29 AM  Performed by: Camillia Herter, CRNAPre-anesthesia Checklist: Patient identified, Emergency Drugs available, Suction available and Patient being monitored Patient Re-evaluated:Patient Re-evaluated prior to induction Oxygen Delivery Method: Circle system utilized Preoxygenation: Pre-oxygenation with 100% oxygen Induction Type: IV induction Ventilation: Mask ventilation without difficulty Laryngoscope Size: Glidescope and 4 Grade View: Grade I Tube type: Oral Tube size: 7.0 mm Number of attempts: 1 Airway Equipment and Method: Stylet, Oral airway and Video-laryngoscopy Placement Confirmation: ETT inserted through vocal cords under direct vision, positive ETCO2 and breath sounds checked- equal and bilateral Secured at: 22 cm Tube secured with: Tape Dental Injury: Teeth and Oropharynx as per pre-operative assessment  Comments: Intubation by Bradley Ferris, MD

## 2023-06-11 NOTE — Plan of Care (Signed)
 Briefly assessed in PACU post-op. Stable left sided weakness, right leg appropriately imbolized, good right arm antigrav strength, following commands, normal casual speech Improved left hemianopia now only left lower quadrantanopia. No gaze preference.   Complaining of full bladder, unable to void  PRN bladder scan and in and out cath ordered   Brooke Dare MD-PhD [facility] [phone]  No charge note

## 2023-06-11 NOTE — ED Notes (Signed)
 Called [employer] with rep. Greggory Stallion gave rep pt's LKW and vitals rep. called in the code stroke.

## 2023-06-11 NOTE — ED Triage Notes (Signed)
 Patient to ED via POV for left side weakness/numbness. LWK yesterday when going to bed at 2150. Pt woke up at 0445 this AM and fell going to the bathroom due to the numbness. Denies injuries from the fall. On blood thinners. Hx of stroke

## 2023-06-11 NOTE — Procedures (Addendum)
 INR  Status post right common carotid arteriogram.  Right CFA approach .   Findings.    Occluded distal cavernous ICA segment of the right internal carotid artery.  Status post complete revascularization of the occluded right internal carotid artery intracranial portion with 1 pass with a 4 mm x 40 mm Solitaire X retriever device  and 071 contact  aspiration achieving a TICI 2C .revascularization.  2 underlying intracranial arteriosclerosis related stenosis and revascularization with stent assisted angioplasty using a 3 mm x 15 mm frontier Onyx balloon minimal stent.  Final right middle cerebral artery revascularization score of TICI  2C.  Final CT of the brain demonstrates no evidence of intracranial hemorrhage or mass effect.  Manual compression with quick clot  held at the right groin puncture site to due to underlying atherosclerosis  for 25 minutes.  Distal pulses all present unchanged from prior to procedure.  Medications given half bolus dose of IV cangrelor followed by a half dose infusion over 4 hours.  CT of the brain to be obtained when cangrelor stopped at 4 hours  Patient extubated.   Denies any H/A or nausea. Follow simple commands appropriately.  Moves all fours  nearly equally..  Pupils 2 to 3 mm sluggishly reactive bilaterally.  No facial asymmetry tongue midline.  Fatima Sanger MD.

## 2023-06-11 NOTE — H&P (Addendum)
 NEUROLOGY CONSULT NOTE   Date of service: June 11, 2023 Patient Name: Henry Boone MRN:  [id number] DOB:  February 02, 1966 Chief Complaint: "Left-sided weakness" Requesting Provider: Gordy Councilman, MD  History of Present Illness  Henry Boone is a 58 y.o. 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  Went to bed normal at 2100 and awoke this morning with acute new left-sided weakness and numbness as well as worsening of his baseline visual deficit.  NIH at [facility] was 10, see Dr. Darlyn Read notes for details  Transferred to [facility] for right carotid revascularization.  On arrival NIH improved to 4 on my examination.  Of note significantly no longer had neglect, weakness was much improved, and facial droop had resolved as had aphasia and dysarthria.  He still had sensory symptoms but this was unreliable with him reporting reduced sensation in the left leg and right arm.  He still had a left hemianopia.  Given his old stroke there was consideration that he may have had new onset focal seizure with resolving Todd's paralysis, so I elected to proceed with MRI for decision making.  Of note information about his history of left carotid occlusion was not known to me at this time but found later on full chart review  LKW: 11 Modified rankin score: 2-Slight disability-UNABLE to perform all activities but does not need assistance IV Thrombolysis: No, out of the window EVT: Yes  MRI obtained due to improving NIH, to distinguish acute carotid occlusion vs. chronic carotid w/ focal seizure from prior stroke and post-ictal Todd's  NIHSS components Score: Comment  1a Level of Conscious 0[x]  1[]  2[]  3[]      1b LOC Questions 0[x]  1[]  2[]       1c LOC Commands 0[x]  1[]  2[]       2 Best Gaze 0[x]  1[]  2[]       3 Visual 0[]  1[]  2[x]  3[]      4 Facial Palsy 0[x]  1[]  2[]  3[]      5a Motor Arm - left 0[]   1[x]  2[]  3[]  4[]  UN[]    5b Motor Arm - Right 0[x]  1[]  2[]  3[]  4[]  UN[]    6a Motor Leg - Left 0[x]  1[]  2[]  3[]  4[]  UN[]    6b Motor Leg - Right 0[x]  1[]  2[]  3[]  4[]  UN[]    7 Limb Ataxia 0[x]  1[]  2[]  3[]  UN[]     8 Sensory 0[]  1[x]  2[]  UN[]      9 Best Language 0[x]  1[]  2[]  3[]      10 Dysarthria 0[x]  1[]  2[]  UN[]      11 Extinct. and Inattention 0[x]  1[]  2[]       TOTAL:  4      ROS  Limited in emergent setting  Past History   Past Medical History:  Diagnosis Date   ETOH abuse    History of cerebral artery stenosis    Hyperlipidemia    Hypertension    Stroke [facility])     Past Surgical History:  Procedure Laterality Date   COLONOSCOPY WITH PROPOFOL N/A 01/20/2019   Procedure: COLONOSCOPY WITH PROPOFOL;  Surgeon: Toledo, Boykin Nearing, MD;  Location: [facility] ENDOSCOPY;  Service: Gastroenterology;  Laterality: N/A;   IR ANGIO EXTRACRAN SEL COM CAROTID INNOMINATE UNI BILAT MOD SED  07/23/2017   IR ANGIO INTRA EXTRACRAN SEL COM CAROTID INNOMINATE UNI R MOD SED  11/07/2017   IR ANGIO VERTEBRAL SEL SUBCLAVIAN INNOMINATE UNI R MOD SED  11/07/2017  IR ANGIO VERTEBRAL SEL VERTEBRAL BILAT MOD SED  07/23/2017   MICROLARYNGOSCOPY N/A 06/30/2019   Procedure: SUSPENSION MICROLARYNGOSCOPY WITH MICROFLAP;  Surgeon: Bud Face, MD;  Location: [facility] ORS;  Service: ENT;  Laterality: N/A;   RADIOLOGY WITH ANESTHESIA N/A 11/07/2017   Procedure: STENTING;  Surgeon: Julieanne Cotton, MD;  Location: [facility] OR;  Service: Radiology;  Laterality: N/A;   TEE WITHOUT CARDIOVERSION N/A 11/15/2014   Procedure: TRANSESOPHAGEAL ECHOCARDIOGRAM (TEE);  Surgeon: Antonieta Iba, MD;  Location: [facility] ORS;  Service: Cardiovascular;  Laterality: N/A;   TONSILLECTOMY      Family History: No family history on file.  Social History  reports that he has been smoking cigarettes. He has never used smokeless tobacco. He reports that he does not currently use alcohol. He reports that he does not use drugs.  No Known  Allergies  Medications  No current facility-administered medications for this encounter.  Vitals  There were no vitals filed for this visit.  There is no height or weight on file to calculate BMI.  Physical Exam   Constitutional: Appears well-developed and well-nourished.  Psych: Affect appropriate to situation, calm, cooperative Eyes: No scleral injection.  HENT: No OP obstruction.  Head: Normocephalic.  Respiratory: Effort normal, non-labored breathing     Neurologic Examination   See NIH stroke scale as documented above Of note significantly no longer had neglect, weakness was much improved (minimal drift of the left upper extremity but there was pronation and finger curling, no drift of the left leg), and facial droop had resolved as had aphasia and dysarthria.  He still had sensory symptoms but this was unreliable with him reporting reduced sensation in the left leg and right arm compared to the contralateral sides.  He still had a left hemianopia.  Labs/Imaging/Neurodiagnostic studies   CBC:  Recent Labs  Lab 06/17/23 0746  WBC 12.4*  NEUTROABS 9.8*  HGB 16.6  HCT 47.8  MCV 86.8  PLT 348   Basic Metabolic Panel:  Lab Results  Component Value Date   NA 138 2023/06/17   K 3.8 06/17/23   CO2 23 June 17, 2023   GLUCOSE 132 (H) 06-17-23   BUN 7 2023-06-17   CREATININE 0.75 Jun 17, 2023   CALCIUM 9.3 2023-06-17   GFRNONAA >60 2023/06/17   GFRAA >60 06/25/2019   Lipid Panel:  Lab Results  Component Value Date   LDLCALC 56 07/24/2017   HgbA1c:  Lab Results  Component Value Date   HGBA1C 5.5 07/24/2017   Urine Drug Screen: No results found for: "LABOPIA", "COCAINSCRNUR", "LABBENZ", "AMPHETMU", "THCU", "LABBARB"  Alcohol Level     Component Value Date/Time   ETH <10 17-Jun-2023 0746   INR  Lab Results  Component Value Date   INR 1.0 06/17/23   APTT  Lab Results  Component Value Date   APTT 31 June 17, 2023   AED levels: No results found for:  "PHENYTOIN", "ZONISAMIDE", "LAMOTRIGINE", "LEVETIRACETA"   CT Head without contrast(Personally reviewed): 1. Age-indeterminate tiny anterior right frontal cortex infarct. 2. Large chronic infarcts in the right parietal and occipital cortex. Small chronic infarcts scattered along the left cerebral convexity. 3. No intracranial hemorrhage.   CT angio Head and Neck with contrast(Personally reviewed): 1. Emergent large vessel occlusion at the right cervical carotid. There is intracranial reconstitution at the ophthalmic segment. The right ICA feeds the bilateral anterior circulation in the setting of chronic left cavernous carotid occlusion and the right PCA in the setting of fetal type PCA circulation on the right. Ischemic perfusion  is seen in the bilateral MCA territory. 15 cc area of infarct reported in the right cerebral hemisphere, although overestimated given tagging of a chronic parietal infarct. 2. Persistent trigeminal artery on the left. 3. Chronic high-grade narrowing at the right vertebral origin due to calcified plaque. 4. Advanced narrowing of the left subclavian after the vertebral origin with irregular plaque. 5. Note that the arch and great vessel ostia are not covered. 6. 50% narrowing of the distal cervical ICA on the left, progressed from 2019. 7. Emphysema with suspicious 14 mm nodule at the left upper lobe, most concerning for tumor, recommend chest CT after convalescence.   07/23/2017 Angiographically occluded left internal carotid artery in the distal petrous segment, with distal reconstitution of the cavernous segment and the supraclinoid segments with robust retrograde flow via the ophthalmic artery, from the external carotid artery branches in the orbital ethmoid region.  Suggestion of occlusion of a M3 perisylvian branch of the superior division of the left middle cerebral artery.  Approximately 80-85% stenosis of the right internal carotid artery in  the proximal cavernous segment.  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   ASSESSMENT   AHAD COLARUSSO is a 58 y.o. male presenting with acute right carotid occlusion now undergoing intervention  RECOMMENDATIONS  # Right MCA territory stroke secondary to right carotid occlusion - Appreciate IR for carotid revascularization - Antiplatelet agents and blood pressure goal per IR - Stroke labs HgbA1c, fasting lipid panel - Postprocedure MRI brain to be ordered by stroke team - Frequent neuro checks per protocol - Echocardiogram - SCDs for DVT prophylaxis until cleared by neuro IR/stroke team - Risk factor modification - Telemetry monitoring - Blood pressure goal   - Post successful uncomplicated revascularization SBP 120 - 160 for 24 hours unless otherwise directed by neuro IR team - PT consult, OT consult, Speech consult, unless patient is back to baseline - Stroke team admission   #14 mm nodule at the left upper lobe, - Chest CT w/o contrast   # Hyperglycemia - Q4 glucose checks, LDSSI    ______________________________________________________________________   Brooke Dare MD-PhD [facility] [phone]  CRITICAL CARE Performed by: Gordy Councilman   Total critical care time: 60 minutes  Critical care time was exclusive of separately billable procedures and treating other patients.  Critical care was necessary to treat or prevent imminent or life-threatening deterioration.  Critical care was time spent personally by me on the following activities: development of treatment plan with patient and/or surrogate as well as nursing, discussions with consultants, evaluation of patient's response to treatment, examination of patient, obtaining history from patient or surrogate, ordering and performing treatments and interventions, ordering and review of  laboratory studies, ordering and review of radiographic studies, pulse oximetry and re-evaluation of patient's condition.

## 2023-06-11 NOTE — Code Documentation (Addendum)
 Pt arrived [facility] via [facility] at [phone]. Pt examined and taken to IR suite 0936. Pt's NIH has improved to 4-6, minimal weakness on left side. Lt hemianopia remains. IR case postponed, pt taken to STAT MRI for treatment decision information.    Per Dr. Iver Nestle, based on MRI imaging, pt is having an acute stroke, Code IR updated "back on" at 10:10. Pt arrived to IR suite also at 10:10.

## 2023-06-11 NOTE — Progress Notes (Addendum)
 Elerted @ [phone], pt in CT with RN at bedside reports left side neglect, vision changes and left side weakness LKW 2100 VAN + per EDP  Stack at bedside in CT 0805, page sent at [phone] [id number] Tresa Endo RN at bedside and states per Selina Cooley MD no TNK, pt moved back to ER stretcher  mRS 0   Georgana Curio, Telestroke RN

## 2023-06-11 NOTE — Progress Notes (Signed)
 Interventionalist and neurology consulted at bedside due to decreased NIH and decided for further imaging. Code stroke deactivated at this time until further conclusive evidence.

## 2023-06-11 NOTE — ED Notes (Signed)
 EMTALA reviewed by charge RN

## 2023-06-11 NOTE — Progress Notes (Signed)
 CODE STROKE- PHARMACY COMMUNICATION  Time CODE STROKE called/page received: 2/25 @ 0800  Time response to CODE STROKE was made (in person or via phone): in-person  Time Stroke Kit retrieved from [employer] (only if needed): NA, outside of window  Name of Provider/Nurse contacted: Dr. Selina Cooley  Past Medical History:  Diagnosis Date   ETOH abuse    History of cerebral artery stenosis    Hyperlipidemia    Hypertension    Stroke [facility])    Prior to Admission medications   Medication Sig Start Date End Date Taking? Authorizing Provider  aspirin EC 81 MG tablet Take 81 mg by mouth daily.    [provider]  clopidogrel (PLAVIX) 75 MG tablet Take 75 mg by mouth daily. 09/23/17   [provider]  hydrochlorothiazide (HYDRODIURIL) 25 MG tablet Take 12.5 mg by mouth daily.  05/15/19   [provider]  HYDROcodone-acetaminophen (NORCO) 5-325 MG tablet Take 1 tablet by mouth every 4 (four) hours as needed for moderate pain. 06/30/19   Vaught, Roney Mans, MD  lisinopril (ZESTRIL) 20 MG tablet Take 20 mg by mouth daily.  06/30/17   [provider]  Multiple Vitamin (MULTIVITAMIN WITH MINERALS) TABS tablet Take 1 tablet by mouth daily. 11/15/14   Gale Journey, MD  ondansetron (ZOFRAN) 4 MG tablet Take 1 tablet (4 mg total) by mouth every 8 (eight) hours as needed for up to 10 doses for nausea or vomiting. 06/30/19   Bud Face, MD  pravastatin (PRAVACHOL) 40 MG tablet Take 1 tablet (40 mg total) by mouth daily. 11/15/14   Gale Journey, MD   Effie Shy, PharmD Pharmacy Resident  06/11/2023 8:20 AM

## 2023-06-11 NOTE — Sedation Documentation (Signed)
 Patient under care of anesthesia. See CRNA flowsheets.

## 2023-06-11 NOTE — Anesthesia Postprocedure Evaluation (Deleted)
 Anesthesia Post Note  Patient: Henry Boone  Procedure(s) Performed: IR WITH ANESTHESIA     Patient location during evaluation: PACU Anesthesia Type: General Level of consciousness: sedated and patient cooperative Pain management: pain level controlled Vital Signs Assessment: post-procedure vital signs reviewed and stable Respiratory status: spontaneous breathing Cardiovascular status: stable Anesthetic complications: no   No notable events documented.  Last Vitals:  Vitals:   06/11/23 1254 06/11/23 1300  BP: 113/85 126/64  Pulse: 92 94  Resp: 20 14  Temp: 37.3 C   SpO2: 100% 97%    Last Pain:  Vitals:   06/11/23 1300  PainSc: 0-No pain                 [name]

## 2023-06-11 NOTE — Anesthesia Preprocedure Evaluation (Addendum)
 Anesthesia Evaluation  Patient identified by MRN, date of birth, ID band Patient awake  Preop documentation limited or incomplete due to emergent nature of procedure.  Airway Mallampati: II       Dental no notable dental hx.    Pulmonary Current Smoker and Patient abstained from smoking.   Pulmonary exam normal        Cardiovascular hypertension, Pt. on medications Normal cardiovascular exam     Neuro/Psych CVA    GI/Hepatic   Endo/Other    Renal/GU      Musculoskeletal   Abdominal   Peds  Hematology  (+) Blood dyscrasia (Plavix)   Anesthesia Other Findings code stroke  Reproductive/Obstetrics                             Anesthesia Physical Anesthesia Plan  ASA: 3 and emergent  Anesthesia Plan: General   Post-op Pain Management:    Induction: Intravenous  PONV Risk Score and Plan: 1 and Ondansetron, Dexamethasone and Treatment may vary due to age or medical condition  Airway Management Planned: Oral ETT and Video Laryngoscope Planned  Additional Equipment: Arterial line  Intra-op Plan:   Post-operative Plan: Possible Post-op intubation/ventilation  Informed Consent:      Only emergency history available  Plan Discussed with: CRNA  Anesthesia Plan Comments:        Anesthesia Quick Evaluation

## 2023-06-11 NOTE — Code Documentation (Signed)
 Stroke Response Nurse Documentation Code Documentation  Henry Boone is a 58 y.o. male arriving to [facility] via [employer] on 06/11/2023 with past medical hx of stroke with previous left sided visual deficits On aspirin 81 mg daily and clopidogrel 75 mg daily. Code stroke was activated by ED.   Patient from home where he was LKW at 2100 2/24 and now complaining of new left sided weakness, numbness. Patient went to be normal at 2100 last night. He had a fall around 0445-0500, but went back to bed. Symptoms persisted and he came to the ED for evaluation.   Stroke team at the bedside on patient arrival. Labs drawn and patient cleared for CT by Dr. Anner Crete. Patient to CT with team. NIHSS 9, see documentation for details and code stroke times. Patient with left hemianopia, left facial droop, left arm weakness, left leg weakness, left decreased sensation, Expressive aphasia , and dysarthria  on exam. The following imaging was completed:  CT Head and CTA. Patient is not a candidate for IV Thrombolytic due to outside window, per MD. Patient is a candidate for IR due to LVO, per MD.   Care Plan: emergent transfer to [facility] for IR. Covid swab sent. Belongings with significant other.   Bedside handoff with ED RN Yevette Edwards. Consuella Lose, [facility] SRN given report by this RN.    Wille Glaser  Stroke Response RN

## 2023-06-11 NOTE — Consult Note (Signed)
 NEUROLOGY CONSULT NOTE   Date of service: June 11, 2023 Patient Name: Henry Boone MRN:  [id number] DOB:  1966/01/05 Chief Complaint: L sided weakness and numbness. Requesting Provider: Janith Lima, MD  History of Present Illness   Henry Boone is a 58 y.o. male with hx of R parietal and occipital ischemic infarct who presents with acute onset L sided weakness and numbness upon awakening this AM. LKW 2100 before he went to bed. NIHSS = 10 including L sided weakness and numbness and L homonymous hemianopsia. Patient states L sided weakness and numbness are new this morning. Visual deficit is old. CT head showed age indeterminate infarct R frontal convexity, no ICH. Large chronic infarcts are noted in the right parietal and occipital cortex. TNK was not administered 2/2 presentation outside the window. CTA/CTP showed R ICA occlusion with penumbra. Risks, benefits, and alternatives to mechanical thrombectomy were discussed and patient gave informed consent to proceed.  LKW: 2100 Modified rankin score: 2-Slight disability-UNABLE to perform all activities but does not need assistance IV Thrombolysis: no, outside window EVT: Yes  NIHSS components Score: Comment  1a Level of Conscious 0[x]  1[]  2[]  3[]      1b LOC Questions 0[x]  1[]  2[]       1c LOC Commands 0[x]  1[]  2[]       2 Best Gaze 0[x]  1[]  2[]       3 Visual 0[]  1[]  2[x]  3[]      4 Facial Palsy 0[]  1[x]  2[]  3[]      5a Motor Arm - left 0[]  1[]  2[x]  3[]  4[]  UN[]    5b Motor Arm - Right 0[x]  1[]  2[]  3[]  4[]  UN[]    6a Motor Leg - Left 0[]  1[x]  2[]  3[]  4[]  UN[]    6b Motor Leg - Right 0[x]  1[]  2[]  3[]  4[]  UN[]    7 Limb Ataxia 0[x]  1[]  2[]  3[]  UN[]     8 Sensory 0[]  1[x]  2[]  UN[]      9 Best Language 0[]  1[x]  2[]  3[]      10 Dysarthria 0[]  1[x]  2[]  UN[]      11 Extinct. and Inattention 0[]  1[x]  2[]       TOTAL:  10      ROS   Comprehensive ROS performed and pertinent positives documented in HPI    Past History   Past Medical History:   Diagnosis Date   ETOH abuse    History of cerebral artery stenosis    Hyperlipidemia    Hypertension    Stroke [facility])     Past Surgical History:  Procedure Laterality Date   COLONOSCOPY WITH PROPOFOL N/A 01/20/2019   Procedure: COLONOSCOPY WITH PROPOFOL;  Surgeon: Toledo, Boykin Nearing, MD;  Location: [facility] ENDOSCOPY;  Service: Gastroenterology;  Laterality: N/A;   IR ANGIO EXTRACRAN SEL COM CAROTID INNOMINATE UNI BILAT MOD SED  07/23/2017   IR ANGIO INTRA EXTRACRAN SEL COM CAROTID INNOMINATE UNI R MOD SED  11/07/2017   IR ANGIO VERTEBRAL SEL SUBCLAVIAN INNOMINATE UNI R MOD SED  11/07/2017   IR ANGIO VERTEBRAL SEL VERTEBRAL BILAT MOD SED  07/23/2017   MICROLARYNGOSCOPY N/A 06/30/2019   Procedure: SUSPENSION MICROLARYNGOSCOPY WITH MICROFLAP;  Surgeon: Bud Face, MD;  Location: [facility] ORS;  Service: ENT;  Laterality: N/A;   RADIOLOGY WITH ANESTHESIA N/A 11/07/2017   Procedure: STENTING;  Surgeon: Julieanne Cotton, MD;  Location: [facility] OR;  Service: Radiology;  Laterality: N/A;   TEE WITHOUT CARDIOVERSION N/A 11/15/2014   Procedure: TRANSESOPHAGEAL ECHOCARDIOGRAM (TEE);  Surgeon: Antonieta Iba, MD;  Location: [facility] ORS;  Service: Cardiovascular;  Laterality: N/A;   TONSILLECTOMY      Family History: History reviewed. No pertinent family history.  Social History  reports that he has been smoking cigarettes. He has never used smokeless tobacco. He reports that he does not currently use alcohol. He reports that he does not use drugs.  No Known Allergies  Medications   Current Facility-Administered Medications:    sodium chloride flush (NS) 0.9 % injection 3 mL, 3 mL, Intravenous, Once, Janith Lima, MD  Current Outpatient Medications:    aspirin EC 81 MG tablet, Take 81 mg by mouth daily., Disp: , Rfl:    clopidogrel (PLAVIX) 75 MG tablet, Take 75 mg by mouth daily., Disp: , Rfl: 3   hydrochlorothiazide (HYDRODIURIL) 25 MG tablet, Take 12.5 mg by mouth daily. , Disp: , Rfl:     HYDROcodone-acetaminophen (NORCO) 5-325 MG tablet, Take 1 tablet by mouth every 4 (four) hours as needed for moderate pain., Disp: 30 tablet, Rfl: 0   lisinopril (ZESTRIL) 20 MG tablet, Take 20 mg by mouth daily. , Disp: , Rfl: 1   Multiple Vitamin (MULTIVITAMIN WITH MINERALS) TABS tablet, Take 1 tablet by mouth daily., Disp: 30 tablet, Rfl: 12   ondansetron (ZOFRAN) 4 MG tablet, Take 1 tablet (4 mg total) by mouth every 8 (eight) hours as needed for up to 10 doses for nausea or vomiting., Disp: 20 tablet, Rfl: 0   pravastatin (PRAVACHOL) 40 MG tablet, Take 1 tablet (40 mg total) by mouth daily., Disp: 30 tablet, Rfl: 1  Vitals   Vitals:   06/11/23 0744 06/11/23 0747  BP: (!) 160/115   Pulse: (!) 114   Resp: 18   Temp:  (!) 97.5 F (36.4 C)  TempSrc:  Oral  SpO2: 99%   Weight: 65.8 kg   Height: 6' (1.829 m)     Body mass index is 19.67 kg/m.  Physical Exam   Gen: patient lying in bed, NAD CV: extremities appear well-perfused Resp: normal WOB  Neurologic Examination   MS: alert, oriented x4, follows commands Speech: mild dysarthria, mild aphasia, able to name 4 of 6 objects CN: PERRL, L homonymous hemianopsia, EOMI, sensation intact, L NLF flattening, hearing intact to voice Motor: No drift R side, drift to bed LUE, drift but not to bed LLE Sensory: Sensory impairment LUE Reflexes: 1+ symm with toes down bilat Coordination: FNF intact on R Gait: deferred   Labs/Imaging/Neurodiagnostic studies   CBC: No results for input(s): "WBC", "NEUTROABS", "HGB", "HCT", "MCV", "PLT" in the last 168 hours. Basic Metabolic Panel:  Lab Results  Component Value Date   NA 133 (L) 06/25/2019   K 4.2 06/25/2019   CO2 25 06/25/2019   GLUCOSE 138 (H) 06/25/2019   BUN 10 06/25/2019   CREATININE 0.71 06/25/2019   CALCIUM 9.3 06/25/2019   GFRNONAA >60 06/25/2019   GFRAA >60 06/25/2019   Lipid Panel:  Lab Results  Component Value Date   LDLCALC 56 07/24/2017   HgbA1c:  Lab Results   Component Value Date   HGBA1C 5.5 07/24/2017   Urine Drug Screen: No results found for: "LABOPIA", "COCAINSCRNUR", "LABBENZ", "AMPHETMU", "THCU", "LABBARB"  Alcohol Level     Component Value Date/Time   ETH <10 07/23/2017 0726   INR  Lab Results  Component Value Date   INR 0.98 11/04/2017   APTT  Lab Results  Component Value Date   APTT 30 07/23/2017   AED levels: No results found for: "PHENYTOIN", "ZONISAMIDE", "LAMOTRIGINE", "LEVETIRACETA"  CT Head without contrast(Personally  reviewed): 1. Age-indeterminate tiny anterior right frontal cortex infarct. 2. Large chronic infarcts in the right parietal and occipital cortex. Small chronic infarcts scattered along the left cerebral convexity. 3. No intracranial hemorrhage.  CT angio Head and Neck with contrast(Personally reviewed): R ICA occlusion with penumbra   ASSESSMENT   GONSALO CUTHBERTSON is a 58 y.o. male with hx of R parietal and occipital ischemic infarct who presents with acute onset L sided weakness and numbness upon awakening this AM. LKW 2100. NIHSS = 10. TNK was not administered 2/2 presentation outside the window. CTA/CTP showed R ICA occlusion with penumbra. Patient gave informed consent to proceed with mechanical thrombectomy.  RECOMMENDATIONS   - STAT transfer to [facility] for CODE IR for mechanical thrombectomy - Further mgmt per [facility] IR and admitting neuro teams  This patient is critically ill and at significant risk of neurological worsening, death and care requires constant monitoring of vital signs, hemodynamics,respiratory and cardiac monitoring, neurological assessment, discussion with family, other specialists and medical decision making of high complexity. I spent 65 minutes of neurocritical care time  in the care of  this patient. This was time spent independent of any time provided by nurse practitioner or PA.  Bing Neighbors, MD [facility] [phone]  If 7pm- 7am, please page neurology on  call as listed in AMION.

## 2023-06-11 NOTE — Anesthesia Procedure Notes (Signed)
 Arterial Line Insertion Start/End2/25/2025 10:32 AM Performed by: Camillia Herter, CRNA, CRNA  Patient location: Pre-op. Preanesthetic checklist: patient identified, IV checked, site marked, risks and benefits discussed, surgical consent, monitors and equipment checked, pre-op evaluation, timeout performed and anesthesia consent Lidocaine 1% used for infiltration Left, radial was placed Catheter size: 20 G Hand hygiene performed , maximum sterile barriers used  and Seldinger technique used  Attempts: 1 Procedure performed without using ultrasound guided technique. Following insertion, dressing applied and Biopatch. Post procedure assessment: normal and unchanged  Patient tolerated the procedure well with no immediate complications.

## 2023-06-11 NOTE — Progress Notes (Signed)
 Echocardiogram 2D Echocardiogram has been performed.  Warren Lacy Kandon Hosking RDCS 06/11/2023, 3:01 PM

## 2023-06-12 ENCOUNTER — Telehealth (HOSPITAL_COMMUNITY): Payer: Self-pay | Admitting: Pharmacy Technician

## 2023-06-12 ENCOUNTER — Encounter (HOSPITAL_COMMUNITY): Payer: Self-pay | Admitting: Radiology

## 2023-06-12 ENCOUNTER — Other Ambulatory Visit (HOSPITAL_COMMUNITY): Payer: Self-pay

## 2023-06-12 DIAGNOSIS — I63231 Cerebral infarction due to unspecified occlusion or stenosis of right carotid arteries: Secondary | ICD-10-CM | POA: Diagnosis not present

## 2023-06-12 LAB — BASIC METABOLIC PANEL
Anion gap: 11 (ref 5–15)
BUN: 7 mg/dL (ref 6–20)
CO2: 24 mmol/L (ref 22–32)
Calcium: 9.3 mg/dL (ref 8.9–10.3)
Chloride: 104 mmol/L (ref 98–111)
Creatinine, Ser: 0.78 mg/dL (ref 0.61–1.24)
GFR, Estimated: >60 mL/min (ref >60)
Glucose, Bld: 104 mg/dL — ABNORMAL HIGH (ref 70–99)
Potassium: 3.9 mmol/L (ref 3.5–5.1)
Sodium: 139 mmol/L (ref 135–145)

## 2023-06-12 LAB — ANTITHROMBIN III: AntiThromb III Func: 107 % (ref 75–120)

## 2023-06-12 LAB — CBC WITH DIFFERENTIAL/PLATELET
Abs Immature Granulocytes: 0.08 10*3/uL — ABNORMAL HIGH (ref 0.00–0.07)
Basophils Absolute: 0.1 10*3/uL (ref 0.0–0.1)
Basophils Relative: 0 %
Eosinophils Absolute: 0 10*3/uL (ref 0.0–0.5)
Eosinophils Relative: 0 %
HCT: 45.7 % (ref 39.0–52.0)
Hemoglobin: 15.9 g/dL (ref 13.0–17.0)
Immature Granulocytes: 1 %
Lymphocytes Relative: 18 %
Lymphs Abs: 2.8 10*3/uL (ref 0.7–4.0)
MCH: 30.3 pg (ref 26.0–34.0)
MCHC: 34.8 g/dL (ref 30.0–36.0)
MCV: 87 fL (ref 80.0–100.0)
Monocytes Absolute: 1.3 10*3/uL — ABNORMAL HIGH (ref 0.1–1.0)
Monocytes Relative: 8 %
Neutro Abs: 11.2 10*3/uL — ABNORMAL HIGH (ref 1.7–7.7)
Neutrophils Relative %: 73 %
Platelets: 316 10*3/uL (ref 150–400)
RBC: 5.25 MIL/uL (ref 4.22–5.81)
RDW: 13.2 % (ref 11.5–15.5)
WBC: 15.4 10*3/uL — ABNORMAL HIGH (ref 4.0–10.5)
nRBC: 0 % (ref 0.0–0.2)

## 2023-06-12 LAB — GLUCOSE, CAPILLARY
Glucose-Capillary: 114 mg/dL — ABNORMAL HIGH (ref 70–99)
Glucose-Capillary: 100 mg/dL — ABNORMAL HIGH (ref 70–99)
Glucose-Capillary: 113 mg/dL — ABNORMAL HIGH (ref 70–99)
Glucose-Capillary: 146 mg/dL — ABNORMAL HIGH (ref 70–99)
Glucose-Capillary: 190 mg/dL — ABNORMAL HIGH (ref 70–99)

## 2023-06-12 LAB — LIPID PANEL
Cholesterol: 132 mg/dL (ref 0–200)
HDL: 38 mg/dL — ABNORMAL LOW (ref >40)
LDL Cholesterol: 68 mg/dL (ref 0–99)
Total CHOL/HDL Ratio: 3.5 ratio
Triglycerides: 131 mg/dL (ref <150)
VLDL: 26 mg/dL (ref 0–40)

## 2023-06-12 LAB — HEMOGLOBIN A1C
Hgb A1c MFr Bld: 5.7 % — ABNORMAL HIGH (ref 4.8–5.6)
Mean Plasma Glucose: 116.89 mg/dL

## 2023-06-12 MED ORDER — AMLODIPINE BESYLATE 5 MG PO TABS
5.0000 mg | ORAL_TABLET | Freq: Every day | ORAL | Status: DC
  Administered 2023-06-12 – 2023-06-13 (×2): 5 mg via ORAL
  Filled 2023-06-12 (×2): qty 1

## 2023-06-12 MED ORDER — ENOXAPARIN SODIUM 40 MG/0.4ML IJ SOSY
40.0000 mg | PREFILLED_SYRINGE | Freq: Every day | INTRAMUSCULAR | Status: DC
  Administered 2023-06-12: 40 mg via SUBCUTANEOUS
  Filled 2023-06-12: qty 0.4

## 2023-06-12 MED ORDER — PANTOPRAZOLE SODIUM 40 MG PO TBEC
40.0000 mg | DELAYED_RELEASE_TABLET | Freq: Every day | ORAL | Status: DC
  Administered 2023-06-12: 40 mg via ORAL
  Filled 2023-06-12: qty 1

## 2023-06-12 MED ORDER — NICOTINE 21 MG/24HR TD PT24
21.0000 mg | MEDICATED_PATCH | Freq: Every day | TRANSDERMAL | Status: DC
  Administered 2023-06-12 – 2023-06-13 (×2): 21 mg via TRANSDERMAL
  Filled 2023-06-12 (×2): qty 1

## 2023-06-12 MED ORDER — LISINOPRIL 20 MG PO TABS
20.0000 mg | ORAL_TABLET | Freq: Every day | ORAL | Status: DC
  Administered 2023-06-12: 20 mg via ORAL
  Filled 2023-06-12: qty 1

## 2023-06-12 MED ORDER — PRAVASTATIN SODIUM 40 MG PO TABS
40.0000 mg | ORAL_TABLET | Freq: Every day | ORAL | Status: DC
  Administered 2023-06-12: 40 mg via ORAL
  Filled 2023-06-12: qty 1

## 2023-06-12 MED ORDER — HYDROCHLOROTHIAZIDE 12.5 MG PO TABS
12.5000 mg | ORAL_TABLET | Freq: Every day | ORAL | Status: DC
  Administered 2023-06-12: 12.5 mg via ORAL
  Filled 2023-06-12: qty 1

## 2023-06-12 MED ORDER — TICAGRELOR 90 MG PO TABS
90.0000 mg | ORAL_TABLET | Freq: Two times a day (BID) | ORAL | Status: DC
  Administered 2023-06-13: 90 mg via ORAL
  Filled 2023-06-12: qty 1

## 2023-06-12 NOTE — Evaluation (Signed)
 Physical Therapy Evaluation Patient Details Name: Henry Boone MRN: [id number] DOB: 1966/03/08 Today's Date: 06/12/2023  History of Present Illness  Pt is a 58 y.o. male presenting 2/25 with L sided weakness and numbness as well as worsening baseline visual deficits. S/p R common carotid arteriogram 2/25. Outside of window for TNK. 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  Clinical Impression  Patient lives at home with his wife and is independent will all ADLs/IADLs. Based on the PT evaluation, patient has impaired balance for more challenging tasks (SLS and tandem stance), Berg score indicates a low-moderate fall risk, however an AD is not recommended at this time. Patient demonstrates impaired sensation and gross muscle control on his L side. L C1-C4 sensation is diminished compared to his right, L1-3 sensation is diminished compared to his right, and generalized LLE muscle weakness assessed. Patient required CGA during ambulation and stairs, requiring HHAfor stairs only to ensure safety. Overall, the patient is progressing well and would benefit from OP Neuro PT in order to improve his strength, balance, and minimal unsteadiness when ambulating. Acute PT will follow up. Thank you for this consult.       If plan is discharge home, recommend the following: Help with stairs or ramp for entrance;Assist for transportation;Assistance with cooking/housework;A little help with walking and/or transfers   Can travel by private vehicle        Equipment Recommendations None recommended by PT  Recommendations for Other Services       Functional Status Assessment Patient has had a recent decline in their functional status and demonstrates the ability to make significant improvements in function in a reasonable and predictable amount of time.     Precautions / Restrictions Precautions Precautions:  Fall Restrictions Weight Bearing Restrictions Per Provider Order: No      Mobility  Bed Mobility                    Transfers Overall transfer level: Needs assistance Equipment used: None Transfers: Sit to/from Stand Sit to Stand: Contact guard assist           General transfer comment: CGA to ensure safety    Ambulation/Gait Ambulation/Gait assistance: Contact guard assist Gait Distance (Feet): 200 Feet Assistive device: None Gait Pattern/deviations: Narrow base of support, Drifts right/left, Ataxic       General Gait Details: Ataxic gait, observed unsteadiness although patient reports feeling normal  Stairs Stairs: Yes Stairs assistance: Contact guard assist Stair Management: No rails, Step to pattern, Forwards Number of Stairs: 3 General stair comments: with HHA for additional support when descending/ascending steps  Wheelchair Mobility     Tilt Bed    Modified Rankin (Stroke Patients Only) Modified Rankin (Stroke Patients Only) Pre-Morbid Rankin Score: No significant disability Modified Rankin: No significant disability     Balance Overall balance assessment: Needs assistance Sitting-balance support: No upper extremity supported, Feet supported Sitting balance-Leahy Scale: Good     Standing balance support: No upper extremity supported Standing balance-Leahy Scale: Good Standing balance comment: Tandem and SLS more challenging                 Standardized Balance Assessment Standardized Balance Assessment : Berg Balance Test Berg Balance Test Sit to Stand: Able to stand without using hands and stabilize independently Standing Unsupported: Able to stand safely 2 minutes Sitting with Back Unsupported but Feet Supported on Floor or Stool: Able to  sit safely and securely 2 minutes Stand to Sit: Sits safely with minimal use of hands Transfers: Able to transfer safely, minor use of hands Standing Unsupported with Eyes Closed: Able to  stand 10 seconds safely Standing Ubsupported with Feet Together: Able to place feet together independently and stand 1 minute safely From Standing, Reach Forward with Outstretched Arm: Can reach confidently >25 cm (10") From Standing Position, Pick up Object from Floor: Able to pick up shoe safely and easily From Standing Position, Turn to Look Behind Over each Shoulder: Looks behind from both sides and weight shifts well Turn 360 Degrees: Able to turn 360 degrees safely in 4 seconds or less Standing Unsupported, Alternately Place Feet on Step/Stool: Able to complete 4 steps without aid or supervision Standing Unsupported, One Foot in Front: Loses balance while stepping or standing Standing on One Leg: Able to lift leg independently and hold equal to or more than 3 seconds Total Score: 48         Pertinent Vitals/Pain Pain Assessment Pain Assessment: No/denies pain    Home Living Family/patient expects to be discharged to:: Private residence Living Arrangements: Spouse/significant other Available Help at Discharge: Family;Available PRN/intermittently Type of Home: House Home Access: Stairs to enter Entrance Stairs-Rails: None Entrance Stairs-Number of Steps: 2   Home Layout: One level Home Equipment: None      Prior Function Prior Level of Function : Independent/Modified Independent;Working/employed;Driving             Mobility Comments: Completed functional mobility independently ADLs Comments: Completed ADLs independently.     Extremity/Trunk Assessment   Upper Extremity Assessment Upper Extremity Assessment: Defer to OT evaluation LUE Deficits / Details: Able to use LUE functionally. Impaired coordination noted. Slow and deliberate movements of LUE. LUE Sensation: decreased light touch LUE Coordination: decreased fine motor    Lower Extremity Assessment Lower Extremity Assessment: LLE deficits/detail LLE Deficits / Details: Generalized weakness and diminished  sensation LLE Sensation: decreased light touch LLE Coordination: decreased gross motor       Communication   Communication Communication: No apparent difficulties    Cognition Arousal: Alert Behavior During Therapy: WFL for tasks assessed/performed   PT - Cognitive impairments: No apparent impairments                         Following commands: Intact       Cueing Cueing Techniques: Verbal cues, Tactile cues     General Comments General comments (skin integrity, edema, etc.): Patient seen sitting in recliner prior to treatment, chair alarm off    Exercises     Assessment/Plan    PT Assessment Patient needs continued PT services  PT Problem List Decreased strength;Decreased activity tolerance;Decreased balance;Impaired sensation;Decreased mobility;Decreased coordination       PT Treatment Interventions Gait training;Functional mobility training;Stair training;Therapeutic activities;Therapeutic exercise;Balance training;Patient/family education    PT Goals (Current goals can be found in the Care Plan section)  Acute Rehab PT Goals Patient Stated Goal: Return to work and home PT Goal Formulation: With patient Time For Goal Achievement: 06/26/23 Potential to Achieve Goals: Good    Frequency Min 2X/week     Co-evaluation               AM-PAC PT "6 Clicks" Mobility  Outcome Measure Help needed turning from your back to your side while in a flat bed without using bedrails?: A Little Help needed moving from lying on your back to sitting on the side of  a flat bed without using bedrails?: A Little Help needed moving to and from a bed to a chair (including a wheelchair)?: A Little Help needed standing up from a chair using your arms (e.g., wheelchair or bedside chair)?: None Help needed to walk in hospital room?: A Little Help needed climbing 3-5 steps with a railing? : A Little 6 Click Score: 19    End of Session Equipment Utilized During Treatment:  Gait belt Activity Tolerance: Patient tolerated treatment well Patient left: in chair;with call bell/phone within reach;with chair alarm set Nurse Communication: Mobility status PT Visit Diagnosis: Hemiplegia and hemiparesis;Muscle weakness (generalized) (M62.81);Other abnormalities of gait and mobility (R26.89) Hemiplegia - Right/Left: Left Hemiplegia - dominant/non-dominant: Non-dominant Hemiplegia - caused by: Cerebral infarction    Time: 1610-9604 PT Time Calculation (min) (ACUTE ONLY): 32 min   Charges:   PT Evaluation $PT Eval Moderate Complexity: 1 Mod PT Treatments $Therapeutic Activity: 8-22 mins PT General Charges $$ ACUTE PT VISIT: 1 Visit         Doreen Beam, SPT   Ashaki Frosch 06/12/2023, 4:55 PM

## 2023-06-12 NOTE — Evaluation (Signed)
 Speech Language Pathology Evaluation Patient Details Name: Henry Boone MRN: [id number] DOB: 06-16-1965 Today's Date: 06/12/2023 Time: 7829-5621 SLP Time Calculation (min) (ACUTE ONLY): 15 min  Problem List:  Patient Active Problem List   Diagnosis Date Noted   Acute cerebrovascular accident (CVA) due to occlusion of right carotid artery (HCC) 06/11/2023   Internal carotid artery occlusion, right 06/11/2023   Carotid occlusion, left 07/25/2017   Carotid stenosis, right 07/25/2017   History of ischemic multifocal multiple vascular territories stroke 07/25/2017   Essential hypertension 07/25/2017   Hyperlipidemia 07/25/2017   Tobacco use disorder 07/25/2017   Leukocytosis 07/25/2017   Cerebrovascular accident (CVA) due to bilateral occlusion of middle cerebral arteries (HCC) 07/23/2017   Cerebral thrombosis with cerebral infarction (HCC) 11/12/2014   Amaurosis fugax 11/11/2014   Past Medical History:  Past Medical History:  Diagnosis Date   ETOH abuse    History of cerebral artery stenosis    Hyperlipidemia    Hypertension    Stroke [facility])    Past Surgical History:  Past Surgical History:  Procedure Laterality Date   COLONOSCOPY WITH PROPOFOL N/A 01/20/2019   Procedure: COLONOSCOPY WITH PROPOFOL;  Surgeon: Toledo, Boykin Nearing, MD;  Location: [facility] ENDOSCOPY;  Service: Gastroenterology;  Laterality: N/A;   IR ANGIO EXTRACRAN SEL COM CAROTID INNOMINATE UNI BILAT MOD SED  07/23/2017   IR ANGIO INTRA EXTRACRAN SEL COM CAROTID INNOMINATE UNI R MOD SED  11/07/2017   IR ANGIO VERTEBRAL SEL SUBCLAVIAN INNOMINATE UNI R MOD SED  11/07/2017   IR ANGIO VERTEBRAL SEL VERTEBRAL BILAT MOD SED  07/23/2017   MICROLARYNGOSCOPY N/A 06/30/2019   Procedure: SUSPENSION MICROLARYNGOSCOPY WITH MICROFLAP;  Surgeon: Bud Face, MD;  Location: [facility] ORS;  Service: ENT;  Laterality: N/A;   RADIOLOGY WITH ANESTHESIA N/A 11/07/2017   Procedure: STENTING;  Surgeon: Julieanne Cotton, MD;  Location: [facility] OR;   Service: Radiology;  Laterality: N/A;   RADIOLOGY WITH ANESTHESIA N/A 06/11/2023   Procedure: IR WITH ANESTHESIA;  Surgeon: Radiologist, Medication, MD;  Location: [facility] OR;  Service: Radiology;  Laterality: N/A;   TEE WITHOUT CARDIOVERSION N/A 11/15/2014   Procedure: TRANSESOPHAGEAL ECHOCARDIOGRAM (TEE);  Surgeon: Antonieta Iba, MD;  Location: [facility] ORS;  Service: Cardiovascular;  Laterality: N/A;   TONSILLECTOMY     HPI:  Patient is a 58 y.o. male with PMH: prior right parietal and septal ischemic infarcts with residual mild hemianopia, left carotid occlusion (2019). He presented to the hospital on 06/11/23 with left sided weakness and numbness and worsening baseline isual deficits. MRI brain showed Scattered acute infarcts in the right cerebral hemisphere.   Assessment / Plan / Recommendation Clinical Impression  Patient is not currently presenting with impairments in areas of cognition, speech, or language. He is oriented x4, able to recall recent medical and therapeutic interventions and shows very good awareness to his impairments. He did not exhibit any facial, bilabial or lingual weakness or impaired ROM. He did report numbness on left side of face "but no droop this time". He endorses numbness on entire left side of body but with some improvements today. His main concern is his balance and safety with standing and walking, telling SLP, "I dont like not being steady". He also is concerned about his ability to type on computer with his left hand being numb and weak as he works from home in [employer]. He does not wish to go to inpatient rehab, telling SLP, "two weeks is too long" and he would prefer home health or OP services.  SLP not recommending further intervention at this time.    SLP Assessment  SLP Recommendation/Assessment: Patient does not need any further Speech Lanaguage Pathology Services SLP Visit Diagnosis: Cognitive communication deficit (R41.841)    Recommendations for follow  up therapy are one component of a multi-disciplinary discharge planning process, led by the attending physician.  Recommendations may be updated based on patient status, additional functional criteria and insurance authorization.    Follow Up Recommendations  No SLP follow up    Assistance Recommended at Discharge  None  Functional Status Assessment Patient has not had a recent decline in their functional status  Frequency and Duration           SLP Evaluation Cognition  Overall Cognitive Status: Within Functional Limits for tasks assessed Arousal/Alertness: Awake/alert Orientation Level: Oriented X4       Comprehension  Auditory Comprehension Overall Auditory Comprehension: Appears within functional limits for tasks assessed    Expression Expression Primary Mode of Expression: Verbal Verbal Expression Overall Verbal Expression: Appears within functional limits for tasks assessed Initiation: No impairment Repetition: No impairment Naming: No impairment Pragmatics: No impairment   Oral / Motor  Oral Motor/Sensory Function Overall Oral Motor/Sensory Function: Within functional limits Motor Speech Overall Motor Speech: Appears within functional limits for tasks assessed Respiration: Within functional limits Resonance: Within functional limits Articulation: Within functional limitis Intelligibility: Intelligible Motor Planning: Witnin functional limits Motor Speech Errors: Not applicable            Angela Nevin, MA, CCC-SLP Speech Therapy

## 2023-06-12 NOTE — Progress Notes (Signed)
 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 Status post complete revascularization of the occluded right internal carotid artery intracranial portion with 1 pass with a 4 mm x 40 mm Solitaire X retriever device  and 071 contact  aspiration achieving a TICI 2C .revascularization. 2 underlying intracranial arteriosclerosis related stenosis and revascularization with stent assisted angioplasty using a 3 mm x 15 mm frontier Onyx balloon minimal stent. Final right middle cerebral artery revascularization score of TICI  2C. Final CT of the brain demonstrates no evidence of intracranial hemorrhage or mass effect.  Doing well this am Moving all 4s Speech wnl  Examined by Dr Roselind Messier      Allergies: Patient has no known allergies.  Medications: Prior to Admission medications   Medication Sig Start Date End Date Taking? Authorizing Provider  amLODipine (NORVASC) 5 MG tablet Take 5 mg by mouth daily.   Yes [provider]  aspirin EC 81 MG tablet Take 81 mg by mouth daily.   Yes [provider]  clopidogrel (PLAVIX) 75 MG tablet Take 75 mg by mouth daily. 09/23/17  Yes [provider]  hydrochlorothiazide (HYDRODIURIL) 25 MG tablet Take 12.5 mg by mouth daily.  05/15/19  Yes [provider]  lisinopril (ZESTRIL) 20 MG tablet Take 20 mg by mouth daily.  06/30/17  Yes [provider]  Multiple Vitamin (MULTIVITAMIN WITH MINERALS) TABS tablet Take 1 tablet by mouth daily. 11/15/14  Yes Gale Journey, MD  ondansetron (ZOFRAN) 4 MG tablet Take 1 tablet (4 mg total) by mouth every 8 (eight) hours as needed for up to 10 doses for nausea or vomiting. 06/30/19  Yes Vaught, Roney Mans, MD  pravastatin (PRAVACHOL) 40 MG tablet  Take 1 tablet (40 mg total) by mouth daily. Patient taking differently: Take 40 mg by mouth every evening. 11/15/14  Yes Gale Journey, MD     Vital Signs: BP (!) 141/65   Pulse 67   Temp 98 F (36.7 C) (Oral)   Resp (!) 21   SpO2 92%   Physical Exam Vitals reviewed.  Eyes:     Extraocular Movements: Extraocular movements intact.  Cardiovascular:     Rate and Rhythm: Normal rate and regular rhythm.     Heart sounds: Normal heart sounds.  Pulmonary:     Effort: Pulmonary effort is normal.     Breath sounds: Normal breath sounds.  Abdominal:     Palpations: Abdomen is soft.  Musculoskeletal:        General: Normal range of motion.     Cervical back: Normal range of motion.     Comments: FROM Moving all 4s Follows all commands  Rt groin NT no bleeding No hematoma  Rt foot pulses 2+  Skin:    General: Skin is warm and dry.  Neurological:     General: No focal deficit present.     Mental Status: He is alert and oriented to person, place, and time.     Comments: Good strength B Good sensation B   Psychiatric:        Mood and Affect: Mood normal.        Behavior: Behavior normal.        Thought Content: Thought content normal.        Judgment:  Judgment normal.     Imaging: CT HEAD WO CONTRAST ( ) Result Date: 06/12/2023 CLINICAL DATA:  Follow-up examination for stroke, status post intervention. EXAM: CT HEAD WITHOUT CONTRAST TECHNIQUE: Contiguous axial images were obtained from the base of the skull through the vertex without intravenous contrast. RADIATION DOSE REDUCTION: This exam was performed according to the departmental dose-optimization program which includes automated exposure control, adjustment of the mA and/or kV according to patient size and/or use of iterative reconstruction technique. COMPARISON:  Prior studies from earlier the same day. FINDINGS: Brain: Generalized age-related cerebral atrophy with mild chronic microvascular ischemic disease. Remote  right MCA distribution infarcts noted, most pronounced at the right parietal lobe. Few small chronic left MCA distribution infarcts noted as well. No acute intracranial hemorrhage. Previously identified acute right MCA distribution infarcts are grossly similar, better seen on prior MRI. No significant mass effect. No other acute large vessel territory infarct. No mass lesion or midline shift. No hydrocephalus or extra-axial fluid collection. Vascular: No abnormal hyperdense vessel. Vascular stent in place within the right carotid siphon. Skull: Scalp soft tissues demonstrate no acute finding. Calvarium intact. Sinuses/Orbits: Globes and orbital soft tissues within normal limits. Paranasal sinuses remain largely clear. No significant mastoid effusion. Other: None. IMPRESSION: 1. Grossly stable appearance of previously identified acute right MCA distribution infarcts, better seen on prior MRI. No associated hemorrhage or significant mass effect. No complication status post catheter directed intervention. 2. No other new acute intracranial abnormality. 3. Underlying atrophy with chronic small vessel ischemic disease with additional remote bilateral MCA distribution infarcts, stable. Electronically Signed   By: Rise Mu M.D.   On: 06/12/2023 05:10   CT CHEST WO CONTRAST Result Date: 06/11/2023 CLINICAL DATA:  Lung nodule greater than 8 mm. History of stroke post carotid intervention and stent placement. EXAM: CT CHEST WITHOUT CONTRAST TECHNIQUE: Multidetector CT imaging of the chest was performed following the standard protocol without IV contrast. RADIATION DOSE REDUCTION: This exam was performed according to the departmental dose-optimization program which includes automated exposure control, adjustment of the mA and/or kV according to patient size and/or use of iterative reconstruction technique. COMPARISON:  Chest radiograph 07/23/2017. CT angio head and neck 06/11/2023 FINDINGS: Cardiovascular:  Normal heart size. No pericardial effusions. Normal caliber thoracic aorta. Scattered calcification in the aorta and coronary arteries. Mediastinum/Nodes: Esophagus is decompressed. No significant lymphadenopathy. Thyroid gland is unremarkable. Lungs/Pleura: Severe emphysematous changes in the lungs with prominent bullous changes in the apices. Left apical pulmonary nodule is again demonstrated as seen on prior CT neck. Lesion measures about 1.1 x 1.6 cm in diameter and extends to the pleural surface with tenting of the pleura. Several additional scattered pulmonary nodules are demonstrated including an irregular 7 mm nodule in the posterior right upper lung, series 4, image 78. no airspace disease or consolidation. No pleural effusion or pneumothorax. Upper Abdomen: Dense opacification of the gallbladder likely representing vicarious excretion. Calcification of the aorta with evidence of probable severe calcific stenosis of the infrarenal abdominal aorta. Residual contrast material in the renal collecting systems. Musculoskeletal: Degenerative changes in the spine. No destructive bone lesions. IMPRESSION: 1. 1.1 x 1.6 cm left apical pulmonary nodule is suspicious for neoplasm. Consider biopsy versus PET-CT for further evaluation. 2. Additional pulmonary nodules including an irregular 7 mm nodule in the posterior right upper lung. This could be also followed with PET-CT. 3. Prominent emphysema. 4. Aortic atherosclerosis with evidence of calcific stenosis of the infrarenal abdominal aorta. Electronically Signed   By: Chrissie Noa  Andria Meuse M.D.   On: 06/11/2023 20:39   ECHOCARDIOGRAM COMPLETE Result Date: 06/11/2023    ECHOCARDIOGRAM REPORT   Patient Name:   Henry Boone Date of Exam: 06/11/2023 Medical Rec #:  [id number]  Height:       72.0 in Accession #:    [id number] Weight:       145.0 lb Date of Birth:  [birth year] BSA:          1.858 m Patient Age:    57 years   BP:           161/109 mmHg Patient Gender: M           HR:           97 bpm. Exam Location:  Inpatient Procedure: 2D Echo, Color Doppler and Cardiac Doppler (Both Spectral and Color            Flow Doppler were utilized during procedure). Indications:    Stroke i63.9  History:        Patient has prior history of Echocardiogram examinations, most                 recent 07/23/2017. Risk Factors:Hypertension, Dyslipidemia and                 ETOH.  Sonographer:    Irving Burton Senior RDCS Referring Phys: [id number] SRISHTI L BHAGAT  Sonographer Comments: Very poor echo windows, majority obtained from subcostal. Patient is supine post IR. IMPRESSIONS  1. Left ventricular ejection fraction, by estimation, is 55 to 60%. The left ventricle has normal function. The left ventricle has no regional wall motion abnormalities. Left ventricular diastolic parameters are indeterminate.  2. Right ventricular systolic function is normal. The right ventricular size is normal. Tricuspid regurgitation signal is inadequate for assessing PA pressure.  3. The mitral valve is normal in structure. No evidence of mitral valve regurgitation.  4. The aortic valve has an indeterminant number of cusps. Aortic valve regurgitation is not visualized.  5. The inferior vena cava is normal in size with greater than 50% respiratory variability, suggesting right atrial pressure of 3 mmHg. FINDINGS  Left Ventricle: Left ventricular ejection fraction, by estimation, is 55 to 60%. The left ventricle has normal function. The left ventricle has no regional wall motion abnormalities. Strain imaging was not performed. The left ventricular internal cavity  size was normal in size. There is no left ventricular hypertrophy. Left ventricular diastolic parameters are indeterminate. Right Ventricle: The right ventricular size is normal. No increase in right ventricular wall thickness. Right ventricular systolic function is normal. Tricuspid regurgitation signal is inadequate for assessing PA pressure. Left Atrium: Left atrial size  was normal in size. Right Atrium: Right atrial size was normal in size. Pericardium: Trivial pericardial effusion is present. Mitral Valve: The mitral valve is normal in structure. No evidence of mitral valve regurgitation. Tricuspid Valve: The tricuspid valve is normal in structure. Tricuspid valve regurgitation is trivial. Aortic Valve: The aortic valve has an indeterminant number of cusps. Aortic valve regurgitation is not visualized. Pulmonic Valve: The pulmonic valve was normal in structure. Pulmonic valve regurgitation is not visualized. Aorta: The ascending aorta was not well visualized and the aortic root is normal in size and structure. Venous: The inferior vena cava is normal in size with greater than 50% respiratory variability, suggesting right atrial pressure of 3 mmHg. IAS/Shunts: No atrial level shunt detected by color flow Doppler. Additional Comments: 3D imaging was not performed.  LEFT VENTRICLE PLAX 2D LVIDd:  4.60 cm LVIDs:         3.40 cm LV PW:         1.00 cm LV IVS:        0.60 cm LVOT diam:     2.00 cm LV SV:         40 LV SV Index:   22 LVOT Area:     3.14 cm  RIGHT VENTRICLE RV S prime:     17.30 cm/s TAPSE (M-mode): 2.2 cm LEFT ATRIUM         Index LA diam:    3.30 cm 1.78 cm/m  AORTIC VALVE LVOT Vmax:   77.30 cm/s LVOT Vmean:  59.400 cm/s LVOT VTI:    0.128 m  AORTA Ao Root diam: 3.50 cm  SHUNTS Systemic VTI:  0.13 m Systemic Diam: 2.00 cm Clearnce Hasten Electronically signed by Clearnce Hasten Signature Date/Time: 06/11/2023/3:05:52 PM    Final    MR BRAIN WO CONTRAST Result Date: 06/11/2023 CLINICAL DATA:  History of prior stroke presenting with weakness and numbness to left side. Weakness and paresthesias in the left arm and slight paresthesia in the left leg. EXAM: MRI HEAD WITHOUT CONTRAST TECHNIQUE: Multiplanar, multiecho pulse sequences of the brain and surrounding structures were obtained without intravenous contrast. COMPARISON:  Same day CT head and CTA head and  neck. MRI brain 07/24/2017 FINDINGS: Brain: Small scattered foci of acute infarct involving the right ACA territory near the vertex involving the medial aspect of the right precentral gyrus. Additional scattered areas of acute infarct in the right superior parietal lobule with involvement of the postcentral gyrus. There are additional infarcts involving the right precentral gyrus in the region of the hand knob. Additional larger confluent focus of acute infarct involving the cortex and subcortical white matter in the lateral right occipital lobe. Few additional small scattered infarcts in the anterior right frontal lobe primarily involving the ACA territory and in the right caudate. Abnormal appearance of the right ICA flow void on FLAIR images compatible with occlusion noted on CT. No abnormal susceptibility to suggest intracranial hemorrhage. Encephalomalacia and surrounding gliosis in the right parietal lobe compatible with remote infarct. No edema or mass effect. Additional remote infarcts in the left frontal cortex, left occipital cortex, and left centrum semiovale. Limited evaluation of the orbits, calvarium, and sinuses without focal abnormality appreciated. IMPRESSION: 1. Scattered acute infarcts in the right cerebral hemisphere as above. 2. Abnormal appearance of the right ICA flow void on FLAIR images compatible with occlusion noted on CT. 3. No intracranial hemorrhage. 4. Remote infarcts in the right parietal lobe, left frontal cortex, left occipital cortex, and left centrum semiovale. Electronically Signed   By: Emily Filbert M.D.   On: 06/11/2023 11:26   CT ANGIO HEAD NECK W WO CM W PERF (CODE STROKE) Result Date: 06/11/2023 CLINICAL DATA:  Stroke workup EXAM: CT ANGIOGRAPHY HEAD AND NECK CT PERFUSION BRAIN TECHNIQUE: Multidetector CT imaging of the head and neck was performed using the standard protocol during bolus administration of intravenous contrast. Multiplanar CT image reconstructions and  MIPs were obtained to evaluate the vascular anatomy. Carotid stenosis measurements (when applicable) are obtained utilizing NASCET criteria, using the distal internal carotid diameter as the denominator. Multiphase CT imaging of the brain was performed following IV bolus contrast injection. Subsequent parametric perfusion maps were calculated using RAPID software. RADIATION DOSE REDUCTION: This exam was performed according to the departmental dose-optimization program which includes automated exposure control, adjustment of the mA and/or kV according  to patient size and/or use of iterative reconstruction technique. CONTRAST:  OMNIPAQUE IOHEXOL 350 MG/ML SOLN COMPARISON:  07/23/2017 FINDINGS: CTA NECK FINDINGS Aortic arch: Not covered Right carotid system: Premature atheromatous wall thickening of the common and internal carotid arteries. The right ICA is occluded beginning at the bulb, interval. Left carotid system: Heavily atheromatous common internal carotid arteries. The common carotid origin is not covered. Progressive atheromatous narrowing of the left ICA at the skull base, approximately 50%. No ulceration or beading. Vertebral arteries: Proximal subclavian atherosclerosis. Advanced narrowing of the left subclavian beyond the vertebral origin with irregular plaque that is significantly progressed. Chronic calcified plaque at the right vertebral origin with high-grade stenosis by coronal reformats. The right vertebral artery is dominant. Both vertebral arteries are patent to the dura with symmetric enhancement. No beading. Skeleton: No acute finding Other neck: No acute finding Upper chest: Paraseptal emphysema at the apices. 14 mm nodule in the subpleural left upper lobe with pleural tag appearance, concerning for tumor. Review of the MIP images confirms the above findings CTA HEAD FINDINGS Anterior circulation: The right ICA reconstitutes at the ophthalmic segment. No downstream branch occlusion is  seen. There is an anterior communicating artery which feeds the left ACA and MCA in the setting of chronic left cavernous ICA occlusion. Left trigeminal artery with atheromatous plaque. Posterior circulation: Small vertebral and basilar arteries until the basilar is accentuated from the persistent trigeminal artery on the left. The right PCA is fetal type. No major branch occlusion, beading, or aneurysm. Underfilling of the right PCA compared to the left, interval. Venous sinuses: Unremarkable for arterial timing Anatomic variants: As above Review of the MIP images confirms the above findings CT Brain Perfusion Findings: CBF (<30%) Volume: 7mL-overestimated given some mapping at the level of chronic right parietal infarct. Perfusion (Tmax>6.0s) volume: 231 cc, involving bilateral MCA distribution which is related to variant anatomy. Mismatch Volume: Emergent findings and pulmonary nodule relayed via epic chat. IMPRESSION: 1. Emergent large vessel occlusion at the right cervical carotid. There is intracranial reconstitution at the ophthalmic segment. The right ICA feeds the bilateral anterior circulation in the setting of chronic left cavernous carotid occlusion and the right PCA in the setting of fetal type PCA circulation on the right. Ischemic perfusion is seen in the bilateral MCA territory. 15 cc area of infarct reported in the right cerebral hemisphere, although overestimated given tagging of a chronic parietal infarct. 2. Persistent trigeminal artery on the left. 3. Chronic high-grade narrowing at the right vertebral origin due to calcified plaque. 4. Advanced narrowing of the left subclavian after the vertebral origin with irregular plaque. 5. Note that the arch and great vessel ostia are not covered. 6. 50% narrowing of the distal cervical ICA on the left, progressed from 2019. 7. Emphysema with suspicious 14 mm nodule at the left upper lobe, most concerning for tumor, recommend chest CT after  convalescence. Electronically Signed   By: Tiburcio Pea M.D.   On: 06/11/2023 08:42   CT HEAD CODE STROKE WO CONTRAST` Result Date: 06/11/2023 CLINICAL DATA:  Code stroke. Left-sided weakness and homonymous hemianopsia. EXAM: CT HEAD WITHOUT CONTRAST TECHNIQUE: Contiguous axial images were obtained from the base of the skull through the vertex without intravenous contrast. RADIATION DOSE REDUCTION: This exam was performed according to the departmental dose-optimization program which includes automated exposure control, adjustment of the mA and/or kV according to patient size and/or use of iterative reconstruction technique. COMPARISON:  07/24/2017 FINDINGS: Brain: Chronic right parietal and  upper occipital infarcts which are cortically based. Small chronic left superior frontal and occipital infarcts. There is a small area of possible cytotoxic edema in the anterior right frontal lobe, age indeterminate. No hemorrhage, hydrocephalus, mass, or collection. Vascular: No hyperdense vessel or unexpected calcification. Skull: Normal. Negative for fracture or focal lesion. Sinuses/Orbits: Negative Other: Prelim sent in epic chat. IMPRESSION: 1. Age-indeterminate tiny anterior right frontal cortex infarct. 2. Large chronic infarcts in the right parietal and occipital cortex. Small chronic infarcts scattered along the left cerebral convexity. 3. No intracranial hemorrhage. Electronically Signed   By: Tiburcio Pea M.D.   On: 06/11/2023 08:22    Labs:  CBC: Recent Labs    06/11/23 0746 06/12/23 0641  WBC 12.4* 15.4*  HGB 16.6 15.9  HCT 47.8 45.7  PLT 348 316    COAGS: Recent Labs    06/11/23 0746  INR 1.0  APTT 31    BMP: Recent Labs    06/11/23 0746 06/12/23 0641  NA 138 139  K 3.8 3.9  CL 105 104  CO2 23 24  GLUCOSE 132* 104*  BUN 7 7  CALCIUM 9.3 9.3  CREATININE 0.75 0.78  GFRNONAA >60 >60    LIVER FUNCTION TESTS: Recent Labs    06/11/23 0746  BILITOT 0.5  AST 17  ALT  10  ALKPHOS 60  PROT 7.2  ALBUMIN 4.2    Assessment and Plan:  Pt to see Dr Corliss Skains 2 weeks for follow up-  He will hear from [facility] scheduler for time and date No lifting over 10 lbs x 3 days No driving 2 weeks Continue ASA 81/Brilinta 90 mg BID daily Plan per Stroke Team   Electronically Signed: Robet Leu, PA-C 06/12/2023, 10:26 AM   I spent a total of 15 Minutes at the the patient's bedside AND on the patient's hospital floor or unit, greater than 50% of which was counseling/coordinating care for post R ICA revascularization angioplasty/stent

## 2023-06-12 NOTE — Evaluation (Addendum)
 Occupational Therapy Evaluation Patient Details Name: Henry Boone MRN: [id number] DOB: July 21, 1965 Today's Date: 06/12/2023   History of Present Illness   Pt is a 58 y.o. male presenting 2/25 with L sided weakness and numbness as well as worsening baseline visual deficits. S/p R common carotid arteriogram 2/25. Outside of window for TNK. 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     Clinical Impressions Pt evaluated s/p admission list above. At baseline, pt lives at home with wife, works, drives, and completes all ADLs/IADLs and functional mobility tasks independently. Upon evaluation, pt was limited by impaired balance, gait instability, deficits with LUE FM coordination, and visual deficits. Based on evaluation, pt will require up to MIN A for functional mobility and up to CGA to complete ADLs for safety. OT to continue following pt acutely to address functional needs with discharge recommendations of OP OT to maximize functional independence. Additionally recommend OP full visual field assessment with ophthalmologist.    If plan is discharge home, recommend the following:   A little help with bathing/dressing/bathroom;A little help with walking and/or transfers;Assistance with cooking/housework;Assist for transportation;Help with stairs or ramp for entrance     Functional Status Assessment   Patient has had a recent decline in their functional status and demonstrates the ability to make significant improvements in function in a reasonable and predictable amount of time.     Equipment Recommendations   Other (comment) (defer)     Recommendations for Other Services   Rehab consult     Precautions/Restrictions   Precautions Precautions: Fall Restrictions Weight Bearing Restrictions Per Provider Order: No     Mobility Bed Mobility Overal bed mobility: Needs  Assistance             General bed mobility comments: NT; received seated on BSC    Transfers Overall transfer level: Needs assistance Equipment used: 1 person hand held assist Transfers: Sit to/from Stand Sit to Stand: Contact guard assist                  Balance Overall balance assessment: Needs assistance Sitting-balance support: Bilateral upper extremity supported, Feet supported   Sitting balance - Comments: static sitting on BSC   Standing balance support: Single extremity supported, During functional activity Standing balance-Leahy Scale: Poor Standing balance comment: Pt completed peri care and grooming task standing supported at sink                           ADL either performed or assessed with clinical judgement   ADL Overall ADL's : Needs assistance/impaired Eating/Feeding: Independent   Grooming: Contact guard assist;Standing   Upper Body Bathing: Supervision/ safety;Sitting   Lower Body Bathing: Contact guard assist;Sitting/lateral leans;Sit to/from stand   Upper Body Dressing : Supervision/safety;Sitting   Lower Body Dressing: Contact guard assist;Sit to/from stand   Toilet Transfer: Minimal assistance;Ambulation;Comfort height toilet   Toileting- Clothing Manipulation and Hygiene: Contact guard assist;Sit to/from stand         General ADL Comments: CGA provided for safety while standing to perform grooming task. MIN A required for balance and safety due gait instability. Pt with difficulty identifying objects in LLQ.     Vision Baseline Vision/History: 1 Wears glasses Ability to See in Adequate Light: 1 Impaired Patient Visual Report: Peripheral vision impairment Vision Assessment?: Yes Tracking/Visual Pursuits: Able to track stimulus in all quads without difficulty Visual  Fields: Left visual field deficit Additional Comments: Difficulty identifying numbers in LLQ of visual field. At baseline, pt has a small central vision  impairment     Perception Perception: Not tested       Praxis Praxis: Not tested       Pertinent Vitals/Pain Pain Assessment Pain Assessment: No/denies pain     Extremity/Trunk Assessment Upper Extremity Assessment Upper Extremity Assessment: Left hand dominant;LUE deficits/detail LUE Deficits / Details: Able to use LUE functionally. Impaired coordination noted. Slow and deliberate movements of LUE. LUE Sensation: decreased light touch LUE Coordination: decreased fine motor   Lower Extremity Assessment Lower Extremity Assessment: Defer to PT evaluation       Communication Communication Communication: No apparent difficulties   Cognition Arousal: Alert Behavior During Therapy: WFL for tasks assessed/performed Cognition: No apparent impairments             OT - Cognition Comments: Pt answered questions appropriately. Followed verbal commands.                 Following commands: Intact       Cueing  General Comments   Cueing Techniques: Verbal cues      Exercises     Shoulder Instructions      Home Living Family/patient expects to be discharged to:: Private residence Living Arrangements: Spouse/significant other Available Help at Discharge: Family;Available PRN/intermittently Type of Home: House Home Access: Stairs to enter [employer] of Steps: 2 Entrance Stairs-Rails: None Home Layout: One level     Bathroom Shower/Tub: Chief Strategy Officer: Standard     Home Equipment: None          Prior Functioning/Environment Prior Level of Function : Independent/Modified Independent;Working/employed;Driving             Mobility Comments: Completed functional mobility independently ADLs Comments: Completed ADLs independently.    OT Problem List: Decreased strength;Impaired balance (sitting and/or standing);Impaired vision/perception;Decreased coordination;Decreased safety awareness;Impaired UE functional use    OT Treatment/Interventions: Self-care/ADL training;Therapeutic exercise;Neuromuscular education;Therapeutic activities;Visual/perceptual remediation/compensation;Patient/family education;Balance training      OT Goals(Current goals can be found in the care plan section)   Acute Rehab OT Goals Patient Stated Goal: home OT Goal Formulation: With patient Time For Goal Achievement: 06/26/23 Potential to Achieve Goals: Good ADL Goals Pt Will Perform Grooming: standing;with supervision Pt Will Perform Upper Body Dressing: Independently;sitting Pt Will Transfer to Toilet: with supervision;ambulating;regular height toilet Pt Will Perform Toileting - Clothing Manipulation and hygiene: with supervision;sit to/from stand Pt/caregiver will Perform Home Exercise Program: Increased strength;Left upper extremity;Independently;With theraputty;With written HEP provided   OT Frequency:  Min 1X/week    Co-evaluation              AM-PAC OT "6 Clicks" Daily Activity     Outcome Measure Help from another person eating meals?: None Help from another person taking care of personal grooming?: A Little Help from another person toileting, which includes using toliet, bedpan, or urinal?: A Little Help from another person bathing (including washing, rinsing, drying)?: A Little Help from another person to put on and taking off regular upper body clothing?: A Little Help from another person to put on and taking off regular lower body clothing?: A Little 6 Click Score: 19   End of Session Equipment Utilized During Treatment: Gait belt Nurse Communication: Mobility status  Activity Tolerance: Patient tolerated treatment well Patient left: in chair;with call bell/phone within reach;with family/visitor present  OT Visit Diagnosis: Unsteadiness on feet (R26.81);Other abnormalities of gait  and mobility (R26.89);History of falling (Z91.81);Muscle weakness (generalized) (M62.81);Hemiplegia and  hemiparesis Hemiplegia - Right/Left: Left Hemiplegia - dominant/non-dominant: Dominant Hemiplegia - caused by: Cerebral infarction                Time: 1207-1231 OT Time Calculation (min): 24 min Charges:  OT General Charges $OT Visit: 1 Visit OT Evaluation $OT Eval Moderate Complexity: 1 Mod OT Treatments $Self Care/Home Management : 8-22 mins  Kevan Ny, MOTS  Henry Boone 06/12/2023, 2:18 PM

## 2023-06-12 NOTE — Telephone Encounter (Signed)
Patient Product/process development scientist completed.    The patient is insured through [facility]. Patient has [employer], may use a copay card, and/or apply for patient assistance if available.    Ran test claim for Brilinta 90 mg and the current 30 day co-pay is $60.00.   This test claim was processed through [facility]- copay amounts may vary at other pharmacies due to pharmacy/plan contracts, or as the patient moves through the different stages of their insurance plan.     Roland Earl, CPHT Pharmacy Technician III Certified Patient Advocate [facility] Patient Advocate Team Direct Number: [phone]  Fax: [phone]

## 2023-06-12 NOTE — Progress Notes (Signed)
 Patients A-line positional. Cuff pressures not correlating with A-line. Wave form dampened, and No blood return. Asked assistance from RT, they came to assess it and A-line determined to be clotted off.   A-line removed.  Dr. Derry Lory made aware. Okay to go off Cuff pressures    Signed,  Charmian Muff, RN  1:51 AM 06/12/2023

## 2023-06-12 NOTE — Progress Notes (Addendum)
 STROKE TEAM PROGRESS NOTE   INTERIM HISTORY/SUBJECTIVE  Patient is awake alert and oriented sitting up in bed.  Cleviprex drip has been turned off this morning Underwent IR procedure yesterday of occluded right ICA with TICI 2C revascularization, with 2 areas of arterial sclerosis related stenosis and revascularization with stent assisted angioplasty, middle cerebral artery revascularization of TICI 2C  He states that his symptoms are better with some slight weakness on the left leg and some numbness  He is on aspirin and Plavix will change Plavix to Brilinta twice daily Will send hypercoagulable panel  OBJECTIVE  CBC    Component Value Date/Time   WBC 15.4 (H) 06/12/2023 0641   RBC 5.25 06/12/2023 0641   HGB 15.9 06/12/2023 0641   HCT 45.7 06/12/2023 0641   PLT 316 06/12/2023 0641   MCV 87.0 06/12/2023 0641   MCH 30.3 06/12/2023 0641   MCHC 34.8 06/12/2023 0641   RDW 13.2 06/12/2023 0641   LYMPHSABS 2.8 06/12/2023 0641   MONOABS 1.3 (H) 06/12/2023 0641   EOSABS 0.0 06/12/2023 0641   BASOSABS 0.1 06/12/2023 0641    BMET    Component Value Date/Time   NA 139 06/12/2023 0641   K 3.9 06/12/2023 0641   CL 104 06/12/2023 0641   CO2 24 06/12/2023 0641   GLUCOSE 104 (H) 06/12/2023 0641   BUN 7 06/12/2023 0641   CREATININE 0.78 06/12/2023 0641   CALCIUM 9.3 06/12/2023 0641   GFRNONAA >60 06/12/2023 0641    IMAGING past 24 hours CT HEAD WO CONTRAST ( ) Result Date: 06/12/2023 CLINICAL DATA:  Follow-up examination for stroke, status post intervention. EXAM: CT HEAD WITHOUT CONTRAST TECHNIQUE: Contiguous axial images were obtained from the base of the skull through the vertex without intravenous contrast. RADIATION DOSE REDUCTION: This exam was performed according to the departmental dose-optimization program which includes automated exposure control, adjustment of the mA and/or kV according to patient size and/or use of iterative reconstruction technique. COMPARISON:  Prior  studies from earlier the same day. FINDINGS: Brain: Generalized age-related cerebral atrophy with mild chronic microvascular ischemic disease. Remote right MCA distribution infarcts noted, most pronounced at the right parietal lobe. Few small chronic left MCA distribution infarcts noted as well. No acute intracranial hemorrhage. Previously identified acute right MCA distribution infarcts are grossly similar, better seen on prior MRI. No significant mass effect. No other acute large vessel territory infarct. No mass lesion or midline shift. No hydrocephalus or extra-axial fluid collection. Vascular: No abnormal hyperdense vessel. Vascular stent in place within the right carotid siphon. Skull: Scalp soft tissues demonstrate no acute finding. Calvarium intact. Sinuses/Orbits: Globes and orbital soft tissues within normal limits. Paranasal sinuses remain largely clear. No significant mastoid effusion. Other: None. IMPRESSION: 1. Grossly stable appearance of previously identified acute right MCA distribution infarcts, better seen on prior MRI. No associated hemorrhage or significant mass effect. No complication status post catheter directed intervention. 2. No other new acute intracranial abnormality. 3. Underlying atrophy with chronic small vessel ischemic disease with additional remote bilateral MCA distribution infarcts, stable. Electronically Signed   By: Rise Mu M.D.   On: 06/12/2023 05:10   CT CHEST WO CONTRAST Result Date: 06/11/2023 CLINICAL DATA:  Lung nodule greater than 8 mm. History of stroke post carotid intervention and stent placement. EXAM: CT CHEST WITHOUT CONTRAST TECHNIQUE: Multidetector CT imaging of the chest was performed following the standard protocol without IV contrast. RADIATION DOSE REDUCTION: This exam was performed according to the departmental dose-optimization program which includes automated  exposure control, adjustment of the mA and/or kV according to patient size and/or  use of iterative reconstruction technique. COMPARISON:  Chest radiograph 07/23/2017. CT angio head and neck 06/11/2023 FINDINGS: Cardiovascular: Normal heart size. No pericardial effusions. Normal caliber thoracic aorta. Scattered calcification in the aorta and coronary arteries. Mediastinum/Nodes: Esophagus is decompressed. No significant lymphadenopathy. Thyroid gland is unremarkable. Lungs/Pleura: Severe emphysematous changes in the lungs with prominent bullous changes in the apices. Left apical pulmonary nodule is again demonstrated as seen on prior CT neck. Lesion measures about 1.1 x 1.6 cm in diameter and extends to the pleural surface with tenting of the pleura. Several additional scattered pulmonary nodules are demonstrated including an irregular 7 mm nodule in the posterior right upper lung, series 4, image 78. no airspace disease or consolidation. No pleural effusion or pneumothorax. Upper Abdomen: Dense opacification of the gallbladder likely representing vicarious excretion. Calcification of the aorta with evidence of probable severe calcific stenosis of the infrarenal abdominal aorta. Residual contrast material in the renal collecting systems. Musculoskeletal: Degenerative changes in the spine. No destructive bone lesions. IMPRESSION: 1. 1.1 x 1.6 cm left apical pulmonary nodule is suspicious for neoplasm. Consider biopsy versus PET-CT for further evaluation. 2. Additional pulmonary nodules including an irregular 7 mm nodule in the posterior right upper lung. This could be also followed with PET-CT. 3. Prominent emphysema. 4. Aortic atherosclerosis with evidence of calcific stenosis of the infrarenal abdominal aorta. Electronically Signed   By: Burman Nieves M.D.   On: 06/11/2023 20:39   ECHOCARDIOGRAM COMPLETE Result Date: 06/11/2023    ECHOCARDIOGRAM REPORT   Patient Name:   Henry Boone Date of Exam: 06/11/2023 Medical Rec #:  [id number]  Height:       72.0 in Accession #:    [id number] Weight:        145.0 lb Date of Birth:  1965-11-05 BSA:          1.858 m Patient Age:    57 years   BP:           161/109 mmHg Patient Gender: M          HR:           97 bpm. Exam Location:  Inpatient Procedure: 2D Echo, Color Doppler and Cardiac Doppler (Both Spectral and Color            Flow Doppler were utilized during procedure). Indications:    Stroke i63.9  History:        Patient has prior history of Echocardiogram examinations, most                 recent 07/23/2017. Risk Factors:Hypertension, Dyslipidemia and                 ETOH.  Sonographer:    Irving Burton Senior RDCS Referring Phys: [id number] SRISHTI L BHAGAT  Sonographer Comments: Very poor echo windows, majority obtained from subcostal. Patient is supine post IR. IMPRESSIONS  1. Left ventricular ejection fraction, by estimation, is 55 to 60%. The left ventricle has normal function. The left ventricle has no regional wall motion abnormalities. Left ventricular diastolic parameters are indeterminate.  2. Right ventricular systolic function is normal. The right ventricular size is normal. Tricuspid regurgitation signal is inadequate for assessing PA pressure.  3. The mitral valve is normal in structure. No evidence of mitral valve regurgitation.  4. The aortic valve has an indeterminant number of cusps. Aortic valve regurgitation is not visualized.  5. The inferior vena cava is  normal in size with greater than 50% respiratory variability, suggesting right atrial pressure of 3 mmHg. FINDINGS  Left Ventricle: Left ventricular ejection fraction, by estimation, is 55 to 60%. The left ventricle has normal function. The left ventricle has no regional wall motion abnormalities. Strain imaging was not performed. The left ventricular internal cavity  size was normal in size. There is no left ventricular hypertrophy. Left ventricular diastolic parameters are indeterminate. Right Ventricle: The right ventricular size is normal. No increase in right ventricular wall thickness. Right  ventricular systolic function is normal. Tricuspid regurgitation signal is inadequate for assessing PA pressure. Left Atrium: Left atrial size was normal in size. Right Atrium: Right atrial size was normal in size. Pericardium: Trivial pericardial effusion is present. Mitral Valve: The mitral valve is normal in structure. No evidence of mitral valve regurgitation. Tricuspid Valve: The tricuspid valve is normal in structure. Tricuspid valve regurgitation is trivial. Aortic Valve: The aortic valve has an indeterminant number of cusps. Aortic valve regurgitation is not visualized. Pulmonic Valve: The pulmonic valve was normal in structure. Pulmonic valve regurgitation is not visualized. Aorta: The ascending aorta was not well visualized and the aortic root is normal in size and structure. Venous: The inferior vena cava is normal in size with greater than 50% respiratory variability, suggesting right atrial pressure of 3 mmHg. IAS/Shunts: No atrial level shunt detected by color flow Doppler. Additional Comments: 3D imaging was not performed.  LEFT VENTRICLE PLAX 2D LVIDd:         4.60 cm LVIDs:         3.40 cm LV PW:         1.00 cm LV IVS:        0.60 cm LVOT diam:     2.00 cm LV SV:         40 LV SV Index:   22 LVOT Area:     3.14 cm  RIGHT VENTRICLE RV S prime:     17.30 cm/s TAPSE (M-mode): 2.2 cm LEFT ATRIUM         Index LA diam:    3.30 cm 1.78 cm/m  AORTIC VALVE LVOT Vmax:   77.30 cm/s LVOT Vmean:  59.400 cm/s LVOT VTI:    0.128 m  AORTA Ao Root diam: 3.50 cm  SHUNTS Systemic VTI:  0.13 m Systemic Diam: 2.00 cm Clearnce Hasten Electronically signed by Clearnce Hasten Signature Date/Time: 06/11/2023/3:05:52 PM    Final     Vitals:   06/12/23 0815 06/12/23 0833 06/12/23 0900 06/12/23 1000  BP: (!) 142/65 138/60 (!) 142/68 (!) 141/65  Pulse: 80 88 91 67  Resp: 20 17 19  (!) 21  Temp:      TempSrc:      SpO2: 95% 91% (!) 86% 92%     PHYSICAL EXAM General:  Alert, well-nourished, well-developed patient  in no acute distress Psych:  Mood and affect appropriate for situation CV: Regular rate and rhythm on monitor Respiratory:  Regular, unlabored respirations on room air GI: Abdomen soft and nontender   NEURO:  Mental Status: AA&Ox3, patient is able to give clear and coherent history Speech/Language: speech is without dysarthria or aphasia.  Naming, repetition, fluency, and comprehension intact.  Cranial Nerves:  II: PERRL. Visual fields full.  III, IV, VI: EOMI. Eyelids elevate symmetrically.  V: Sensation is intact to light touch and symmetrical to face.  VII: Face is symmetrical resting and smiling VIII: hearing intact to voice. IX, X: Palate elevates symmetrically. Phonation is normal.  VH:QIONGEXB shrug 5/5. XII: tongue is midline without fasciculations. Motor: 5/5 strength to all muscle groups tested.  Tone: is normal and bulk is normal Sensation-decreased on left Coordination: FTN intact bilaterally, HKS: no ataxia in BLE.No drift.  Gait- deferred  Most Recent NIH  1a Level of Conscious.: 0 1b LOC Questions: 0 1c LOC Commands: 0 2 Best Gaze: 0 3 Visual:0 4 Facial Palsy: 0 5a Motor Arm - left: 0 5b Motor Arm - Right: 0 6a Motor Leg - Left: 0 6b Motor Leg - Right: 0 7 Limb Ataxia: 0 8 Sensory: 1 9 Best Language: 0 10 Dysarthria: 0 11 Extinct. and Inatten.: 0 TOTAL: 1   ASSESSMENT/PLAN  Henry Boone is a 58 y.o. 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  NIH on Admission for  Acute Ischemic Infarct:  right MCA s/p occluded right ICA with TICI 2C revascularization, with 2 areas of arterial sclerosis related stenosis and revascularization with stent assisted angioplasty, middle cerebral artery revascularization of TICI 2C Etiology: Large  vessel disease due to right carotid occlusion Code Stroke  CT head  Age-indeterminate tiny anterior right frontal cortex infarct  CTA head & neck Emergent large vessel occlusion at the right cervical carotid  CT perfusion Ischemic perfusion is seen in the bilateral MCA territory. 15 cc area of infarct reported in the right cerebral hemisphere,  Chronic high-grade narrowing at the right vertebral origin due to calcified plaque. Post IR CT no hemorrhage MRI  Scattered acute infarcts in the right cerebral hemisphere 2D Echo EF 55 to 60%. Hypercoagulable panel ordered LDL 68 HgbA1c 5.7 VTE prophylaxis -Lovenox aspirin 81 mg daily and clopidogrel 75 mg daily prior to admission, now on aspirin 81 mg daily and Brilinta (ticagrelor) 90 mg bid  Therapy recommendations:  Pending Disposition: Pending  Hx of Stroke/TIA 2016-posterior circulation infarcts 2019 bilateral MCA infarcts   Hypertensive Emergency  Home meds: Amlodipine 5 mg, lisinopril 20 mg UnStable Cleviprex turned off this morning Blood Pressure Goal: SBP 120-160 for first 24 hours then less than 180   Hyperlipidemia Home meds: Pravastatin 40 mg, resumed in hospital LDL 68, goal < 70 Continue statin at discharge  Tobacco Abuse Patient smokes 1 packs per day      Ready to quit? No Nicotine replacement therapy provided  Dysphagia Patient has post-stroke dysphagia, SLP consulted    Diet   Diet heart healthy/carb modified Room service appropriate? Yes; Fluid consistency: Thin   Advance diet as tolerated  Other Stroke Risk Factors ETOH use, alcohol level <10, advised to drink no more than 2 drink(s) a day Obesity, There is no height or weight on file to calculate BMI., BMI >/= 30 associated with increased stroke risk, recommend weight loss, diet and exercise as appropriate     Other Active Problems Leukocytosis WBC 15.4-afebrile.  Will monitor Lung nodules on CT chest-outpt workup  Hospital day # 1   Gevena Mart  DNP, ACNPC-AG  [facility]  ATTENDING ATTESTATION:  58 y/o heavy smoker with right carotid occlussion s/p IR RICI 2C with intracranial stenting. on plavix and aspirin post procedure, will switch to brillinta and aspirin tomorrow. Discussed with Dr. Corliss Skains.  H/o swuamous cell carcinoma. Lung nodule noted on CT chest. Discussed with Dr. Denese Killings, recommends outpt workup.  Maintain BP as above. Norvasc started.  Had partial hypercoag work up years ago, will complete.   Will need cardiac monitoring outpt to r/o afib, although etiology is likely from tobacco abuse. Tobacco cessation counseling provided.   Can transition out of ICU tomorrow.   Dr. Viviann Spare evaluated pt independently, reviewed imaging, chart, labs. Discussed and formulated plan with the Resident/APP. Changes were made to the note where appropriate. Please see APP/resident note above for details.      This patient is critically ill due to  stroke s/p IR and at significant risk of neurological worsening, death form heart failure, respiratory failure, recurrent stroke, bleeding from [facility], seizure, sepsis. This patient's care requires constant monitoring of vital signs, hemodynamics, respiratory and cardiac monitoring, review of multiple databases, neurological assessment, discussion with family, other specialists and medical decision making of high complexity. I spent 35 minutes of neurocritical care time in the care of this patient.   Sherhonda Gaspar,MD    To contact Stroke Continuity provider, please refer to [email or web address]. After hours, contact General Neurology

## 2023-06-12 NOTE — TOC CM/SW Note (Signed)
 [facility] - Inpatient Brief Assessment   Patient Details  Name: Henry Boone MRN: [id number] Date of Birth: August 02, 1965  [facility] CM/SW Contact:    Mearl Latin, LCSW Phone Number: 06/12/2023, 5:00 PM   Clinical Narrative: Patient admitted from home with significant other. TOC following for outpatient therapy referral.    Transition of Care Asessment: Insurance and Status: Insurance coverage has been reviewed Patient has primary care physician: Yes Home environment has been reviewed: From home Prior level of function:: Independent Prior/Current Home Services: No current home services Social Drivers of Health Review: SDOH reviewed no interventions necessary Readmission risk has been reviewed: Yes Transition of care needs: transition of care needs identified, TOC will continue to follow

## 2023-06-12 NOTE — Anesthesia Postprocedure Evaluation (Signed)
 Anesthesia Post Note  Patient: Henry Boone  Procedure(s) Performed: IR WITH ANESTHESIA     Patient location during evaluation: PACU Anesthesia Type: General Level of consciousness: sedated and patient cooperative Pain management: pain level controlled Vital Signs Assessment: post-procedure vital signs reviewed and stable Respiratory status: spontaneous breathing Cardiovascular status: stable Anesthetic complications: no   No notable events documented.  Last Vitals:  Vitals:   06/12/23 0700 06/12/23 0715  BP: 139/67 137/77  Pulse: 81 83  Resp: 13 16  Temp:    SpO2: 96% 96%    Last Pain:  Vitals:   06/12/23 0400  TempSrc: Oral  PainSc:                  [name]

## 2023-06-13 ENCOUNTER — Other Ambulatory Visit (HOSPITAL_COMMUNITY): Payer: Self-pay

## 2023-06-13 ENCOUNTER — Other Ambulatory Visit: Payer: Self-pay | Admitting: Neurology

## 2023-06-13 DIAGNOSIS — I639 Cerebral infarction, unspecified: Secondary | ICD-10-CM

## 2023-06-13 DIAGNOSIS — I63231 Cerebral infarction due to unspecified occlusion or stenosis of right carotid arteries: Secondary | ICD-10-CM | POA: Diagnosis not present

## 2023-06-13 LAB — BETA-2-GLYCOPROTEIN I ABS, IGG/M/A
Beta-2 Glyco I IgG: <9 GPI IgG units (ref 0–20)
Beta-2-Glycoprotein I IgA: <9 GPI IgA units (ref 0–25)
Beta-2-Glycoprotein I IgM: <9 GPI IgM units (ref 0–32)

## 2023-06-13 LAB — BASIC METABOLIC PANEL WITH GFR
Anion gap: 11 (ref 5–15)
BUN: 12 mg/dL (ref 6–20)
CO2: 23 mmol/L (ref 22–32)
Calcium: 9 mg/dL (ref 8.9–10.3)
Chloride: 100 mmol/L (ref 98–111)
Creatinine, Ser: 0.92 mg/dL (ref 0.61–1.24)
GFR, Estimated: >60 mL/min
Glucose, Bld: 100 mg/dL — ABNORMAL HIGH (ref 70–99)
Potassium: 3.7 mmol/L (ref 3.5–5.1)
Sodium: 134 mmol/L — ABNORMAL LOW (ref 135–145)

## 2023-06-13 LAB — LUPUS ANTICOAGULANT PANEL
DRVVT: 29.1 s (ref 0.0–47.0)
PTT Lupus Anticoagulant: 31.8 s (ref 0.0–43.5)

## 2023-06-13 LAB — CBC
HCT: 42.1 % (ref 39.0–52.0)
Hemoglobin: 15 g/dL (ref 13.0–17.0)
MCH: 30.9 pg (ref 26.0–34.0)
MCHC: 35.6 g/dL (ref 30.0–36.0)
MCV: 86.6 fL (ref 80.0–100.0)
Platelets: 259 10*3/uL (ref 150–400)
RBC: 4.86 MIL/uL (ref 4.22–5.81)
RDW: 13.1 % (ref 11.5–15.5)
WBC: 8.8 10*3/uL (ref 4.0–10.5)
nRBC: 0 % (ref 0.0–0.2)

## 2023-06-13 LAB — GLUCOSE, CAPILLARY
Glucose-Capillary: 101 mg/dL — ABNORMAL HIGH (ref 70–99)
Glucose-Capillary: 117 mg/dL — ABNORMAL HIGH (ref 70–99)
Glucose-Capillary: 181 mg/dL — ABNORMAL HIGH (ref 70–99)
Glucose-Capillary: 92 mg/dL (ref 70–99)

## 2023-06-13 LAB — PROTEIN S, TOTAL: Protein S Ag, Total: 74 % (ref 60–150)

## 2023-06-13 LAB — HOMOCYSTEINE: Homocysteine: 22.3 umol/L — ABNORMAL HIGH (ref 0.0–14.5)

## 2023-06-13 LAB — PROTEIN S ACTIVITY: Protein S Activity: 118 % (ref 63–140)

## 2023-06-13 LAB — CARDIOLIPIN ANTIBODIES, IGG, IGM, IGA
Anticardiolipin IgA: <9 U/mL (ref 0–11)
Anticardiolipin IgG: <9 GPL U/mL (ref 0–14)
Anticardiolipin IgM: <9 [MPL'U]/mL (ref 0–12)

## 2023-06-13 LAB — PROTEIN C ACTIVITY: Protein C Activity: 122 % (ref 73–180)

## 2023-06-13 MED ORDER — TICAGRELOR 90 MG PO TABS
90.0000 mg | ORAL_TABLET | Freq: Two times a day (BID) | ORAL | 0 refills | Status: DC
  Filled 2023-06-13: qty 60, 30d supply, fill #0

## 2023-06-13 MED ORDER — ASPIRIN 81 MG PO CHEW
81.0000 mg | CHEWABLE_TABLET | Freq: Every day | ORAL | 0 refills | Status: DC
  Filled 2023-06-13: qty 120, 120d supply, fill #0

## 2023-06-13 MED ORDER — NICOTINE 21 MG/24HR TD PT24
21.0000 mg | MEDICATED_PATCH | Freq: Every day | TRANSDERMAL | 0 refills | Status: DC
  Filled 2023-06-13: qty 28, 28d supply, fill #0

## 2023-06-13 NOTE — Plan of Care (Signed)
  Problem: Acute Rehab PT Goals(only PT should resolve) Goal: Pt Will Go Supine/Side To Sit Description: Under Supervision 06/13/2023 1142 by Newman Pies, Student-PT Outcome: Adequate for Discharge 06/13/2023 0803 by Newman Pies, Student-PT Flowsheets (Taken 06/13/2023 [phone]) Pt will go Supine/Side to Sit: with supervision Goal: Pt Will Perform Standing Balance Or Pre-Gait Description: Improve Tandem and Single Stance  06/13/2023 1142 by Newman Pies, Student-PT Outcome: Completed/Met Flowsheets (Taken 06/13/2023 0803) Pt will perform standing balance or pre-gait:  with Supervision  1-2 min  with no UE support 06/13/2023 0803 by Newman Pies, Student-PT Flowsheets (Taken 06/13/2023 0803) Pt will perform standing balance or pre-gait:  with Supervision  1-2 min  with no UE support Goal: Pt Will Ambulate Description: Under supervision, ambulate 276ft without an assistive device.  06/13/2023 1142 by Newman Pies, Student-PT Outcome: Completed/Met Flowsheets (Taken 06/13/2023 0803) Pt will Ambulate:  > 125 feet  with supervision  with least restrictive assistive device 06/13/2023 0803 by Newman Pies, Student-PT Flowsheets (Taken 06/13/2023 [phone]) Pt will Ambulate:  > 125 feet  with supervision  with least restrictive assistive device Goal: Pt Will Go Up/Down Stairs Description: Under supervision, ascend/descend 2-3 steps with no assistive device. 06/13/2023 1142 by Newman Pies, Student-PT Outcome: Completed/Met Flowsheets (Taken 06/13/2023 0803) Pt will Go Up / Down Stairs:  1-2 stairs  with supervision  with least restrictive assistive device 06/13/2023 0803 by Newman Pies, Student-PT Flowsheets (Taken 06/13/2023 [phone]) Pt will Go Up / Down Stairs:  1-2 stairs  with supervision  with least restrictive assistive device

## 2023-06-13 NOTE — Discharge Summary (Addendum)
 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 s/p IR and stenting with TICI2c, etiology: Large vessel disease   Secondary Diagnoses: Hypertension Hyperlipidemia Tobacco abuse History of stroke Leukocytosis  Allergies as of 06/13/2023   No Known Allergies      Medication List     STOP taking these medications    aspirin EC 81 MG tablet Replaced by: aspirin 81 MG chewable tablet   clopidogrel 75 MG tablet Commonly known as: PLAVIX   hydrochlorothiazide 25 MG tablet Commonly known as: HYDRODIURIL   lisinopril 20 MG tablet Commonly known as: ZESTRIL       TAKE these medications    amLODipine 5 MG tablet Commonly known as: NORVASC Take 5 mg by mouth daily.   aspirin 81 MG chewable tablet Chew 1 tablet (81 mg total) by mouth daily. Start taking on: June 14, 2023 Replaces: aspirin EC 81 MG tablet   multivitamin with minerals Tabs tablet Take 1 tablet by mouth daily.   nicotine 21 mg/24hr patch Commonly known as: NICODERM CQ - dosed in mg/24 hours Place 1 patch (21 mg total) onto the skin daily. Start taking on: June 14, 2023   ondansetron 4 MG tablet Commonly known as: Zofran Take 1 tablet (4 mg total) by mouth every 8 (eight) hours as needed for up to 10 doses for nausea or vomiting.   pravastatin 40 MG tablet Commonly known as: Pravachol Take 1 tablet (40 mg total) by mouth daily. What changed: when to take this   ticagrelor 90 MG Tabs tablet Commonly known as: BRILINTA Take 1 tablet (90 mg total) by mouth 2 (two) times daily.        LABORATORY STUDIES CBC    Component Value Date/Time   WBC 8.8 06/13/2023 0527   RBC 4.86  06/13/2023 0527   HGB 15.0 06/13/2023 0527   HCT 42.1 06/13/2023 0527   PLT 259 06/13/2023 0527   MCV 86.6 06/13/2023 0527   MCH 30.9 06/13/2023 0527   MCHC 35.6 06/13/2023 0527   RDW 13.1 06/13/2023 0527   LYMPHSABS 2.8 06/12/2023 0641   MONOABS 1.3 (H) 06/12/2023 0641   EOSABS 0.0 06/12/2023 0641   BASOSABS 0.1 06/12/2023 0641   CMP    Component Value Date/Time   NA 134 (L) 06/13/2023 0527   K 3.7 06/13/2023 0527   CL 100 06/13/2023 0527   CO2 23 06/13/2023 0527   GLUCOSE 100 (H) 06/13/2023 0527   BUN 12 06/13/2023 0527   CREATININE 0.92 06/13/2023 0527   CALCIUM 9.0 06/13/2023 0527   PROT 7.2 06/11/2023 0746   ALBUMIN 4.2 06/11/2023 0746   AST 17 06/11/2023 0746   ALT 10 06/11/2023 0746   ALKPHOS 60 06/11/2023 0746   BILITOT 0.5 06/11/2023 0746   GFRNONAA >60 06/13/2023 0527   GFRAA >60 06/25/2019 0905   COAGS Lab Results  Component Value Date   INR 1.0 06/11/2023   INR 0.98 11/04/2017   INR 0.93 07/23/2017   Lipid Panel    Component Value Date/Time   CHOL 132 06/12/2023 0641   TRIG 131 06/12/2023 0641   HDL 38 (L) 06/12/2023 0641   CHOLHDL 3.5  06/12/2023 0641   VLDL 26 06/12/2023 0641   LDLCALC 68 06/12/2023 0641   HgbA1C  Lab Results  Component Value Date   HGBA1C 5.7 (H) 06/12/2023    Alcohol Level    Component Value Date/Time   ETH <10 06/11/2023 0746     SIGNIFICANT DIAGNOSTIC STUDIES CT HEAD CODE STROKE WO CONTRAST` Addendum Date: 06/13/2023 ADDENDUM REPORT: 06/13/2023 12:44 ADDENDUM: ASPECTS 9 (abnormal Right M1 segment). Electronically Signed   By: Odessa Fleming M.D.   On: 06/13/2023 12:44   Result Date: 06/13/2023 CLINICAL DATA:  Code stroke. Left-sided weakness and homonymous hemianopsia. EXAM: CT HEAD WITHOUT CONTRAST TECHNIQUE: Contiguous axial images were obtained from the base of the skull through the vertex without intravenous contrast. RADIATION DOSE REDUCTION: This exam was performed according to the departmental dose-optimization  program which includes automated exposure control, adjustment of the mA and/or kV according to patient size and/or use of iterative reconstruction technique. COMPARISON:  07/24/2017 FINDINGS: Brain: Chronic right parietal and upper occipital infarcts which are cortically based. Small chronic left superior frontal and occipital infarcts. There is a small area of possible cytotoxic edema in the anterior right frontal lobe, age indeterminate. No hemorrhage, hydrocephalus, mass, or collection. Vascular: No hyperdense vessel or unexpected calcification. Skull: Normal. Negative for fracture or focal lesion. Sinuses/Orbits: Negative Other: Prelim sent in epic chat. IMPRESSION: 1. Age-indeterminate tiny anterior right frontal cortex infarct. 2. Large chronic infarcts in the right parietal and occipital cortex. Small chronic infarcts scattered along the left cerebral convexity. 3. No intracranial hemorrhage. Electronically Signed: By: Tiburcio Pea M.D. On: 06/11/2023 08:22   CT HEAD WO CONTRAST ( ) Result Date: 06/12/2023 CLINICAL DATA:  Follow-up examination for stroke, status post intervention. EXAM: CT HEAD WITHOUT CONTRAST TECHNIQUE: Contiguous axial images were obtained from the base of the skull through the vertex without intravenous contrast. RADIATION DOSE REDUCTION: This exam was performed according to the departmental dose-optimization program which includes automated exposure control, adjustment of the mA and/or kV according to patient size and/or use of iterative reconstruction technique. COMPARISON:  Prior studies from earlier the same day. FINDINGS: Brain: Generalized age-related cerebral atrophy with mild chronic microvascular ischemic disease. Remote right MCA distribution infarcts noted, most pronounced at the right parietal lobe. Few small chronic left MCA distribution infarcts noted as well. No acute intracranial hemorrhage. Previously identified acute right MCA distribution infarcts are grossly  similar, better seen on prior MRI. No significant mass effect. No other acute large vessel territory infarct. No mass lesion or midline shift. No hydrocephalus or extra-axial fluid collection. Vascular: No abnormal hyperdense vessel. Vascular stent in place within the right carotid siphon. Skull: Scalp soft tissues demonstrate no acute finding. Calvarium intact. Sinuses/Orbits: Globes and orbital soft tissues within normal limits. Paranasal sinuses remain largely clear. No significant mastoid effusion. Other: None. IMPRESSION: 1. Grossly stable appearance of previously identified acute right MCA distribution infarcts, better seen on prior MRI. No associated hemorrhage or significant mass effect. No complication status post catheter directed intervention. 2. No other new acute intracranial abnormality. 3. Underlying atrophy with chronic small vessel ischemic disease with additional remote bilateral MCA distribution infarcts, stable. Electronically Signed   By: Rise Mu M.D.   On: 06/12/2023 05:10   CT CHEST WO CONTRAST Result Date: 06/11/2023 CLINICAL DATA:  Lung nodule greater than 8 mm. History of stroke post carotid intervention and stent placement. EXAM: CT CHEST WITHOUT CONTRAST TECHNIQUE: Multidetector CT imaging of the chest was performed following the standard protocol without IV contrast.  RADIATION DOSE REDUCTION: This exam was performed according to the departmental dose-optimization program which includes automated exposure control, adjustment of the mA and/or kV according to patient size and/or use of iterative reconstruction technique. COMPARISON:  Chest radiograph 07/23/2017. CT angio head and neck 06/11/2023 FINDINGS: Cardiovascular: Normal heart size. No pericardial effusions. Normal caliber thoracic aorta. Scattered calcification in the aorta and coronary arteries. Mediastinum/Nodes: Esophagus is decompressed. No significant lymphadenopathy. Thyroid gland is unremarkable. Lungs/Pleura:  Severe emphysematous changes in the lungs with prominent bullous changes in the apices. Left apical pulmonary nodule is again demonstrated as seen on prior CT neck. Lesion measures about 1.1 x 1.6 cm in diameter and extends to the pleural surface with tenting of the pleura. Several additional scattered pulmonary nodules are demonstrated including an irregular 7 mm nodule in the posterior right upper lung, series 4, image 78. no airspace disease or consolidation. No pleural effusion or pneumothorax. Upper Abdomen: Dense opacification of the gallbladder likely representing vicarious excretion. Calcification of the aorta with evidence of probable severe calcific stenosis of the infrarenal abdominal aorta. Residual contrast material in the renal collecting systems. Musculoskeletal: Degenerative changes in the spine. No destructive bone lesions. IMPRESSION: 1. 1.1 x 1.6 cm left apical pulmonary nodule is suspicious for neoplasm. Consider biopsy versus PET-CT for further evaluation. 2. Additional pulmonary nodules including an irregular 7 mm nodule in the posterior right upper lung. This could be also followed with PET-CT. 3. Prominent emphysema. 4. Aortic atherosclerosis with evidence of calcific stenosis of the infrarenal abdominal aorta. Electronically Signed   By: Burman Nieves M.D.   On: 06/11/2023 20:39   ECHOCARDIOGRAM COMPLETE Result Date: 06/11/2023    ECHOCARDIOGRAM REPORT   Patient Name:   Henry Boone Date of Exam: 06/11/2023 Medical Rec #:  [id number]  Height:       72.0 in Accession #:    [id number] Weight:       145.0 lb Date of Birth:  10/02/1965 BSA:          1.858 m Patient Age:    57 years   BP:           161/109 mmHg Patient Gender: M          HR:           97 bpm. Exam Location:  Inpatient Procedure: 2D Echo, Color Doppler and Cardiac Doppler (Both Spectral and Color            Flow Doppler were utilized during procedure). Indications:    Stroke i63.9  History:        Patient has prior history of  Echocardiogram examinations, most                 recent 07/23/2017. Risk Factors:Hypertension, Dyslipidemia and                 ETOH.  Sonographer:    Irving Burton Senior RDCS Referring Phys: [id number] SRISHTI L BHAGAT  Sonographer Comments: Very poor echo windows, majority obtained from subcostal. Patient is supine post IR. IMPRESSIONS  1. Left ventricular ejection fraction, by estimation, is 55 to 60%. The left ventricle has normal function. The left ventricle has no regional wall motion abnormalities. Left ventricular diastolic parameters are indeterminate.  2. Right ventricular systolic function is normal. The right ventricular size is normal. Tricuspid regurgitation signal is inadequate for assessing PA pressure.  3. The mitral valve is normal in structure. No evidence of mitral valve regurgitation.  4. The aortic valve has an indeterminant  number of cusps. Aortic valve regurgitation is not visualized.  5. The inferior vena cava is normal in size with greater than 50% respiratory variability, suggesting right atrial pressure of 3 mmHg. FINDINGS  Left Ventricle: Left ventricular ejection fraction, by estimation, is 55 to 60%. The left ventricle has normal function. The left ventricle has no regional wall motion abnormalities. Strain imaging was not performed. The left ventricular internal cavity  size was normal in size. There is no left ventricular hypertrophy. Left ventricular diastolic parameters are indeterminate. Right Ventricle: The right ventricular size is normal. No increase in right ventricular wall thickness. Right ventricular systolic function is normal. Tricuspid regurgitation signal is inadequate for assessing PA pressure. Left Atrium: Left atrial size was normal in size. Right Atrium: Right atrial size was normal in size. Pericardium: Trivial pericardial effusion is present. Mitral Valve: The mitral valve is normal in structure. No evidence of mitral valve regurgitation. Tricuspid Valve: The tricuspid valve  is normal in structure. Tricuspid valve regurgitation is trivial. Aortic Valve: The aortic valve has an indeterminant number of cusps. Aortic valve regurgitation is not visualized. Pulmonic Valve: The pulmonic valve was normal in structure. Pulmonic valve regurgitation is not visualized. Aorta: The ascending aorta was not well visualized and the aortic root is normal in size and structure. Venous: The inferior vena cava is normal in size with greater than 50% respiratory variability, suggesting right atrial pressure of 3 mmHg. IAS/Shunts: No atrial level shunt detected by color flow Doppler. Additional Comments: 3D imaging was not performed.  LEFT VENTRICLE PLAX 2D LVIDd:         4.60 cm LVIDs:         3.40 cm LV PW:         1.00 cm LV IVS:        0.60 cm LVOT diam:     2.00 cm LV SV:         40 LV SV Index:   22 LVOT Area:     3.14 cm  RIGHT VENTRICLE RV S prime:     17.30 cm/s TAPSE (M-mode): 2.2 cm LEFT ATRIUM         Index LA diam:    3.30 cm 1.78 cm/m  AORTIC VALVE LVOT Vmax:   77.30 cm/s LVOT Vmean:  59.400 cm/s LVOT VTI:    0.128 m  AORTA Ao Root diam: 3.50 cm  SHUNTS Systemic VTI:  0.13 m Systemic Diam: 2.00 cm Clearnce Hasten Electronically signed by Clearnce Hasten Signature Date/Time: 06/11/2023/3:05:52 PM    Final    MR BRAIN WO CONTRAST Result Date: 06/11/2023 CLINICAL DATA:  History of prior stroke presenting with weakness and numbness to left side. Weakness and paresthesias in the left arm and slight paresthesia in the left leg. EXAM: MRI HEAD WITHOUT CONTRAST TECHNIQUE: Multiplanar, multiecho pulse sequences of the brain and surrounding structures were obtained without intravenous contrast. COMPARISON:  Same day CT head and CTA head and neck. MRI brain 07/24/2017 FINDINGS: Brain: Small scattered foci of acute infarct involving the right ACA territory near the vertex involving the medial aspect of the right precentral gyrus. Additional scattered areas of acute infarct in the right superior  parietal lobule with involvement of the postcentral gyrus. There are additional infarcts involving the right precentral gyrus in the region of the hand knob. Additional larger confluent focus of acute infarct involving the cortex and subcortical white matter in the lateral right occipital lobe. Few additional small scattered infarcts in the anterior right frontal lobe  primarily involving the ACA territory and in the right caudate. Abnormal appearance of the right ICA flow void on FLAIR images compatible with occlusion noted on CT. No abnormal susceptibility to suggest intracranial hemorrhage. Encephalomalacia and surrounding gliosis in the right parietal lobe compatible with remote infarct. No edema or mass effect. Additional remote infarcts in the left frontal cortex, left occipital cortex, and left centrum semiovale. Limited evaluation of the orbits, calvarium, and sinuses without focal abnormality appreciated. IMPRESSION: 1. Scattered acute infarcts in the right cerebral hemisphere as above. 2. Abnormal appearance of the right ICA flow void on FLAIR images compatible with occlusion noted on CT. 3. No intracranial hemorrhage. 4. Remote infarcts in the right parietal lobe, left frontal cortex, left occipital cortex, and left centrum semiovale. Electronically Signed   By: Emily Filbert M.D.   On: 06/11/2023 11:26   CT ANGIO HEAD NECK W WO CM W PERF (CODE STROKE) Result Date: 06/11/2023 CLINICAL DATA:  Stroke workup EXAM: CT ANGIOGRAPHY HEAD AND NECK CT PERFUSION BRAIN TECHNIQUE: Multidetector CT imaging of the head and neck was performed using the standard protocol during bolus administration of intravenous contrast. Multiplanar CT image reconstructions and MIPs were obtained to evaluate the vascular anatomy. Carotid stenosis measurements (when applicable) are obtained utilizing NASCET criteria, using the distal internal carotid diameter as the denominator. Multiphase CT imaging of the brain was performed  following IV bolus contrast injection. Subsequent parametric perfusion maps were calculated using RAPID software. RADIATION DOSE REDUCTION: This exam was performed according to the departmental dose-optimization program which includes automated exposure control, adjustment of the mA and/or kV according to patient size and/or use of iterative reconstruction technique. CONTRAST:  OMNIPAQUE IOHEXOL 350 MG/ML SOLN COMPARISON:  07/23/2017 FINDINGS: CTA NECK FINDINGS Aortic arch: Not covered Right carotid system: Premature atheromatous wall thickening of the common and internal carotid arteries. The right ICA is occluded beginning at the bulb, interval. Left carotid system: Heavily atheromatous common internal carotid arteries. The common carotid origin is not covered. Progressive atheromatous narrowing of the left ICA at the skull base, approximately 50%. No ulceration or beading. Vertebral arteries: Proximal subclavian atherosclerosis. Advanced narrowing of the left subclavian beyond the vertebral origin with irregular plaque that is significantly progressed. Chronic calcified plaque at the right vertebral origin with high-grade stenosis by coronal reformats. The right vertebral artery is dominant. Both vertebral arteries are patent to the dura with symmetric enhancement. No beading. Skeleton: No acute finding Other neck: No acute finding Upper chest: Paraseptal emphysema at the apices. 14 mm nodule in the subpleural left upper lobe with pleural tag appearance, concerning for tumor. Review of the MIP images confirms the above findings CTA HEAD FINDINGS Anterior circulation: The right ICA reconstitutes at the ophthalmic segment. No downstream branch occlusion is seen. There is an anterior communicating artery which feeds the left ACA and MCA in the setting of chronic left cavernous ICA occlusion. Left trigeminal artery with atheromatous plaque. Posterior circulation: Small vertebral and basilar arteries until the  basilar is accentuated from the persistent trigeminal artery on the left. The right PCA is fetal type. No major branch occlusion, beading, or aneurysm. Underfilling of the right PCA compared to the left, interval. Venous sinuses: Unremarkable for arterial timing Anatomic variants: As above Review of the MIP images confirms the above findings CT Brain Perfusion Findings: CBF (<30%) Volume: 39mL-overestimated given some mapping at the level of chronic right parietal infarct. Perfusion (Tmax>6.0s) volume: 231 cc, involving bilateral MCA distribution which is related to variant  anatomy. Mismatch Volume: Emergent findings and pulmonary nodule relayed via epic chat. IMPRESSION: 1. Emergent large vessel occlusion at the right cervical carotid. There is intracranial reconstitution at the ophthalmic segment. The right ICA feeds the bilateral anterior circulation in the setting of chronic left cavernous carotid occlusion and the right PCA in the setting of fetal type PCA circulation on the right. Ischemic perfusion is seen in the bilateral MCA territory. 15 cc area of infarct reported in the right cerebral hemisphere, although overestimated given tagging of a chronic parietal infarct. 2. Persistent trigeminal artery on the left. 3. Chronic high-grade narrowing at the right vertebral origin due to calcified plaque. 4. Advanced narrowing of the left subclavian after the vertebral origin with irregular plaque. 5. Note that the arch and great vessel ostia are not covered. 6. 50% narrowing of the distal cervical ICA on the left, progressed from 2019. 7. Emphysema with suspicious 14 mm nodule at the left upper lobe, most concerning for tumor, recommend chest CT after convalescence. Electronically Signed   By: Tiburcio Pea M.D.   On: 06/11/2023 08:42       HISTORY OF PRESENT ILLNESS 58 y.o. patient with history of hypertension, hyperlipidemia, prior right parietal stroke with residual left hemianopia, EtOH abuse and  history of cerebral artery stenosis, left carotid occlusion in 2019, right carotid stenosis admitted for new left side weakness and numbness.  NIH on Admission 4  HOSPITAL COURSE He was admitted on 06/11/2023 new onset left-sided weakness and numbness.  On arrival code Stroke  CT head revealed age-indeterminate tiny anterior right frontal cortex infarct. CTA head & neck were obtained and revealed emergent large vessel occlusion at the right cervical carotid. CT perfusion revealed ischemic perfusion is seen in the bilateral MCA territory. 15 cc area of infarct reported in the right cerebral hemisphere,  Chronic high-grade narrowing at the right vertebral origin due to calcified plaque.  He underwent interventional radiology for clued right ICA with TICI 2C revascularization, with 2 areas of arterial sclerosis related stenosis and revascularization with stent assisted angioplasty, middle cerebral artery revascularization of TICI 2C.  He was then admitted to the neuro ICU for further management.  He was hypertensive requiring Cleviprex drip for short period of time and then transitioned to p.o. medications.  BP goal should remain 120- 140.  He was restarted on on his home blood pressure medications of amlodipine 5 mg, HCTZ 25 mg, and lisinopril 20 mg however this dropped his blood pressure and HCTZ and lisinopril were discontinued.  Patient should continue on amlodipine 5 mg and monitor his blood pressure 2 times daily and follow-up with his primary care physician.   Stroke:  right MCA scattered infarcts with right cavernous ICA occlusion s/p IR and stenting with TICI2c, etiology: Large vessel disease  Code Stroke  CT head  Age-indeterminate tiny anterior right frontal cortex infarct  CTA head & neck Emergent large vessel occlusion at the right cervical carotid with distal reconstitution at ophthalmic artery level, left ICA old P3 segment occlusion, left ICA bulb 50% stenosis.  Right fetal PCA, left persistent  trigeminal artery supplying for left PCA.  Right VA origin severe stenosis. CT perfusion 15/231 cc S/p angiogram showed right cavernous ICA occlusion, status post IR and stenting, achieving TICI2c Post IR CT no hemorrhage MRI  Scattered acute infarcts in the right cerebral hemisphere 2D Echo EF 55 to 60%. Hypercoagulable panel pending LDL 68 HgbA1c 5.7 VTE prophylaxis -Lovenox aspirin 81 mg daily and clopidogrel 75 mg  daily prior to admission, now on aspirin 81 mg daily and Brilinta (ticagrelor) 90 mg bid  Therapy recommendations: Outpatient PT Disposition: Pending   Hx of Stroke/TIA 10/2014-embolic infarcts, TEE negative, no PFO, discharged on aspirin and pravastatin 40.  Residual left hemianopia 07/2017 MRI bilateral MCA infarcts, left more than right, old bilateral parietal occipital infarcts right more than left.  CTA head and neck bilateral siphon severe stenosis, cerebral angiogram showed left ICA petrous segment occlusion with distal reconstitution.  Right ICA peak reports 80% stenosis.  Bilateral VA hypoplastic.  EF 55 to 60%.  LDL 56, A1c 5.5.  Discharged on DAPT and pravastatin 40.    Hypertensive Emergency  Home meds: Amlodipine 5 mg, lisinopril 20 mg Stable now, on the low end Cleviprex turned off  Avoid low BP Recommend close BP monitoring at home Long-term BP goal normotensive   Hyperlipidemia Home meds: Pravastatin 40 mg, resumed in hospital LDL 68, goal < 70 No high intensity statin given LDL at goal Continue statin at discharge   Tobacco Abuse Patient smokes 1 packs per day      Ready to quit?  Yes Nicotine replacement therapy provided   Other Stroke Risk Factors ETOH use, alcohol level <10, advised to drink no more than 2 drink(s) a day   Other Active Problems Leukocytosis WBC 15.4-12.4, afebrile  Lung nodules on CT chest-outpt workup  DISCHARGE EXAM  PHYSICAL EXAM General:  Alert, well-nourished, well-developed patient in no acute distress Psych:  Mood  and affect appropriate for situation CV: Regular rate and rhythm on monitor Respiratory:  Regular, unlabored respirations on room air GI: Abdomen soft and nontender  NEURO:  Mental Status: AA&Ox3  Speech/Language: speech is without dysarthria or aphasia.  Naming, repetition, fluency, and comprehension intact.  Cranial Nerves:  II: PERRL. Visual fields full.  III, IV, VI: EOMI. Eyelids elevate symmetrically.  V: Sensation is intact to light touch and symmetrical to face.  VII: Smile is symmetrical.  VIII: hearing intact to voice. IX, X: Palate elevates symmetrically. Phonation is normal.  NW:GNFAOZHY shrug 5/5. XII: tongue is midline without fasciculations. Motor: 5/5 strength to all muscle groups tested.  Tone: is normal and bulk is normal Sensation- Intact to light touch bilaterally. Extinction absent to light touch to DSS. Coordination: FTN intact bilaterally, HKS: no ataxia in BLE.No drift.  Gait- deferred  NIHSS 0   Discharge Diet       Diet   Diet heart healthy/carb modified Room service appropriate? Yes; Fluid consistency: Thin   liquids  DISCHARGE PLAN Disposition: Home aspirin 81 mg daily and Brilinta (ticagrelor) 90 mg bid for secondary stroke prevention for 3 months  Ongoing stroke risk factor control by Primary Care Physician at time of discharge Follow-up PCP Marisue Ivan, MD in 2 weeks. Follow up with Dr. Corliss Skains in 2 weeks after discharge  Follow-up in [facility] in 4 weeks, office to schedule an appointment.  Able to see NP in clinic.  40 minutes were spent preparing discharge.  Gevena Mart DNP, ACNPC-AG  [facility]  ATTENDING NOTE: I reviewed above note and agree with the assessment and plan. Pt was seen and examined.   Wife at bedside.  Patient sitting in chair, neuro intact no focal deficit.  Visual fields full, no hemianopsia on exam.  Again educated on smoking cessation.  Continue DAPT with  aspirin and Brilinta as well as statin.  PT and OT recommend outpatient PT.  BP stable on the low end, will not  resume home BP meds at this time but needed close BP monitoring at home and adjusting by PCP.  Avoid low BP at home.  Patient medically stable for discharge.  Will follow-up with neurology and interventional neuroradiology.  For detailed assessment and plan, please refer to above/below as I have made changes wherever appropriate.   Marvel Plan, MD PhD Stroke Neurology 06/13/2023 6:09 PM

## 2023-06-13 NOTE — Progress Notes (Signed)
 Physical Therapy Treatment and Discharge Patient Details Name: Henry Boone MRN: [id number] DOB: September 29, 1965 Today's Date: 06/13/2023   History of Present Illness Pt is a 58 y.o. male presenting 2/25 with L sided weakness and numbness as well as worsening baseline visual deficits. S/p R common carotid arteriogram 2/25. Outside of window for TNK. 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    PT Comments  Patient agreeable to participate with therapy. Session began with reinforcing LE strengthening interventions that will address and improve balance deficiencies. Educated the patient about modifying diet/lifestyle to mitigate risks of future strokes. Under supervision, continued with gait and stair negotiation. Minimal cues to ensure safety when ascending/descending steps. Overall, the patient is progressing well and would benefit from OP Neuro PT in order to improve his strength, balance, and minimal unsteadiness when ambulating.    If plan is discharge home, recommend the following: Help with stairs or ramp for entrance;Assist for transportation;Assistance with cooking/housework;A little help with walking and/or transfers   Can travel by private vehicle        Equipment Recommendations  None recommended by PT    Recommendations for Other Services       Precautions / Restrictions Precautions Precautions: Fall Restrictions Weight Bearing Restrictions Per Provider Order: No     Mobility  Bed Mobility                    Transfers Overall transfer level: Needs assistance Equipment used: None Transfers: Sit to/from Stand Sit to Stand: Supervision           General transfer comment: Under supervision, pt able to transition from a sit to stand with no UE support to assist    Ambulation/Gait Ambulation/Gait assistance: Supervision Gait Distance (Feet): 250  Feet Assistive device: None Gait Pattern/deviations: Narrow base of support, Drifts right/left, Ataxic       General Gait Details: Ataxic gait, minimal unsteadiness during ambulation   Stairs Stairs: Yes Stairs assistance: Supervision Stair Management: No rails, One rail Left, Step to pattern Number of Stairs: 10 General stair comments: Some cues to not alternate and ascend/descend steps one at a time to ensure safety   Wheelchair Mobility     Tilt Bed    Modified Rankin (Stroke Patients Only) Modified Rankin (Stroke Patients Only) Pre-Morbid Rankin Score: No significant disability Modified Rankin: No significant disability     Balance Overall balance assessment: Needs assistance Sitting-balance support: No upper extremity supported, Feet supported Sitting balance-Leahy Scale: Good     Standing balance support: No upper extremity supported Standing balance-Leahy Scale: Good                              Communication Communication Communication: No apparent difficulties  Cognition Arousal: Alert Behavior During Therapy: WFL for tasks assessed/performed   PT - Cognitive impairments: No apparent impairments                         Following commands: Intact      Cueing Cueing Techniques: Verbal cues, Tactile cues  Exercises General Exercises - Lower Extremity Heel Raises: AROM, Both, 10 reps, Standing Other Exercises Other Exercises: 3-Way Hip x10 LLE    General Comments        Pertinent Vitals/Pain Pain Assessment Pain Assessment: No/denies pain    Home Living  Prior Function            PT Goals (current goals can now be found in the care plan section) Acute Rehab PT Goals PT Goal Formulation: With patient Time For Goal Achievement: 06/26/23 Potential to Achieve Goals: Good Progress towards PT goals: Progressing toward goals    Frequency    Min 2X/week      PT Plan       Co-evaluation              AM-PAC PT "6 Clicks" Mobility   Outcome Measure  Help needed turning from your back to your side while in a flat bed without using bedrails?: A Little Help needed moving from lying on your back to sitting on the side of a flat bed without using bedrails?: A Little Help needed moving to and from a bed to a chair (including a wheelchair)?: A Little Help needed standing up from a chair using your arms (e.g., wheelchair or bedside chair)?: None Help needed to walk in hospital room?: A Little Help needed climbing 3-5 steps with a railing? : A Little 6 Click Score: 19    End of Session Equipment Utilized During Treatment: Gait belt Activity Tolerance: Patient tolerated treatment well Patient left: in chair;with call bell/phone within reach;with chair alarm set   PT Visit Diagnosis: Hemiplegia and hemiparesis;Muscle weakness (generalized) (M62.81);Other abnormalities of gait and mobility (R26.89) Hemiplegia - Right/Left: Left Hemiplegia - dominant/non-dominant: Non-dominant Hemiplegia - caused by: Cerebral infarction     Time: 1610-9604 PT Time Calculation (min) (ACUTE ONLY): 25 min  Charges:    $Therapeutic Activity: 23-37 mins PT General Charges $$ ACUTE PT VISIT: 1 Visit                     Doreen Beam, SPT   Laycie Schriner 06/13/2023, 11:41 AM

## 2023-06-13 NOTE — Plan of Care (Signed)
  Problem: Acute Rehab PT Goals(only PT should resolve) Goal: Pt Will Go Supine/Side To Sit Description: Under Supervision Flowsheets (Taken 06/13/2023 0803) Pt will go Supine/Side to Sit: with supervision Goal: Pt Will Perform Standing Balance Or Pre-Gait Description: Improve Tandem and Single Stance  Flowsheets (Taken 06/13/2023 0803) Pt will perform standing balance or pre-gait:  with Supervision  1-2 min  with no UE support Goal: Pt Will Ambulate Description: Under supervision, ambulate 261ft without an assistive device.  Flowsheets (Taken 06/13/2023 0803) Pt will Ambulate:  > 125 feet  with supervision  with least restrictive assistive device Goal: Pt Will Go Up/Down Stairs Description: Under supervision, ascend/descend 2-3 steps with no assistive device. Flowsheets (Taken 06/13/2023 0803) Pt will Go Up / Down Stairs:  1-2 stairs  with supervision  with least restrictive assistive device

## 2023-06-13 NOTE — TOC Transition Note (Signed)
 Transition of Care [facility]) - Discharge Note   Patient Details  Name: Henry Boone MRN: [id number] Date of Birth: [birth year]  [facility] CM/SW Contact:  Glennon Mac, RN Phone Number: 06/13/2023, 11:52 AM   Clinical Narrative:    Pt is a 58 y.o. male presenting 2/25 with L sided weakness and numbness as well as worsening baseline visual deficits. S/p R common carotid arteriogram 2/25. Outside of window for TNK.  PTA, pt independent and living with significant other.  PT/OT recommending OP therapies; referral to [facility], as patient lives in [place].  OP Rehab information placed on AVS.     Final next level of care: OP Rehab Barriers to Discharge: Barriers Resolved                       Discharge Plan and Services Additional resources added to the After Visit Summary for                                       Social Drivers of Health (SDOH) Interventions SDOH Screenings   Food Insecurity: Patient Declined (06/11/2023)  Housing: Patient Declined (06/11/2023)  Transportation Needs: No Transportation Needs (06/11/2023)  Utilities: Not At Risk (06/11/2023)  Financial Resource Strain: Low Risk  (04/11/2023)   Received from [facility]  Tobacco Use: High Risk (06/11/2023)     Readmission Risk Interventions     No data to display         Quintella Baton, RN, BSN  Trauma/Neuro ICU Case Manager [phone]

## 2023-06-14 LAB — PROTEIN C, TOTAL: Protein C, Total: 103 % (ref 60–150)

## 2023-06-17 LAB — FACTOR 5 LEIDEN

## 2023-06-18 LAB — PROTHROMBIN GENE MUTATION

## 2023-06-20 ENCOUNTER — Ambulatory Visit

## 2023-06-20 ENCOUNTER — Ambulatory Visit: Attending: Family Medicine

## 2023-06-20 DIAGNOSIS — R278 Other lack of coordination: Secondary | ICD-10-CM | POA: Insufficient documentation

## 2023-06-20 DIAGNOSIS — R2681 Unsteadiness on feet: Secondary | ICD-10-CM | POA: Diagnosis present

## 2023-06-20 DIAGNOSIS — M6281 Muscle weakness (generalized): Secondary | ICD-10-CM | POA: Diagnosis present

## 2023-06-20 DIAGNOSIS — R262 Difficulty in walking, not elsewhere classified: Secondary | ICD-10-CM | POA: Insufficient documentation

## 2023-06-20 DIAGNOSIS — R41842 Visuospatial deficit: Secondary | ICD-10-CM | POA: Insufficient documentation

## 2023-06-20 NOTE — Therapy (Signed)
 OUTPATIENT PHYSICAL THERAPY NEURO EVALUATION   Patient Name: HAIM HANSSON MRN: [id number] DOB:1965/10/11, 58 y.o., male Today's Date: 06/20/2023   PCP: Marisue Ivan REFERRING PROVIDER: Marisue Ivan   END OF SESSION:  PT End of Session - 06/20/23 1446     Visit Number 1    Number of Visits 24    Date for PT Re-Evaluation 09/12/23    PT Start Time 1449    PT Stop Time 1529    PT Time Calculation (min) 40 min    Equipment Utilized During Treatment Gait belt    Activity Tolerance Patient tolerated treatment well    Behavior During Therapy WFL for tasks assessed/performed             Past Medical History:  Diagnosis Date   ETOH abuse    History of cerebral artery stenosis    Hyperlipidemia    Hypertension    Stroke [facility])    Past Surgical History:  Procedure Laterality Date   COLONOSCOPY WITH PROPOFOL N/A 01/20/2019   Procedure: COLONOSCOPY WITH PROPOFOL;  Surgeon: Toledo, Boykin Nearing, MD;  Location: [facility] ENDOSCOPY;  Service: Gastroenterology;  Laterality: N/A;   IR ANGIO EXTRACRAN SEL COM CAROTID INNOMINATE UNI BILAT MOD SED  07/23/2017   IR ANGIO INTRA EXTRACRAN SEL COM CAROTID INNOMINATE UNI R MOD SED  11/07/2017   IR ANGIO VERTEBRAL SEL SUBCLAVIAN INNOMINATE UNI R MOD SED  11/07/2017   IR ANGIO VERTEBRAL SEL VERTEBRAL BILAT MOD SED  07/23/2017   IR CT HEAD LTD  06/11/2023   IR CT HEAD LTD  06/11/2023   IR INTRA CRAN STENT  06/11/2023   IR PERCUTANEOUS ART THROMBECTOMY/INFUSION INTRACRANIAL INC DIAG ANGIO  06/11/2023   MICROLARYNGOSCOPY N/A 06/30/2019   Procedure: SUSPENSION MICROLARYNGOSCOPY WITH MICROFLAP;  Surgeon: Bud Face, MD;  Location: [facility] ORS;  Service: ENT;  Laterality: N/A;   RADIOLOGY WITH ANESTHESIA N/A 11/07/2017   Procedure: STENTING;  Surgeon: Julieanne Cotton, MD;  Location: [facility] OR;  Service: Radiology;  Laterality: N/A;   RADIOLOGY WITH ANESTHESIA N/A 06/11/2023   Procedure: IR WITH ANESTHESIA;  Surgeon: Radiologist, Medication, MD;   Location: [facility] OR;  Service: Radiology;  Laterality: N/A;   TEE WITHOUT CARDIOVERSION N/A 11/15/2014   Procedure: TRANSESOPHAGEAL ECHOCARDIOGRAM (TEE);  Surgeon: Antonieta Iba, MD;  Location: [facility] ORS;  Service: Cardiovascular;  Laterality: N/A;   TONSILLECTOMY     Patient Active Problem List   Diagnosis Date Noted   Acute cerebrovascular accident (CVA) due to occlusion of right carotid artery (HCC) 06/11/2023   Internal carotid artery occlusion, right 06/11/2023   Carotid occlusion, left 07/25/2017   Carotid stenosis, right 07/25/2017   History of ischemic multifocal multiple vascular territories stroke 07/25/2017   Essential hypertension 07/25/2017   Hyperlipidemia 07/25/2017   Tobacco use disorder 07/25/2017   Leukocytosis 07/25/2017   Cerebrovascular accident (CVA) due to bilateral occlusion of middle cerebral arteries (HCC) 07/23/2017   Cerebral thrombosis with cerebral infarction (HCC) 11/12/2014   Amaurosis fugax 11/11/2014    ONSET DATE: 06/11/23  REFERRING DIAG: CVA  THERAPY DIAG:  Unsteadiness on feet  Muscle weakness (generalized)  Difficulty in walking, not elsewhere classified  Rationale for Evaluation and Treatment: Rehabilitation  SUBJECTIVE:  SUBJECTIVE STATEMENT: Patient presents for evaluation s/p CVA; Pt accompanied by: self  PERTINENT HISTORY: Patient presents for evaluation s/p CVA; occlusion of R carotid artery.  Patient was hospitalized for a stroke on Feb 25th, 2025 and discharged Feb 27th. A stent was placed in R carotid artery.  R MCA territory stroke noted. PMH includes HTN, tobacco dependence, stroke (2016), squamous cell carcinoma of vocal cord (2022), Lcarotid occlusion (2019), R carotid stenosis, prior R parietal and septal ischemic infarcts with resiodual mild  hemianopia. 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   PAIN:  Are you having pain? No  PRECAUTIONS: Fall  RED FLAGS: None   WEIGHT BEARING RESTRICTIONS: No; no lifting >5lb due to stent  FALLS: Has patient fallen in last 6 months? Yes. 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   PATIENT GOALS: continue to do the things I enjoy doing.   OBJECTIVE:  Note: Objective measures were completed at Evaluation unless otherwise noted.  DIAGNOSTIC FINDINGS:  06/11/23  IMPRESSION: 1. Emergent large vessel occlusion at the right cervical carotid. There is intracranial reconstitution at the ophthalmic segment. The right ICA feeds the bilateral anterior circulation in the setting of chronic left cavernous carotid occlusion and the right PCA in the setting of fetal type PCA circulation on the right. Ischemic perfusion is seen in the bilateral MCA territory. 15 cc area of infarct reported in the right cerebral hemisphere, although overestimated given tagging of a chronic parietal infarct. 2. Persistent trigeminal artery on the left. 3. Chronic high-grade narrowing at the right vertebral origin due to calcified plaque. 4. Advanced narrowing of the left subclavian after the vertebral origin with irregular plaque. 5. Note that the arch and great vessel ostia are not covered. 6. 50% narrowing of the distal cervical ICA on the left, progressed from 2019. 7. Emphysema with suspicious 14 mm nodule at the left upper lobe, most concerning for tumor, recommend chest CT after convalescence.  COGNITION: Overall cognitive status:  short term memory   SENSATION: Light touch: Impaired ; decreased LLE   COORDINATION: Finger nose test: relatively equal  coordination of UE Heel shin test: decreased coordination of LLE  POSTURE: rounded shoulders  LOWER EXTREMITY ROM:    WFL  LOWER EXTREMITY MMT:    MMT Right Eval Left Eval  Hip flexion 7.8 4.9  Hip extension    Hip abduction 8.5 4.1  Hip adduction 6.9 3.1  Hip internal rotation    Hip external rotation    Knee flexion 8.9 8.2  Knee extension 9.5 8.2  Ankle dorsiflexion 10.4 8.3  Ankle plantarflexion 12.8 10.8  Ankle inversion    Ankle eversion    (Blank rows = not tested)    TRANSFERS: Assistive device utilized: None  Sit to stand: SBA Stand to sit: SBA Chair to chair: SBA     STAIRS: Level of Assistance: CGA Stair Negotiation Technique: Alternating Pattern  with Single Rail on Right Number of Stairs: 5  Height of Stairs: 6"  Comments: limited spatial awareness of LLE  GAIT: Gait pattern: decreased arm swing- Left, decreased stance time- Left, decreased ankle dorsiflexion- Left, and circumduction- Left Distance walked: 60 Assistive device utilized: None Level of assistance: SBA and CGA Comments: snake type patterning of L foot  with slight abduction  FUNCTIONAL TESTS:  5 times sit to stand: 16.52 10 meter walk test: 9.07 seconds  Berg Balance Scale: performed FGA instead FGA: 18/30   [facility] PT Assessment - 06/20/23 0001       Functional Gait  Assessment   Gait assessed  Yes    Gait Level Surface Walks 20 ft in less than 5.5 sec, no assistive devices, good speed, no evidence for imbalance, normal gait pattern, deviates no more than 6 in outside of the 12 in walkway width.    Change in Gait Speed Able to change speed, demonstrates mild gait deviations, deviates 6-10 in outside of the 12 in walkway width, or no gait deviations, unable to achieve a major change in velocity, or uses a change in velocity, or uses an assistive device.    Gait with Horizontal Head Turns Performs head turns with moderate changes in gait velocity, slows down, deviates 10-15 in  outside 12 in walkway width but recovers, can continue to walk.    Gait with Vertical Head Turns Performs task with moderate change in gait velocity, slows down, deviates 10-15 in outside 12 in walkway width but recovers, can continue to walk.    Gait and Pivot Turn Pivot turns safely in greater than 3 sec and stops with no loss of balance, or pivot turns safely within 3 sec and stops with mild imbalance, requires small steps to catch balance.    Step Over Obstacle Is able to step over one shoe box (4.5 in total height) without changing gait speed. No evidence of imbalance.    Gait with Narrow Base of Support Ambulates 7-9 steps.    Gait with Eyes Closed Walks 20 ft, slow speed, abnormal gait pattern, evidence for imbalance, deviates 10-15 in outside 12 in walkway width. Requires more than 9 sec to ambulate 20 ft.    Ambulating Backwards Walks 20 ft, uses assistive device, slower speed, mild gait deviations, deviates 6-10 in outside 12 in walkway width.    Steps Alternating feet, must use rail.    Total Score 18             [facility] PT Assessment - 06/20/23 0001       Functional Gait  Assessment   Gait assessed  Yes    Gait Level Surface Walks 20 ft in less than 5.5 sec, no assistive devices, good speed, no evidence for imbalance, normal gait pattern, deviates no more than 6 in outside of the 12 in walkway width.    Change in Gait Speed Able to change speed, demonstrates mild gait deviations, deviates 6-10 in outside of the 12 in walkway width, or no gait deviations, unable to achieve a major change in velocity, or uses a change in velocity, or uses an assistive device.    Gait with Horizontal Head Turns Performs head turns with moderate changes in gait velocity, slows down, deviates 10-15 in outside 12 in walkway width but recovers, can continue to walk.    Gait with Vertical Head Turns Performs task with moderate change in gait velocity, slows down, deviates 10-15 in outside 12 in walkway width  but recovers, can continue to walk.    Gait and Pivot Turn Pivot turns safely in greater than 3 sec and stops with no loss of balance, or pivot turns safely within 3 sec and stops with mild imbalance, requires small steps to catch balance.    Step Over Obstacle Is able to step over one shoe box (4.5 in  total height) without changing gait speed. No evidence of imbalance.    Gait with Narrow Base of Support Ambulates 7-9 steps.    Gait with Eyes Closed Walks 20 ft, slow speed, abnormal gait pattern, evidence for imbalance, deviates 10-15 in outside 12 in walkway width. Requires more than 9 sec to ambulate 20 ft.    Ambulating Backwards Walks 20 ft, uses assistive device, slower speed, mild gait deviations, deviates 6-10 in outside 12 in walkway width.    Steps Alternating feet, must use rail.    Total Score 18              PATIENT SURVEYS:  LEFS 66                                                                                                                              TREATMENT DATE: 06/20/23 Eval + HEP   PATIENT EDUCATION: Education details: goals, POC  Person educated: Patient Education method: Explanation, Demonstration, Actor cues, and Verbal cues Education comprehension: verbalized understanding, returned demonstration, verbal cues required, tactile cues required, and needs further education  HOME EXERCISE PROGRAM: HEP: Access Code: [email or web address] URL: [email or web address] Date: 06/20/2023 Prepared by: Precious Bard  Exercises - Seated Ankle Alphabet  - 1 x daily - 7 x weekly - 2 sets - 1 reps - 5 hold - Seated Marching with Opposite Shoulder Flexion  - 1 x daily - 7 x weekly - 2 sets - 10 reps - 5 hold  GOALS: Goals reviewed with patient? Yes  SHORT TERM GOALS: Target date: 07/04/2023   Patient will be independent in home exercise program to improve strength/mobility for better functional independence with ADLs. Baseline: Goal status:  INITIAL    LONG TERM GOALS: Target date: 09/12/2023  Patient will increase Functional Gait Assessment score to >26/30 as to reduce fall risk and improve dynamic gait safety with community ambulation. Baseline: 3/6: 18/56 Goal status: INITIAL  2.  Patient will increase lower extremity functional scale to >75/80 to demonstrate improved functional mobility and increased tolerance with ADLs.  Baseline:  3/6: 66/80  Goal status: INITIAL  3.  Patient will increase BLE gross strength to 4+/5  and/or within 2lb of force as primary limb as to improve functional strength for independent gait, increased standing tolerance and increased ADL ability. Baseline: 3/6: see above Goal status: INITIAL  4.   Patient (< 19 years old) will complete five times sit to stand test in < 10 seconds indicating an increased LE strength and improved balance. Baseline: 16.52 Goal status: INITIAL    ASSESSMENT:  CLINICAL IMPRESSION: Patient is a 58 y.o. male who was seen today for physical therapy evaluation and treatment for CVA. Patient is challenged with visual fields as well as coordination. Upon challenging vision with head movements or closing visual field patient has increased circumduction of LLE and flexion of LUE. Coordination of LLE will be a focus in addition to stability at this time. Patient  has decreased strength of LLE. Patient will benefit from skilled physical therapy to improve stability, mobility, and return to PLOF.   OBJECTIVE IMPAIRMENTS: Abnormal gait, cardiopulmonary status limiting activity, decreased activity tolerance, decreased balance, decreased coordination, decreased endurance, decreased mobility, difficulty walking, decreased strength, impaired perceived functional ability, impaired sensation, impaired vision/preception, improper body mechanics, and postural dysfunction.   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   are also affecting patient's functional outcome.   REHAB POTENTIAL: Good  CLINICAL DECISION MAKING: Evolving/moderate complexity  EVALUATION COMPLEXITY: Moderate  PLAN:  PT FREQUENCY: 2x/week  PT DURATION: 12 weeks  PLANNED INTERVENTIONS: 97164- PT Re-evaluation, 97110-Therapeutic exercises, 97530- Therapeutic activity, 97112- Neuromuscular re-education, 97535- Self Care, 16109- Manual therapy, [phone]- Gait training, [phone]- Orthotic Fit/training, [phone]- Canalith repositioning, [id number]- Electrical stimulation (unattended), [phone]- Electrical stimulation (manual), [id number]- Ultrasound, [id number]- Traction (mechanical), [id number]- Ionotophoresis 4mg /ml Dexamethasone, Patient/Family education, Balance training, Stair training, Taping, Dry Needling, Joint mobilization, Spinal mobilization, Scar mobilization, Compression bandaging, Vestibular training, Visual/preceptual remediation/compensation, Cognitive remediation, DME instructions, Cryotherapy, Moist heat, and Biofeedback  PLAN FOR NEXT SESSION: airex pad, dual task, coordination, strength   Precious Bard, PT 06/20/2023, 5:35 PM

## 2023-06-20 NOTE — Therapy (Signed)
 OUTPATIENT OCCUPATIONAL THERAPY NEURO EVALUATION  Patient Name: Henry Boone MRN: [id number] DOB:May 12, 1965, 58 y.o., male Today's Date: 06/22/2023  PCP: Dr. Burnadette Pop REFERRING PROVIDER: Debbra Riding, PA-C  END OF SESSION:  OT End of Session - 06/22/23 1526     Visit Number 1    Number of Visits 24    Date for OT Re-Evaluation 09/12/23    Progress Note Due on Visit 10    OT Start Time 1400    OT Stop Time 1445    OT Time Calculation (min) 45 min    Activity Tolerance Patient tolerated treatment well    Behavior During Therapy [facility] for tasks assessed/performed            Past Medical History:  Diagnosis Date   ETOH abuse    History of cerebral artery stenosis    Hyperlipidemia    Hypertension    Stroke [facility])    Past Surgical History:  Procedure Laterality Date   COLONOSCOPY WITH PROPOFOL N/A 01/20/2019   Procedure: COLONOSCOPY WITH PROPOFOL;  Surgeon: Toledo, Boykin Nearing, MD;  Location: [facility] ENDOSCOPY;  Service: Gastroenterology;  Laterality: N/A;   IR ANGIO EXTRACRAN SEL COM CAROTID INNOMINATE UNI BILAT MOD SED  07/23/2017   IR ANGIO INTRA EXTRACRAN SEL COM CAROTID INNOMINATE UNI R MOD SED  11/07/2017   IR ANGIO VERTEBRAL SEL SUBCLAVIAN INNOMINATE UNI R MOD SED  11/07/2017   IR ANGIO VERTEBRAL SEL VERTEBRAL BILAT MOD SED  07/23/2017   IR CT HEAD LTD  06/11/2023   IR CT HEAD LTD  06/11/2023   IR INTRA CRAN STENT  06/11/2023   IR PERCUTANEOUS ART THROMBECTOMY/INFUSION INTRACRANIAL INC DIAG ANGIO  06/11/2023   MICROLARYNGOSCOPY N/A 06/30/2019   Procedure: SUSPENSION MICROLARYNGOSCOPY WITH MICROFLAP;  Surgeon: Bud Face, MD;  Location: [facility] ORS;  Service: ENT;  Laterality: N/A;   RADIOLOGY WITH ANESTHESIA N/A 11/07/2017   Procedure: STENTING;  Surgeon: Julieanne Cotton, MD;  Location: [facility] OR;  Service: Radiology;  Laterality: N/A;   RADIOLOGY WITH ANESTHESIA N/A 06/11/2023   Procedure: IR WITH ANESTHESIA;  Surgeon: Radiologist, Medication, MD;  Location: [facility] OR;  Service:  Radiology;  Laterality: N/A;   TEE WITHOUT CARDIOVERSION N/A 11/15/2014   Procedure: TRANSESOPHAGEAL ECHOCARDIOGRAM (TEE);  Surgeon: Antonieta Iba, MD;  Location: [facility] ORS;  Service: Cardiovascular;  Laterality: N/A;   TONSILLECTOMY     Patient Active Problem List   Diagnosis Date Noted   Acute cerebrovascular accident (CVA) due to occlusion of right carotid artery (HCC) 06/11/2023   Internal carotid artery occlusion, right 06/11/2023   Carotid occlusion, left 07/25/2017   Carotid stenosis, right 07/25/2017   History of ischemic multifocal multiple vascular territories stroke 07/25/2017   Essential hypertension 07/25/2017   Hyperlipidemia 07/25/2017   Tobacco use disorder 07/25/2017   Leukocytosis 07/25/2017   Cerebrovascular accident (CVA) due to bilateral occlusion of middle cerebral arteries (HCC) 07/23/2017   Cerebral thrombosis with cerebral infarction (HCC) 11/12/2014   Amaurosis fugax 11/11/2014   ONSET DATE: 06/11/23  REFERRING DIAG: I63.9 (ICD-10-CM) - Cerebral infarction, unspecified   THERAPY DIAG:  Muscle weakness (generalized)  Other lack of coordination  Visual-spatial impairment  Rationale for Evaluation and Treatment: Rehabilitation  SUBJECTIVE:   SUBJECTIVE STATEMENT: Pt reports he's been doing things out of order, having to make his coffee 5x.  Pt also reports that he feels like he's looking at things backward and struggling to understand how to position things (ie his spoon or cup) for proper use.  Pt accompanied by:  self  PERTINENT HISTORY: Per medical record from MD visit on 06/18/23: History of Present Illness Henry Boone is a 58 year old male with a history of stroke who presents for follow-up after recent hospitalization for another stroke.  He was hospitalized for a stroke on February 25th and discharged on February 27th. During the hospital stay, a stent was placed in the right carotid artery due to stenosis. Right MCA territory stroke noted due to  stenosis of the carotid artery he continues to experience numbness on the left side of his body, which has improved somewhat. His upper left leg feels normal, but he describes a sensation of walking on a pillow. His arm feels almost normal, but he experiences persistent numbness from the side of his neck down to his shoulder, which he finds frustrating.  He is currently on new medications, including amlodipine, which he has been taking for a week. His blood pressure has never been lower, averaging 117/79, and he experiences no side effects. Previously, with encephalol, he experienced lightheadedness upon standing after sitting for long periods.  He has a history of carotid artery stenosis and is scheduled for follow-up appointments with radiology and neurology. He has not yet scheduled rehabilitation therapy.  He reports some short-term memory issues and increased irritability, which he attributes to frustration. He describes difficulty with tasks such as making coffee and feeding his dogs due to coordination issues. He has not been using his computer, which was a regular pastime, and has been taking naps in the afternoon, which is unusual for him.   Per chart, pt also reports difficulty with tasks such as using a spoon.  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   PRECAUTIONS: None  WEIGHT BEARING RESTRICTIONS: No  PAIN:  Are you having pain? No  FALLS: Has patient fallen in last 6 months? Yes. Number of falls at least 2 (pt attributes it to numbness from CVA)  LIVING ENVIRONMENT: Lives with: lives with their spouse Lives in: 1 level home Stairs: Yes: External: 2 steps; none Has following equipment at home: None  PLOF: Independent, works full time as a Scientist, physiological.  Enjoys cooking.    PATIENT GOALS: "I want to make sure I'm back to normal or as close as I can be."   OBJECTIVE:  Note:  Objective measures were completed at Evaluation unless otherwise noted.  HAND DOMINANCE: Ambidextrous, but favors the L.    ADLs: Overall ADLs: Pt describes increased time with visual/spatial components of ADLs and some difficulties with sequencing Transfers/ambulation related to ADLs: indep Eating: Able to cut food; though reports struggling to understand which way to turn his spoon the other day, and tipping his coffee cup away from him and spilling rather than tipping it toward his mouth to take a sip Grooming: not yet doing his own shaving d/t fear of error or cutting  UB Dressing: difficulty trying to "figure out how to get the L arm in the sleeve." LB Dressing: indep, reporting no difficulties Toileting: indep Bathing: indep; reports no difficulties with sequencing through steps of bathing Tub Shower transfers: increased caution to step over tub d/t LLE paresthesias Equipment: none  IADLs: words on shirt appeared backwards, spoon appeared upside down  Shopping: Not yet attempted Light housekeeping: indep Meal Prep: spouse currently managing; pt is typically the one who cooks at baseline Community mobility: spouse currently driving Medication management:  indep Financial management: indep  Handwriting: Increased time  MOBILITY STATUS: indep without AD; see PT eval for additional details  POSTURE COMMENTS:  No Significant postural limitations  ACTIVITY TOLERANCE: Activity tolerance: WFL for tasks assessed  FUNCTIONAL OUTCOME MEASURES: Plan to complete MVPT in upcoming session   UPPER EXTREMITY ROM:  WNL  UPPER EXTREMITY MMT:   5/5  HAND FUNCTION: Grip strength: Right: 79 lbs; Left: 76 lbs, Lateral pinch: Right: 26 lbs, Left: 24 lbs, and 3 point pinch: Right: 18 lbs, Left: 14 lbs  COORDINATION: 9 Hole Peg test: Right: 33 sec; Left: 39 sec  SENSATION: Pt reports numbness in L leg, L hip, L side of torso (anterior and posterior); hx of numbness in ulnar digits of R hand  from previous CVA in 2019.  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  Pt also reports struggling to figure out how to put his L arm into his shirt sleeve, and when reading the words on a shirt that his wife held up for him, pt asked spouse why the letters had been reversed.  Patient has difficulty with following activities due to following visual impairments: dressing, meal prep, self feeding  PERCEPTION: Impaired: Body scheme: TBD and Spatial orientation: TBD; Further assessment needed  PRAXIS: Impaired: Motor planning; mild LUE apraxia and ataxia                                                                                                                     TREATMENT DATE: 06/20/23: Evaluation completed.  PATIENT EDUCATION: Education details: OT role, goals, poc Person educated: Patient Education method: Explanation Education comprehension: verbalized understanding  HOME EXERCISE PROGRAM: To be initiated in upcoming sessions  GOALS: Goals reviewed with patient? Yes  SHORT TERM GOALS: Target date: 08/01/23  Pt will be indep to perform HEP for improving LUE GMC/FMC skills. Baseline: Eval: HEP not yet initiated Goal status: INITIAL  LONG TERM GOALS: Target date: 09/12/23  Pt will be  indep to correctly sequence a 3-4 step task to enable indep with making a cup of coffee. Baseline: Eval: Pt reports he couldn't figure out how to sequence making his coffee, and this task required 5 attempts before successful completion Goal status: INITIAL  2.   Pt will increase L hand [facility] skills in order to increase typing speed for work efficiency. Baseline: Eval:  Will assess typing speed in upcoming session Goal status: INITIAL  3.   Pt will complete MVPT to identify more specific areas of visual processing deficits. Baseline: Eval: Plan to complete next session Goal status: INITIAL  4.   Pt will utilize visual compensatory strategies as needed in order to don a shirt with modified indep and good efficiency. Baseline: Eval: Pt describes difficulty figuring out how to thread his L arm into his shirt sleeve Goal status: INITIAL  ASSESSMENT:  CLINICAL IMPRESSION: Patient is a 58 y.o. male who was seen today for occupational therapy evaluation for functional decline related to R CVA.  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  Pt describes visual processing deficits, possibly showing ideational apraxia or limb-kinetic apraxia; further assessment needed in upcoming visits.  These visual processing and reported sequencing impairments currently impacting independence and efficiency with donning clothes, self feeding, and meal prep.  Prior to this CVA, pt worked full time as a Scientist, physiological, which requires extensive computer use. Pt's goal is to return to PLOF, including working.  Pt will benefit from skilled OT in order to address above noted motor apraxia, ataxia, and visual processing deficits in order to maximize indep with ADL/IADL/work tasks.    PERFORMANCE DEFICITS: in functional skills including ADLs, IADLs, coordination, dexterity, sensation, Fine motor control, Gross motor control, vision, and UE functional use, cognitive skills  including memory, perception, problem solving, and sequencing, and psychosocial skills including coping strategies, environmental adaptation, habits, and routines and behaviors.   IMPAIRMENTS: are limiting patient from ADLs, IADLs, work, and leisure.   CO-MORBIDITIES: has co-morbidities such as 2 previous CVAs, hx ETOH abuse, HTN  that affects occupational performance. Patient will benefit from skilled OT to address above impairments and improve overall function.  MODIFICATION OR ASSISTANCE TO COMPLETE EVALUATION: No modification of tasks or assist necessary to complete an evaluation.  OT OCCUPATIONAL PROFILE AND HISTORY: Detailed assessment: Review of records and additional review of physical, cognitive, psychosocial history related to current functional performance.  CLINICAL DECISION MAKING: Moderate - several treatment options, min-mod task modification necessary  REHAB POTENTIAL: Good  EVALUATION COMPLEXITY: Moderate    PLAN:  OT FREQUENCY: 2x/week  OT DURATION: 12 weeks  PLANNED INTERVENTIONS: 97168 OT Re-evaluation, 97535 self care/ADL training, 16109 therapeutic exercise, 97530 therapeutic activity, 97112 neuromuscular re-education, [id number] Cognitive training (first 15 min), 60454 Cognitive training(each additional 15 min), visual/perceptual remediation/compensation, psychosocial skills training, coping strategies training, patient/family education, and DME and/or AE instructions  RECOMMENDED OTHER SERVICES: None at this time (PT evaluation this date)  CONSULTED AND AGREED WITH PLAN OF CARE: Patient  PLAN FOR NEXT SESSION: See above   Danelle Earthly, MS, OTR/L   Otis Dials, OT 06/22/2023, 3:28 PM

## 2023-06-25 ENCOUNTER — Ambulatory Visit: Admitting: Occupational Therapy

## 2023-06-25 ENCOUNTER — Ambulatory Visit: Admitting: Physical Therapy

## 2023-06-25 DIAGNOSIS — R262 Difficulty in walking, not elsewhere classified: Secondary | ICD-10-CM

## 2023-06-25 DIAGNOSIS — M6281 Muscle weakness (generalized): Secondary | ICD-10-CM | POA: Diagnosis not present

## 2023-06-25 DIAGNOSIS — R41842 Visuospatial deficit: Secondary | ICD-10-CM

## 2023-06-25 DIAGNOSIS — R2681 Unsteadiness on feet: Secondary | ICD-10-CM

## 2023-06-25 DIAGNOSIS — R278 Other lack of coordination: Secondary | ICD-10-CM

## 2023-06-25 NOTE — Therapy (Signed)
 OUTPATIENT OCCUPATIONAL THERAPY NEURO TREATMENT NOTE  Patient Name: Henry Boone MRN: [id number] DOB:05-03-1965, 58 y.o., male Today's Date: 06/25/2023  PCP: Dr. Burnadette Pop REFERRING PROVIDER: Debbra Riding, PA-C  END OF SESSION:  OT End of Session - 06/25/23 1036     Visit Number 2    Number of Visits 24    Date for OT Re-Evaluation 09/12/23    OT Start Time 0930    OT Stop Time 1015    OT Time Calculation (min) 45 min    Activity Tolerance Patient tolerated treatment well    Behavior During Therapy [facility] for tasks assessed/performed            Past Medical History:  Diagnosis Date   ETOH abuse    History of cerebral artery stenosis    Hyperlipidemia    Hypertension    Stroke [facility])    Past Surgical History:  Procedure Laterality Date   COLONOSCOPY WITH PROPOFOL N/A 01/20/2019   Procedure: COLONOSCOPY WITH PROPOFOL;  Surgeon: Toledo, Boykin Nearing, MD;  Location: [facility] ENDOSCOPY;  Service: Gastroenterology;  Laterality: N/A;   IR ANGIO EXTRACRAN SEL COM CAROTID INNOMINATE UNI BILAT MOD SED  07/23/2017   IR ANGIO INTRA EXTRACRAN SEL COM CAROTID INNOMINATE UNI R MOD SED  11/07/2017   IR ANGIO VERTEBRAL SEL SUBCLAVIAN INNOMINATE UNI R MOD SED  11/07/2017   IR ANGIO VERTEBRAL SEL VERTEBRAL BILAT MOD SED  07/23/2017   IR CT HEAD LTD  06/11/2023   IR CT HEAD LTD  06/11/2023   IR INTRA CRAN STENT  06/11/2023   IR PERCUTANEOUS ART THROMBECTOMY/INFUSION INTRACRANIAL INC DIAG ANGIO  06/11/2023   MICROLARYNGOSCOPY N/A 06/30/2019   Procedure: SUSPENSION MICROLARYNGOSCOPY WITH MICROFLAP;  Surgeon: Bud Face, MD;  Location: [facility] ORS;  Service: ENT;  Laterality: N/A;   RADIOLOGY WITH ANESTHESIA N/A 11/07/2017   Procedure: STENTING;  Surgeon: Julieanne Cotton, MD;  Location: [facility] OR;  Service: Radiology;  Laterality: N/A;   RADIOLOGY WITH ANESTHESIA N/A 06/11/2023   Procedure: IR WITH ANESTHESIA;  Surgeon: Radiologist, Medication, MD;  Location: [facility] OR;  Service: Radiology;  Laterality: N/A;    TEE WITHOUT CARDIOVERSION N/A 11/15/2014   Procedure: TRANSESOPHAGEAL ECHOCARDIOGRAM (TEE);  Surgeon: Antonieta Iba, MD;  Location: [facility] ORS;  Service: Cardiovascular;  Laterality: N/A;   TONSILLECTOMY     Patient Active Problem List   Diagnosis Date Noted   Acute cerebrovascular accident (CVA) due to occlusion of right carotid artery (HCC) 06/11/2023   Internal carotid artery occlusion, right 06/11/2023   Carotid occlusion, left 07/25/2017   Carotid stenosis, right 07/25/2017   History of ischemic multifocal multiple vascular territories stroke 07/25/2017   Essential hypertension 07/25/2017   Hyperlipidemia 07/25/2017   Tobacco use disorder 07/25/2017   Leukocytosis 07/25/2017   Cerebrovascular accident (CVA) due to bilateral occlusion of middle cerebral arteries (HCC) 07/23/2017   Cerebral thrombosis with cerebral infarction (HCC) 11/12/2014   Amaurosis fugax 11/11/2014   ONSET DATE: 06/11/23  REFERRING DIAG: I63.9 (ICD-10-CM) - Cerebral infarction, unspecified   THERAPY DIAG:  Muscle weakness (generalized)  Rationale for Evaluation and Treatment: Rehabilitation  SUBJECTIVE:   SUBJECTIVE STATEMENT:  Pt. reports that things went fine with returning to work. Pt. Reports no difficulties with it. Pt accompanied by: self  PERTINENT HISTORY: Per medical record from MD visit on 06/18/23: History of Present Illness Henry Boone is a 58 year old male with a history of stroke who presents for follow-up after recent hospitalization for another stroke.  He was hospitalized for  a stroke on February 25th and discharged on February 27th. During the hospital stay, a stent was placed in the right carotid artery due to stenosis. Right MCA territory stroke noted due to stenosis of the carotid artery he continues to experience numbness on the left side of his body, which has improved somewhat. His upper left leg feels normal, but he describes a sensation of walking on a pillow. His arm feels  almost normal, but he experiences persistent numbness from the side of his neck down to his shoulder, which he finds frustrating.  He is currently on new medications, including amlodipine, which he has been taking for a week. His blood pressure has never been lower, averaging 117/79, and he experiences no side effects. Previously, with encephalol, he experienced lightheadedness upon standing after sitting for long periods.  He has a history of carotid artery stenosis and is scheduled for follow-up appointments with radiology and neurology. He has not yet scheduled rehabilitation therapy.  He reports some short-term memory issues and increased irritability, which he attributes to frustration. He describes difficulty with tasks such as making coffee and feeding his dogs due to coordination issues. He has not been using his computer, which was a regular pastime, and has been taking naps in the afternoon, which is unusual for him.   Per chart, pt also reports difficulty with tasks such as using a spoon.  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   PRECAUTIONS: None  WEIGHT BEARING RESTRICTIONS: No  PAIN:  Are you having pain? No  FALLS: Has patient fallen in last 6 months? Yes. Number of falls at least 2 (pt attributes it to numbness from CVA)  LIVING ENVIRONMENT: Lives with: lives with their spouse Lives in: 1 level home Stairs: Yes: External: 2 steps; none Has following equipment at home: None  PLOF: Independent, works full time as a Scientist, physiological.  Enjoys cooking.    PATIENT GOALS: "I want to make sure I'm back to normal or as close as I can be."   OBJECTIVE:  Note: Objective measures were completed at Evaluation unless otherwise noted.  HAND DOMINANCE: Ambidextrous, but favors the L.    ADLs: Overall ADLs: Pt describes increased time with visual/spatial components of ADLs and some  difficulties with sequencing Transfers/ambulation related to ADLs: indep Eating: Able to cut food; though reports struggling to understand which way to turn his spoon the other day, and tipping his coffee cup away from him and spilling rather than tipping it toward his mouth to take a sip Grooming: not yet doing his own shaving d/t fear of error or cutting  UB Dressing: difficulty trying to "figure out how to get the L arm in the sleeve." LB Dressing: indep, reporting no difficulties Toileting: indep Bathing: indep; reports no difficulties with sequencing through steps of bathing Tub Shower transfers: increased caution to step over tub d/t LLE paresthesias Equipment: none  IADLs: words on shirt appeared backwards, spoon appeared upside down  Shopping: Not yet attempted Light housekeeping: indep Meal Prep: spouse currently managing; pt is typically the one who cooks at baseline Community mobility: spouse currently driving Medication management: indep Landscape architect: indep  Handwriting: Increased time  MOBILITY STATUS: indep without AD; see PT eval for additional details  POSTURE COMMENTS:  No Significant postural limitations  ACTIVITY TOLERANCE: Activity tolerance: WFL for tasks assessed  FUNCTIONAL OUTCOME MEASURES: Plan to complete  MVPT in upcoming session   UPPER EXTREMITY ROM:  WNL  UPPER EXTREMITY MMT:   5/5  HAND FUNCTION: Grip strength: Right: 79 lbs; Left: 76 lbs, Lateral pinch: Right: 26 lbs, Left: 24 lbs, and 3 point pinch: Right: 18 lbs, Left: 14 lbs  COORDINATION: 9 Hole Peg test: Right: 33 sec; Left: 39 sec  SENSATION: Pt reports numbness in L leg, L hip, L side of torso (anterior and posterior); hx of numbness in ulnar digits of R hand from previous CVA in 2019.  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  Pt also reports struggling to figure out how to put his L arm into his shirt sleeve, and when reading the words on a shirt that his wife held up for him, pt asked spouse why the letters had been reversed.  Patient has difficulty with following activities due to following visual impairments: dressing, meal prep, self feeding  PERCEPTION: Impaired: Body scheme: TBD and Spatial orientation: TBD; Further assessment needed  PRAXIS: Impaired: Motor planning; mild LUE apraxia and ataxia                                                                                                                     TREATMENT DATE: 06/25/23  Therapeutic Activities:   -MVPT  administered with 35/36 accurate responses. Pt. was able to independently initiate correction of the one  misidentified response. (Pt. Presented with 22/21 left sided responses, and 14/15 right sided responses). Total visual processing time: 2 min. & 15 sec.  -Completed The Letter reversal grid efficiently with accurately completing 56/56 items with 100% accuracy. -Completed The directional arrow worksheet  efficiently with 100% accuracy for 36/36 verbal and motor responses -Completed Flip-Flop Targets worksheet for mirror images in 4 quadrants. Accurately completed 10/10 mirror images from the left to the right upper quadrant  with 100% accuracy. Accurately completed 6/10 mirror images from the upper left  to the bottom left. Accurately completed 6/10 mirror images from the upper left to the lower right quadrant.  There. Ex:  Theraputty hand strengthening exercises using green level resistive Theraputty including: gross gripping, gross digit extension, lateral, and 3pt. pinch strengthening, thumb opposition, bilateral alternating twisting motion in opposite directions with supination/pronation, rolling circular spheres within the tips of the fingers, and manipulating putty within the tips of fingers to remove small objects.  Pt. was provided with a visual handout HEP through [employer] with a video access code.     PATIENT EDUCATION: Education details: OT role, goals, poc Person educated: Patient Education method: Explanation Education comprehension: verbalized understanding  HOME EXERCISE PROGRAM: To be initiated in upcoming sessions  GOALS: Goals reviewed with patient? Yes  SHORT TERM GOALS: Target date: 08/01/23  Pt will be indep to perform HEP for improving LUE [facility] skills. Baseline: Eval: HEP not yet initiated Goal status: INITIAL  LONG TERM GOALS: Target date: 09/12/23  Pt will be indep to correctly sequence a 3-4 step task to enable indep with making a cup of coffee. Baseline: Eval: Pt reports he couldn't figure out how to sequence making his coffee, and this task required 5 attempts before successful completion Goal status: INITIAL  2.   Pt will increase L hand [facility] skills in order to increase typing speed for work efficiency. Baseline: Eval: Will assess typing speed in upcoming session Goal status: INITIAL  3.   Pt will complete MVPT to identify more specific areas of visual processing deficits. Baseline: Eval: Plan to complete next session Goal status: INITIAL  4.   Pt will utilize visual compensatory strategies as needed in order to don a shirt with modified indep and good efficiency. Baseline: Eval: Pt describes difficulty figuring out how to thread his L arm into his  shirt sleeve Goal status: INITIAL  ASSESSMENT:  CLINICAL IMPRESSION:  Pt. reports that the issues He was having last week after the stroke are better this week. Pt. reports no issues with donning a shirt this week, and the writing on shirts is no longer backwards. Pt. reports having no issues with returning to work this past week, and was able to navigate the computer efficiently for work related tasks. Pt. presented with intact visual discrimination, visual discrimination for both similar and different objects, visual memory, and visual closure skills. Pt. was able to efficiently complete the letter reversal grid, the directional arrow worksheet. Pt. presented with difficulty with flip-flop targets for mirror images from the upper left to the lower left quadrant, as well as from the upper left quadrant to the right lower quadrant as indicated above. Pt. continues to benefit from skilled OT services in order to address above noted motor apraxia, ataxia, and visual processing deficits in order to maximize indep with ADL/IADL/work tasks.    PERFORMANCE DEFICITS: in functional skills including ADLs, IADLs, coordination, dexterity, sensation, Fine motor control, Gross motor control, vision, and UE functional use, cognitive skills including memory, perception, problem solving, and sequencing, and psychosocial skills including coping strategies, environmental adaptation, habits, and routines and behaviors.   IMPAIRMENTS: are limiting patient from ADLs, IADLs, work, and leisure.   CO-MORBIDITIES: has co-morbidities such as 2 previous CVAs, hx ETOH abuse, HTN  that affects occupational performance. Patient will benefit from skilled OT to address above impairments and improve overall function.  MODIFICATION OR ASSISTANCE TO COMPLETE EVALUATION: No modification of tasks or assist necessary to complete an evaluation.  OT OCCUPATIONAL PROFILE AND HISTORY: Detailed assessment: Review of records and additional  review of physical, cognitive, psychosocial history related to current functional performance.  CLINICAL DECISION MAKING: Moderate - several treatment options, min-mod task modification necessary  REHAB POTENTIAL: Good  EVALUATION COMPLEXITY: Moderate    PLAN:  OT FREQUENCY: 2x/week  OT DURATION: 12 weeks  PLANNED INTERVENTIONS: 97168 OT Re-evaluation, 97535 self care/ADL training, 60454 therapeutic exercise, 97530 therapeutic activity, 97112 neuromuscular re-education, [id number] Cognitive training (first 15 min), [id number]  Cognitive training(each additional 15 min), visual/perceptual remediation/compensation, psychosocial skills training, coping strategies training, patient/family education, and DME and/or AE instructions  RECOMMENDED OTHER SERVICES: None at this time (PT evaluation this date)  CONSULTED AND AGREED WITH PLAN OF CARE: Patient  PLAN FOR NEXT SESSION: See above  Olegario Messier, MS, OTR/L   06/25/2023, 10:39 AM

## 2023-06-25 NOTE — Therapy (Signed)
 OUTPATIENT PHYSICAL THERAPY NEURO TREATMENT   Patient Name: Henry Boone MRN: [id number] DOB:1965/10/01, 58 y.o., male Today's Date: 06/25/2023   PCP: Marisue Ivan REFERRING PROVIDER: Marisue Ivan   END OF SESSION:  PT End of Session - 06/25/23 0849     Visit Number 2    Number of Visits 24    Date for PT Re-Evaluation 09/12/23    PT Start Time 0848    PT Stop Time 0930    PT Time Calculation (min) 42 min    Equipment Utilized During Treatment Gait belt    Activity Tolerance Patient tolerated treatment well    Behavior During Therapy WFL for tasks assessed/performed              Past Medical History:  Diagnosis Date   ETOH abuse    History of cerebral artery stenosis    Hyperlipidemia    Hypertension    Stroke [facility])    Past Surgical History:  Procedure Laterality Date   COLONOSCOPY WITH PROPOFOL N/A 01/20/2019   Procedure: COLONOSCOPY WITH PROPOFOL;  Surgeon: Toledo, Boykin Nearing, MD;  Location: [facility] ENDOSCOPY;  Service: Gastroenterology;  Laterality: N/A;   IR ANGIO EXTRACRAN SEL COM CAROTID INNOMINATE UNI BILAT MOD SED  07/23/2017   IR ANGIO INTRA EXTRACRAN SEL COM CAROTID INNOMINATE UNI R MOD SED  11/07/2017   IR ANGIO VERTEBRAL SEL SUBCLAVIAN INNOMINATE UNI R MOD SED  11/07/2017   IR ANGIO VERTEBRAL SEL VERTEBRAL BILAT MOD SED  07/23/2017   IR CT HEAD LTD  06/11/2023   IR CT HEAD LTD  06/11/2023   IR INTRA CRAN STENT  06/11/2023   IR PERCUTANEOUS ART THROMBECTOMY/INFUSION INTRACRANIAL INC DIAG ANGIO  06/11/2023   MICROLARYNGOSCOPY N/A 06/30/2019   Procedure: SUSPENSION MICROLARYNGOSCOPY WITH MICROFLAP;  Surgeon: Bud Face, MD;  Location: [facility] ORS;  Service: ENT;  Laterality: N/A;   RADIOLOGY WITH ANESTHESIA N/A 11/07/2017   Procedure: STENTING;  Surgeon: Julieanne Cotton, MD;  Location: [facility] OR;  Service: Radiology;  Laterality: N/A;   RADIOLOGY WITH ANESTHESIA N/A 06/11/2023   Procedure: IR WITH ANESTHESIA;  Surgeon: Radiologist, Medication, MD;   Location: [facility] OR;  Service: Radiology;  Laterality: N/A;   TEE WITHOUT CARDIOVERSION N/A 11/15/2014   Procedure: TRANSESOPHAGEAL ECHOCARDIOGRAM (TEE);  Surgeon: Antonieta Iba, MD;  Location: [facility] ORS;  Service: Cardiovascular;  Laterality: N/A;   TONSILLECTOMY     Patient Active Problem List   Diagnosis Date Noted   Acute cerebrovascular accident (CVA) due to occlusion of right carotid artery (HCC) 06/11/2023   Internal carotid artery occlusion, right 06/11/2023   Carotid occlusion, left 07/25/2017   Carotid stenosis, right 07/25/2017   History of ischemic multifocal multiple vascular territories stroke 07/25/2017   Essential hypertension 07/25/2017   Hyperlipidemia 07/25/2017   Tobacco use disorder 07/25/2017   Leukocytosis 07/25/2017   Cerebrovascular accident (CVA) due to bilateral occlusion of middle cerebral arteries (HCC) 07/23/2017   Cerebral thrombosis with cerebral infarction (HCC) 11/12/2014   Amaurosis fugax 11/11/2014    ONSET DATE: 06/11/23  REFERRING DIAG: CVA  THERAPY DIAG:  Unsteadiness on feet  Muscle weakness (generalized)  Difficulty in walking, not elsewhere classified  Other lack of coordination  Visual-spatial impairment  Rationale for Evaluation and Treatment: Rehabilitation  SUBJECTIVE:  SUBJECTIVE STATEMENT: Pt reports no new complaints and states he has no issues with the exercises given either. Pt accompanied by: self   PERTINENT HISTORY: Patient presents for evaluation s/p CVA; occlusion of R carotid artery.  Patient was hospitalized for a stroke on Feb 25th, 2025 and discharged Feb 27th. A stent was placed in R carotid artery.  R MCA territory stroke noted. PMH includes HTN, tobacco dependence, stroke (2016), squamous cell carcinoma of vocal cord (2022),  Lcarotid occlusion (2019), R carotid stenosis, prior R parietal and septal ischemic infarcts with resiodual mild hemianopia. 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   PAIN:  Are you having pain? No  PRECAUTIONS: Fall  RED FLAGS: None   WEIGHT BEARING RESTRICTIONS: No; no lifting >5lb due to stent  FALLS: Has patient fallen in last 6 months? Yes. 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   PATIENT GOALS: continue to do the things I enjoy doing.    OBJECTIVE:  Note: Objective measures were completed at Evaluation unless otherwise noted.  DIAGNOSTIC FINDINGS:  06/11/23  IMPRESSION: 1. Emergent large vessel occlusion at the right cervical carotid. There is intracranial reconstitution at the ophthalmic segment. The right ICA feeds the bilateral anterior circulation in the setting of chronic left cavernous carotid occlusion and the right PCA in the setting of fetal type PCA circulation on the right. Ischemic perfusion is seen in the bilateral MCA territory. 15 cc area of infarct reported in the right cerebral hemisphere, although overestimated given tagging of a chronic parietal infarct. 2. Persistent trigeminal artery on the left. 3. Chronic high-grade narrowing at the right vertebral origin due to calcified plaque. 4. Advanced narrowing of the left subclavian after the vertebral origin with irregular plaque. 5. Note that the arch and great vessel ostia are not covered. 6. 50% narrowing of the distal cervical ICA on the left, progressed from 2019. 7. Emphysema with suspicious 14 mm nodule at the left upper lobe, most concerning for tumor, recommend chest CT after convalescence.  COGNITION: Overall cognitive status:  short  term memory   SENSATION: Light touch: Impaired ; decreased LLE   COORDINATION: Finger nose test: relatively equal coordination of UE Heel shin test: decreased coordination of LLE  POSTURE: rounded shoulders  LOWER EXTREMITY ROM:    WFL  LOWER EXTREMITY MMT:    MMT Right Eval Left Eval  Hip flexion 7.8 4.9  Hip extension    Hip abduction 8.5 4.1  Hip adduction 6.9 3.1  Hip internal rotation    Hip external rotation    Knee flexion 8.9 8.2  Knee extension 9.5 8.2  Ankle dorsiflexion 10.4 8.3  Ankle plantarflexion 12.8 10.8  Ankle inversion    Ankle eversion    (Blank rows = not tested)    TRANSFERS: Assistive device utilized: None  Sit to stand: SBA Stand to sit: SBA Chair to chair: SBA  STAIRS: Level of Assistance: CGA Stair Negotiation Technique: Alternating Pattern  with Single Rail on Right Number of Stairs: 5  Height of Stairs: 6"  Comments: limited spatial awareness of LLE  GAIT: Gait pattern: decreased arm swing- Left, decreased stance time- Left, decreased ankle dorsiflexion- Left, and circumduction- Left Distance walked: 60 Assistive device utilized: None Level of assistance: SBA  and CGA Comments: snake type patterning of L foot with slight abduction  FUNCTIONAL TESTS:  5 times sit to stand: 16.52 10 meter walk test: 9.07 seconds  Berg Balance Scale: performed FGA instead FGA: 18/30    PATIENT SURVEYS:  LEFS 66                                                                                                                              TREATMENT DATE: 06/25/23   TherEx: To improve strength, endurance, mobility, and function of specific targeted muscle groups or improve joint range of motion or improve muscle flexibility  Seated hamstring curls with BTB, 2x10 each LE Seated resisted marching with BTB, 2x10 each LE Seated clamshells with BTB, 2x10 each LE Seated leg press, 85#, 3x10   TherAct: To improve functional movements patterns  for everyday tasks  Cable column walkouts, forward/latera/backward, 12.5#, x5 each direction   Neuromuscular Re-Ed: To facilitate reeducation of movement, balance, posture, coordination, and/or proprioception/kinesthetic sense.  Ambulation in long hallway with letter/post it note recalls for increased stability during ambulation, length of hallway x2 Ambulation in long hallway with counting down by 4's from 90, x length of hallway      PATIENT EDUCATION: Education details: goals, POC  Person educated: Patient Education method: Explanation, Demonstration, Tactile cues, and Verbal cues Education comprehension: verbalized understanding, returned demonstration, verbal cues required, tactile cues required, and needs further education  HOME EXERCISE PROGRAM: HEP: Access Code: [email or web address] URL: [email or web address] Date: 06/20/2023 Prepared by: Precious Bard  Exercises - Seated Ankle Alphabet  - 1 x daily - 7 x weekly - 2 sets - 1 reps - 5 hold - Seated Marching with Opposite Shoulder Flexion  - 1 x daily - 7 x weekly - 2 sets - 10 reps - 5 hold  GOALS: Goals reviewed with patient? Yes  SHORT TERM GOALS: Target date: 07/04/2023   Patient will be independent in home exercise program to improve strength/mobility for better functional independence with ADLs. Baseline: Goal status: INITIAL   LONG TERM GOALS: Target date: 09/12/2023  Patient will increase Functional Gait Assessment score to >26/30 as to reduce fall risk and improve dynamic gait safety with community ambulation. Baseline: 3/6: 18/56 Goal status: INITIAL  2.  Patient will increase lower extremity functional scale to >75/80 to demonstrate improved functional mobility and increased tolerance with ADLs.  Baseline:  3/6: 66/80  Goal status: INITIAL  3.  Patient will increase BLE gross strength to 4+/5  and/or within 2lb of force as primary limb as to improve functional strength for independent gait, increased  standing tolerance and increased ADL ability. Baseline: 3/6: see above Goal status: INITIAL  4.   Patient (< 82 years old) will complete five times sit to stand test in < 10 seconds indicating an increased LE strength and improved balance. Baseline: 16.52 Goal status: INITIAL    ASSESSMENT:  CLINICAL IMPRESSION:  Pt has made significant improvements in strength, vision,  and ability to ambulate without any balance concerns.  Pt able to tolerate strengthening exercises without much difficulty as well.  Pt noted to have some instability with resisted ambulation at times, but was abl to self-correct and continue performance.  Pt likely not needing much more therapy with progression made over the past couple of days.  Will attempt to perform more balance related activities at next session.  Pt will continue to benefit from skilled therapy to address remaining deficits in order to improve overall QoL and return to PLOF.      OBJECTIVE IMPAIRMENTS: Abnormal gait, cardiopulmonary status limiting activity, decreased activity tolerance, decreased balance, decreased coordination, decreased endurance, decreased mobility, difficulty walking, decreased strength, impaired perceived functional ability, impaired sensation, impaired vision/preception, improper body mechanics, and postural dysfunction.   ACTIVITY LIMITATIONS: carrying, lifting, bending, standing, squatting, stairs, transfers, bed mobility, bathing, dressing, self feeding, reach over head, hygiene/grooming, and locomotion level  PARTICIPATION LIMITATIONS: meal prep, cleaning, driving, shopping, community activity, occupation, and yard work  PERSONAL FACTORS: Age, Fitness, Past/current experiences, Time since onset of injury/illness/exacerbation, Transportation, and 3+ comorbidities: [place], tobacco dependence, stroke (2016), squamous cell carcinoma of vocal cord (2022), Lcarotid occlusion (2019), R carotid stenosis, prior R parietal and septal  ischemic infarcts with resiodual mild hemianopia.   are also affecting patient's functional outcome.   REHAB POTENTIAL: Good  CLINICAL DECISION MAKING: Evolving/moderate complexity  EVALUATION COMPLEXITY: Moderate  PLAN:  PT FREQUENCY: 2x/week  PT DURATION: 12 weeks  PLANNED INTERVENTIONS: 97164- PT Re-evaluation, 97110-Therapeutic exercises, 97530- Therapeutic activity, 97112- Neuromuscular re-education, 97535- Self Care, 29562- Manual therapy, [phone]- Gait training, [phone]- Orthotic Fit/training, [phone]- Canalith repositioning, [id number]- Electrical stimulation (unattended), [phone]- Electrical stimulation (manual), [id number]- Ultrasound, 02725- Traction (mechanical), [id number]- Ionotophoresis 4mg /ml Dexamethasone, Patient/Family education, Balance training, Stair training, Taping, Dry Needling, Joint mobilization, Spinal mobilization, Scar mobilization, Compression bandaging, Vestibular training, Visual/preceptual remediation/compensation, Cognitive remediation, DME instructions, Cryotherapy, Moist heat, and Biofeedback  PLAN FOR NEXT SESSION: airex pad, dual task, coordination, strength   Nolon Bussing, PT, DPT Physical Therapist - [facility]  [facility]  06/25/23, 10:38 AM

## 2023-06-27 ENCOUNTER — Encounter

## 2023-06-27 ENCOUNTER — Ambulatory Visit (HOSPITAL_COMMUNITY)
Admission: RE | Admit: 2023-06-27 | Discharge: 2023-06-27 | Disposition: A | Discharge status: Home / Self Care | Source: Ambulatory Visit | Attending: Radiology | Admitting: Radiology

## 2023-06-27 ENCOUNTER — Ambulatory Visit

## 2023-06-27 DIAGNOSIS — I6521 Occlusion and stenosis of right carotid artery: Secondary | ICD-10-CM

## 2023-06-28 HISTORY — PX: IR RADIOLOGIST EVAL & MGMT: IMG5224

## 2023-07-01 ENCOUNTER — Ambulatory Visit

## 2023-07-01 DIAGNOSIS — M6281 Muscle weakness (generalized): Secondary | ICD-10-CM

## 2023-07-01 DIAGNOSIS — R41842 Visuospatial deficit: Secondary | ICD-10-CM

## 2023-07-01 DIAGNOSIS — R278 Other lack of coordination: Secondary | ICD-10-CM

## 2023-07-01 NOTE — Therapy (Signed)
 OUTPATIENT OCCUPATIONAL THERAPY NEURO TREATMENT NOTE  Patient Name: Henry Boone MRN: [id number] DOB:1965-05-12, 58 y.o., male Today's Date: 07/01/2023  PCP: Dr. Burnadette Pop REFERRING PROVIDER: Debbra Riding, PA-C  END OF SESSION:  OT End of Session - 07/01/23 1322     Visit Number 3    Number of Visits 24    Date for OT Re-Evaluation 09/12/23    Progress Note Due on Visit 10    OT Start Time 1152    OT Stop Time 1230    OT Time Calculation (min) 38 min    Activity Tolerance Patient tolerated treatment well    Behavior During Therapy WFL for tasks assessed/performed            Past Medical History:  Diagnosis Date   ETOH abuse    History of cerebral artery stenosis    Hyperlipidemia    Hypertension    Stroke [facility])    Past Surgical History:  Procedure Laterality Date   COLONOSCOPY WITH PROPOFOL N/A 01/20/2019   Procedure: COLONOSCOPY WITH PROPOFOL;  Surgeon: Toledo, Boykin Nearing, MD;  Location: [facility] ENDOSCOPY;  Service: Gastroenterology;  Laterality: N/A;   IR ANGIO EXTRACRAN SEL COM CAROTID INNOMINATE UNI BILAT MOD SED  07/23/2017   IR ANGIO INTRA EXTRACRAN SEL COM CAROTID INNOMINATE UNI R MOD SED  11/07/2017   IR ANGIO VERTEBRAL SEL SUBCLAVIAN INNOMINATE UNI R MOD SED  11/07/2017   IR ANGIO VERTEBRAL SEL VERTEBRAL BILAT MOD SED  07/23/2017   IR CT HEAD LTD  06/11/2023   IR CT HEAD LTD  06/11/2023   IR INTRA CRAN STENT  06/11/2023   IR PERCUTANEOUS ART THROMBECTOMY/INFUSION INTRACRANIAL INC DIAG ANGIO  06/11/2023   IR RADIOLOGIST EVAL & MGMT  06/28/2023   MICROLARYNGOSCOPY N/A 06/30/2019   Procedure: SUSPENSION MICROLARYNGOSCOPY WITH MICROFLAP;  Surgeon: Bud Face, MD;  Location: [facility] ORS;  Service: ENT;  Laterality: N/A;   RADIOLOGY WITH ANESTHESIA N/A 11/07/2017   Procedure: STENTING;  Surgeon: Julieanne Cotton, MD;  Location: [facility] OR;  Service: Radiology;  Laterality: N/A;   RADIOLOGY WITH ANESTHESIA N/A 06/11/2023   Procedure: IR WITH ANESTHESIA;  Surgeon: Radiologist,  Medication, MD;  Location: [facility] OR;  Service: Radiology;  Laterality: N/A;   TEE WITHOUT CARDIOVERSION N/A 11/15/2014   Procedure: TRANSESOPHAGEAL ECHOCARDIOGRAM (TEE);  Surgeon: Antonieta Iba, MD;  Location: [facility] ORS;  Service: Cardiovascular;  Laterality: N/A;   TONSILLECTOMY     Patient Active Problem List   Diagnosis Date Noted   Acute cerebrovascular accident (CVA) due to occlusion of right carotid artery (HCC) 06/11/2023   Internal carotid artery occlusion, right 06/11/2023   Carotid occlusion, left 07/25/2017   Carotid stenosis, right 07/25/2017   History of ischemic multifocal multiple vascular territories stroke 07/25/2017   Essential hypertension 07/25/2017   Hyperlipidemia 07/25/2017   Tobacco use disorder 07/25/2017   Leukocytosis 07/25/2017   Cerebrovascular accident (CVA) due to bilateral occlusion of middle cerebral arteries (HCC) 07/23/2017   Cerebral thrombosis with cerebral infarction (HCC) 11/12/2014   Amaurosis fugax 11/11/2014   ONSET DATE: 06/11/23  REFERRING DIAG: I63.9 (ICD-10-CM) - Cerebral infarction, unspecified   THERAPY DIAG:  Muscle weakness (generalized)  Other lack of coordination  Visual-spatial impairment  Rationale for Evaluation and Treatment: Rehabilitation  SUBJECTIVE:  SUBJECTIVE STATEMENT: Pt reports that visual processing seems to continue to improve.   Pt accompanied by: self  PERTINENT HISTORY: Per medical record from MD visit on 06/18/23: History of Present Illness Henry Boone is a 58 year old male  with a history of stroke who presents for follow-up after recent hospitalization for another stroke.  He was hospitalized for a stroke on February 25th and discharged on February 27th. During the hospital stay, a stent was placed in the right carotid artery due to stenosis. Right MCA territory stroke noted due to stenosis of the carotid artery he continues to experience numbness on the left side of his body, which has improved somewhat. His  upper left leg feels normal, but he describes a sensation of walking on a pillow. His arm feels almost normal, but he experiences persistent numbness from the side of his neck down to his shoulder, which he finds frustrating.  He is currently on new medications, including amlodipine, which he has been taking for a week. His blood pressure has never been lower, averaging 117/79, and he experiences no side effects. Previously, with encephalol, he experienced lightheadedness upon standing after sitting for long periods.  He has a history of carotid artery stenosis and is scheduled for follow-up appointments with radiology and neurology. He has not yet scheduled rehabilitation therapy.  He reports some short-term memory issues and increased irritability, which he attributes to frustration. He describes difficulty with tasks such as making coffee and feeding his dogs due to coordination issues. He has not been using his computer, which was a regular pastime, and has been taking naps in the afternoon, which is unusual for him.   Per chart, pt also reports difficulty with tasks such as using a spoon.  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  PRECAUTIONS: None  WEIGHT BEARING RESTRICTIONS: No  PAIN:  Are you having pain? No  FALLS: Has patient fallen in last 6 months? Yes. Number of falls at least 2 (pt attributes it to numbness from CVA)  LIVING ENVIRONMENT: Lives with: lives with their spouse Lives in: 1 level home Stairs: Yes: External: 2 steps; none Has following equipment at home: None  PLOF: Independent, works full time as a Scientist, physiological.  Enjoys cooking.    PATIENT GOALS: "I want to make sure I'm back to normal or as close as I can be."   OBJECTIVE:  Note: Objective measures were completed at Evaluation unless otherwise noted.  HAND DOMINANCE: Ambidextrous, but favors the L.     ADLs: Overall ADLs: Pt describes increased time with visual/spatial components of ADLs and some difficulties with sequencing Transfers/ambulation related to ADLs: indep Eating: Able to cut food; though reports struggling to understand which way to turn his spoon the other day, and tipping his coffee cup away from him and spilling rather than tipping it toward his mouth to take a sip Grooming: not yet doing his own shaving d/t fear of error or cutting  UB Dressing: difficulty trying to "figure out how to get the L arm in the sleeve." LB Dressing: indep, reporting no difficulties Toileting: indep Bathing: indep; reports no difficulties with sequencing through steps of bathing Tub Shower transfers: increased caution to step over tub d/t LLE paresthesias Equipment: none  IADLs: words on shirt appeared backwards, spoon appeared upside down  Shopping: Not yet attempted Light housekeeping: indep Meal Prep: spouse currently managing; pt is typically the one who cooks at baseline Community mobility: spouse currently driving Medication management: indep Landscape architect: indep  Handwriting: Increased time  MOBILITY STATUS: indep without AD; see PT eval for additional details  POSTURE COMMENTS:  No  Significant postural limitations  ACTIVITY TOLERANCE: Activity tolerance: WFL for tasks assessed  FUNCTIONAL OUTCOME MEASURES: Plan to complete MVPT in upcoming session   UPPER EXTREMITY ROM:  WNL  UPPER EXTREMITY MMT:   5/5  HAND FUNCTION: Grip strength: Right: 79 lbs; Left: 76 lbs, Lateral pinch: Right: 26 lbs, Left: 24 lbs, and 3 point pinch: Right: 18 lbs, Left: 14 lbs  COORDINATION: 9 Hole Peg test: Right: 33 sec; Left: 39 sec  SENSATION: Pt reports numbness in L leg, L hip, L side of torso (anterior and posterior); hx of numbness in ulnar digits of R hand from previous CVA in 2019.  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  Pt also reports struggling to figure out how to put his L arm into his shirt sleeve, and when reading the words on a shirt that his wife held up for him, pt asked spouse why the letters had been reversed.  Patient has difficulty with following activities due to following visual impairments: dressing, meal prep, self feeding  PERCEPTION: Impaired: Body scheme: TBD and Spatial orientation: TBD; Further assessment needed  PRAXIS: Impaired: Motor planning; mild LUE apraxia and ataxia                                                                                                                     TREATMENT DATE: 07/01/23 Therapeutic Activity: -Completed visual motor integration activities this date to continue to identify deficits vs improvements as compared to last 2 sessions.  -Directionality visual motor worksheet: 48/48 correct; 1 pause/near error with quick independent correction -Laterality coding chart, engaging R/L and BUE/LE into directionality using visual coding chart.  2 trials completed, with improved speed and coordination on 2nd trial after repetition from 1st trial.  -Advanced Parquetry puzzles (3); 1 min vc on 1 of 3, extra time for 2 of 3  PATIENT EDUCATION: Education details: Recommendation to engage in puzzles/puzzle games on phone to  target visual processing skills Person educated: Patient Education method: Explanation Education comprehension: verbalized understanding  HOME EXERCISE PROGRAM: Theraputty; puzzles  GOALS: Goals reviewed with patient? Yes  SHORT TERM GOALS: Target date: 08/01/23  Pt will be indep to perform HEP for improving LUE GMC/FMC skills. Baseline: Eval: HEP not yet initiated Goal status: INITIAL  LONG TERM GOALS: Target date: 09/12/23  Pt will be indep to correctly  sequence a 3-4 step task to enable indep with making a cup of coffee. Baseline: Eval: Pt reports he couldn't figure out how to sequence making his coffee, and this task required 5 attempts before successful completion Goal status: INITIAL  2.   Pt will increase L hand [facility] skills in order to increase typing speed for work efficiency. Baseline: Eval: Will assess typing speed in upcoming session Goal status: INITIAL  3.   Pt will complete MVPT to identify more specific areas of visual processing deficits. Baseline: Eval: Plan to complete next session Goal status: INITIAL  4.   Pt will utilize visual compensatory strategies as needed in order to don a shirt with modified indep and good efficiency. Baseline: Eval: Pt describes difficulty figuring out how to thread his L arm into his shirt sleeve Goal status: INITIAL  ASSESSMENT:  CLINICAL IMPRESSION: Pt continues to report no issues with work performance, and that his visual processing seems to continue to improve.  Pt demonstrated good performance with visual motor integration activities noted above, estimating speed and any near errors to be WFL-WNL.  D/t difficulty formulating mirror images with figures on paper last session, pt may benefit from reassessment of this skill next session.  Pt reports that GMC still seems a little impaired, noting some spilling when holding and filling his cat's water dish over the weekend.  Pt receptive to [facility] training next session with HEP additions  as appropriate.  Pt reports that he is now making his coffee without errors in his sequencing.  Will plan to further determine accuracy and consistency next session with trial of 4 step sequencing cards.  Pt. continues to benefit from skilled OT services in order to address above noted motor apraxia, ataxia, and visual processing deficits in order to maximize indep and efficiency with ADL/IADL/work tasks.    PERFORMANCE DEFICITS: in functional skills including ADLs, IADLs, coordination, dexterity, sensation, Fine motor control, Gross motor control, vision, and UE functional use, cognitive skills including memory, perception, problem solving, and sequencing, and psychosocial skills including coping strategies, environmental adaptation, habits, and routines and behaviors.   IMPAIRMENTS: are limiting patient from ADLs, IADLs, work, and leisure.   CO-MORBIDITIES: has co-morbidities such as 2 previous CVAs, hx ETOH abuse, HTN  that affects occupational performance. Patient will benefit from skilled OT to address above impairments and improve overall function.  MODIFICATION OR ASSISTANCE TO COMPLETE EVALUATION: No modification of tasks or assist necessary to complete an evaluation.  OT OCCUPATIONAL PROFILE AND HISTORY: Detailed assessment: Review of records and additional review of physical, cognitive, psychosocial history related to current functional performance.  CLINICAL DECISION MAKING: Moderate - several treatment options, min-mod task modification necessary  REHAB POTENTIAL: Good  EVALUATION COMPLEXITY: Moderate    PLAN:  OT FREQUENCY: 2x/week  OT DURATION: 12 weeks  PLANNED INTERVENTIONS: 97168 OT Re-evaluation, 97535 self care/ADL training, 40981 therapeutic exercise, 97530 therapeutic activity, 97112 neuromuscular re-education, [id number] Cognitive training (first 15 min), 19147 Cognitive training(each additional 15 min), visual/perceptual remediation/compensation, psychosocial skills  training, coping strategies training, patient/family education, and DME and/or AE instructions  RECOMMENDED OTHER SERVICES: None at this time (PT evaluation this date)  CONSULTED AND AGREED WITH PLAN OF CARE: Patient  PLAN FOR NEXT SESSION: See above  Danelle Earthly, MS, OTR/L   07/01/2023, 1:24 PM

## 2023-07-03 ENCOUNTER — Ambulatory Visit: Admitting: Occupational Therapy

## 2023-07-03 ENCOUNTER — Ambulatory Visit: Admitting: Physical Therapy

## 2023-07-03 DIAGNOSIS — M6281 Muscle weakness (generalized): Secondary | ICD-10-CM

## 2023-07-03 DIAGNOSIS — R278 Other lack of coordination: Secondary | ICD-10-CM

## 2023-07-03 NOTE — Therapy (Signed)
 OUTPATIENT OCCUPATIONAL THERAPY NEURO TREATMENT NOTE  Patient Name: Henry Boone MRN: [id number] DOB:1965-06-05, 58 y.o., male Today's Date: 07/03/2023  PCP: Dr. Burnadette Boone REFERRING PROVIDER: Debbra Riding, PA-C  END OF SESSION:  OT End of Session - 07/03/23 1039     Visit Number 4    Number of Visits 24    Date for OT Re-Evaluation 09/12/23    OT Start Time 0850    OT Stop Time 0930    OT Time Calculation (min) 40 min    Activity Tolerance Patient tolerated treatment well    Behavior During Therapy [facility] for tasks assessed/performed            Past Medical History:  Diagnosis Date   ETOH abuse    History of cerebral artery stenosis    Hyperlipidemia    Hypertension    Stroke [facility])    Past Surgical History:  Procedure Laterality Date   COLONOSCOPY WITH PROPOFOL N/A 01/20/2019   Procedure: COLONOSCOPY WITH PROPOFOL;  Surgeon: Toledo, Boykin Nearing, MD;  Location: [facility] ENDOSCOPY;  Service: Gastroenterology;  Laterality: N/A;   IR ANGIO EXTRACRAN SEL COM CAROTID INNOMINATE UNI BILAT MOD SED  07/23/2017   IR ANGIO INTRA EXTRACRAN SEL COM CAROTID INNOMINATE UNI R MOD SED  11/07/2017   IR ANGIO VERTEBRAL SEL SUBCLAVIAN INNOMINATE UNI R MOD SED  11/07/2017   IR ANGIO VERTEBRAL SEL VERTEBRAL BILAT MOD SED  07/23/2017   IR CT HEAD LTD  06/11/2023   IR CT HEAD LTD  06/11/2023   IR INTRA CRAN STENT  06/11/2023   IR PERCUTANEOUS ART THROMBECTOMY/INFUSION INTRACRANIAL INC DIAG ANGIO  06/11/2023   IR RADIOLOGIST EVAL & MGMT  06/28/2023   MICROLARYNGOSCOPY N/A 06/30/2019   Procedure: SUSPENSION MICROLARYNGOSCOPY WITH MICROFLAP;  Surgeon: Bud Face, MD;  Location: [facility] ORS;  Service: ENT;  Laterality: N/A;   RADIOLOGY WITH ANESTHESIA N/A 11/07/2017   Procedure: STENTING;  Surgeon: Julieanne Cotton, MD;  Location: [facility] OR;  Service: Radiology;  Laterality: N/A;   RADIOLOGY WITH ANESTHESIA N/A 06/11/2023   Procedure: IR WITH ANESTHESIA;  Surgeon: Radiologist, Medication, MD;  Location: [facility] OR;   Service: Radiology;  Laterality: N/A;   TEE WITHOUT CARDIOVERSION N/A 11/15/2014   Procedure: TRANSESOPHAGEAL ECHOCARDIOGRAM (TEE);  Surgeon: Antonieta Iba, MD;  Location: [facility] ORS;  Service: Cardiovascular;  Laterality: N/A;   TONSILLECTOMY     Patient Active Problem List   Diagnosis Date Noted   Acute cerebrovascular accident (CVA) due to occlusion of right carotid artery (HCC) 06/11/2023   Internal carotid artery occlusion, right 06/11/2023   Carotid occlusion, left 07/25/2017   Carotid stenosis, right 07/25/2017   History of ischemic multifocal multiple vascular territories stroke 07/25/2017   Essential hypertension 07/25/2017   Hyperlipidemia 07/25/2017   Tobacco use disorder 07/25/2017   Leukocytosis 07/25/2017   Cerebrovascular accident (CVA) due to bilateral occlusion of middle cerebral arteries (HCC) 07/23/2017   Cerebral thrombosis with cerebral infarction (HCC) 11/12/2014   Amaurosis fugax 11/11/2014   ONSET DATE: 06/11/23  REFERRING DIAG: I63.9 (ICD-10-CM) - Cerebral infarction, unspecified   THERAPY DIAG:  Muscle weakness (generalized)  Other lack of coordination  Rationale for Evaluation and Treatment: Rehabilitation  SUBJECTIVE:   SUBJECTIVE STATEMENT:  Pt. reports that things went fine with returning to work. Pt. Reports no difficulties with it. Pt accompanied by: self  PERTINENT HISTORY: Per medical record from MD visit on 06/18/23: History of Present Illness Henry Boone is a 58 year old male with a history of stroke who  presents for follow-up after recent hospitalization for another stroke.  He was hospitalized for a stroke on February 25th and discharged on February 27th. During the hospital stay, a stent was placed in the right carotid artery due to stenosis. Right MCA territory stroke noted due to stenosis of the carotid artery he continues to experience numbness on the left side of his body, which has improved somewhat. His upper left leg feels normal, but  he describes a sensation of walking on a pillow. His arm feels almost normal, but he experiences persistent numbness from the side of his neck down to his shoulder, which he finds frustrating.  He is currently on new medications, including amlodipine, which he has been taking for a week. His blood pressure has never been lower, averaging 117/79, and he experiences no side effects. Previously, with encephalol, he experienced lightheadedness upon standing after sitting for long periods.  He has a history of carotid artery stenosis and is scheduled for follow-up appointments with radiology and neurology. He has not yet scheduled rehabilitation therapy.  He reports some short-term memory issues and increased irritability, which he attributes to frustration. He describes difficulty with tasks such as making coffee and feeding his dogs due to coordination issues. He has not been using his computer, which was a regular pastime, and has been taking naps in the afternoon, which is unusual for him.   Per chart, pt also reports difficulty with tasks such as using a spoon.  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   PRECAUTIONS: None  WEIGHT BEARING RESTRICTIONS: No  PAIN:  Are you having pain? No  FALLS: Has patient fallen in last 6 months? Yes. Number of falls at least 2 (pt attributes it to numbness from CVA)  LIVING ENVIRONMENT: Lives with: lives with their spouse Lives in: 1 level home Stairs: Yes: External: 2 steps; none Has following equipment at home: None  PLOF: Independent, works full time as a Scientist, physiological.  Enjoys cooking.    PATIENT GOALS: "I want to make sure I'm back to normal or as close as I can be."   OBJECTIVE:  Note: Objective measures were completed at Evaluation unless otherwise noted.  HAND DOMINANCE: Ambidextrous, but favors the L.    ADLs: Overall ADLs: Pt describes  increased time with visual/spatial components of ADLs and some difficulties with sequencing Transfers/ambulation related to ADLs: indep Eating: Able to cut food; though reports struggling to understand which way to turn his spoon the other day, and tipping his coffee cup away from him and spilling rather than tipping it toward his mouth to take a sip Grooming: not yet doing his own shaving d/t fear of error or cutting  UB Dressing: difficulty trying to "figure out how to get the L arm in the sleeve." LB Dressing: indep, reporting no difficulties Toileting: indep Bathing: indep; reports no difficulties with sequencing through steps of bathing Tub Shower transfers: increased caution to step over tub d/t LLE paresthesias Equipment: none  IADLs: words on shirt appeared backwards, spoon appeared upside down  Shopping: Not yet attempted Light housekeeping: indep Meal Prep: spouse currently managing; pt is typically the one who cooks at baseline Community mobility: spouse currently driving Medication management: indep Landscape architect: indep  Handwriting: Increased time  MOBILITY STATUS: indep without AD; see PT eval for additional details  POSTURE COMMENTS:  No Significant postural limitations  ACTIVITY  TOLERANCE: Activity tolerance: WFL for tasks assessed  FUNCTIONAL OUTCOME MEASURES: Plan to complete MVPT in upcoming session   UPPER EXTREMITY ROM:  WNL  UPPER EXTREMITY MMT:   5/5  HAND FUNCTION: Grip strength: Right: 79 lbs; Left: 76 lbs, Lateral pinch: Right: 26 lbs, Left: 24 lbs, and 3 point pinch: Right: 18 lbs, Left: 14 lbs  COORDINATION: 9 Hole Peg test: Right: 33 sec; Left: 39 sec  SENSATION: Pt reports numbness in L leg, L hip, L side of torso (anterior and posterior); hx of numbness in ulnar digits of R hand from previous CVA in 2019.  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  Pt also reports struggling to figure out how to put his L arm into his shirt sleeve, and when reading the words on a shirt that his wife held up for him, pt asked spouse why the letters had been reversed.  Patient has difficulty with following activities due to following visual impairments: dressing, meal prep, self feeding  PERCEPTION: Impaired: Body scheme: TBD and Spatial orientation: TBD; Further assessment needed  PRAXIS: Impaired: Motor planning; mild LUE apraxia and ataxia                                                                                                                     TREATMENT DATE: 07/03/23  Therapeutic Activities:   Completed Flip-Flop Targets worksheet for mirror images in 4 quadrants. Accurately completed 10/10 mirror images from the left to the right upper quadrant  with 100% accuracy. Accurately completed 5/10 mirror images from the upper left to the bottom left. Accurately completed 3/10 mirror images from the upper left to the lower right quadrant.  There. Ex:  -Performed BUE strengthening using 4# hand weight for elbow flexion, and extension, forearm supination/pronation, wrist flexion/extension, and radial deviation with the shoulder flexed to 90 degrees, and elbows fully extended.  -bilateral progressive gross grip strengthening with 23.4# of grip  strength resistive force x's 2 trials. -Incorporated reaching in multiple planes to further challenge the task.         PATIENT EDUCATION: Education details: OT role, goals, poc Person educated: Patient Education method: Explanation Education comprehension: verbalized understanding  HOME EXERCISE PROGRAM: To be initiated in upcoming sessions  GOALS: Goals reviewed with patient? Yes  SHORT TERM GOALS: Target date: 08/01/23  Pt will be  indep to perform HEP for improving LUE GMC/FMC skills. Baseline: Eval: HEP not yet initiated Goal status: INITIAL  LONG TERM GOALS: Target date: 09/12/23  Pt will be indep to correctly sequence a 3-4 step task to enable indep with making a cup of coffee. Baseline: Eval: Pt reports he couldn't figure out how to sequence making his coffee, and this task required 5 attempts before successful completion Goal status: INITIAL  2.   Pt will increase L hand [facility] skills in order to increase typing speed for work efficiency. Baseline: Eval: Will assess typing speed in upcoming session Goal status: INITIAL  3.   Pt will complete MVPT to identify more specific areas of visual processing deficits. Baseline: Eval: Plan to complete next session Goal status: INITIAL  4.   Pt will utilize visual compensatory strategies as needed in order to don a shirt with modified indep and good efficiency. Baseline: Eval: Pt describes difficulty figuring out how to thread his L arm into his shirt sleeve Goal status: INITIAL  ASSESSMENT:  CLINICAL IMPRESSION:  Pt. tolerated the UE exercises well today. Pt. is improving with flip-flop targets for mirror images from the upper left to the lower left quadrant, as well as from the upper left quadrant to the right lower quadrant as indicated above. Pt. continues to benefit from skilled OT services in order to address above noted motor apraxia, ataxia, and visual processing deficits in order to maximize indep with ADL/IADL/work  tasks.    PERFORMANCE DEFICITS: in functional skills including ADLs, IADLs, coordination, dexterity, sensation, Fine motor control, Gross motor control, vision, and UE functional use, cognitive skills including memory, perception, problem solving, and sequencing, and psychosocial skills including coping strategies, environmental adaptation, habits, and routines and behaviors.   IMPAIRMENTS: are limiting patient from ADLs, IADLs, work, and leisure.   CO-MORBIDITIES: has co-morbidities such as 2 previous CVAs, hx ETOH abuse, HTN  that affects occupational performance. Patient will benefit from skilled OT to address above impairments and improve overall function.  MODIFICATION OR ASSISTANCE TO COMPLETE EVALUATION: No modification of tasks or assist necessary to complete an evaluation.  OT OCCUPATIONAL PROFILE AND HISTORY: Detailed assessment: Review of records and additional review of physical, cognitive, psychosocial history related to current functional performance.  CLINICAL DECISION MAKING: Moderate - several treatment options, min-mod task modification necessary  REHAB POTENTIAL: Good  EVALUATION COMPLEXITY: Moderate    PLAN:  OT FREQUENCY: 2x/week  OT DURATION: 12 weeks  PLANNED INTERVENTIONS: 97168 OT Re-evaluation, 97535 self care/ADL training, 62130 therapeutic exercise, 97530 therapeutic activity, 97112 neuromuscular re-education, [id number] Cognitive training (first 15 min), 86578 Cognitive training(each additional 15 min), visual/perceptual remediation/compensation, psychosocial skills training, coping strategies training, patient/family education, and DME and/or AE instructions  RECOMMENDED OTHER SERVICES: None at this time (PT evaluation this date)  CONSULTED AND AGREED WITH PLAN OF CARE: Patient  PLAN FOR NEXT SESSION: See above  Olegario Messier, MS, OTR/L   07/03/2023, 10:42 AM

## 2023-07-03 NOTE — Therapy (Unsigned)
 OUTPATIENT PHYSICAL THERAPY NEURO TREATMENT   Patient Name: Henry Boone MRN: [id number] DOB:[birth year], 58 y.o., male Today's Date: 07/03/2023   PCP: Marisue Ivan REFERRING PROVIDER: Marisue Ivan   END OF SESSION:     Past Medical History:  Diagnosis Date   ETOH abuse    History of cerebral artery stenosis    Hyperlipidemia    Hypertension    Stroke [facility])    Past Surgical History:  Procedure Laterality Date   COLONOSCOPY WITH PROPOFOL N/A 01/20/2019   Procedure: COLONOSCOPY WITH PROPOFOL;  Surgeon: Toledo, Boykin Nearing, MD;  Location: [facility] ENDOSCOPY;  Service: Gastroenterology;  Laterality: N/A;   IR ANGIO EXTRACRAN SEL COM CAROTID INNOMINATE UNI BILAT MOD SED  07/23/2017   IR ANGIO INTRA EXTRACRAN SEL COM CAROTID INNOMINATE UNI R MOD SED  11/07/2017   IR ANGIO VERTEBRAL SEL SUBCLAVIAN INNOMINATE UNI R MOD SED  11/07/2017   IR ANGIO VERTEBRAL SEL VERTEBRAL BILAT MOD SED  07/23/2017   IR CT HEAD LTD  06/11/2023   IR CT HEAD LTD  06/11/2023   IR INTRA CRAN STENT  06/11/2023   IR PERCUTANEOUS ART THROMBECTOMY/INFUSION INTRACRANIAL INC DIAG ANGIO  06/11/2023   IR RADIOLOGIST EVAL & MGMT  06/28/2023   MICROLARYNGOSCOPY N/A 06/30/2019   Procedure: SUSPENSION MICROLARYNGOSCOPY WITH MICROFLAP;  Surgeon: Bud Face, MD;  Location: [facility] ORS;  Service: ENT;  Laterality: N/A;   RADIOLOGY WITH ANESTHESIA N/A 11/07/2017   Procedure: STENTING;  Surgeon: Julieanne Cotton, MD;  Location: [facility] OR;  Service: Radiology;  Laterality: N/A;   RADIOLOGY WITH ANESTHESIA N/A 06/11/2023   Procedure: IR WITH ANESTHESIA;  Surgeon: Radiologist, Medication, MD;  Location: [facility] OR;  Service: Radiology;  Laterality: N/A;   TEE WITHOUT CARDIOVERSION N/A 11/15/2014   Procedure: TRANSESOPHAGEAL ECHOCARDIOGRAM (TEE);  Surgeon: Antonieta Iba, MD;  Location: [facility] ORS;  Service: Cardiovascular;  Laterality: N/A;   TONSILLECTOMY     Patient Active Problem List   Diagnosis Date Noted   Acute cerebrovascular  accident (CVA) due to occlusion of right carotid artery (HCC) 06/11/2023   Internal carotid artery occlusion, right 06/11/2023   Carotid occlusion, left 07/25/2017   Carotid stenosis, right 07/25/2017   History of ischemic multifocal multiple vascular territories stroke 07/25/2017   Essential hypertension 07/25/2017   Hyperlipidemia 07/25/2017   Tobacco use disorder 07/25/2017   Leukocytosis 07/25/2017   Cerebrovascular accident (CVA) due to bilateral occlusion of middle cerebral arteries (HCC) 07/23/2017   Cerebral thrombosis with cerebral infarction (HCC) 11/12/2014   Amaurosis fugax 11/11/2014    ONSET DATE: 06/11/23  REFERRING DIAG: CVA  THERAPY DIAG:  No diagnosis found.  Rationale for Evaluation and Treatment: Rehabilitation  SUBJECTIVE:  SUBJECTIVE STATEMENT: Pt reports no new complaints and states he has no issues with the exercises given either. Pt accompanied by: self   PERTINENT HISTORY: Patient presents for evaluation s/p CVA; occlusion of R carotid artery.  Patient was hospitalized for a stroke on Feb 25th, 2025 and discharged Feb 27th. A stent was placed in R carotid artery.  R MCA territory stroke noted. PMH includes HTN, tobacco dependence, stroke (2016), squamous cell carcinoma of vocal cord (2022), Lcarotid occlusion (2019), R carotid stenosis, prior R parietal and septal ischemic infarcts with resiodual mild hemianopia. 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   PAIN:  Are you having pain? No  PRECAUTIONS: Fall  RED FLAGS: None   WEIGHT BEARING RESTRICTIONS: No; no lifting >5lb due to stent  FALLS: Has patient fallen in last 6 months? Yes. 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   PATIENT GOALS: continue to do the things I enjoy doing.    OBJECTIVE:  Note: Objective measures were completed at Evaluation unless otherwise noted.  DIAGNOSTIC FINDINGS:  06/11/23  IMPRESSION: 1. Emergent large vessel occlusion at the right cervical carotid. There is intracranial reconstitution at the ophthalmic segment. The right ICA feeds the bilateral anterior circulation in the setting of chronic left cavernous carotid occlusion and the right PCA in the setting of fetal type PCA circulation on the right. Ischemic perfusion is seen in the bilateral MCA territory. 15 cc area of infarct reported in the right cerebral hemisphere, although overestimated given tagging of a chronic parietal infarct. 2. Persistent trigeminal artery on the left. 3. Chronic high-grade narrowing at the right vertebral origin due to calcified plaque. 4. Advanced narrowing of the left subclavian after the vertebral origin with irregular plaque. 5. Note that the arch and great vessel ostia are not covered. 6. 50% narrowing of the distal cervical ICA on the left, progressed from 2019. 7. Emphysema with suspicious 14 mm nodule at the left upper lobe, most concerning for tumor, recommend chest CT after convalescence.  COGNITION: Overall cognitive status:  short term memory   SENSATION: Light touch: Impaired ; decreased LLE   COORDINATION: Finger nose test: relatively equal coordination of UE Heel shin test: decreased coordination of LLE  POSTURE: rounded shoulders  LOWER EXTREMITY ROM:    WFL  LOWER EXTREMITY MMT:    MMT Right Eval Left Eval  Hip flexion 7.8 4.9  Hip extension    Hip abduction 8.5 4.1  Hip adduction 6.9 3.1  Hip internal rotation    Hip external rotation    Knee flexion 8.9 8.2  Knee extension  9.5 8.2  Ankle dorsiflexion 10.4 8.3  Ankle plantarflexion 12.8 10.8  Ankle inversion    Ankle eversion    (Blank rows = not tested)    TRANSFERS: Assistive device utilized: None  Sit to stand: SBA Stand to sit: SBA Chair to chair: SBA  STAIRS: Level of Assistance: CGA Stair Negotiation Technique: Alternating Pattern  with Single Rail on Right Number of Stairs: 5  Height of Stairs: 6"  Comments: limited spatial awareness of LLE  GAIT: Gait pattern: decreased arm swing- Left, decreased stance time- Left, decreased ankle dorsiflexion- Left, and circumduction- Left Distance walked: 60 Assistive device utilized: None Level of assistance: SBA  and CGA Comments: snake type patterning of L foot with slight abduction  FUNCTIONAL TESTS:  5 times sit to stand: 16.52 10 meter walk test: 9.07 seconds  Berg Balance Scale: performed FGA instead FGA: 18/30    PATIENT SURVEYS:  LEFS 66                                                                                                                              TREATMENT DATE: 07/03/23  *** airex pad, dual task, coordination, strength  TherEx: To improve strength, endurance, mobility, and function of specific targeted muscle groups or improve joint range of motion or improve muscle flexibility  Seated hamstring curls with BTB, 2x10 each LE Seated resisted marching with BTB, 2x10 each LE Seated clamshells with BTB, 2x10 each LE Seated leg press, 85#, 3x10   TherAct: To improve functional movements patterns for everyday tasks  Cable column walkouts, forward/latera/backward, 12.5#, x5 each direction   Neuromuscular Re-Ed: To facilitate reeducation of movement, balance, posture, coordination, and/or proprioception/kinesthetic sense.       PATIENT EDUCATION: Education details: goals, POC  Person educated: Patient Education method: Explanation, Demonstration, Tactile cues, and Verbal cues Education comprehension: verbalized  understanding, returned demonstration, verbal cues required, tactile cues required, and needs further education  HOME EXERCISE PROGRAM: HEP: Access Code: [email or web address] URL: [email or web address] Date: 06/20/2023 Prepared by: Precious Bard  Exercises - Seated Ankle Alphabet  - 1 x daily - 7 x weekly - 2 sets - 1 reps - 5 hold - Seated Marching with Opposite Shoulder Flexion  - 1 x daily - 7 x weekly - 2 sets - 10 reps - 5 hold  GOALS: Goals reviewed with patient? Yes  SHORT TERM GOALS: Target date: 07/04/2023   Patient will be independent in home exercise program to improve strength/mobility for better functional independence with ADLs. Baseline: Goal status: INITIAL   LONG TERM GOALS: Target date: 09/12/2023  Patient will increase Functional Gait Assessment score to >26/30 as to reduce fall risk and improve dynamic gait safety with community ambulation. Baseline: 3/6: 18/56 Goal status: INITIAL  2.  Patient will increase lower extremity functional scale to >75/80 to demonstrate improved functional mobility and increased tolerance with ADLs.  Baseline:  3/6: 66/80  Goal status: INITIAL  3.  Patient will increase BLE gross strength to 4+/5  and/or within 2lb of force as primary limb as to improve functional strength for independent gait, increased standing tolerance and increased ADL ability. Baseline: 3/6: see above Goal status: INITIAL  4.   Patient (< 64 years old) will complete five times sit to stand test in < 10 seconds indicating an increased LE strength and improved balance. Baseline: 16.52 Goal status: INITIAL    ASSESSMENT:  CLINICAL IMPRESSION:  Pt has made significant improvements in strength, vision, and ability to ambulate without any balance concerns.  Pt able to tolerate strengthening exercises without much difficulty as well.  Pt noted to have some instability  with resisted ambulation at times, but was abl to self-correct and continue performance.   Pt likely not needing much more therapy with progression made over the past couple of days.  Will attempt to perform more balance related activities at next session.  Pt will continue to benefit from skilled therapy to address remaining deficits in order to improve overall QoL and return to PLOF.      OBJECTIVE IMPAIRMENTS: Abnormal gait, cardiopulmonary status limiting activity, decreased activity tolerance, decreased balance, decreased coordination, decreased endurance, decreased mobility, difficulty walking, decreased strength, impaired perceived functional ability, impaired sensation, impaired vision/preception, improper body mechanics, and postural dysfunction.   ACTIVITY LIMITATIONS: carrying, lifting, bending, standing, squatting, stairs, transfers, bed mobility, bathing, dressing, self feeding, reach over head, hygiene/grooming, and locomotion level  PARTICIPATION LIMITATIONS: meal prep, cleaning, driving, shopping, community activity, occupation, and yard work  PERSONAL FACTORS: Age, Fitness, Past/current experiences, Time since onset of injury/illness/exacerbation, Transportation, and 3+ comorbidities: [place], tobacco dependence, stroke (2016), squamous cell carcinoma of vocal cord (2022), Lcarotid occlusion (2019), R carotid stenosis, prior R parietal and septal ischemic infarcts with resiodual mild hemianopia.   are also affecting patient's functional outcome.   REHAB POTENTIAL: Good  CLINICAL DECISION MAKING: Evolving/moderate complexity  EVALUATION COMPLEXITY: Moderate  PLAN:  PT FREQUENCY: 2x/week  PT DURATION: 12 weeks  PLANNED INTERVENTIONS: 97164- PT Re-evaluation, 97110-Therapeutic exercises, 97530- Therapeutic activity, 97112- Neuromuscular re-education, 97535- Self Care, 16109- Manual therapy, [phone]- Gait training, [phone]- Orthotic Fit/training, [phone]- Canalith repositioning, [id number]- Electrical stimulation (unattended), [phone]- Electrical stimulation (manual), [id number]- Ultrasound,  [id number]- Traction (mechanical), [id number]- Ionotophoresis 4mg /ml Dexamethasone, Patient/Family education, Balance training, Stair training, Taping, Dry Needling, Joint mobilization, Spinal mobilization, Scar mobilization, Compression bandaging, Vestibular training, Visual/preceptual remediation/compensation, Cognitive remediation, DME instructions, Cryotherapy, Moist heat, and Biofeedback  PLAN FOR NEXT SESSION: airex pad, dual task, coordination, strength   Norman Herrlich PT ,DPT Physical Therapist- [place]  [facility]   07/03/23, 9:30 AM

## 2023-07-10 ENCOUNTER — Ambulatory Visit: Admitting: Physical Therapy

## 2023-07-10 ENCOUNTER — Ambulatory Visit: Admitting: Occupational Therapy

## 2023-07-10 DIAGNOSIS — M6281 Muscle weakness (generalized): Secondary | ICD-10-CM

## 2023-07-10 NOTE — Therapy (Signed)
 OUTPATIENT PHYSICAL THERAPY NEURO TREATMENT   Patient Name: Henry Boone MRN: [id number] DOB:1965-12-15, 58 y.o., male Today's Date: 07/10/2023   PCP: Marisue Ivan REFERRING PROVIDER: Marisue Ivan   END OF SESSION:  PT End of Session - 07/10/23 0903     Visit Number 3    Number of Visits 24    Date for PT Re-Evaluation 09/12/23    Progress Note Due on Visit 10    PT Start Time 0857    PT Stop Time 0929    PT Time Calculation (min) 32 min    Equipment Utilized During Treatment Gait belt    Activity Tolerance Patient tolerated treatment well    Behavior During Therapy WFL for tasks assessed/performed               Past Medical History:  Diagnosis Date   ETOH abuse    History of cerebral artery stenosis    Hyperlipidemia    Hypertension    Stroke [facility])    Past Surgical History:  Procedure Laterality Date   COLONOSCOPY WITH PROPOFOL N/A 01/20/2019   Procedure: COLONOSCOPY WITH PROPOFOL;  Surgeon: Toledo, Boykin Nearing, MD;  Location: [facility] ENDOSCOPY;  Service: Gastroenterology;  Laterality: N/A;   IR ANGIO EXTRACRAN SEL COM CAROTID INNOMINATE UNI BILAT MOD SED  07/23/2017   IR ANGIO INTRA EXTRACRAN SEL COM CAROTID INNOMINATE UNI R MOD SED  11/07/2017   IR ANGIO VERTEBRAL SEL SUBCLAVIAN INNOMINATE UNI R MOD SED  11/07/2017   IR ANGIO VERTEBRAL SEL VERTEBRAL BILAT MOD SED  07/23/2017   IR CT HEAD LTD  06/11/2023   IR CT HEAD LTD  06/11/2023   IR INTRA CRAN STENT  06/11/2023   IR PERCUTANEOUS ART THROMBECTOMY/INFUSION INTRACRANIAL INC DIAG ANGIO  06/11/2023   IR RADIOLOGIST EVAL & MGMT  06/28/2023   MICROLARYNGOSCOPY N/A 06/30/2019   Procedure: SUSPENSION MICROLARYNGOSCOPY WITH MICROFLAP;  Surgeon: Bud Face, MD;  Location: [facility] ORS;  Service: ENT;  Laterality: N/A;   RADIOLOGY WITH ANESTHESIA N/A 11/07/2017   Procedure: STENTING;  Surgeon: Julieanne Cotton, MD;  Location: [facility] OR;  Service: Radiology;  Laterality: N/A;   RADIOLOGY WITH ANESTHESIA N/A 06/11/2023    Procedure: IR WITH ANESTHESIA;  Surgeon: Radiologist, Medication, MD;  Location: [facility] OR;  Service: Radiology;  Laterality: N/A;   TEE WITHOUT CARDIOVERSION N/A 11/15/2014   Procedure: TRANSESOPHAGEAL ECHOCARDIOGRAM (TEE);  Surgeon: Antonieta Iba, MD;  Location: [facility] ORS;  Service: Cardiovascular;  Laterality: N/A;   TONSILLECTOMY     Patient Active Problem List   Diagnosis Date Noted   Acute cerebrovascular accident (CVA) due to occlusion of right carotid artery (HCC) 06/11/2023   Internal carotid artery occlusion, right 06/11/2023   Carotid occlusion, left 07/25/2017   Carotid stenosis, right 07/25/2017   History of ischemic multifocal multiple vascular territories stroke 07/25/2017   Essential hypertension 07/25/2017   Hyperlipidemia 07/25/2017   Tobacco use disorder 07/25/2017   Leukocytosis 07/25/2017   Cerebrovascular accident (CVA) due to bilateral occlusion of middle cerebral arteries (HCC) 07/23/2017   Cerebral thrombosis with cerebral infarction (HCC) 11/12/2014   Amaurosis fugax 11/11/2014    ONSET DATE: 06/11/23  REFERRING DIAG: CVA  THERAPY DIAG:  Muscle weakness (generalized)  Rationale for Evaluation and Treatment: Rehabilitation  SUBJECTIVE:  SUBJECTIVE STATEMENT:  Patient reports he has been doing well and feels he is no longer having any issues with his balance.  Has been able to walk and complete normal activities without difficulty over the last couple weeks.  Pt accompanied by: self   PERTINENT HISTORY: Patient presents for evaluation s/p CVA; occlusion of R carotid artery.  Patient was hospitalized for a stroke on Feb 25th, 2025 and discharged Feb 27th. A stent was placed in R carotid artery.  R MCA territory stroke noted. PMH includes HTN, tobacco dependence, stroke (2016),  squamous cell carcinoma of vocal cord (2022), Lcarotid occlusion (2019), R carotid stenosis, prior R parietal and septal ischemic infarcts with resiodual mild hemianopia. 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   PAIN:  Are you having pain? No  PRECAUTIONS: Fall  RED FLAGS: None   WEIGHT BEARING RESTRICTIONS: No; no lifting >5lb due to stent  FALLS: Has patient fallen in last 6 months? Yes. 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   PATIENT GOALS: continue to do the things I enjoy doing.    OBJECTIVE:  Note: Objective measures were completed at Evaluation unless otherwise noted.  DIAGNOSTIC FINDINGS:  06/11/23  IMPRESSION: 1. Emergent large vessel occlusion at the right cervical carotid. There is intracranial reconstitution at the ophthalmic segment. The right ICA feeds the bilateral anterior circulation in the setting of chronic left cavernous carotid occlusion and the right PCA in the setting of fetal type PCA circulation on the right. Ischemic perfusion is seen in the bilateral MCA territory. 15 cc area of infarct reported in the right cerebral hemisphere, although overestimated given tagging of a chronic parietal infarct. 2. Persistent trigeminal artery on the left. 3. Chronic high-grade narrowing at the right vertebral origin due to calcified plaque. 4. Advanced narrowing of the left subclavian after the vertebral origin with irregular plaque. 5. Note that the arch and great vessel ostia are not covered. 6. 50% narrowing of the distal cervical ICA on the left, progressed from 2019. 7. Emphysema with suspicious 14 mm nodule at the left upper lobe, most concerning for tumor, recommend chest CT after  convalescence.  COGNITION: Overall cognitive status:  short term memory   SENSATION: Light touch: Impaired ; decreased LLE   COORDINATION: Finger nose test: relatively equal coordination of UE Heel shin test: decreased coordination of LLE  POSTURE: rounded shoulders  LOWER EXTREMITY ROM:    WFL  LOWER EXTREMITY MMT:    MMT Right Eval Left Eval  Hip flexion 7.8 4.9  Hip extension    Hip abduction 8.5 4.1  Hip adduction 6.9 3.1  Hip internal rotation    Hip external rotation    Knee flexion 8.9 8.2  Knee extension 9.5 8.2  Ankle dorsiflexion 10.4 8.3  Ankle plantarflexion 12.8 10.8  Ankle inversion    Ankle eversion    (Blank rows = not tested)    TRANSFERS: Assistive device utilized: None  Sit to stand: SBA Stand to sit: SBA Chair to chair: SBA  STAIRS: Level of Assistance: CGA Stair Negotiation Technique: Alternating Pattern  with Single Rail on Right Number of Stairs: 5  Height of Stairs: 6"  Comments: limited spatial awareness of LLE  GAIT: Gait pattern: decreased arm swing- Left,  decreased stance time- Left, decreased ankle dorsiflexion- Left, and circumduction- Left Distance walked: 60 Assistive device utilized: None Level of assistance: SBA and CGA Comments: snake type patterning of L foot with slight abduction  FUNCTIONAL TESTS:  5 times sit to stand: 16.52 10 meter walk test: 9.07 seconds  Berg Balance Scale: performed FGA instead FGA: 18/30    PATIENT SURVEYS:  LEFS 66                                                                                                                              TREATMENT DATE: 07/10/23  PHYSICAL PERFORMANCE   [facility] PT Assessment - 07/10/23 0001       Functional Gait  Assessment   Gait assessed  Yes    Gait Level Surface Walks 20 ft in less than 5.5 sec, no assistive devices, good speed, no evidence for imbalance, normal gait pattern, deviates no more than 6 in outside of the 12 in walkway width.     Change in Gait Speed Able to smoothly change walking speed without loss of balance or gait deviation. Deviate no more than 6 in outside of the 12 in walkway width.    Gait with Horizontal Head Turns Performs head turns smoothly with no change in gait. Deviates no more than 6 in outside 12 in walkway width    Gait with Vertical Head Turns Performs head turns with no change in gait. Deviates no more than 6 in outside 12 in walkway width.    Gait and Pivot Turn Pivot turns safely within 3 sec and stops quickly with no loss of balance.    Step Over Obstacle Is able to step over 2 stacked shoe boxes taped together (9 in total height) without changing gait speed. No evidence of imbalance.    Gait with Narrow Base of Support Is able to ambulate for 10 steps heel to toe with no staggering.    Gait with Eyes Closed Walks 20 ft, uses assistive device, slower speed, mild gait deviations, deviates 6-10 in outside 12 in walkway width. Ambulates 20 ft in less than 9 sec but greater than 7 sec.    Ambulating Backwards Walks 20 ft, uses assistive device, slower speed, mild gait deviations, deviates 6-10 in outside 12 in walkway width.    Steps Alternating feet, no rail.    Total Score 28            FGA score indicates decreased risk for falls.   Five times Sit to Stand Test (FTSS)  TIME: 14.46  sec  Cut off scores indicative of increased fall risk: >12 sec CVA, >16 sec PD, >13 sec vestibular (ANPTA Core Set of Outcome Measures for Adults with Neurologic Conditions, 2018)  TherAct: To improve functional movements patterns for everyday tasks  Cable column walkouts, forward/ and back holding baseball handle for mimic of dog walking x 5 rounds      PATIENT EDUCATION: Education details: goals, POC  Person educated: Patient Education method: Explanation, Demonstration, Tactile cues, and Verbal cues Education comprehension: verbalized understanding, returned demonstration, verbal cues required, tactile  cues required, and needs further education  HOME EXERCISE PROGRAM: Access Code: [email or web address] URL: [email or web address] Date: 07/10/2023 Prepared by: Thresa Ross  Exercises - Backwards Walking  - 1 x daily - 5 x weekly - 10 reps - Single Leg Stance  - 1 x daily - 7 x weekly - 2 sets - 30 seconds  hold  GOALS: Goals reviewed with patient? Yes  SHORT TERM GOALS: Target date: 07/04/2023   Patient will be independent in home exercise program to improve strength/mobility for better functional independence with ADLs. Baseline: Goal status: INITIAL   LONG TERM GOALS: Target date: 09/12/2023  Patient will increase Functional Gait Assessment score to >26/30 as to reduce fall risk and improve dynamic gait safety with community ambulation. Baseline: 3/6: 18/56 3/26:28 Goal status: MET  2.  Patient will increase lower extremity functional scale to >75/80 to demonstrate improved functional mobility and increased tolerance with ADLs.  Baseline:  3/6: 66/80 3/26:74/80 Goal status: NOT MET much progressed   3.  Patient will increase BLE gross strength to 4+/5  and/or within 2lb of force as primary limb as to improve functional strength for independent gait, increased standing tolerance and increased ADL ability. Baseline: 3/6: see above Goal status: INITIAL  4.   Patient (< 55 years old) will complete five times sit to stand test in < 10 seconds indicating an increased LE strength and improved balance. Baseline: 16.52  3/26:14.46 sec, no UE assist  Goal status: NOT MET/ PROGRESSED     ASSESSMENT:  CLINICAL IMPRESSION:  Pt reports feeling pretty much back to normal and is very pleased with care in PT thus far. Pt has progressed to meet his balance related goal and hosed progress with LE strength. Pt is agreeable to continue with activities to improve balance outside of PT. Pt will be discharged form formal PT treatment this date. New HEP provided for home use.      OBJECTIVE IMPAIRMENTS: Abnormal gait, cardiopulmonary status limiting activity, decreased activity tolerance, decreased balance, decreased coordination, decreased endurance, decreased mobility, difficulty walking, decreased strength, impaired perceived functional ability, impaired sensation, impaired vision/preception, improper body mechanics, and postural dysfunction.   ACTIVITY LIMITATIONS: carrying, lifting, bending, standing, squatting, stairs, transfers, bed mobility, bathing, dressing, self feeding, reach over head, hygiene/grooming, and locomotion level  PARTICIPATION LIMITATIONS: meal prep, cleaning, driving, shopping, community activity, occupation, and yard work  PERSONAL FACTORS: Age, Fitness, Past/current experiences, Time since onset of injury/illness/exacerbation, Transportation, and 3+ comorbidities: [place], tobacco dependence, stroke (2016), squamous cell carcinoma of vocal cord (2022), Lcarotid occlusion (2019), R carotid stenosis, prior R parietal and septal ischemic infarcts with resiodual mild hemianopia.   are also affecting patient's functional outcome.   REHAB POTENTIAL: Good  CLINICAL DECISION MAKING: Evolving/moderate complexity  EVALUATION COMPLEXITY: Moderate  PLAN:  PT FREQUENCY: 2x/week  PT DURATION: 12 weeks  PLANNED INTERVENTIONS: 97164- PT Re-evaluation, 97110-Therapeutic exercises, 97530- Therapeutic activity, 97112- Neuromuscular re-education, 97535- Self Care, 40981- Manual therapy, [phone]- Gait training, [phone]- Orthotic Fit/training, [phone]- Canalith repositioning, [id number]- Electrical stimulation (unattended), [phone]- Electrical stimulation (manual), [id number]- Ultrasound, 52841- Traction (mechanical), [id number]- Ionotophoresis 4mg /ml Dexamethasone, Patient/Family education, Balance training, Stair training, Taping, Dry Needling, Joint mobilization, Spinal mobilization, Scar mobilization, Compression bandaging, Vestibular training, Visual/preceptual  remediation/compensation, Cognitive remediation, DME instructions, Cryotherapy, Moist heat, and Biofeedback  PLAN FOR NEXT SESSION: D/C from PT this visit    Norman Herrlich  PT ,DPT Physical Therapist-   [facility]   07/10/23, 9:04 AM

## 2023-07-10 NOTE — Therapy (Signed)
 OUTPATIENT OCCUPATIONAL THERAPY NEURO TREATMENT/DISCHARGE NOTE  Patient Name: Henry Boone MRN: [id number] DOB:[birth year], 58 y.o., male Today's Date: 07/10/2023  PCP: Dr. Burnadette Boone REFERRING PROVIDER: Debbra Riding, PA-C  END OF SESSION:  OT End of Session - 07/10/23 0936     Visit Number 5    Date for OT Re-Evaluation 09/12/23    OT Start Time 0930    OT Stop Time 1015    OT Time Calculation (min) 45 min    Activity Tolerance Patient tolerated treatment well    Behavior During Therapy [facility] for tasks assessed/performed            Past Medical History:  Diagnosis Date   ETOH abuse    History of cerebral artery stenosis    Hyperlipidemia    Hypertension    Stroke [facility])    Past Surgical History:  Procedure Laterality Date   COLONOSCOPY WITH PROPOFOL N/A 01/20/2019   Procedure: COLONOSCOPY WITH PROPOFOL;  Surgeon: Henry Boone, Henry Nearing, MD;  Location: [facility] ENDOSCOPY;  Service: Gastroenterology;  Laterality: N/A;   IR ANGIO EXTRACRAN SEL COM CAROTID INNOMINATE UNI BILAT MOD SED  07/23/2017   IR ANGIO INTRA EXTRACRAN SEL COM CAROTID INNOMINATE UNI R MOD SED  11/07/2017   IR ANGIO VERTEBRAL SEL SUBCLAVIAN INNOMINATE UNI R MOD SED  11/07/2017   IR ANGIO VERTEBRAL SEL VERTEBRAL BILAT MOD SED  07/23/2017   IR CT HEAD LTD  06/11/2023   IR CT HEAD LTD  06/11/2023   IR INTRA CRAN STENT  06/11/2023   IR PERCUTANEOUS ART THROMBECTOMY/INFUSION INTRACRANIAL INC DIAG ANGIO  06/11/2023   IR RADIOLOGIST EVAL & MGMT  06/28/2023   MICROLARYNGOSCOPY N/A 06/30/2019   Procedure: SUSPENSION MICROLARYNGOSCOPY WITH MICROFLAP;  Surgeon: Henry Face, MD;  Location: [facility] ORS;  Service: ENT;  Laterality: N/A;   RADIOLOGY WITH ANESTHESIA N/A 11/07/2017   Procedure: STENTING;  Surgeon: Henry Cotton, MD;  Location: [facility] OR;  Service: Radiology;  Laterality: N/A;   RADIOLOGY WITH ANESTHESIA N/A 06/11/2023   Procedure: IR WITH ANESTHESIA;  Surgeon: Radiologist, Medication, MD;  Location: [facility] OR;  Service:  Radiology;  Laterality: N/A;   TEE WITHOUT CARDIOVERSION N/A 11/15/2014   Procedure: TRANSESOPHAGEAL ECHOCARDIOGRAM (TEE);  Surgeon: Henry Iba, MD;  Location: [facility] ORS;  Service: Cardiovascular;  Laterality: N/A;   TONSILLECTOMY     Patient Active Problem List   Diagnosis Date Noted   Acute cerebrovascular accident (CVA) due to occlusion of right carotid artery (HCC) 06/11/2023   Internal carotid artery occlusion, right 06/11/2023   Carotid occlusion, left 07/25/2017   Carotid stenosis, right 07/25/2017   History of ischemic multifocal multiple vascular territories stroke 07/25/2017   Essential hypertension 07/25/2017   Hyperlipidemia 07/25/2017   Tobacco use disorder 07/25/2017   Leukocytosis 07/25/2017   Cerebrovascular accident (CVA) due to bilateral occlusion of middle cerebral arteries (HCC) 07/23/2017   Cerebral thrombosis with cerebral infarction (HCC) 11/12/2014   Amaurosis fugax 11/11/2014   ONSET DATE: 06/11/23  REFERRING DIAG: I63.9 (ICD-10-CM) - Cerebral infarction, unspecified   THERAPY DIAG:  Muscle weakness (generalized)  Rationale for Evaluation and Treatment: Rehabilitation  SUBJECTIVE:   SUBJECTIVE STATEMENT:  Pt. reports feeling close to baseline Pt accompanied by: self  PERTINENT HISTORY: Per medical record from MD visit on 06/18/23: History of Present Illness Henry Boone is a 58 year old male with a history of stroke who presents for follow-up after recent hospitalization for another stroke.  He was hospitalized for a stroke on February 25th and discharged on  February 27th. During the hospital stay, a stent was placed in the right carotid artery due to stenosis. Right MCA territory stroke noted due to stenosis of the carotid artery he continues to experience numbness on the left side of his body, which has improved somewhat. His upper left leg feels normal, but he describes a sensation of walking on a pillow. His arm feels almost normal, but he experiences  persistent numbness from the side of his neck down to his shoulder, which he finds frustrating.  He is currently on new medications, including amlodipine, which he has been taking for a week. His blood pressure has never been lower, averaging 117/79, and he experiences no side effects. Previously, with encephalol, he experienced lightheadedness upon standing after sitting for long periods.  He has a history of carotid artery stenosis and is scheduled for follow-up appointments with radiology and neurology. He has not yet scheduled rehabilitation therapy.  He reports some short-term memory issues and increased irritability, which he attributes to frustration. He describes difficulty with tasks such as making coffee and feeding his dogs due to coordination issues. He has not been using his computer, which was a regular pastime, and has been taking naps in the afternoon, which is unusual for him.   Per chart, pt also reports difficulty with tasks such as using a spoon.  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   PRECAUTIONS: None  WEIGHT BEARING RESTRICTIONS: No  PAIN:  Are you having pain? No  FALLS: Has patient fallen in last 6 months? Yes. Number of falls at least 2 (pt attributes it to numbness from CVA)  LIVING ENVIRONMENT: Lives with: lives with their spouse Lives in: 1 level home Stairs: Yes: External: 2 steps; none Has following equipment at home: None  PLOF: Independent, works full time as a Scientist, physiological.  Enjoys cooking.    PATIENT GOALS: "I want to make sure I'm back to normal or as close as I can be."   OBJECTIVE:  Note: Objective measures were completed at Evaluation unless otherwise noted.  HAND DOMINANCE: Ambidextrous, but favors the L.    ADLs: Overall ADLs: Pt describes increased time with visual/spatial components of ADLs and some difficulties with  sequencing Transfers/ambulation related to ADLs: indep Eating: Able to cut food; though reports struggling to understand which way to turn his spoon the other day, and tipping his coffee cup away from him and spilling rather than tipping it toward his mouth to take a sip Grooming: not yet doing his own shaving d/t fear of error or cutting  UB Dressing: difficulty trying to "figure out how to get the L arm in the sleeve." LB Dressing: indep, reporting no difficulties Toileting: indep Bathing: indep; reports no difficulties with sequencing through steps of bathing Tub Shower transfers: increased caution to step over tub d/t LLE paresthesias Equipment: none  IADLs: words on shirt appeared backwards, spoon appeared upside down  Shopping: Not yet attempted Light housekeeping: indep Meal Prep: spouse currently managing; pt is typically the one who cooks at baseline Community mobility: spouse currently driving Medication management: indep Landscape architect: indep  Handwriting: Increased time  MOBILITY STATUS: indep without AD; see PT eval for additional details  POSTURE COMMENTS:  No Significant postural limitations  ACTIVITY TOLERANCE: Activity tolerance: WFL for tasks assessed  FUNCTIONAL OUTCOME MEASURES: Plan to complete MVPT in upcoming session   UPPER EXTREMITY  ROM:  WNL  UPPER EXTREMITY MMT:   5/5  HAND FUNCTION: Grip strength: Right: 79 lbs; Left: 76 lbs, Lateral pinch: Right: 26 lbs, Left: 24 lbs, and 3 point pinch: Right: 18 lbs, Left: 14 lbs  COORDINATION: 9 Hole Peg test: Right: 33 sec; Left: 39 sec  07/10/23:  9 Hole Peg test: Right: 30 sec; Left: 30 sec  SENSATION: Pt reports numbness in L leg, L hip, L side of torso (anterior and posterior); hx of numbness in ulnar digits of R hand from previous CVA in 2019.  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  Pt also reports struggling to figure out how to put his L arm into his shirt sleeve, and when reading the words on a shirt that his wife held up for him, pt asked spouse why the letters had been reversed.  Patient has difficulty with following activities due to following visual impairments: dressing, meal prep, self feeding  PERCEPTION: Impaired: Body scheme: TBD and Spatial orientation: TBD; Further assessment needed  PRAXIS: Impaired: Motor planning; mild LUE apraxia and ataxia                                                                                                                     TREATMENT DATE: 07/10/23  Therapeutic Activities:   Completed Flip-Flop Targets worksheet for mirror images in 4 quadrants. Accurately completed 10/10 mirror images from the left to the right upper quadrant  with 100% accuracy. Accurately completed  100% of mages from the upper left to the bottom left. Accurately completed 100% of mirror images from the upper left to the lower right quadrant. Pt. Was able to  initiate, and self correct 1-2 images prior to completing. -Pt. Education was provided about a HEP for  [facility] hand activities.  There. Ex:  -Performed BUE strengthening  using blue theraband for bilateral shoulder flexion, abduction, diagonal flexion, and elbow flexion, and extension following a  HEP    PATIENT EDUCATION: Education details: HEP for UE strengthening with blue theraband, FMC hand activities Person educated: Patient Education method: Explanation Education comprehension: verbalized understanding  HOME EXERCISE PROGRAM: To be initiated in upcoming sessions  GOALS: Goals reviewed with patient? Yes  SHORT TERM GOALS: Target date: 08/01/23  Pt will be indep to perform HEP for improving LUE GMC/FMC skills. Baseline: D/C Independent. Eval: HEP  not yet initiated Goal status: MET  LONG TERM GOALS: Target date: 09/12/23  Pt will be indep to correctly sequence a 3-4 step task to enable indep with making a cup of coffee. Baseline:  D/C: Independent. Eval: Pt reports he couldn't figure out how to sequence making his coffee, and this task required 5 attempts before successful completion Goal status: MET  2.   Pt will increase L hand [facility] skills in order to increase typing speed for work efficiency. Baseline: D/C: 9 Hole Peg test: Right: 30 sec; Left: 30 sec Eval: Will assess typing speed in upcoming session Goal status: MET  3.   Pt will complete MVPT to identify more specific areas of visual processing deficits. Baseline: D/C: Completed. Eval: Plan to complete next session Goal status: MET  4.   Pt will utilize visual compensatory strategies as needed in order to don a shirt with modified indep and good efficiency. Baseline: D/C: Independent. Eval: Pt. describes difficulty figuring out how to thread his L arm into his shirt sleeve Goal status: INITIAL  ASSESSMENT:  CLINICAL IMPRESSION:  Pt. tolerated the UE exercises with blue theraband well today. Pt. was able to complete 100% of flip-flop charts with initiating self corrections as needed prior to completion. Pt. has improved with UE functioning, FMC skills, visual perceptual functioning, and ADLs, and IADL functioning. All goals have been met. Pt. is now ready for discharge from OT services.   PERFORMANCE  DEFICITS: in functional skills including ADLs, IADLs, coordination, dexterity, sensation, Fine motor control, Gross motor control, vision, and UE functional use, cognitive skills including memory, perception, problem solving, and sequencing, and psychosocial skills including coping strategies, environmental adaptation, habits, and routines and behaviors.   IMPAIRMENTS: are limiting patient from ADLs, IADLs, work, and leisure.   CO-MORBIDITIES: has co-morbidities such as 2 previous CVAs, hx ETOH abuse, HTN  that affects occupational performance. Patient will benefit from skilled OT to address above impairments and improve overall function.  MODIFICATION OR ASSISTANCE TO COMPLETE EVALUATION: No modification of tasks or assist necessary to complete an evaluation.  OT OCCUPATIONAL PROFILE AND HISTORY: Detailed assessment: Review of records and additional review of physical, cognitive, psychosocial history related to current functional performance.  CLINICAL DECISION MAKING: Moderate - several treatment options, min-mod task modification necessary  REHAB POTENTIAL: Good  EVALUATION COMPLEXITY: Moderate    PLAN:  OT FREQUENCY: 2x/week  OT DURATION: 12 weeks  PLANNED INTERVENTIONS: 97168 OT Re-evaluation, 97535 self care/ADL training, 67341 therapeutic exercise, 97530 therapeutic activity, 97112 neuromuscular re-education, [id number] Cognitive training (first 15 min), 93790 Cognitive training(each additional 15 min), visual/perceptual remediation/compensation, psychosocial skills training, coping strategies training, patient/family education, and DME and/or AE instructions  RECOMMENDED OTHER SERVICES: None at this time (PT evaluation this date)  CONSULTED AND AGREED WITH PLAN OF CARE: Patient  PLAN FOR NEXT SESSION: See above  Olegario Messier, MS, OTR/L   07/10/2023, 10:08 AM

## 2023-07-12 ENCOUNTER — Ambulatory Visit: Admitting: Physical Therapy

## 2023-07-12 ENCOUNTER — Ambulatory Visit

## 2023-07-16 ENCOUNTER — Ambulatory Visit: Admitting: Physical Therapy

## 2023-07-16 ENCOUNTER — Ambulatory Visit: Admitting: Occupational Therapy

## 2023-07-18 ENCOUNTER — Ambulatory Visit

## 2023-07-23 ENCOUNTER — Ambulatory Visit: Admitting: Physical Therapy

## 2023-07-23 ENCOUNTER — Encounter: Admitting: Occupational Therapy

## 2023-07-26 ENCOUNTER — Encounter

## 2023-07-26 ENCOUNTER — Ambulatory Visit: Admitting: Physical Therapy

## 2023-07-29 ENCOUNTER — Encounter

## 2023-07-29 ENCOUNTER — Ambulatory Visit: Admitting: Physical Therapy

## 2023-07-31 ENCOUNTER — Ambulatory Visit: Admitting: Physical Therapy

## 2023-07-31 ENCOUNTER — Encounter: Admitting: Occupational Therapy

## 2023-08-06 ENCOUNTER — Encounter: Admitting: Occupational Therapy

## 2023-08-06 ENCOUNTER — Ambulatory Visit: Admitting: Physical Therapy

## 2023-08-06 ENCOUNTER — Encounter: Payer: Self-pay | Admitting: Family Medicine

## 2023-08-06 ENCOUNTER — Ambulatory Visit: Admitting: Family Medicine

## 2023-08-06 VITALS — BP 160/99 | HR 85 | Ht 72.0 in | Wt 140.5 lb

## 2023-08-06 DIAGNOSIS — I639 Cerebral infarction, unspecified: Secondary | ICD-10-CM

## 2023-08-06 DIAGNOSIS — I499 Cardiac arrhythmia, unspecified: Secondary | ICD-10-CM

## 2023-08-06 NOTE — Patient Instructions (Signed)
 Below is our plan:  Stroke:  right MCA scattered infarcts with right cavernous ICA occlusion s/p IR and stenting with TICI2c, etiology: Large vessel disease: Residual deficit: left sided numbness. Continue aspirin  81 mg daily and Brilinta  (ticagrelor ) 90 mg bid until 09/08/2023 and pravastatin  40mg  daily for secondary stroke prevention. Clarify with Dr Alvira Josephs if he wants you to take Brilinta  longer. Discussed secondary stroke prevention measures and importance of close PCP follow up for aggressive stroke risk factor management. I have gone over the pathophysiology of stroke, warning signs and symptoms, risk factors and their management in some detail with instructions to go to the closest emergency room for symptoms of concern. HTN: BP goal <130/90. Elevated today. Hx of white coat syndrome. Home readings <120/80. Continue amlodipine  5mg  daily per PCP. May consider increased dose pending home BP readings.  HLD: LDL goal <70. Recent LDL 68. Continue pravastatin  40mg  daily per PCP.  DMII: A1c goal<7.0. Recent A1c 5.7. Prediabetic. Continue to monitor closely per PCP. Low carb diet and regular exercise advised.  Irregular heart beat: I will order a cardiac monitor. Please let me know if you do not hear back to schedule.  Tobacco use: continue cessation efforts. Wean dose of nicotine  per PCP.   Please call [facility] today to inquire about refill of Brilinta . Please let me know if you are not able to get this medication.   Goals:  1) Maintain strict control of hypertension with blood pressure goal below 130/90 2) Maintain good control of diabetes with hemoglobin A1c goal below 7%  3) Maintain good control of lipids with LDL cholesterol goal below 70 mg/dL.  4) Eat a healthy diet with plenty of whole grains, cereals, fruits and vegetables, exercise regularly and maintain ideal body weight   Resources: [email or web address]  Please make  sure you are staying well hydrated. I recommend 50-60 ounces daily. Well balanced diet and regular exercise encouraged. Consistent sleep schedule with 6-8 hours recommended.   Please continue follow up with care team as directed.   Follow up with me in 6 months   You may receive a survey regarding today's visit. I encourage you to leave honest feed back as I do use this information to improve patient care. Thank you for seeing me today!

## 2023-08-06 NOTE — Progress Notes (Signed)
 [facility] [address] [place]. [place] [address] [phone]       HOSPITAL FOLLOW UP NOTE  Mr. Henry Boone Date of Birth:  1965/09/10 Medical Record Number:  [id number]   Reason for Referral:  hospital stroke follow up    SUBJECTIVE:   CHIEF COMPLAINT:  Chief Complaint  Patient presents with   Follow-up    Pt in room 1 alone. Here for hospital CVA follow up. Pt reports 3rd stroke, pt reports doing well.  Completed PT/OT, no weakness, c/o left side numbness from head to feet.     HPI:   Henry Boone is a 58 y.o. who  has a past medical history of ETOH abuse, History of cerebral artery stenosis, Hyperlipidemia, Hypertension, and Stroke (HCC).  Patient presented on 06/11/2023 for new onset left sided weakness and numbness. CT showed age-indeterminate tiny anterior right frontal cortex infarct. CTA head & neck were obtained and revealed emergent large vessel occlusion at the right cervical carotid. CT perfusion revealed ischemic perfusion in the bilateral MCA territory. 15 ml area of infarct reported in the right cerebral hemisphere, chronic high-grade narrowing at the right vertebral origin due to calcified plaque.  He underwent interventional radiology for clued right ICA with TICI 2C revascularization, with 2 areas of arterial sclerosis related stenosis and revascularization with stent assisted angioplasty, middle cerebral artery revascularization of TICI 2C.  He was then admitted to the neuro ICU for further management.  He was hypertensive requiring Cleviprex  drip for short period of time and then transitioned to p.o. medications.  BP goal 120- 140 sys.  He was restarted on on his home blood pressure medications of amlodipine  5 mg, HCTZ 25 mg, and lisinopril  20 mg however this dropped his blood pressure and HCTZ and lisinopril  were discontinued. Asa and Plavix  prior to admission. Now taking Asa 81mg  QD and Brilinta  BID advised for 3 months. PT/OT recommended  outpatient therapy. Personally reviewed hospitalization pertinent progress notes, lab work and imaging. He was discharged home 06/10/2023. Evaluated by Dr Christiane Cowing.   Since discharge, he reports doing well. He continues to have left sided numbness, especially of left occipital area of head and left ear. Also of left lateral leg and foot. He completed 4 sessions of PT/OT. He was discharged and continues HEP.   Asa and Brilinta . He tells me that he ran out of Brilinta  about a week ago. Refills were requested through [facility] and say in process but he has not heard back. He continues pravastatin  and tolerating well.  BP is usually less than 120/80s at home. He continues amlodipine  5g daily. He was seen by PCP office 06/29/2023. BP was stable at that visit.   He reports follow up with Dr Alvira Josephs went well 06/26/2023. He reports not being told how long to continue Brilinta .   Smoking 1-2 cigarettes daily. He is using nicotine  patch but reports not getting refills and using sparingly. He does not exercise but is very active around the home. He enjoys working in the garden.    PERTINENT IMAGING/LABS  Code Stroke  CT head  Age-indeterminate tiny anterior right frontal cortex infarct  CTA head & neck Emergent large vessel occlusion at the right cervical carotid with distal reconstitution at ophthalmic artery level, left ICA old P3 segment occlusion, left ICA bulb 50% stenosis.  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  Right VA origin severe stenosis. CT perfusion 15/231 cc S/p angiogram showed right cavernous ICA occlusion, status  post IR and stenting, achieving TICI2c Post IR CT no hemorrhage MRI  Scattered acute infarcts in the right cerebral hemisphere 2D Echo EF 55 to 60%.   A1C Lab Results  Component Value Date   HGBA1C 5.7 (H) 06/12/2023    Lipid Panel     Component Value Date/Time   CHOL 132 06/12/2023 0641   TRIG 131 06/12/2023 0641   HDL 38 (L) 06/12/2023  0641   CHOLHDL 3.5 06/12/2023 0641   VLDL 26 06/12/2023 0641   LDLCALC 68 06/12/2023 0641      ROS:   14 system review of systems performed and negative with exception of those listed in HPI  PMH:  Past Medical History:  Diagnosis Date   ETOH abuse    History of cerebral artery stenosis    Hyperlipidemia    Hypertension    Stroke [facility])     PSH:  Past Surgical History:  Procedure Laterality Date   COLONOSCOPY WITH PROPOFOL  N/A 01/20/2019   Procedure: COLONOSCOPY WITH PROPOFOL ;  Surgeon: Toledo, Alphonsus Jeans, MD;  Location: [facility] ENDOSCOPY;  Service: Gastroenterology;  Laterality: N/A;   IR ANGIO EXTRACRAN SEL COM CAROTID INNOMINATE UNI BILAT MOD SED  07/23/2017   IR ANGIO INTRA EXTRACRAN SEL COM CAROTID INNOMINATE UNI R MOD SED  11/07/2017   IR ANGIO VERTEBRAL SEL SUBCLAVIAN INNOMINATE UNI R MOD SED  11/07/2017   IR ANGIO VERTEBRAL SEL VERTEBRAL BILAT MOD SED  07/23/2017   IR CT HEAD LTD  06/11/2023   IR CT HEAD LTD  06/11/2023   IR INTRA CRAN STENT  06/11/2023   IR PERCUTANEOUS ART THROMBECTOMY/INFUSION INTRACRANIAL INC DIAG ANGIO  06/11/2023   IR RADIOLOGIST EVAL & MGMT  06/28/2023   MICROLARYNGOSCOPY N/A 06/30/2019   Procedure: SUSPENSION MICROLARYNGOSCOPY WITH MICROFLAP;  Surgeon: Rogers Clayman, MD;  Location: [facility] ORS;  Service: ENT;  Laterality: N/A;   RADIOLOGY WITH ANESTHESIA N/A 11/07/2017   Procedure: STENTING;  Surgeon: Luellen Sages, MD;  Location: [facility] OR;  Service: Radiology;  Laterality: N/A;   RADIOLOGY WITH ANESTHESIA N/A 06/11/2023   Procedure: IR WITH ANESTHESIA;  Surgeon: Radiologist, Medication, MD;  Location: [facility] OR;  Service: Radiology;  Laterality: N/A;   TEE WITHOUT CARDIOVERSION N/A 11/15/2014   Procedure: TRANSESOPHAGEAL ECHOCARDIOGRAM (TEE);  Surgeon: Devorah Fonder, MD;  Location: [facility] ORS;  Service: Cardiovascular;  Laterality: N/A;   TONSILLECTOMY      Social History:  Social History   Socioeconomic History   Marital status: Single    Spouse name:  Not on file   Number of children: Not on file   Years of education: Not on file   Highest education level: Not on file  Occupational History   Not on file  Tobacco Use   Smoking status: Every Day    Current packs/day: 0.50    Types: Cigarettes   Smokeless tobacco: Never   Tobacco comments:    Smokes 1-2 cigarettes per day   Vaping Use   Vaping status: Never Used  Substance and Sexual Activity   Alcohol use: Not Currently   Drug use: Never   Sexual activity: Yes  Other Topics Concern   Not on file  Social History Narrative   Not on file   Social Drivers of Health   Financial Resource Strain: Low Risk  (04/11/2023)   Received from [facility]   Overall Financial Resource Strain (CARDIA)    Difficulty of Paying Living Expenses: Not hard at all  Food Insecurity: Patient Declined (06/11/2023)  Hunger Vital Sign    Worried About Running Out of Food in the Last Year: Patient declined    Ran Out of Food in the Last Year: Patient declined  Transportation Needs: No Transportation Needs (06/11/2023)   PRAPARE - Administrator, Civil Service (Medical): No    Lack of Transportation (Non-Medical): No  Physical Activity: Not on file  Stress: Not on file  Social Connections: Not on file  Intimate Partner Violence: Not At Risk (06/11/2023)   Humiliation, Afraid, Rape, and Kick questionnaire    Fear of Current or Ex-Partner: No    Emotionally Abused: No    Physically Abused: No    Sexually Abused: No    Family History: History reviewed. No pertinent family history.  Medications:   Current Outpatient Medications on File Prior to Visit  Medication Sig Dispense Refill   amLODipine  (NORVASC ) 5 MG tablet Take 5 mg by mouth daily.     aspirin  81 MG chewable tablet Chew 1 tablet (81 mg total) by mouth daily. 120 tablet 0   Multiple Vitamin (MULTIVITAMIN WITH MINERALS) TABS tablet Take 1 tablet by mouth daily. 30 tablet 12   pravastatin  (PRAVACHOL ) 40 MG  tablet Take 1 tablet (40 mg total) by mouth daily. (Patient taking differently: Take 40 mg by mouth every evening.) 30 tablet 1   nicotine  (NICODERM CQ  - DOSED IN MG/24 HOURS) 21 mg/24hr patch Place 1 patch (21 mg total) onto the skin daily. (Patient not taking: Reported on 08/06/2023) 28 patch 0   ondansetron  (ZOFRAN ) 4 MG tablet Take 1 tablet (4 mg total) by mouth every 8 (eight) hours as needed for up to 10 doses for nausea or vomiting. (Patient not taking: Reported on 08/06/2023) 20 tablet 0   ticagrelor  (BRILINTA ) 90 MG TABS tablet Take 1 tablet (90 mg total) by mouth 2 (two) times daily. (Patient not taking: Reported on 08/06/2023) 60 tablet 0   No current facility-administered medications on file prior to visit.    Allergies:  No Known Allergies    OBJECTIVE:  Physical Exam  Vitals:   08/06/23 1240 08/06/23 1246  BP: (!) 175/99 (!) 160/99  Pulse: 85   Weight: 140 lb 8 oz (63.7 kg)   Height: 6' (1.829 m)    Body mass index is 19.06 kg/m. No results found.      No data to display           General: well developed, well nourished, seated, in no evident distress Head: head normocephalic and atraumatic.   Neck: supple with no carotid or supraclavicular bruits Cardiovascular: regular rate, irregular rhythm auscultated on exam, today, no murmurs Musculoskeletal: no deformity Skin:  no rash/petichiae Vascular:  Normal pulses all extremities   Neurologic Exam Mental Status: Awake and fully alert.  Fluent speech and language.  Oriented to place and time. Recent and remote memory intact. Attention span, concentration and fund of knowledge appropriate. Mood and affect appropriate.  Cranial Nerves: Fundoscopic exam reveals sharp disc margins. Pupils equal, briskly reactive to light. Extraocular movements full without nystagmus. Visual fields full to confrontation. Hearing intact. Facial sensation intact. Face, tongue, palate moves normally and symmetrically.  Motor: Normal bulk  and tone. Normal strength in all tested extremity muscles Sensory.: intact to touch , pinprick , position and vibratory sensation.  Coordination: Rapid alternating movements normal in all extremities. Finger-to-nose and heel-to-shin performed accurately bilaterally. Gait and Station: Arises from chair without difficulty. Stance is normal. Gait demonstrates normal stride length and balance  with no assistive device.  Reflexes: 1+ and symmetric.    NIHSS  0 Modified Rankin  0    ASSESSMENT: Henry Boone is a 58 y.o. year old male presenting 06/11/2023 for new onset left sided weakness and numbness. Vascular risk factors include HTN, HLD, tobacco use, history of stroke.     PLAN:  Stroke:  right MCA scattered infarcts with right cavernous ICA occlusion s/p IR and stenting with TICI2c, etiology: Large vessel disease: Residual deficit: left sided numbness. Continue aspirin  81 mg daily and Brilinta  (ticagrelor ) 90 mg bid until 09/08/2023 and pravastatin  40mg  daily for secondary stroke prevention. Clarify with Dr Alvira Josephs if he wants you to take Brilinta  longer. Discussed secondary stroke prevention measures and importance of close PCP follow up for aggressive stroke risk factor management. I have gone over the pathophysiology of stroke, warning signs and symptoms, risk factors and their management in some detail with instructions to go to the closest emergency room for symptoms of concern. HTN: BP goal <130/90. Elevated today. Hx of white coat syndrome. Home readings <120/80. Continue amlodipine  5mg  daily per PCP. May consider increased dose pending home BP readings.  HLD: LDL goal <70. Recent LDL 68. Continue pravastatin  40mg  daily per PCP.  DMII: A1c goal<7.0. Recent A1c 5.7. Prediabetic. Continue to monitor closely per PCP. Low carb diet and regular exercise advised.  Irregular heart beat: irregular rhythm auscultated on exam, today. He reports being told he needed to consider cardiac monitor but  was not ordered. I will order a cardiac monitor. Please let me know if you do not hear back to schedule.  Tobacco use: continue cessation efforts. Wean dose of nicotine  per PCP.    Follow up in 6 months    CC:  [facility] provider: Dr. Janett Medin PCP: Monique Ano, MD    I spent 45 minutes of face-to-face and non-face-to-face time with patient.  This included previsit chart review including review of recent hospitalization, lab review, study review, order entry, electronic health record documentation, patient education regarding recent stroke including etiology, secondary stroke prevention measures and importance of managing stroke risk factors, residual deficits and typical recovery time and answered all other questions to patient satisfaction   Terrilyn Fick, [facility]  [facility] [address] [place], [place] [address]  Phone [phone] Fax [phone] Note: This document was prepared with digital dictation and possible smart phrase technology. Any transcriptional errors that result from this process are unintentional.

## 2023-08-07 ENCOUNTER — Telehealth: Payer: Self-pay | Admitting: Family Medicine

## 2023-08-07 ENCOUNTER — Other Ambulatory Visit: Payer: Self-pay | Admitting: Family Medicine

## 2023-08-07 DIAGNOSIS — I499 Cardiac arrhythmia, unspecified: Secondary | ICD-10-CM

## 2023-08-07 DIAGNOSIS — I493 Ventricular premature depolarization: Secondary | ICD-10-CM

## 2023-08-07 DIAGNOSIS — I63231 Cerebral infarction due to unspecified occlusion or stenosis of right carotid arteries: Secondary | ICD-10-CM

## 2023-08-07 NOTE — Telephone Encounter (Signed)
 Order updated to have new location as requested

## 2023-08-07 NOTE — Telephone Encounter (Signed)
 The location on the cardiac event monitor order needs to be changed to [facility] [facility] thanks

## 2023-08-07 NOTE — Addendum Note (Signed)
 Addended by: Oneita Bihari on: 08/07/2023 11:42 AM   Modules accepted: Orders

## 2023-08-09 ENCOUNTER — Encounter

## 2023-08-09 ENCOUNTER — Ambulatory Visit: Admitting: Physical Therapy

## 2023-08-14 ENCOUNTER — Encounter

## 2023-08-14 ENCOUNTER — Ambulatory Visit: Admitting: Physical Therapy

## 2023-08-16 ENCOUNTER — Encounter

## 2023-08-16 ENCOUNTER — Ambulatory Visit: Admitting: Physical Therapy

## 2023-08-20 ENCOUNTER — Encounter: Admitting: Occupational Therapy

## 2023-08-20 ENCOUNTER — Ambulatory Visit: Admitting: Physical Therapy

## 2023-08-22 ENCOUNTER — Encounter: Admitting: Occupational Therapy

## 2023-08-27 ENCOUNTER — Ambulatory Visit: Admitting: Physical Therapy

## 2023-08-27 ENCOUNTER — Encounter: Admitting: Occupational Therapy

## 2023-08-29 ENCOUNTER — Encounter: Admitting: Occupational Therapy

## 2023-08-29 ENCOUNTER — Ambulatory Visit: Admitting: Physical Therapy

## 2023-09-03 ENCOUNTER — Encounter

## 2023-09-03 ENCOUNTER — Ambulatory Visit: Admitting: Physical Therapy

## 2023-09-05 ENCOUNTER — Ambulatory Visit: Admitting: Physical Therapy

## 2023-09-05 ENCOUNTER — Encounter: Admitting: Occupational Therapy

## 2023-09-10 ENCOUNTER — Encounter

## 2023-09-10 ENCOUNTER — Ambulatory Visit: Admitting: Physical Therapy

## 2023-09-13 ENCOUNTER — Ambulatory Visit: Admitting: Physical Therapy

## 2023-09-13 ENCOUNTER — Encounter

## 2023-09-16 ENCOUNTER — Ambulatory Visit: Attending: Family Medicine

## 2023-09-16 DIAGNOSIS — I63231 Cerebral infarction due to unspecified occlusion or stenosis of right carotid arteries: Secondary | ICD-10-CM

## 2023-09-16 DIAGNOSIS — I493 Ventricular premature depolarization: Secondary | ICD-10-CM

## 2023-09-16 DIAGNOSIS — I499 Cardiac arrhythmia, unspecified: Secondary | ICD-10-CM

## 2023-09-17 ENCOUNTER — Ambulatory Visit: Admitting: Physical Therapy

## 2023-09-17 ENCOUNTER — Encounter

## 2023-09-17 ENCOUNTER — Telehealth: Payer: Self-pay

## 2023-09-17 NOTE — Patient Outreach (Signed)
 First telephone outreach attempt to obtain mRS. No answer. Left message for returned call.  [facility]  Population Health Care Management Assistant  Direct Dial: [phone]  Fax: [phone] Website: [email or web address]

## 2023-09-18 ENCOUNTER — Telehealth: Payer: Self-pay | Admitting: Family Medicine

## 2023-09-18 ENCOUNTER — Ambulatory Visit: Payer: Self-pay | Admitting: Family Medicine

## 2023-09-18 DIAGNOSIS — I499 Cardiac arrhythmia, unspecified: Secondary | ICD-10-CM

## 2023-09-18 DIAGNOSIS — I639 Cerebral infarction, unspecified: Secondary | ICD-10-CM

## 2023-09-18 NOTE — Telephone Encounter (Signed)
 Referral for cardiology sent through [facility] to [facility]. Phone: [phone], Fax: [phone]

## 2023-09-20 ENCOUNTER — Encounter

## 2023-09-20 ENCOUNTER — Ambulatory Visit: Admitting: Physical Therapy

## 2023-09-24 ENCOUNTER — Encounter

## 2023-09-24 ENCOUNTER — Ambulatory Visit: Admitting: Physical Therapy

## 2023-09-24 ENCOUNTER — Telehealth: Payer: Self-pay

## 2023-09-24 NOTE — Telephone Encounter (Signed)
Please see mychart message from today.

## 2023-09-24 NOTE — Patient Outreach (Signed)
 Second telephone outreach attempt to obtain mRS. No answer. Left message for returned call.  [facility]  Population Health Care Management Assistant  Direct Dial: [phone]  Fax: [phone] Website: [email or web address]

## 2023-09-26 ENCOUNTER — Encounter: Admitting: Occupational Therapy

## 2023-09-26 ENCOUNTER — Ambulatory Visit: Admitting: Physical Therapy

## 2023-09-26 ENCOUNTER — Telehealth: Payer: Self-pay

## 2023-09-26 NOTE — Patient Outreach (Signed)
 3 outreach attempts were completed to obtain mRs. mRs could not be obtained because patient never returned my calls. mRs=7    Henry Boone [facility] Management Assistant  Direct Dial: [phone]  Fax: [phone] Website: [email or web address]

## 2023-09-30 NOTE — Telephone Encounter (Signed)
 LVM for pt to call office

## 2023-10-01 ENCOUNTER — Encounter

## 2023-10-01 ENCOUNTER — Ambulatory Visit: Admitting: Physical Therapy

## 2023-10-01 NOTE — Telephone Encounter (Addendum)
 Called pt at [phone]. LVM for pt to call.   Called wife on [place] at [phone]. LVM for her to call office.

## 2023-10-04 ENCOUNTER — Emergency Department
Admission: EM | Admit: 2023-10-04 | Discharge: 2023-10-04 | Attending: Emergency Medicine | Admitting: Emergency Medicine

## 2023-10-04 ENCOUNTER — Inpatient Hospital Stay (HOSPITAL_COMMUNITY)

## 2023-10-04 ENCOUNTER — Other Ambulatory Visit: Payer: Self-pay

## 2023-10-04 ENCOUNTER — Other Ambulatory Visit: Payer: Self-pay | Admitting: Internal Medicine

## 2023-10-04 ENCOUNTER — Observation Stay (HOSPITAL_COMMUNITY)
Admission: EM | Admit: 2023-10-04 | Discharge: 2023-10-05 | Disposition: A | Discharge status: Home / Self Care | Source: Other Acute Inpatient Hospital | Attending: Internal Medicine | Admitting: Internal Medicine

## 2023-10-04 ENCOUNTER — Ambulatory Visit: Admitting: Physical Therapy

## 2023-10-04 ENCOUNTER — Encounter

## 2023-10-04 ENCOUNTER — Encounter (HOSPITAL_COMMUNITY): Payer: Self-pay

## 2023-10-04 ENCOUNTER — Emergency Department

## 2023-10-04 DIAGNOSIS — H538 Other visual disturbances: Secondary | ICD-10-CM | POA: Insufficient documentation

## 2023-10-04 DIAGNOSIS — H547 Unspecified visual loss: Secondary | ICD-10-CM | POA: Diagnosis not present

## 2023-10-04 DIAGNOSIS — Z79899 Other long term (current) drug therapy: Secondary | ICD-10-CM | POA: Insufficient documentation

## 2023-10-04 DIAGNOSIS — E785 Hyperlipidemia, unspecified: Secondary | ICD-10-CM | POA: Insufficient documentation

## 2023-10-04 DIAGNOSIS — Z7982 Long term (current) use of aspirin: Secondary | ICD-10-CM | POA: Insufficient documentation

## 2023-10-04 DIAGNOSIS — F17210 Nicotine dependence, cigarettes, uncomplicated: Secondary | ICD-10-CM | POA: Insufficient documentation

## 2023-10-04 DIAGNOSIS — G459 Transient cerebral ischemic attack, unspecified: Secondary | ICD-10-CM

## 2023-10-04 DIAGNOSIS — Z743 Need for continuous supervision: Secondary | ICD-10-CM | POA: Diagnosis not present

## 2023-10-04 DIAGNOSIS — I639 Cerebral infarction, unspecified: Secondary | ICD-10-CM | POA: Diagnosis present

## 2023-10-04 DIAGNOSIS — I6521 Occlusion and stenosis of right carotid artery: Secondary | ICD-10-CM | POA: Insufficient documentation

## 2023-10-04 DIAGNOSIS — I10 Essential (primary) hypertension: Secondary | ICD-10-CM | POA: Insufficient documentation

## 2023-10-04 DIAGNOSIS — R29818 Other symptoms and signs involving the nervous system: Secondary | ICD-10-CM | POA: Diagnosis not present

## 2023-10-04 DIAGNOSIS — Z8673 Personal history of transient ischemic attack (TIA), and cerebral infarction without residual deficits: Secondary | ICD-10-CM | POA: Insufficient documentation

## 2023-10-04 LAB — DIFFERENTIAL
Abs Immature Granulocytes: 0.03 10*3/uL (ref 0.00–0.07)
Basophils Absolute: 0.1 10*3/uL (ref 0.0–0.1)
Basophils Relative: 1 %
Eosinophils Absolute: 0.1 10*3/uL (ref 0.0–0.5)
Eosinophils Relative: 1 %
Immature Granulocytes: 0 %
Lymphocytes Relative: 19 %
Lymphs Abs: 1.9 10*3/uL (ref 0.7–4.0)
Monocytes Absolute: 0.9 10*3/uL (ref 0.1–1.0)
Monocytes Relative: 9 %
Neutro Abs: 7.1 10*3/uL (ref 1.7–7.7)
Neutrophils Relative %: 70 %

## 2023-10-04 LAB — URINE DRUG SCREEN, QUALITATIVE (ARMC ONLY)
Amphetamines, Ur Screen: NOT DETECTED
Barbiturates, Ur Screen: NOT DETECTED
Benzodiazepine, Ur Scrn: NOT DETECTED
Cannabinoid 50 Ng, Ur ~~LOC~~: NOT DETECTED
Cocaine Metabolite,Ur ~~LOC~~: NOT DETECTED
MDMA (Ecstasy)Ur Screen: NOT DETECTED
Methadone Scn, Ur: NOT DETECTED
Opiate, Ur Screen: NOT DETECTED
Phencyclidine (PCP) Ur S: NOT DETECTED
Tricyclic, Ur Screen: NOT DETECTED

## 2023-10-04 LAB — COMPREHENSIVE METABOLIC PANEL WITH GFR
ALT: 13 U/L (ref 0–44)
AST: 16 U/L (ref 15–41)
Albumin: 4 g/dL (ref 3.5–5.0)
Alkaline Phosphatase: 66 U/L (ref 38–126)
Anion gap: 10 (ref 5–15)
BUN: 11 mg/dL (ref 6–20)
CO2: 24 mmol/L (ref 22–32)
Calcium: 9.3 mg/dL (ref 8.9–10.3)
Chloride: 103 mmol/L (ref 98–111)
Creatinine, Ser: 0.68 mg/dL (ref 0.61–1.24)
GFR, Estimated: >60 mL/min (ref >60)
Glucose, Bld: 139 mg/dL — ABNORMAL HIGH (ref 70–99)
Potassium: 4 mmol/L (ref 3.5–5.1)
Sodium: 137 mmol/L (ref 135–145)
Total Bilirubin: 0.6 mg/dL (ref 0.0–1.2)
Total Protein: 6.7 g/dL (ref 6.5–8.1)

## 2023-10-04 LAB — CBC
HCT: 44.6 % (ref 39.0–52.0)
Hemoglobin: 14.8 g/dL (ref 13.0–17.0)
MCH: 29.5 pg (ref 26.0–34.0)
MCHC: 33.2 g/dL (ref 30.0–36.0)
MCV: 88.8 fL (ref 80.0–100.0)
Platelets: 382 10*3/uL (ref 150–400)
RBC: 5.02 MIL/uL (ref 4.22–5.81)
RDW: 12.8 % (ref 11.5–15.5)
WBC: 10.1 10*3/uL (ref 4.0–10.5)
nRBC: 0 % (ref 0.0–0.2)

## 2023-10-04 LAB — PROTIME-INR
INR: 1 (ref 0.8–1.2)
Prothrombin Time: 12.9 s (ref 11.4–15.2)

## 2023-10-04 LAB — CBG MONITORING, ED: Glucose-Capillary: 132 mg/dL — ABNORMAL HIGH (ref 70–99)

## 2023-10-04 LAB — APTT: aPTT: 29 s (ref 24–36)

## 2023-10-04 MED ORDER — IOHEXOL 350 MG/ML SOLN
75.0000 mL | Freq: Once | INTRAVENOUS | Status: AC | PRN
  Administered 2023-10-04: 75 mL via INTRAVENOUS

## 2023-10-04 MED ORDER — SENNOSIDES-DOCUSATE SODIUM 8.6-50 MG PO TABS: 1.0000 | ORAL_TABLET | Freq: Every evening | ORAL | Status: DC | PRN

## 2023-10-04 MED ORDER — PRAVASTATIN SODIUM 40 MG PO TABS
40.0000 mg | ORAL_TABLET | Freq: Every evening | ORAL | Status: DC
  Administered 2023-10-04: 40 mg via ORAL
  Filled 2023-10-04: qty 1

## 2023-10-04 MED ORDER — TICAGRELOR 90 MG PO TABS
90.0000 mg | ORAL_TABLET | Freq: Two times a day (BID) | ORAL | Status: DC
  Administered 2023-10-04 – 2023-10-05 (×2): 90 mg via ORAL
  Filled 2023-10-04 (×2): qty 1

## 2023-10-04 MED ORDER — TICAGRELOR 90 MG PO TABS
90.0000 mg | ORAL_TABLET | Freq: Two times a day (BID) | ORAL | Status: DC
  Administered 2023-10-04: 90 mg via ORAL
  Filled 2023-10-04: qty 1

## 2023-10-04 MED ORDER — ACETAMINOPHEN 650 MG RE SUPP: 650.0000 mg | RECTAL | Status: DC | PRN

## 2023-10-04 MED ORDER — ACETAMINOPHEN 160 MG/5ML PO SOLN: 650.0000 mg | ORAL | Status: DC | PRN

## 2023-10-04 MED ORDER — ASPIRIN 81 MG PO CHEW
81.0000 mg | CHEWABLE_TABLET | Freq: Every day | ORAL | Status: DC
  Administered 2023-10-05: 81 mg via ORAL
  Filled 2023-10-04: qty 1

## 2023-10-04 MED ORDER — STROKE: EARLY STAGES OF RECOVERY BOOK
Freq: Once | Status: AC
  Filled 2023-10-04: qty 1

## 2023-10-04 MED ORDER — ACETAMINOPHEN 325 MG PO TABS: 650.0000 mg | ORAL_TABLET | ORAL | Status: DC | PRN

## 2023-10-04 NOTE — ED Triage Notes (Signed)
 Pt arrived via [facility] from [facility] ED for a left carotid occlusion

## 2023-10-04 NOTE — ED Notes (Signed)
Neurologist at bedside speaking with pt.  

## 2023-10-04 NOTE — H&P (Addendum)
 History and Physical   Henry Boone ZOX:[id number] DOB: [birth year] DOA: 10/04/2023  PCP: Monique Ano, MD   Patient coming from: Home  Chief Complaint: Blurry vision  HPI: Henry Boone is a 58 y.o. male with medical history significant of hypertension, hyperlipidemia, CVA, carotid artery disease presenting with vision changes.  Patient reports that he was changing litter boxes earlier today and had sudden blurry vision of his right eye.  The vision briefly worsened to the point where he was blind in that eye but it then improved to blurry again.  Has had gradual improvement since.  Now back to normal.  Denies fevers, chills, chest pain, shortness of breath, abdominal pain, constipation, diarrhea, nausea, vomiting.  He reports it was unclear how long he was to take his DAPT. He initially had Rx for three months, Neuro follow recommended refill and to clarify how long he was to be taking this. PCP refilled for 4th month which ran out 2 weeks ago and it was never fully clarified per patient if he was to remain on the medication longer.  ED Course: Vital signs in the ED notable for blood pressure 157 180 systolic.  Lab workup included CMP with glucose 135.  CBC within normal limits.  PT and INR normal.  UDS pending.  CT head showed positive new left ICA occlusion and cavernous favoring new LVO with no other LVO was noted.  Reconstitution distally.  Symptoms have improved in the ED send no intervention at this time.  Transferred from [facility] to [facility] in case intervention is needed for recurrent/worsening symptoms.  Neurology following.  Review of Systems: As per HPI otherwise all other systems reviewed and are negative.  Past Medical History:  Diagnosis Date   ETOH abuse    History of cerebral artery stenosis    Hyperlipidemia    Hypertension    Stroke [facility])     Past Surgical History:  Procedure Laterality Date   COLONOSCOPY WITH PROPOFOL  N/A 01/20/2019   Procedure: COLONOSCOPY  WITH PROPOFOL ;  Surgeon: Toledo, Alphonsus Jeans, MD;  Location: [facility] ENDOSCOPY;  Service: Gastroenterology;  Laterality: N/A;   IR ANGIO EXTRACRAN SEL COM CAROTID INNOMINATE UNI BILAT MOD SED  07/23/2017   IR ANGIO INTRA EXTRACRAN SEL COM CAROTID INNOMINATE UNI R MOD SED  11/07/2017   IR ANGIO VERTEBRAL SEL SUBCLAVIAN INNOMINATE UNI R MOD SED  11/07/2017   IR ANGIO VERTEBRAL SEL VERTEBRAL BILAT MOD SED  07/23/2017   IR CT HEAD LTD  06/11/2023   IR CT HEAD LTD  06/11/2023   IR INTRA CRAN STENT  06/11/2023   IR PERCUTANEOUS ART THROMBECTOMY/INFUSION INTRACRANIAL INC DIAG ANGIO  06/11/2023   IR RADIOLOGIST EVAL & MGMT  06/28/2023   MICROLARYNGOSCOPY N/A 06/30/2019   Procedure: SUSPENSION MICROLARYNGOSCOPY WITH MICROFLAP;  Surgeon: Rogers Clayman, MD;  Location: [facility] ORS;  Service: ENT;  Laterality: N/A;   RADIOLOGY WITH ANESTHESIA N/A 11/07/2017   Procedure: STENTING;  Surgeon: Luellen Sages, MD;  Location: [facility] OR;  Service: Radiology;  Laterality: N/A;   RADIOLOGY WITH ANESTHESIA N/A 06/11/2023   Procedure: IR WITH ANESTHESIA;  Surgeon: Radiologist, Medication, MD;  Location: [facility] OR;  Service: Radiology;  Laterality: N/A;   TEE WITHOUT CARDIOVERSION N/A 11/15/2014   Procedure: TRANSESOPHAGEAL ECHOCARDIOGRAM (TEE);  Surgeon: Devorah Fonder, MD;  Location: [facility] ORS;  Service: Cardiovascular;  Laterality: N/A;   TONSILLECTOMY      Social History  reports that he has been smoking cigarettes. He has never used smokeless tobacco.  He reports that he does not currently use alcohol. He reports that he does not use drugs.  No Known Allergies  No family history on file.  Prior to Admission medications   Medication Sig Start Date End Date Taking? Authorizing Provider  amLODipine  (NORVASC ) 5 MG tablet Take 5 mg by mouth daily.    [provider]  aspirin  81 MG chewable tablet Chew 1 tablet (81 mg total) by mouth daily. 06/14/23   Laymond Priestly, NP  Multiple Vitamin (MULTIVITAMIN WITH MINERALS) TABS  tablet Take 1 tablet by mouth daily. 11/15/14   Valdene Garret, MD  nicotine  (NICODERM CQ  - DOSED IN MG/24 HOURS) 21 mg/24hr patch Place 1 patch (21 mg total) onto the skin daily. Patient not taking: Reported on 08/06/2023 06/14/23   Laymond Priestly, NP  ondansetron  (ZOFRAN ) 4 MG tablet Take 1 tablet (4 mg total) by mouth every 8 (eight) hours as needed for up to 10 doses for nausea or vomiting. Patient not taking: Reported on 08/06/2023 06/30/19   Rogers Clayman, MD  pravastatin  (PRAVACHOL ) 40 MG tablet Take 1 tablet (40 mg total) by mouth daily. Patient taking differently: Take 40 mg by mouth every evening. 11/15/14   Valdene Garret, MD  ticagrelor  (BRILINTA ) 90 MG TABS tablet Take 1 tablet (90 mg total) by mouth 2 (two) times daily. Patient not taking: Reported on 08/06/2023 06/13/23   Laymond Priestly, NP    Physical Exam: Vitals:   10/04/23 1731 10/04/23 1732 10/04/23 1745 10/04/23 1808  BP:  (!) 172/79 (!) 160/79   Pulse:  71 77   Resp:  17 16   Temp:  98.3 F (36.8 C)    TempSrc:      SpO2: 100% 100% 100%   Weight:    66.7 kg    Physical Exam Constitutional:      General: He is not in acute distress.    Appearance: Normal appearance.  HENT:     Head: Normocephalic and atraumatic.     Mouth/Throat:     Mouth: Mucous membranes are moist.     Pharynx: Oropharynx is clear.   Eyes:     Extraocular Movements: Extraocular movements intact.     Pupils: Pupils are equal, round, and reactive to light.    Cardiovascular:     Rate and Rhythm: Normal rate and regular rhythm.     Pulses: Normal pulses.     Heart sounds: Normal heart sounds.  Pulmonary:     Effort: Pulmonary effort is normal. No respiratory distress.     Breath sounds: Normal breath sounds.  Abdominal:     General: Bowel sounds are normal. There is no distension.     Palpations: Abdomen is soft.     Tenderness: There is no abdominal tenderness.   Musculoskeletal:        General: No swelling or deformity.    Skin:    General: Skin is warm and dry.   Neurological:     Comments: Mental Status: Patient is awake, alert, oriented x3 No signs of aphasia or neglect Cranial Nerves: II: Pupils equal, round, and reactive to light.   III,IV, VI: EOMI without ptosis or diploplia.  V: Facial sensation is symmetric to light touch. VII: Facial movement is symmetric.  VIII: hearing is intact to voice X: Uvula elevates symmetrically XI: Shoulder shrug is symmetric. XII: tongue is midline without atrophy or fasciculations.  Motor: Good effort thorughout, at Least 5/5 bilateral UE, 5/5 bilateral lower extremitiy  Sensory: Sensation is grossly intact bilateral UEs & LEs Cerebellar: Finger-Nose intact bilat    Labs on Admission: I have personally reviewed following labs and imaging studies  CBC: Recent Labs  Lab 10/04/23 0923  WBC 10.1  NEUTROABS 7.1  HGB 14.8  HCT 44.6  MCV 88.8  PLT 382    Basic Metabolic Panel: Recent Labs  Lab 10/04/23 0923  NA 137  K 4.0  CL 103  CO2 24  GLUCOSE 139*  BUN 11  CREATININE 0.68  CALCIUM 9.3    GFR: Estimated Creatinine Clearance: 98.4 mL/min (by C-G formula based on SCr of 0.68 mg/dL).  Liver Function Tests: Recent Labs  Lab 10/04/23 0923  AST 16  ALT 13  ALKPHOS 66  BILITOT 0.6  PROT 6.7  ALBUMIN  4.0    Urine analysis: No results found for: COLORURINE, APPEARANCEUR, LABSPEC, PHURINE, GLUCOSEU, HGBUR, BILIRUBINUR, KETONESUR, PROTEINUR, UROBILINOGEN, NITRITE, LEUKOCYTESUR  Radiological Exams on Admission: CT ANGIO HEAD NECK W WO CM Result Date: 10/04/2023 CLINICAL DATA:  58 year old male with TIA, sudden onset right eye vision changes. Status post emergent right ICA occlusion in February, endovascular revascularization. EXAM: CT ANGIOGRAPHY HEAD AND NECK WITH AND WITHOUT CONTRAST TECHNIQUE: Multidetector CT imaging of the head and neck was performed using the standard protocol during bolus administration of  intravenous contrast. Multiplanar CT image reconstructions and MIPs were obtained to evaluate the vascular anatomy. Carotid stenosis measurements (when applicable) are obtained utilizing NASCET criteria, using the distal internal carotid diameter as the denominator. RADIATION DOSE REDUCTION: This exam was performed according to the departmental dose-optimization program which includes automated exposure control, adjustment of the mA and/or kV according to patient size and/or use of iterative reconstruction technique. CONTRAST:  75mL OMNIPAQUE  IOHEXOL  350 MG/ML SOLN COMPARISON:  Head CT and prior CTA 06/11/2023. FINDINGS: CT HEAD Brain: No midline shift, ventriculomegaly, mass effect, evidence of mass lesion, intracranial hemorrhage or evidence of cortically based acute infarction. Stable gray-white matter differentiation throughout the brain. Chronic posterior right MCA territory encephalomalacia appears stable since February. Calvarium and skull base: Stable and intact. Paranasal sinuses: Visualized paranasal sinuses and mastoids are stable and well aerated. Orbits: Visualized orbits and scalp soft tissues are within normal limits. CTA NECK Skeleton: No acute osseous abnormality identified. Upper chest: Chronic fairly advanced emphysema in the upper lungs. Abnormal posterior left upper lobe spiculated and nodular opacity, present on 06/11/2023 chest CT and appears stable to slightly larger (please see that report) No superimposed superior mediastinal lymphadenopathy. Other neck: Nonvascular neck soft tissue spaces appear within normal limits. Aortic arch: Calcified aortic atherosclerosis.  3 vessel arch. Right carotid system: Brachiocephalic artery and right carotid atherosclerosis. No significant stenosis to the skull base. Left carotid system: Left CCA soft and calcified plaque including low-density plaque in the mid to distal left CCA. Soft and calcified plaque at the left ICA origin and bulb with less than 50 %  stenosis with respect to the distal vessel. More advanced plaque and stenosis just below the skull base, estimated at 60%. See intracranial findings below. Vertebral arteries: Proximal subclavian artery atherosclerosis is stable. Left vertebral artery arises early with atherosclerosis at its origin, stable. Both vertebral arteries remain patent to the skull base, fairly codominant. CTA HEAD Posterior circulation: Diminutive vertebrobasilar system proximally due to persistent left side trigeminal artery. Diminutive distal vertebral arteries, PICA origins, diminutive proximal basilar artery appear stable and patent. Persistent trigeminal artery, see PCA details below. Anterior circulation: Right ICA siphon has been revascularized since 06/11/2023 but with  severe left siphon calcified atherosclerosis and superimposed patent vascular stent. Right ICA terminus is patent. Left ICA siphon also highly atherosclerotic, and is newly occluded in the cavernous segment on series 14, image 135. A persistent left side trigeminal artery remains patent just proximal to the occlusion, with calcified plaque at its origin. No reconstitution of the supraclinoid left ICA. The left MCA and ACA origins are being reconstituted likely in part from the anterior communicating artery. Distal basilar artery supplied via persistent trigeminal, and contralateral fetal type right PCA origin. Bilateral PCA branches are within normal limits. Left MCA M1 segment and bifurcation remain patent without stenosis. Anterior communicating artery, bilateral ACA branches are stable and within normal limits. Right MCA M1 segment and bifurcation are patent without stenosis. Bilateral MCA branches are stable. No branch occlusion. Venous sinuses: Early contrast timing, grossly patent. Anatomic variants: Persistent left side trigeminal artery which supplies the distal basilar. Superimposed fetal type right PCA origin. Review of the MIP images confirms the above  findings IMPRESSION: 1. 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 This was discussed by telephone with Dr. Bryson Carbine on 10/04/2023 at 10:46 . 2. No other intracranial large vessel or circle-of-Willis branch occlusion identified. Patent distal Right ICA stent. Advanced atherosclerosis throughout the head and neck. * No acute intracranial abnormality by plain CT. Chronic posterior right MCA encephalomalacia. *Emphysema (ICD10-J43.9), with abnormal left upper lung masslike opacity. PET-CT was recommended in February. * Aortic Atherosclerosis (ICD10-I70.0). Electronically Signed   By: Marlise Simpers M.D.   On: 10/04/2023 10:56   EKG: Independently reviewed.  Sinus rhythm at 87 beats minute.  Evidence of early LVH.  QTc prolonged at 505.  Assessment/Plan Principal Problem:   Focal neurological deficit Active Problems:   Carotid stenosis, right   History of ischemic multifocal multiple vascular territories stroke   Essential hypertension   Hyperlipidemia   Focal neurologic deficit TIA versus CVA History of CVA Carotid artery disease > Patient presenting with blurry vision that was sudden onset in his right eye.  Had brief total blindness but this improved back to blurry vision and has continued to gradually improve in the ED, now normal. > Imaging in the ED included CT head which showed no acute infarct but did show evidence of new left ICA occlusion and cavernous segment favoring LVO but no other obvious.  Reconstitution distally with improvement in symptoms.  No intervention at this time. > Patient has history of right ACA occlusion that was stented in February.  Was to be on longer-term Brilinta  but initially there was some mention of 3 months, however it does not appear this was ever clarified that he need to be on it  longer term and he ended up taking 4 months worth and ran out 2 weeks ago.  > Neurology consulted at [facility] and recommend transfer to [facility] in case neurointervention is needed.  If any change in mental status will need repeat CT head and discussion with neurology for possible coordination for intervention. - Monitor on progressive unit for close monitoring overnight - Neurology notified of patient arrival, appreciate recommendations and assistance - Allow for permissive HTN (systolic < 220 and diastolic < 120) - Continue home aspirin  - Restart home Brilinta  - Continue home pravastatin  - Recent echocardiogram from a few months ago, we will hold off on repeat for  now - MR brain - A1C  - Lipid panel  -Tele monitoring  - SLP eval - PT/OT - Repeat CT head for any change neuro exam as well as reactivation of code stroke and consideration of intervention. - As per neurology note if intervention not done inpatient will need follow-up with neurointerventional radiology for outpatient arteriogram. - Ensure clear plan for follow-up and duration of Brilinta  on discharge.  Hypertension - Permissive hypertension as above  Hyperlipidemia - Continue pravastatin   DVT prophylaxis: Lovenox   Code Status:   Full  Family Communication:  None  Disposition Plan:   Patient is from:  Home  Anticipated DC to:  Home  Anticipated DC date:  2 to 3 days  Anticipated DC barriers: None  Consults called:  Neurology Admission status:  Inpatient, progressive  Severity of Illness: The appropriate patient status for this patient is INPATIENT. Inpatient status is judged to be reasonable and necessary in order to provide the required intensity of service to ensure the patient's safety. The patient's presenting symptoms, physical exam findings, and initial radiographic and laboratory data in the context of their chronic comorbidities is felt to place them at high risk for further clinical deterioration. Furthermore,  it is not anticipated that the patient will be medically stable for discharge from the hospital within 2 midnights of admission.   * I certify that at the point of admission it is my clinical judgment that the patient will require inpatient hospital care spanning beyond 2 midnights from the point of admission due to high intensity of service, high risk for further deterioration and high frequency of surveillance required.Johnetta Nab MD [facility]  How to contact the [facility] Attending or Consulting provider 7A - 7P or covering provider during after hours 7P -7A, for this patient?   Check the care team in [facility] and look for a) attending/consulting [facility] provider listed and b) the [facility] team listed Log into [email or web address] and use [place]'s universal password to access. If you do not have the password, please contact the hospital operator. Locate the [facility] provider you are looking for under [facility] and page to a number that you can be directly reached. If you still have difficulty reaching the provider, please page the [facility] (Director on Call) for the Hospitalists listed on amion for assistance.  10/04/2023, 6:14 PM

## 2023-10-04 NOTE — ED Notes (Signed)
 Pt states to [employer] that he is at about 80% with how he is feeling.

## 2023-10-04 NOTE — Consult Note (Signed)
 NEUROLOGY CONSULT NOTE   Date of service: October 04, 2023 Patient Name: Henry Boone MRN:  [id number] DOB:  1965-10-04 Chief Complaint: stroke code Requesting Provider: Bryson Carbine, MD  History of Present Illness  Henry Boone is a 58 y.o. male with hx of HTN, HL, stroke in Feb 2025 2/2 acute R ICA occlusion that was revascularized and stented.  He has no residual deficits from his prior stroke.  Stroke code was called based on CTA imaging results.  Patient presented to ED triage and reported that at 9:00 AM when he was bending over changing litter boxes he had sudden onset of blurred vision to the right eye.  It is now completely resolved.  He had no other focal deficits at that time such as weakness, numbness, speech difficulty, or impairment of ambulation.  He is currently asymptomatic.  NIH stroke scale of my exam is 0.  Just prior to stroke code being called patient underwent CT head and CTA H&N that was reported as follows:  1. 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 This was discussed by telephone with Dr. Bryson Carbine on 10/04/2023 at 10:46 .   2. No other intracranial large vessel or circle-of-Willis branch occlusion identified. Patent distal Right ICA stent. Advanced atherosclerosis throughout the head and neck.   * No acute intracranial abnormality by plain CT. Chronic posterior right MCA encephalomalacia.   *Emphysema (ICD10-J43.9), with abnormal left upper lung masslike opacity. PET-CT was recommended in February.   * Aortic Atherosclerosis (ICD10-I70.0).  Stroke code was activated due to concern for possible LVO.  LKW: 0900, sx have resolved Modified rankin score: 1-No significant post stroke disability and can perform usual duties with stroke symptoms IV Thrombolysis: no,  patient is back to baseline EVT: no, patient is back to baseline; risk of intervention would outweigh the benefit.  NIHSS = 0, patient is back to baseline   ROS   Comprehensive ROS performed and pertinent positives documented in HPI   Past History   Past Medical History:  Diagnosis Date   ETOH abuse    History of cerebral artery stenosis    Hyperlipidemia    Hypertension    Stroke [facility])     Past Surgical History:  Procedure Laterality Date   COLONOSCOPY WITH PROPOFOL  N/A 01/20/2019   Procedure: COLONOSCOPY WITH PROPOFOL ;  Surgeon: Toledo, Alphonsus Jeans, MD;  Location: [facility] ENDOSCOPY;  Service: Gastroenterology;  Laterality: N/A;   IR ANGIO EXTRACRAN SEL COM CAROTID INNOMINATE UNI BILAT MOD SED  07/23/2017   IR ANGIO INTRA EXTRACRAN SEL COM CAROTID INNOMINATE UNI R MOD SED  11/07/2017   IR ANGIO VERTEBRAL SEL SUBCLAVIAN INNOMINATE UNI R MOD SED  11/07/2017   IR ANGIO VERTEBRAL SEL VERTEBRAL BILAT MOD SED  07/23/2017   IR CT HEAD LTD  06/11/2023   IR CT HEAD LTD  06/11/2023   IR INTRA CRAN STENT  06/11/2023   IR PERCUTANEOUS ART THROMBECTOMY/INFUSION INTRACRANIAL INC DIAG ANGIO  06/11/2023   IR RADIOLOGIST EVAL & MGMT  06/28/2023   MICROLARYNGOSCOPY N/A 06/30/2019   Procedure: SUSPENSION MICROLARYNGOSCOPY WITH MICROFLAP;  Surgeon: Rogers Clayman, MD;  Location: [facility] ORS;  Service: ENT;  Laterality: N/A;   RADIOLOGY WITH ANESTHESIA N/A 11/07/2017   Procedure: STENTING;  Surgeon: Luellen Sages, MD;  Location: [facility] OR;  Service: Radiology;  Laterality: N/A;  RADIOLOGY WITH ANESTHESIA N/A 06/11/2023   Procedure: IR WITH ANESTHESIA;  Surgeon: Radiologist, Medication, MD;  Location: [facility] OR;  Service: Radiology;  Laterality: N/A;   TEE WITHOUT CARDIOVERSION N/A 11/15/2014   Procedure: TRANSESOPHAGEAL ECHOCARDIOGRAM (TEE);  Surgeon: Devorah Fonder, MD;  Location: [facility] ORS;  Service: Cardiovascular;  Laterality: N/A;   TONSILLECTOMY      Family History: History reviewed. No pertinent family  history.  Social History  reports that he has been smoking cigarettes. He has never used smokeless tobacco. He reports that he does not currently use alcohol. He reports that he does not use drugs.  No Known Allergies  Medications   Current Facility-Administered Medications:    ticagrelor  (BRILINTA ) tablet 90 mg, 90 mg, Oral, BID, Kinner, Robert, MD, 90 mg at 10/04/23 1118  Current Outpatient Medications:    amLODipine  (NORVASC ) 5 MG tablet, Take 5 mg by mouth daily., Disp: , Rfl:    aspirin  81 MG chewable tablet, Chew 1 tablet (81 mg total) by mouth daily., Disp: 120 tablet, Rfl: 0   Multiple Vitamin (MULTIVITAMIN WITH MINERALS) TABS tablet, Take 1 tablet by mouth daily., Disp: 30 tablet, Rfl: 12   nicotine  (NICODERM CQ  - DOSED IN MG/24 HOURS) 21 mg/24hr patch, Place 1 patch (21 mg total) onto the skin daily. (Patient not taking: Reported on 08/06/2023), Disp: 28 patch, Rfl: 0   ondansetron  (ZOFRAN ) 4 MG tablet, Take 1 tablet (4 mg total) by mouth every 8 (eight) hours as needed for up to 10 doses for nausea or vomiting. (Patient not taking: Reported on 08/06/2023), Disp: 20 tablet, Rfl: 0   pravastatin  (PRAVACHOL ) 40 MG tablet, Take 1 tablet (40 mg total) by mouth daily. (Patient taking differently: Take 40 mg by mouth every evening.), Disp: 30 tablet, Rfl: 1   ticagrelor  (BRILINTA ) 90 MG TABS tablet, Take 1 tablet (90 mg total) by mouth 2 (two) times daily. (Patient not taking: Reported on 08/06/2023), Disp: 60 tablet, Rfl: 0  Vitals   Vitals:   10/04/23 1000 10/04/23 1015 10/04/23 1030 10/04/23 1045  BP: (!) 160/80  (!) 160/96   Pulse: (!) 43 76 77 71  Resp: (!) 21 17 18 19   Temp:      TempSrc:      SpO2: 97% 100% 100% 99%  Weight:      Height:        Body mass index is 19.94 kg/m.   Physical Exam    Physical Exam Gen: A&Ox4, NAD HEENT: Atraumatic, normocephalic; oropharynx clear, tongue without atrophy or fasciculations. Resp: CTAB, normal work of breathing CV: RRR,  extremities appear well-perfused. Abd: soft/NT/ND Extrem: Nml bulk; no cyanosis, clubbing, or edema.  Neuro: *MS: A&O x4. Follows multi-step commands.  *Speech: no dysarthria or aphasia, able to name and repeat. *CN:    I: Deferred   II,III: PERRLA, VFF by confrontation, optic discs not visualized 2/2 pupillary constriction   III,IV,VI: EOMI w/o nystagmus, no ptosis   V: Sensation intact from V1 to V3 to LT   VII: Eyelid closure was full.  Smile symmetric.   VIII: Hearing intact to voice   IX,X: Voice normal, palate elevates symmetrically    XI: SCM/trap 5/5 bilat   XII: Tongue protrudes midline, no atrophy or fasciculations  *Motor:   Normal bulk.  No tremor, rigidity or bradykinesia. No pronator drift.   Strength: Dlt Bic Tri WE WrF FgS Gr HF KnF KnE PlF DoF    Left 5 5 5 5 5 5 5 5  5  5 5 5     Right 5 5 5 5 5 5 5 5 5 5 5 5    *Sensory: Intact to light touch, pinprick, temperature vibration throughout. Symmetric. Propioception intact bilat.  No double-simultaneous extinction.  *Coordination:  Finger-to-nose, heel-to-shin, rapid alternating motions were intact. *Reflexes:  2+ and symmetric throughout without clonus; toes down-going bilat *Gait: deferred   Labs/Imaging/Neurodiagnostic studies   CBC:  Recent Labs  Lab 2023/10/31 0923  WBC 10.1  NEUTROABS 7.1  HGB 14.8  HCT 44.6  MCV 88.8  PLT 382   Basic Metabolic Panel:  Lab Results  Component Value Date   NA 137 October 31, 2023   K 4.0 Oct 31, 2023   CO2 24 10-31-23   GLUCOSE 139 (H) Oct 31, 2023   BUN 11 10-31-23   CREATININE 0.68 Oct 31, 2023   CALCIUM 9.3 2023/10/31   GFRNONAA >60 2023/10/31   GFRAA >60 06/25/2019   Lipid Panel:  Lab Results  Component Value Date   LDLCALC 68 06/12/2023   HgbA1c:  Lab Results  Component Value Date   HGBA1C 5.7 (H) 06/12/2023   Urine Drug Screen: No results found for: LABOPIA, COCAINSCRNUR, LABBENZ, AMPHETMU, THCU, LABBARB  Alcohol Level     Component Value  Date/Time   ETH <10 06/11/2023 0746   INR  Lab Results  Component Value Date   INR 1.0 Oct 31, 2023   APTT  Lab Results  Component Value Date   APTT 29 2023/10/31   CT head CTA H&N  1. 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 This was discussed by telephone with Dr. Bryson Carbine on 10-31-23 at 10:46 .   2. No other intracranial large vessel or circle-of-Willis branch occlusion identified. Patent distal Right ICA stent. Advanced atherosclerosis throughout the head and neck.   * No acute intracranial abnormality by plain CT. Chronic posterior right MCA encephalomalacia.   *Emphysema (ICD10-J43.9), with abnormal left upper lung masslike opacity. PET-CT was recommended in February.   * Aortic Atherosclerosis (ICD10-I70.0).  CNS imaging personally reviewed and discussed by phone with neurointerventionalist Dr. Rosamaria Comer  ASSESSMENT   GARMON DEHN is a 58 y.o. male with hx of HTN, HL, stroke in Feb 2025 2/2 acute R ICA occlusion that was revascularized and stented.  He has no residual deficits from his prior stroke.  Today he presented with transient R eye vision loss that has since completely resolved. He is back to baseline with NIHSS = 0. TNK was not administered 2/2 return to baseline. Somewhat surprisingly, his CTA showed possible new L ICA occlusion in cavernous portion which was new from February and favored to be acute by radiology. (Note: sx do not localize, ICA occlusion or embolus could cause unilateral visual disturbance in ipsilateral eye but not contralateral).  He does require admission for TIA workup but there is no indication for emergent intervention given that he has no neurologic deficits at this time the risk would outweigh any benefit. I recommended admission to [facility]  from [facility] ED so that if his neurologic exam worsens he could have repeat CTA/CTP and prompt emergent revascularization if indicated at that time. He stopped taking Brillinta 2 weeks ago bc he was out of refills and his PCP told him he could stop. He had no complications with the medication. I checked with Dr. Alvira Josephs and all patients should be on Brillinta for a  minimum of 6 mos post ICA stenting, and some longer. Brillinta restarted. In the future it should only be discontinued by Dr. Alvira Josephs when he feels it is appropriate.  If he remains asymptomatic during his admission to [facility], please contact Dr. Alvira Josephs at discharge so that he may arrange for outpatient diagnostic arteriogram (d/w Dr. Alvira Josephs).  RECOMMENDATIONS   - Admit to [facility] hospitalist service for stroke/TIA workup - Permissive HTN x48 hrs from sx onset or until stroke ruled out by MRI goal BP <220/110. PRN labetalol  or hydralazine if BP above these parameters. Avoid oral antihypertensives. - MRI brain wo contrast - TTE  - Check A1c and LDL + add statin per guidelines - Continue ASA 81mg  daily - Restart brillinta 90mg  bid, continue at discharge. This should only be stopped in the future by or after discussion with Dr. Alvira Josephs (unless patient has a complication that requires immediate discontinuation of brillinta) - q4 hr neuro checks - STAT head CT for any change in neuro exam - Reactivate stroke code for any new or worsening neurologic sx for CT/CTA/CTP and consideration of intervention if indicated - Tele - PT/OT/SLP - Stroke education - If he remains asymptomatic during his admission to [facility], please contact Dr. Alvira Josephs at discharge so that he may arrange for outpatient diagnostic arteriogram  D/w EDP Dr. Martina Sledge in person. D/w neurointerventionalist Rosamaria Comer, and neurologists Dr. Ardella Beaver and Dr. Sal Khaliqdina by phone  Please notify [facility] neurohospitalist upon patient's arrival to  [facility] ______________________________________________________________________    Signed, Eleni Griffin, MD [facility]

## 2023-10-04 NOTE — ED Notes (Signed)
 [facility] called for pts arrivle

## 2023-10-04 NOTE — ED Notes (Signed)
Pt hooked back up to monitor. 

## 2023-10-04 NOTE — ED Notes (Signed)
 Pt given meal tray and drink.

## 2023-10-04 NOTE — Code Documentation (Signed)
 Stroke Response Nurse Documentation Code Documentation  Henry Boone is a 58 y.o. male arriving to [facility] via [employer] on 10/04/2023 with past medical hx of ETOH, HTN, HLD, cerebral artery stenosis, sleep apnea, intracranial stent. On aspirin  81 mg daily and Brilinta  (ticagrelor ) 90 mg bid. Code stroke was activated by ED.   Patient from home where he was LKW at 0900 and now complaining of vision difficulty . Patient was bending over changing litter boxes when he had sudden onset blurred vision and transient vision loss of his right eye.   Stroke team at the bedside on patient arrival. Labs drawn and patient cleared for CT by Dr. Martina Sledge. Patient to CT with team. NIHSS 0, see documentation for details and code stroke times. Symptoms resolved full at time of evaulation. The following imaging was completed:  CT Head, CTA, and CTP. Patient is not a candidate for IV Thrombolytic due to resolution of symptoms, per MD. Patient is not a candidate for IR due to patient back to baseline, per MD.   Care Plan: every 2 hour NIHSS and Vital signs. Swallow screen per order.   Process Delays Noted:   Bedside handoff with ED RN Cleatis Curio  Stroke Response RN

## 2023-10-04 NOTE — ED Notes (Signed)
Code  stroke  called  to  [facility] 

## 2023-10-04 NOTE — ED Notes (Signed)
 MD Kinner at bedside updating pt. Pt states he is back at 100% now.

## 2023-10-04 NOTE — ED Notes (Signed)
 Cart taken room by [employer]. Button pressed and RN Edwina Gram on scene speaking with pt at this time.

## 2023-10-04 NOTE — ED Notes (Signed)
 [facility] given report and should be here in about 15 minutes.

## 2023-10-04 NOTE — ED Notes (Signed)
Pt ambulated to the bathroom independently.

## 2023-10-04 NOTE — ED Notes (Signed)
 [facility]  called  per   Dr  Martina Sledge MD  for  a  transfer

## 2023-10-04 NOTE — ED Notes (Signed)
 Esign not working in room, pt verbalized transfer understanding and denies any questions at this time.

## 2023-10-04 NOTE — ED Provider Notes (Signed)
 [facility] Provider Note    Event Date/Time   First MD Initiated Contact with Patient 10/04/23 [phone]     (approximate)   History   Blurred Vision   HPI  Henry BEKKER is a 58 y.o. male with a history of CVA, reported right carotid artery stent who presents with right eye decreased vision now essentially resolved.  Patient reports that he was changing litter boxes about half an hour prior to arrival, right eye vision became fuzzy, worsened and for a brief period he could not see out of his right eye, he reports he is now recovered nearly full vision in the right eye.  He has no other deficits besides chronic deficits from old strokes.  He reports this was a similar presentation to how he experienced his first stroke in 2016.     Physical Exam   Triage Vital Signs: ED Triage Vitals  Encounter Vitals Group     BP 10/04/23 0919 (!) 155/111     Girls Systolic BP Percentile --      Girls Diastolic BP Percentile --      Boys Systolic BP Percentile --      Boys Diastolic BP Percentile --      Pulse Rate 10/04/23 0919 (!) 111     Resp 10/04/23 0919 16     Temp 10/04/23 0919 98 F (36.7 C)     Temp Source 10/04/23 0919 Oral     SpO2 10/04/23 0919 98 %     Weight 10/04/23 0920 66.7 kg (147 lb)     Height 10/04/23 0920 1.829 m (6')     Head Circumference --      Peak Flow --      Pain Score 10/04/23 0933 0     Pain Loc --      Pain Education --      Exclude from Growth Chart --     Most recent vital signs: Vitals:   10/04/23 1200 10/04/23 1215  BP:    Pulse: 70 76  Resp: 19 20  Temp:    SpO2: 100% 100%     General: Awake, no distress.  CV:  Good peripheral perfusion.  Resp:  Normal effort.  Abd:  No distention.  Other:  PERRLA, EOMI, cranial nerves II through XII are normal, normal strength in all extremities   ED Results / Procedures / Treatments   Labs (all labs ordered are listed, but only abnormal results are displayed) Labs Reviewed   COMPREHENSIVE METABOLIC PANEL WITH GFR - Abnormal; Notable for the following components:      Result Value   Glucose, Bld 139 (*)    All other components within normal limits  CBG MONITORING, ED - Abnormal; Notable for the following components:   Glucose-Capillary 132 (*)    All other components within normal limits  PROTIME-INR  APTT  CBC  DIFFERENTIAL  URINE DRUG SCREEN, QUALITATIVE ([facility] ONLY)     EKG  ED ECG REPORT I, Bryson Carbine, the attending physician, personally viewed and interpreted this ECG.  Date: 10/04/2023  Rhythm: normal sinus rhythm QRS Axis: normal Intervals: Prolonged QT ST/T Wave abnormalities: normal Narrative Interpretation: no evidence of acute ischemia    RADIOLOGY Contacted by Dr. Del Favia of radiology and informed of likely acute left ICA occlusion    PROCEDURES:  Critical Care performed: yes  CRITICAL CARE Performed by: Bryson Carbine   Total critical care time: 30 minutes  Critical care time was exclusive of  separately billable procedures and treating other patients.  Critical care was necessary to treat or prevent imminent or life-threatening deterioration.  Critical care was time spent personally by me on the following activities: development of treatment plan with patient and/or surrogate as well as nursing, discussions with consultants, evaluation of patient's response to treatment, examination of patient, obtaining history from patient or surrogate, ordering and performing treatments and interventions, ordering and review of laboratory studies, ordering and review of radiographic studies, pulse oximetry and re-evaluation of patient's condition.   Procedures   MEDICATIONS ORDERED IN ED: Medications  ticagrelor  (BRILINTA ) tablet 90 mg (90 mg Oral Given 10/04/23 1118)  iohexol  (OMNIPAQUE ) 350 MG/ML injection 75 mL (75 mLs Intravenous Contrast Given 10/04/23 1011)     IMPRESSION / MDM / ASSESSMENT AND PLAN / ED COURSE  I reviewed  the triage vital signs and the nursing notes. Patient's presentation is most consistent with acute presentation with potential threat to life or bodily function.  Patient presents with symptoms consistent with likely CVA/TIA, given his extensive history.  However he has improved significantly almost back to baseline, given this not a candidate for TNK, no indication for code stroke at this time  Will send for CT angio head and neck given his history of stent in his right ICA  Lab work reviewed and is overall reassuring  Contacted by Dr. Del Favia of radiology, he feels there is a new left ICA.  Reexamined the patient, he is now completely symptom-free.  However given concerning radiology findings, code stroke activated, the patient remains within the window  Appreciate Dr. Reuel Castle consultation, she recommends transfer to [facility] hospitalist service in case any new symptoms develop neuro IR will be closely available.  Discussed with Dr. Rufina Cough of the hospitalist team at [facility], he has accepted the patient in transfer      FINAL CLINICAL IMPRESSION(S) / ED DIAGNOSES   Final diagnoses:  None     Rx / DC Orders   ED Discharge Orders     None        Note:  This document was prepared using Dragon voice recognition software and may include unintentional dictation errors.   Bryson Carbine, MD 10/04/23 [phone]

## 2023-10-04 NOTE — Progress Notes (Signed)
 [facility] Telestroke RN Code Stroke Note:   1049- Cart acitvation - Pt has already had head CT done.   1052- Dr Doretta Gant paged  1057- Dr Doretta Gant at bedside - NIH started  1100- NIH completed =0, Not TNK candidate   LKW 0830  MRS 0

## 2023-10-04 NOTE — ED Provider Notes (Signed)
-----------------------------------------   4:07 PM on 10/04/2023 -----------------------------------------  We contacted [facility] to determine if the patient could be transferred to ED the ED, to expedite him being moved to [facility].  He has been accepted for ED to ED transfer by Dr. Drury Geralds.  He is asymptomatic and stable for transfer at this time.     Lind Repine, MD 10/04/23 [phone]

## 2023-10-04 NOTE — ED Notes (Signed)
 Pt unhooked for bathroom. Pt given urine cup to collect sample. Neurologist entered room to update pt on plans.

## 2023-10-04 NOTE — ED Notes (Signed)
 Pt unhooked and up to toilet, no assistance needed.

## 2023-10-04 NOTE — ED Triage Notes (Signed)
 Pt states approx 20 min ago while bending over changing litter boxes he had sudden onset of blurred vision to the right eye that is resolving but states that he still sees a gray area, pt denies having any numbness or tingling, pt has a steady gait, reports hx of stroke and hx of stent being placed in feb'25 in the artery on the right side of his brain

## 2023-10-04 NOTE — Progress Notes (Signed)
 Plan of Care Note for accepted transfer   Patient: Henry Boone MRN: [id number]   DOA: (Not on file)  Facility requesting transfer: [facility] Requesting Provider: Dr. Bryson Carbine MD. Reason for transfer: Specialists needs Facility course:   Presented with sudden onset blurry vision.  Symptoms are now improved.  But CTA head and neck showed evidence of new large vessel occlusion but with reconstitution.  Neurology recommended admission to [facility] in case neurointerventional radiology intervention is needed if symptoms recur/worsen.  Otherwise will need stroke workup.  Plan of care: The patient is accepted for admission to Telemetry versus progressive unit (first available), at [facility].  Author: Johnetta Nab, MD 10/04/2023  Check [email or web address] for on-call coverage.  Nursing staff, Please call [facility] Admits & Consults System-Wide number on Amion as soon as patient's arrival, so appropriate admitting provider can evaluate the pt.

## 2023-10-05 ENCOUNTER — Encounter (HOSPITAL_COMMUNITY): Payer: Self-pay | Admitting: Internal Medicine

## 2023-10-05 ENCOUNTER — Other Ambulatory Visit (HOSPITAL_COMMUNITY): Payer: Self-pay

## 2023-10-05 DIAGNOSIS — Z7982 Long term (current) use of aspirin: Secondary | ICD-10-CM

## 2023-10-05 DIAGNOSIS — I779 Disorder of arteries and arterioles, unspecified: Secondary | ICD-10-CM

## 2023-10-05 DIAGNOSIS — E785 Hyperlipidemia, unspecified: Secondary | ICD-10-CM

## 2023-10-05 DIAGNOSIS — I6389 Other cerebral infarction: Secondary | ICD-10-CM | POA: Diagnosis not present

## 2023-10-05 DIAGNOSIS — I63511 Cerebral infarction due to unspecified occlusion or stenosis of right middle cerebral artery: Secondary | ICD-10-CM | POA: Diagnosis not present

## 2023-10-05 DIAGNOSIS — I10 Essential (primary) hypertension: Secondary | ICD-10-CM

## 2023-10-05 DIAGNOSIS — I63521 Cerebral infarction due to unspecified occlusion or stenosis of right anterior cerebral artery: Secondary | ICD-10-CM

## 2023-10-05 DIAGNOSIS — R29818 Other symptoms and signs involving the nervous system: Secondary | ICD-10-CM | POA: Diagnosis not present

## 2023-10-05 DIAGNOSIS — F17210 Nicotine dependence, cigarettes, uncomplicated: Secondary | ICD-10-CM

## 2023-10-05 LAB — CBC
HCT: 46.7 % (ref 39.0–52.0)
Hemoglobin: 15.7 g/dL (ref 13.0–17.0)
MCH: 29.7 pg (ref 26.0–34.0)
MCHC: 33.6 g/dL (ref 30.0–36.0)
MCV: 88.4 fL (ref 80.0–100.0)
Platelets: 378 10*3/uL (ref 150–400)
RBC: 5.28 MIL/uL (ref 4.22–5.81)
RDW: 12.9 % (ref 11.5–15.5)
WBC: 9.2 10*3/uL (ref 4.0–10.5)
nRBC: 0 % (ref 0.0–0.2)

## 2023-10-05 LAB — LIPID PANEL
Cholesterol: 146 mg/dL (ref 0–200)
HDL: 36 mg/dL — ABNORMAL LOW (ref >40)
LDL Cholesterol: 98 mg/dL (ref 0–99)
Total CHOL/HDL Ratio: 4.1 ratio
Triglycerides: 60 mg/dL (ref <150)
VLDL: 12 mg/dL (ref 0–40)

## 2023-10-05 LAB — COMPREHENSIVE METABOLIC PANEL WITH GFR
ALT: 14 U/L (ref 0–44)
AST: 15 U/L (ref 15–41)
Albumin: 3.7 g/dL (ref 3.5–5.0)
Alkaline Phosphatase: 63 U/L (ref 38–126)
Anion gap: 5 (ref 5–15)
BUN: 8 mg/dL (ref 6–20)
CO2: 29 mmol/L (ref 22–32)
Calcium: 9.3 mg/dL (ref 8.9–10.3)
Chloride: 104 mmol/L (ref 98–111)
Creatinine, Ser: 0.91 mg/dL (ref 0.61–1.24)
GFR, Estimated: >60 mL/min (ref >60)
Glucose, Bld: 107 mg/dL — ABNORMAL HIGH (ref 70–99)
Potassium: 3.7 mmol/L (ref 3.5–5.1)
Sodium: 138 mmol/L (ref 135–145)
Total Bilirubin: 0.6 mg/dL (ref 0.0–1.2)
Total Protein: 6.4 g/dL — ABNORMAL LOW (ref 6.5–8.1)

## 2023-10-05 LAB — HEMOGLOBIN A1C
Hgb A1c MFr Bld: 5.8 % — ABNORMAL HIGH (ref 4.8–5.6)
Mean Plasma Glucose: 119.76 mg/dL

## 2023-10-05 MED ORDER — ROSUVASTATIN CALCIUM 40 MG PO TABS
40.0000 mg | ORAL_TABLET | Freq: Every day | ORAL | 0 refills | Status: AC
  Filled 2023-10-05: qty 30, 30d supply, fill #0

## 2023-10-05 MED ORDER — EZETIMIBE 10 MG PO TABS
10.0000 mg | ORAL_TABLET | Freq: Every day | ORAL | 0 refills | Status: AC
  Filled 2023-10-05: qty 30, 30d supply, fill #0

## 2023-10-05 MED ORDER — AMLODIPINE BESYLATE 5 MG PO TABS: 5.0000 mg | ORAL_TABLET | Freq: Every morning | ORAL | Status: AC

## 2023-10-05 MED ORDER — EZETIMIBE 10 MG PO TABS
10.0000 mg | ORAL_TABLET | Freq: Every day | ORAL | Status: DC
  Administered 2023-10-05: 10 mg via ORAL
  Filled 2023-10-05: qty 1

## 2023-10-05 MED ORDER — NICOTINE 21 MG/24HR TD PT24
21.0000 mg | MEDICATED_PATCH | Freq: Every day | TRANSDERMAL | Status: DC
  Administered 2023-10-05: 21 mg via TRANSDERMAL
  Filled 2023-10-05: qty 1

## 2023-10-05 MED ORDER — ROSUVASTATIN CALCIUM 20 MG PO TABS
40.0000 mg | ORAL_TABLET | Freq: Every day | ORAL | Status: DC
  Administered 2023-10-05: 40 mg via ORAL
  Filled 2023-10-05: qty 2

## 2023-10-05 MED ORDER — TICAGRELOR 90 MG PO TABS
90.0000 mg | ORAL_TABLET | Freq: Two times a day (BID) | ORAL | 0 refills | Status: DC
  Filled 2023-10-05: qty 60, 30d supply, fill #0

## 2023-10-05 MED ORDER — NICOTINE 21 MG/24HR TD PT24
21.0000 mg | MEDICATED_PATCH | Freq: Every day | TRANSDERMAL | 0 refills | Status: DC
  Filled 2023-10-05: qty 28, 28d supply, fill #0

## 2023-10-05 NOTE — Progress Notes (Signed)
 PT Cancellation Note  Patient Details Name: Henry Boone MRN: [id number] DOB: 1966-02-15   Cancelled Treatment:    Reason Eval/Treat Not Completed: OT screened, no needs identified, will sign off  Spoke with occupational therapy after their initial evaluation. OT reports patient is functioning at a high level of independence and no physical therapy is indicated at this time. PT is signing-off. Please re-order if there is any significant change in status. Thank you for this referral.  Leontine Roads, PT, DPT [facility]  Rehabilitation Services Physical Therapist Office: [phone] Website: [email or web address]     Leontine GORMAN Roads 10/05/2023, 11:04 AM

## 2023-10-05 NOTE — Discharge Instructions (Signed)
 Please get all your refills addressed by your primary care physician within 2 weeks of discharge.  Follow with Primary MD [name], MD in 7 days   Get CBC, CMP, TSH, A1c, lipid panel-  checked next visit with your primary MD   Activity: As tolerated with Full fall precautions use walker/cane & assistance as needed  Disposition Home    Diet: Heart Healthy    Special Instructions: If you have smoked or chewed Tobacco  in the last 2 yrs please stop smoking, stop any regular Alcohol  and or any Recreational drug use.  On your next visit with your primary care physician please Get Medicines reviewed and adjusted.  Please request your Prim.MD to go over all Hospital Tests and Procedure/Radiological results at the follow up, please get all Hospital records sent to your Prim MD by signing hospital release before you go home.  If you experience worsening of your admission symptoms, develop shortness of breath, life threatening emergency, suicidal or homicidal thoughts you must seek medical attention immediately by calling 911 or calling your MD immediately  if symptoms less severe.  You Must read complete instructions/literature along with all the possible adverse reactions/side effects for all the Medicines you take and that have been prescribed to you. Take any new Medicines after you have completely understood and accpet all the possible adverse reactions/side effects.   Do not drive when taking Pain medications.  Do not take more than prescribed Pain, Sleep and Anxiety Medications  Wear Seat belts while driving.

## 2023-10-05 NOTE — Discharge Summary (Signed)
 Henry Boone FMW:[id number] DOB: 1966-03-13 DOA: 10/04/2023  PCP: [name], MD  Admit date: 10/04/2023  Discharge date: 10/05/2023  Admitted From: Home   Disposition:  Home   Recommendations for Outpatient Follow-up:   Follow up with PCP in 1-2 weeks  PCP Please obtain BMP/CBC, 2 view CXR in 1week,  (see Discharge instructions)   PCP Please follow up on the following pending results: Monitor secondary risk factors for stroke closely, please refill needed medications.   Home Health: None   Equipment/Devices: None  Consultations: Neuro Discharge Condition: Stable    CODE STATUS: Full    Diet Recommendation: Heart Healthy     Chief Complaint  Patient presents with   Transient Ischemic Attack     Brief history of present illness from the day of admission and additional interim summary    58 y.o. male with medical history significant of hypertension, hyperlipidemia, CVA, carotid artery disease presenting with vision changes.   Patient reports that he was changing litter boxes earlier today and had sudden blurry vision of his right eye.  The vision briefly worsened to the point where he was blind in that eye but it then improved to blurry again.  Has had gradual improvement since.  Now back to normal.  Workup was consistent with another acute stroke.                                                                 Hospital Course     Focal neurologic deficit TIA versus CVA History of CVA Carotid artery disease > Patient presenting with blurry vision that was sudden onset in his right eye, workup i.e. MRI positive for stroke, CTA noted Case discussed with stroke team Dr. Jerri in detail, patient's symptoms completely resolved his back to baseline, he did miss Brilinta  for 2 to 3 weeks due to confusion  with refills, he also continues to smoke.  He was counseled on the importance of quitting smoking and to get in touch with his PCP in a timely fashion for refills.  He had echocardiogram recently done hence it will not be repeated, symptoms have resolved, cleared for discharge on DAPT along with home statin, follow-up with IR and neurology postdischarge along with PCP.   Hypertension - Permissive hypertension for the next few days then resume home Norvasc .   Hyperlipidemia - LDL was above goal, now placed on combination of Crestor  and Zetia .  Ongoing smoking.  Counseled to quit.  NicoDerm patch.    Discharge diagnosis     Principal Problem:   Focal neurological deficit Active Problems:   Carotid stenosis, right   History of ischemic multifocal multiple vascular territories stroke   Essential hypertension   Hyperlipidemia    Discharge instructions    Discharge Instructions     Ambulatory  referral to Neurology   Complete by: As directed    Follow up with stroke clinic NP at [facility] in about 4-6 weeks. Thanks.   Diet - low sodium heart healthy   Complete by: As directed    Discharge instructions   Complete by: As directed    Please get all your refills addressed by your primary care physician within 2 weeks of discharge.  Follow with Primary MD [name], MD in 7 days   Get CBC, CMP, TSH, A1c, lipid panel-  checked next visit with your primary MD   Activity: As tolerated with Full fall precautions use walker/cane & assistance as needed  Disposition Home    Diet: Heart Healthy    Special Instructions: If you have smoked or chewed Tobacco  in the last 2 yrs please stop smoking, stop any regular Alcohol  and or any Recreational drug use.  On your next visit with your primary care physician please Get Medicines reviewed and adjusted.  Please request your Prim.MD to go over all Hospital Tests and Procedure/Radiological results at the follow up, please get all Hospital  records sent to your Prim MD by signing hospital release before you go home.  If you experience worsening of your admission symptoms, develop shortness of breath, life threatening emergency, suicidal or homicidal thoughts you must seek medical attention immediately by calling 911 or calling your MD immediately  if symptoms less severe.  You Must read complete instructions/literature along with all the possible adverse reactions/side effects for all the Medicines you take and that have been prescribed to you. Take any new Medicines after you have completely understood and accpet all the possible adverse reactions/side effects.   Do not drive when taking Pain medications.  Do not take more than prescribed Pain, Sleep and Anxiety Medications  Wear Seat belts while driving.   Increase activity slowly   Complete by: As directed        Discharge Medications   Allergies as of 10/05/2023   No Known Allergies      Medication List     STOP taking these medications    pravastatin  40 MG tablet Commonly known as: Pravachol        TAKE these medications    amLODipine  5 MG tablet Commonly known as: NORVASC  Take 1 tablet (5 mg total) by mouth in the morning. Start taking on: October 07, 2023 What changed: These instructions start on October 07, 2023. If you are unsure what to do until then, ask your doctor or other care provider.   Aspirin  Low Dose 81 MG chewable tablet Generic drug: aspirin  Chew 1 tablet (81 mg total) by mouth daily. What changed: when to take this   ezetimibe  10 MG tablet Commonly known as: ZETIA  Take 1 tablet (10 mg total) by mouth daily.   multivitamin with minerals Tabs tablet Take 1 tablet by mouth daily. What changed:  when to take this additional instructions   nicotine  21 mg/24hr patch Commonly known as: NICODERM CQ  - dosed in mg/24 hours Place 1 patch (21 mg total) onto the skin daily.   rosuvastatin  40 MG tablet Commonly known as: CRESTOR  Take 1  tablet (40 mg total) by mouth daily.   ticagrelor  90 MG Tabs tablet Commonly known as: BRILINTA  Take 1 tablet (90 mg total) by mouth 2 (two) times daily.         Follow-up Information     [name], MD. Schedule an appointment as soon as possible for a visit in  1 week(s).   Specialty: Family Medicine Contact information: [address] [facility] [place] [address] [phone]         [facility]. Schedule an appointment as soon as possible for a visit in 1 month(s).   Why: stroke clinic Contact information: [address]     [address] [place]  [address] [phone]        Dolphus Carrion, MD. Schedule an appointment as soon as possible for a visit in 2 week(s).   Specialties: Interventional Radiology, Radiology Contact information: [address] [place] [address] [phone]                 Major procedures and Radiology Reports - PLEASE review detailed and final reports thoroughly  -       MR BRAIN WO CONTRAST Result Date: 10/04/2023 CLINICAL DATA:  Acute neurologic deficit EXAM: MRI HEAD WITHOUT CONTRAST TECHNIQUE: Multiplanar, multiecho pulse sequences of the brain and surrounding structures were obtained without intravenous contrast. COMPARISON:  06/11/2023 FINDINGS: Brain: Punctate focus of abnormal diffusion restriction within the posterior right temporal lobe. Faint residual diffusion-weighted hyperintensity in the posterior right parietal lobe and posterior right frontal lobe at the site of recent infarct. Chronic blood products in the right parietal lobe. There is multifocal hyperintense T2-weighted signal within the white matter. Parenchymal volume and CSF spaces are normal. Old left frontal and parietal and occipital subcortical infarcts. The midline structures are normal. Vascular: Normal flow voids. Skull and upper cervical spine: Normal calvarium and skull base.  Visualized upper cervical spine and soft tissues are normal. Sinuses/Orbits:No paranasal sinus fluid levels or advanced mucosal thickening. No mastoid or middle ear effusion. Normal orbits. IMPRESSION: 1. Punctate focus of acute ischemia within the posterior right temporal lobe. No hemorrhage or mass effect. 2. Faint residual diffusion changes in the posterior right parietal and frontal lobes at the site of recent infarct. 3. Old left frontal, parietal and occipital subcortical infarcts. Electronically Signed   By: Franky Stanford M.D.   On: 10/04/2023 21:33   CT ANGIO HEAD NECK W WO CM Result Date: 10/04/2023 CLINICAL DATA:  58 year old male with TIA, sudden onset right eye vision changes. Status post emergent right ICA occlusion in February, endovascular revascularization. EXAM: CT ANGIOGRAPHY HEAD AND NECK WITH AND WITHOUT CONTRAST TECHNIQUE: Multidetector CT imaging of the head and neck was performed using the standard protocol during bolus administration of intravenous contrast. Multiplanar CT image reconstructions and MIPs were obtained to evaluate the vascular anatomy. Carotid stenosis measurements (when applicable) are obtained utilizing NASCET criteria, using the distal internal carotid diameter as the denominator. RADIATION DOSE REDUCTION: This exam was performed according to the departmental dose-optimization program which includes automated exposure control, adjustment of the mA and/or kV according to patient size and/or use of iterative reconstruction technique. CONTRAST:  75mL OMNIPAQUE  IOHEXOL  350 MG/ML SOLN COMPARISON:  Head CT and prior CTA 06/11/2023. FINDINGS: CT HEAD Brain: No midline shift, ventriculomegaly, mass effect, evidence of mass lesion, intracranial hemorrhage or evidence of cortically based acute infarction. Stable gray-white matter differentiation throughout the brain. Chronic posterior right MCA territory encephalomalacia appears stable since February. Calvarium and skull base:  Stable and intact. Paranasal sinuses: Visualized paranasal sinuses and mastoids are stable and well aerated. Orbits: Visualized orbits and scalp soft tissues are within normal limits. CTA NECK Skeleton: No acute osseous abnormality identified. Upper chest: Chronic fairly advanced emphysema in the upper lungs. Abnormal posterior left upper lobe spiculated and nodular opacity, present  on 06/11/2023 chest CT and appears stable to slightly larger (please see that report) No superimposed superior mediastinal lymphadenopathy. Other neck: Nonvascular neck soft tissue spaces appear within normal limits. Aortic arch: Calcified aortic atherosclerosis.  3 vessel arch. Right carotid system: Brachiocephalic artery and right carotid atherosclerosis. No significant stenosis to the skull base. Left carotid system: Left CCA soft and calcified plaque including low-density plaque in the mid to distal left CCA. Soft and calcified plaque at the left ICA origin and bulb with less than 50 % stenosis with respect to the distal vessel. More advanced plaque and stenosis just below the skull base, estimated at 60%. See intracranial findings below. Vertebral arteries: Proximal subclavian artery atherosclerosis is stable. Left vertebral artery arises early with atherosclerosis at its origin, stable. Both vertebral arteries remain patent to the skull base, fairly codominant. CTA HEAD Posterior circulation: Diminutive vertebrobasilar system proximally due to persistent left side trigeminal artery. Diminutive distal vertebral arteries, PICA origins, diminutive proximal basilar artery appear stable and patent. Persistent trigeminal artery, see PCA details below. Anterior circulation: Right ICA siphon has been revascularized since 06/11/2023 but with severe left siphon calcified atherosclerosis and superimposed patent vascular stent. Right ICA terminus is patent. Left ICA siphon also highly atherosclerotic, and is newly occluded in the cavernous  segment on series 14, image 135. A persistent left side trigeminal artery remains patent just proximal to the occlusion, with calcified plaque at its origin. No reconstitution of the supraclinoid left ICA. The left MCA and ACA origins are being reconstituted likely in part from the anterior communicating artery. Distal basilar artery supplied via persistent trigeminal, and contralateral fetal type right PCA origin. Bilateral PCA branches are within normal limits. Left MCA M1 segment and bifurcation remain patent without stenosis. Anterior communicating artery, bilateral ACA branches are stable and within normal limits. Right MCA M1 segment and bifurcation are patent without stenosis. Bilateral MCA branches are stable. No branch occlusion. Venous sinuses: Early contrast timing, grossly patent. Anatomic variants: Persistent left side trigeminal artery which supplies the distal basilar. Superimposed fetal type right PCA origin. Review of the MIP images confirms the above findings IMPRESSION: 1. 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 This was discussed by telephone with Dr. LAMAR PRICE on 10/04/2023 at 10:46 . 2. No other intracranial large vessel or circle-of-Willis branch occlusion identified. Patent distal Right ICA stent. Advanced atherosclerosis throughout the head and neck. * No acute intracranial abnormality by plain CT. Chronic posterior right MCA encephalomalacia. *Emphysema (ICD10-J43.9), with abnormal left upper lung masslike opacity. PET-CT was recommended in February. * Aortic Atherosclerosis (ICD10-I70.0). Electronically Signed   By: VEAR Hurst M.D.   On: 10/04/2023 10:56   CARDIAC EVENT MONITOR Result Date: 09/16/2023 Sinus rhythm 212,608 premature ventricular contractions    Micro Results    No results found  for this or any previous visit (from the past 240 hours).  Today   Subjective    Gavon Majano today has no headache,no chest abdominal pain,no new weakness tingling or numbness, feels much better wants to go home today.    Objective   Blood pressure (!) 148/102, pulse 85, temperature (!) 97.5 F (36.4 C), temperature source Oral, resp. rate 15, height 6' (1.829 m), weight 66.7 kg, SpO2 93%.   Intake/Output Summary (Last 24 hours) at 10/05/2023 1140 Last data filed at 10/04/2023 1800  Gross per 24 hour  Intake 60 ml  Output --  Net 60 ml    Exam  Awake Alert, No new F.N deficits,    [place].AT,PERRAL Supple Neck,   Symmetrical Chest wall movement, Good air movement bilaterally, CTAB RRR,No Gallops,   +ve B.Sounds, Abd Soft, Non tender,  No Cyanosis, Clubbing or edema    Data Review   Recent Labs  Lab 10/04/23 0923 10/05/23 0752  WBC 10.1 9.2  HGB 14.8 15.7  HCT 44.6 46.7  PLT 382 378  MCV 88.8 88.4  MCH 29.5 29.7  MCHC 33.2 33.6  RDW 12.8 12.9  LYMPHSABS 1.9  --   MONOABS 0.9  --   EOSABS 0.1  --   BASOSABS 0.1  --     Recent Labs  Lab 10/04/23 0923 10/05/23 0752  NA 137 138  K 4.0 3.7  CL 103 104  CO2 24 29  ANIONGAP 10 5  GLUCOSE 139* 107*  BUN 11 8  CREATININE 0.68 0.91  AST 16 15  ALT 13 14  ALKPHOS 66 63  BILITOT 0.6 0.6  ALBUMIN  4.0 3.7  INR 1.0  --   HGBA1C  --  5.8*  CALCIUM  9.3 9.3   Lab Results  Component Value Date   HGBA1C 5.8 (H) 10/05/2023   Lab Results  Component Value Date   CHOL 146 10/05/2023   HDL 36 (L) 10/05/2023   LDLCALC 98 10/05/2023   TRIG 60 10/05/2023   CHOLHDL 4.1 10/05/2023    Total Time in preparing paper work, data evaluation and todays exam - 35 minutes  Signature  -    Lavada Stank M.D on 10/05/2023 at 11:40 AM   -  To page go to [email or web address]

## 2023-10-05 NOTE — Evaluation (Signed)
 Occupational Therapy Evaluation Patient Details Name: Henry Boone MRN: [id number] DOB: 1966-04-15 Today's Date: 10/05/2023   History of Present Illness   58 y.o. male presenting with vision changes, with medical history significant of hypertension, hyperlipidemia, CVA, carotid artery disease     Clinical Impressions Pt has no complaints, resting comfortably in bed, feeling back to 100 percent. Pt lives at home with wife, they both work, PLOF independent, drives. Pt at this time is at baseline level of independent with ADLs/mobility. Pt has history of CVA with no residual motor deficits, some numbness to skin on L face/neck and abdomen. Pt BP noted to be high, 168/94, history of high BP. At this time Pt has no acute or follow up OT needs, signing off.      If plan is discharge home, recommend the following:         Functional Status Assessment   Patient has had a recent decline in their functional status and demonstrates the ability to make significant improvements in function in a reasonable and predictable amount of time.     Equipment Recommendations   None recommended by OT     Recommendations for Other Services         Precautions/Restrictions   Precautions Precautions: None Recall of Precautions/Restrictions: Intact Restrictions Weight Bearing Restrictions Per Provider Order: No     Mobility Bed Mobility Overal bed mobility: Independent                  Transfers Overall transfer level: Independent Equipment used: None                      Balance Overall balance assessment: Independent                                         ADL either performed or assessed with clinical judgement   ADL Overall ADL's : Independent                                             Vision Baseline Vision/History: 0 No visual deficits Ability to See in Adequate Light: 0 Adequate Patient Visual Report: No change  from baseline       Perception         Praxis         Pertinent Vitals/Pain Pain Assessment Pain Assessment: No/denies pain     Extremity/Trunk Assessment Upper Extremity Assessment Upper Extremity Assessment: Overall WFL for tasks assessed   Lower Extremity Assessment Lower Extremity Assessment: Defer to PT evaluation       Communication Communication Communication: No apparent difficulties   Cognition Arousal: Alert Behavior During Therapy: WFL for tasks assessed/performed Cognition: No apparent impairments                               Following commands: Intact       Cueing  General Comments   Cueing Techniques: Verbal cues      Exercises     Shoulder Instructions      Home Living Family/patient expects to be discharged to:: Private residence Living Arrangements: Spouse/significant other Available Help at Discharge: Family;Available PRN/intermittently Type of Home: House Home Access: Stairs to enter [employer] of  Steps: 2 Entrance Stairs-Rails: None Home Layout: One level     Bathroom Shower/Tub: Chief Strategy Officer: Standard     Home Equipment: None   Additional Comments: Pt lives with wife who works during the day      Prior Functioning/Environment Prior Level of Function : Independent/Modified Independent;Working/employed;Driving             Mobility Comments: works for [employer]      OT Problem List: Impaired vision/perception   OT Treatment/Interventions:        OT Goals(Current goals can be found in the care plan section)   Acute Rehab OT Goals Patient Stated Goal: return home OT Goal Formulation: With patient Time For Goal Achievement: 10/19/23 Potential to Achieve Goals: Good   OT Frequency:       Co-evaluation              AM-PAC OT 6 Clicks Daily Activity     Outcome Measure Help from another person eating meals?: None Help from another person taking care of  personal grooming?: None Help from another person toileting, which includes using toliet, bedpan, or urinal?: None Help from another person bathing (including washing, rinsing, drying)?: None Help from another person to put on and taking off regular upper body clothing?: None Help from another person to put on and taking off regular lower body clothing?: None 6 Click Score: 24   End of Session Nurse Communication: Mobility status  Activity Tolerance: Patient tolerated treatment well Patient left: in bed;with call bell/phone within reach  OT Visit Diagnosis: Low vision, both eyes (H54.2)                Time: 1036-1050 OT Time Calculation (min): 14 min Charges:  OT General Charges $OT Visit: 1 Visit OT Evaluation $OT Eval Low Complexity: 1 Low  [address] OTR/L   Elouise JONELLE Bott 10/05/2023, 10:59 AM

## 2023-10-05 NOTE — Evaluation (Signed)
 Speech Language Pathology Evaluation Patient Details Name: Henry Boone MRN: [id number] DOB: October 14, 1965 Today's Date: 10/05/2023 Time: 1210-1230 SLP Time Calculation (min) (ACUTE ONLY): 20 min  Problem List:  Patient Active Problem List   Diagnosis Date Noted   Acute CVA (cerebrovascular accident) (HCC) 10/04/2023   Focal neurological deficit 10/04/2023   Acute cerebrovascular accident (CVA) due to occlusion of right carotid artery (HCC) 06/11/2023   Internal carotid artery occlusion, right 06/11/2023   Carotid occlusion, left 07/25/2017   Carotid stenosis, right 07/25/2017   History of ischemic multifocal multiple vascular territories stroke 07/25/2017   Essential hypertension 07/25/2017   Hyperlipidemia 07/25/2017   Tobacco use disorder 07/25/2017   Leukocytosis 07/25/2017   Cerebrovascular accident (CVA) due to bilateral occlusion of middle cerebral arteries (HCC) 07/23/2017   Cerebral thrombosis with cerebral infarction (HCC) 11/12/2014   Amaurosis fugax 11/11/2014   Past Medical History:  Past Medical History:  Diagnosis Date   ETOH abuse    History of cerebral artery stenosis    Hyperlipidemia    Hypertension    Stroke [facility])    Past Surgical History:  Past Surgical History:  Procedure Laterality Date   COLONOSCOPY WITH PROPOFOL  N/A 01/20/2019   Procedure: COLONOSCOPY WITH PROPOFOL ;  Surgeon: Toledo, Ladell POUR, MD;  Location: [facility] ENDOSCOPY;  Service: Gastroenterology;  Laterality: N/A;   IR ANGIO EXTRACRAN SEL COM CAROTID INNOMINATE UNI BILAT MOD SED  07/23/2017   IR ANGIO INTRA EXTRACRAN SEL COM CAROTID INNOMINATE UNI R MOD SED  11/07/2017   IR ANGIO VERTEBRAL SEL SUBCLAVIAN INNOMINATE UNI R MOD SED  11/07/2017   IR ANGIO VERTEBRAL SEL VERTEBRAL BILAT MOD SED  07/23/2017   IR CT HEAD LTD  06/11/2023   IR CT HEAD LTD  06/11/2023   IR INTRA CRAN STENT  06/11/2023   IR PERCUTANEOUS ART THROMBECTOMY/INFUSION INTRACRANIAL INC DIAG ANGIO  06/11/2023   IR RADIOLOGIST EVAL & MGMT   06/28/2023   MICROLARYNGOSCOPY N/A 06/30/2019   Procedure: SUSPENSION MICROLARYNGOSCOPY WITH MICROFLAP;  Surgeon: Milissa Hamming, MD;  Location: [facility] ORS;  Service: ENT;  Laterality: N/A;   RADIOLOGY WITH ANESTHESIA N/A 11/07/2017   Procedure: STENTING;  Surgeon: Dolphus Carrion, MD;  Location: [facility] OR;  Service: Radiology;  Laterality: N/A;   RADIOLOGY WITH ANESTHESIA N/A 06/11/2023   Procedure: IR WITH ANESTHESIA;  Surgeon: Radiologist, Medication, MD;  Location: [facility] OR;  Service: Radiology;  Laterality: N/A;   TEE WITHOUT CARDIOVERSION N/A 11/15/2014   Procedure: TRANSESOPHAGEAL ECHOCARDIOGRAM (TEE);  Surgeon: Evalene JINNY Lunger, MD;  Location: [facility] ORS;  Service: Cardiovascular;  Laterality: N/A;   TONSILLECTOMY     HPI:  58 yo male adm to [facility] with vision changes suddent onset.  PMH + for hypertension, hyperlipidemia, CVA, ETOH. tobacco use. Imaging showed acute ischemia within the posterior right temporal lobe. No hemorrhage or mass effect Faint residual diffusion changes in the posterior right parietal and frontal lobes at the site of recent infarct. Old left frontal, parietal and occipital subcortical infarcts.  Cognitive linguistic eval ordered.   Assessment / Plan / Recommendation Clinical Impression  [place] Mental Status Exam administered with pt scoring 28/30 which is [facility] for his education. He scored perfectly on all items but benefited from visual cue to move hour hand to appropriate location on the clock (minimally off).  Otherwise he scored perfectly on all items.  His speech and language are clear and fluent with no evidence of expressive/receptive language deficits nor dysfluency or dysarthria.   Pt does admit to  ongoing facial sensory changes *like coming off lidocaine  - on right lower lip and left proximal ear.  He does endorse occasional smoking and is currently wearing a nicotine  patch.  No SLP intervention warranted at this time.    SLP Assessment  SLP Recommendation/Assessment:  Patient does not need any further Speech Language Pathology Services SLP Visit Diagnosis: Cognitive communication deficit (R41.841)     Assistance Recommended at Discharge  None  Functional Status Assessment Patient has not had a recent decline in their functional status  Frequency and Duration           SLP Evaluation Cognition  Overall Cognitive Status: Within Functional Limits for tasks assessed Arousal/Alertness: Awake/alert Orientation Level: Oriented X4 Year: 2025 Month: June Day of Week: Correct Attention: Alternating Alternating Attention: Appears intact Memory: Appears intact Awareness: Appears intact Problem Solving: Appears intact Safety/Judgment: Appears intact       Comprehension  Auditory Comprehension Overall Auditory Comprehension: Appears within functional limits for tasks assessed Yes/No Questions: Not tested Commands: Within Functional Limits Conversation: Complex Interfering Components: Attention Visual Recognition/Discrimination Discrimination: Within Function Limits Reading Comprehension Reading Status: Not tested    Expression Expression Primary Mode of Expression: Verbal Verbal Expression Overall Verbal Expression: Appears within functional limits for tasks assessed Initiation: No impairment Repetition:  (DNT) Naming: No impairment Pragmatics: No impairment Written Expression Dominant Hand: Right Written Expression:  (DNT, able to conduct clock drawing evaluation)   Oral / Motor  Oral Motor/Sensory Function Overall Oral Motor/Sensory Function: Within functional limits Motor Speech Overall Motor Speech: Appears within functional limits for tasks assessed Respiration: Within functional limits Resonance: Within functional limits Articulation: Within functional limitis Intelligibility: Intelligible Motor Planning: Not tested            Henry Boone 10/05/2023, 12:40 PM Henry POUR, MS [facility] SLP Acute Rehab Services Office  [phone]

## 2023-10-05 NOTE — Progress Notes (Signed)
 STROKE TEAM PROGRESS NOTE   SUBJECTIVE (INTERVAL HISTORY) No family is at the bedside.  Overall his condition is completely resolved. He stated that his right eye blurry vision has resolved. He had similar episode in 2016 and now still has small right central vision loss.    OBJECTIVE Temp:  [97.5 F (36.4 C)-98 F (36.7 C)] 98 F (36.7 C) (06/21 1229) Pulse Rate:  [67-88] 88 (06/21 1229) Cardiac Rhythm: Normal sinus rhythm (06/21 0743) Resp:  [15-19] 18 (06/21 1229) BP: (136-173)/(73-102) 173/89 (06/21 1229) SpO2:  [93 %-98 %] 97 % (06/21 1229)  Recent Labs  Lab 10/04/23 0925  GLUCAP 132*   Recent Labs  Lab 10/04/23 0923 10/05/23 0752  NA 137 138  K 4.0 3.7  CL 103 104  CO2 24 29  GLUCOSE 139* 107*  BUN 11 8  CREATININE 0.68 0.91  CALCIUM  9.3 9.3   Recent Labs  Lab 10/04/23 0923 10/05/23 0752  AST 16 15  ALT 13 14  ALKPHOS 66 63  BILITOT 0.6 0.6  PROT 6.7 6.4*  ALBUMIN  4.0 3.7   Recent Labs  Lab 10/04/23 0923 10/05/23 0752  WBC 10.1 9.2  NEUTROABS 7.1  --   HGB 14.8 15.7  HCT 44.6 46.7  MCV 88.8 88.4  PLT 382 378   No results for input(s): CKTOTAL, CKMB, CKMBINDEX, TROPONINI in the last 168 hours. Recent Labs    10/04/23 0923  LABPROT 12.9  INR 1.0   No results for input(s): COLORURINE, LABSPEC, PHURINE, GLUCOSEU, HGBUR, BILIRUBINUR, KETONESUR, PROTEINUR, UROBILINOGEN, NITRITE, LEUKOCYTESUR in the last 72 hours.  Invalid input(s): APPERANCEUR     Component Value Date/Time   CHOL 146 10/05/2023 0752   TRIG 60 10/05/2023 0752   HDL 36 (L) 10/05/2023 0752   CHOLHDL 4.1 10/05/2023 0752   VLDL 12 10/05/2023 0752   LDLCALC 98 10/05/2023 0752   Lab Results  Component Value Date   HGBA1C 5.8 (H) 10/05/2023      Component Value Date/Time   LABOPIA NONE DETECTED 10/04/2023 1140   COCAINSCRNUR NONE DETECTED 10/04/2023 1140   LABBENZ NONE DETECTED 10/04/2023 1140   AMPHETMU NONE DETECTED 10/04/2023 1140   THCU  NONE DETECTED 10/04/2023 1140   LABBARB NONE DETECTED 10/04/2023 1140    No results for input(s): ETH in the last 168 hours.  I have personally reviewed the radiological images below and agree with the radiology interpretations.  MR BRAIN WO CONTRAST Result Date: 10/04/2023 CLINICAL DATA:  Acute neurologic deficit EXAM: MRI HEAD WITHOUT CONTRAST TECHNIQUE: Multiplanar, multiecho pulse sequences of the brain and surrounding structures were obtained without intravenous contrast. COMPARISON:  06/11/2023 FINDINGS: Brain: Punctate focus of abnormal diffusion restriction within the posterior right temporal lobe. Faint residual diffusion-weighted hyperintensity in the posterior right parietal lobe and posterior right frontal lobe at the site of recent infarct. Chronic blood products in the right parietal lobe. There is multifocal hyperintense T2-weighted signal within the white matter. Parenchymal volume and CSF spaces are normal. Old left frontal and parietal and occipital subcortical infarcts. The midline structures are normal. Vascular: Normal flow voids. Skull and upper cervical spine: Normal calvarium and skull base. Visualized upper cervical spine and soft tissues are normal. Sinuses/Orbits:No paranasal sinus fluid levels or advanced mucosal thickening. No mastoid or middle ear effusion. Normal orbits. IMPRESSION: 1. Punctate focus of acute ischemia within the posterior right temporal lobe. No hemorrhage or mass effect. 2. Faint residual diffusion changes in the posterior right parietal and frontal lobes at the site of  recent infarct. 3. Old left frontal, parietal and occipital subcortical infarcts. Electronically Signed   By: Franky Stanford M.D.   On: 10/04/2023 21:33   CT ANGIO HEAD NECK W WO CM Result Date: 10/04/2023 CLINICAL DATA:  58 year old male with TIA, sudden onset right eye vision changes. Status post emergent right ICA occlusion in February, endovascular revascularization. EXAM: CT  ANGIOGRAPHY HEAD AND NECK WITH AND WITHOUT CONTRAST TECHNIQUE: Multidetector CT imaging of the head and neck was performed using the standard protocol during bolus administration of intravenous contrast. Multiplanar CT image reconstructions and MIPs were obtained to evaluate the vascular anatomy. Carotid stenosis measurements (when applicable) are obtained utilizing NASCET criteria, using the distal internal carotid diameter as the denominator. RADIATION DOSE REDUCTION: This exam was performed according to the departmental dose-optimization program which includes automated exposure control, adjustment of the mA and/or kV according to patient size and/or use of iterative reconstruction technique. CONTRAST:  75mL OMNIPAQUE  IOHEXOL  350 MG/ML SOLN COMPARISON:  Head CT and prior CTA 06/11/2023. FINDINGS: CT HEAD Brain: No midline shift, ventriculomegaly, mass effect, evidence of mass lesion, intracranial hemorrhage or evidence of cortically based acute infarction. Stable gray-white matter differentiation throughout the brain. Chronic posterior right MCA territory encephalomalacia appears stable since February. Calvarium and skull base: Stable and intact. Paranasal sinuses: Visualized paranasal sinuses and mastoids are stable and well aerated. Orbits: Visualized orbits and scalp soft tissues are within normal limits. CTA NECK Skeleton: No acute osseous abnormality identified. Upper chest: Chronic fairly advanced emphysema in the upper lungs. Abnormal posterior left upper lobe spiculated and nodular opacity, present on 06/11/2023 chest CT and appears stable to slightly larger (please see that report) No superimposed superior mediastinal lymphadenopathy. Other neck: Nonvascular neck soft tissue spaces appear within normal limits. Aortic arch: Calcified aortic atherosclerosis.  3 vessel arch. Right carotid system: Brachiocephalic artery and right carotid atherosclerosis. No significant stenosis to the skull base. Left  carotid system: Left CCA soft and calcified plaque including low-density plaque in the mid to distal left CCA. Soft and calcified plaque at the left ICA origin and bulb with less than 50 % stenosis with respect to the distal vessel. More advanced plaque and stenosis just below the skull base, estimated at 60%. See intracranial findings below. Vertebral arteries: Proximal subclavian artery atherosclerosis is stable. Left vertebral artery arises early with atherosclerosis at its origin, stable. Both vertebral arteries remain patent to the skull base, fairly codominant. CTA HEAD Posterior circulation: Diminutive vertebrobasilar system proximally due to persistent left side trigeminal artery. Diminutive distal vertebral arteries, PICA origins, diminutive proximal basilar artery appear stable and patent. Persistent trigeminal artery, see PCA details below. Anterior circulation: Right ICA siphon has been revascularized since 06/11/2023 but with severe left siphon calcified atherosclerosis and superimposed patent vascular stent. Right ICA terminus is patent. Left ICA siphon also highly atherosclerotic, and is newly occluded in the cavernous segment on series 14, image 135. A persistent left side trigeminal artery remains patent just proximal to the occlusion, with calcified plaque at its origin. No reconstitution of the supraclinoid left ICA. The left MCA and ACA origins are being reconstituted likely in part from the anterior communicating artery. Distal basilar artery supplied via persistent trigeminal, and contralateral fetal type right PCA origin. Bilateral PCA branches are within normal limits. Left MCA M1 segment and bifurcation remain patent without stenosis. Anterior communicating artery, bilateral ACA branches are stable and within normal limits. Right MCA M1 segment and bifurcation are patent without stenosis. Bilateral MCA branches are stable.  No branch occlusion. Venous sinuses: Early contrast timing, grossly  patent. Anatomic variants: Persistent left side trigeminal artery which supplies the distal basilar. Superimposed fetal type right PCA origin. Review of the MIP images confirms the above findings IMPRESSION: 1. 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 This was discussed by telephone with Dr. LAMAR PRICE on 10/04/2023 at 10:46 . 2. No other intracranial large vessel or circle-of-Willis branch occlusion identified. Patent distal Right ICA stent. Advanced atherosclerosis throughout the head and neck. * No acute intracranial abnormality by plain CT. Chronic posterior right MCA encephalomalacia. *Emphysema (ICD10-J43.9), with abnormal left upper lung masslike opacity. PET-CT was recommended in February. * Aortic Atherosclerosis (ICD10-I70.0). Electronically Signed   By: VEAR Hurst M.D.   On: 10/04/2023 10:56   CARDIAC EVENT MONITOR Result Date: 09/16/2023 Sinus rhythm 212,608 premature ventricular contractions     PHYSICAL EXAM  Temp:  [97.5 F (36.4 C)-98 F (36.7 C)] 98 F (36.7 C) (06/21 1229) Pulse Rate:  [67-88] 88 (06/21 1229) Resp:  [15-19] 18 (06/21 1229) BP: (136-173)/(73-102) 173/89 (06/21 1229) SpO2:  [93 %-98 %] 97 % (06/21 1229)  General - Well nourished, well developed, in no apparent distress.  Ophthalmologic - fundi not visualized due to noncooperation.  Cardiovascular - Regular rhythm and rate.  Mental Status -  Level of arousal and orientation to time, place, and person were intact. Language including expression, naming, repetition, comprehension was assessed and found intact. Attention span and concentration were normal. Fund of Knowledge was assessed and was intact.  Cranial Nerves II - XII - II - Visual field intact OU. III, IV, VI - Extraocular movements intact. V - Facial sensation  intact bilaterally. VII - Facial movement intact bilaterally. VIII - Hearing & vestibular intact bilaterally. X - Palate elevates symmetrically. XI - Chin turning & shoulder shrug intact bilaterally. XII - Tongue protrusion intact.  Motor Strength - The patient's strength was normal in all extremities and pronator drift was absent.  Bulk was normal and fasciculations were absent.   Motor Tone - Muscle tone was assessed at the neck and appendages and was normal.  Reflexes - The patient's reflexes were symmetrical in all extremities and he had no pathological reflexes.  Sensory - Light touch, temperature/pinprick were assessed and were symmetrical.    Coordination - The patient had normal movements in the hands and feet with no ataxia or dysmetria.  Tremor was absent.  Gait and Station - deferred.   ASSESSMENT/PLAN Mr. Henry Boone is a 58 y.o. male with history of HTN, HLD, smoker, stroke admitted for transient R eye blurry vision. No TNK given due to symptoms resolved.    Stroke:  right temporal and MCA/ACA small infarcts, likely secondary to large vessel disease source CT no acute abnormality CTA head and neck new left ICA siphon occlusion, right ICA stent patent MRI punctate right temporal and right MCA/ACA infarcts 2D Echo EF 55 to 60% in 05/2023 LDL 98 HgbA1c 5.8 UDS negative SCDs for VTE prophylaxis aspirin  81 mg daily prior to admission, now on aspirin  81 mg daily and Brilinta  (ticagrelor ) 90 mg bid DAPT for another 3 months and then aspirin  alone. Patient counseled to be compliant with his antithrombotic medications Ongoing aggressive stroke risk factor management Therapy recommendations: None Disposition: Home likely today  History of stroke Per patient, had right  central vision loss in 2016, now still has small right central vision loss as a residual Per patient, left-sided weakness in 2019, presumed to be a stroke on the right 05/2023 admitted for right MCA scattered  infarcts.  CT head and neck showed right ICA occlusion with distal reconstitution, left ICA 50% stenosis, right VA origin severe stenosis.  CTP 15/231 cc.  Stat post IR with TICI2c and right ICA stenting.  EF 55 to 60%.  LDL 68, A1c 5.7, discharged on aspirin  and Brilinta .  Hypertension Stable on the high and Okay to resume home amlodipine  5 on discharge Avoid low BP Long term BP goal normotensive  Hyperlipidemia Home meds: Pravastatin  40 LDL 98, goal < 70 Now on Crestor  40 Continue statin at discharge  Tobacco abuse Current smoker Smoking cessation counseling provided Pt is willing to quit  Other Stroke Risk Factors   Other Active Problems   Hospital day # 1  Neurology will sign off. Please call with questions. Pt will follow up with stroke clinic NP at [facility] in about 4 weeks. Thanks for the consult.   Ary Cummins, MD PhD Stroke Neurology 10/05/2023 10:46 PM    To contact Stroke Continuity provider, please refer to [email or web address]. After hours, contact General Neurology

## 2023-10-08 ENCOUNTER — Encounter

## 2023-10-08 ENCOUNTER — Ambulatory Visit: Admitting: Physical Therapy

## 2023-10-10 ENCOUNTER — Encounter

## 2023-10-10 ENCOUNTER — Ambulatory Visit: Admitting: Physical Therapy

## 2023-10-15 ENCOUNTER — Encounter

## 2023-10-15 ENCOUNTER — Ambulatory Visit: Admitting: Physical Therapy

## 2023-10-17 ENCOUNTER — Encounter

## 2023-10-17 ENCOUNTER — Ambulatory Visit: Admitting: Physical Therapy

## 2023-10-22 ENCOUNTER — Encounter: Payer: Self-pay | Admitting: Family Medicine

## 2023-10-22 ENCOUNTER — Encounter

## 2023-10-22 ENCOUNTER — Ambulatory Visit: Admitting: Physical Therapy

## 2023-10-24 ENCOUNTER — Encounter

## 2023-10-24 ENCOUNTER — Ambulatory Visit: Admitting: Physical Therapy

## 2023-10-29 ENCOUNTER — Ambulatory Visit: Admitting: Physical Therapy

## 2023-10-29 ENCOUNTER — Encounter

## 2023-10-31 ENCOUNTER — Encounter

## 2023-10-31 ENCOUNTER — Ambulatory Visit: Admitting: Physical Therapy

## 2023-12-11 NOTE — Patient Instructions (Incomplete)
 Below is our plan:  Stroke:  right MCA scattered infarcts with right cavernous ICA occlusion s/p IR and stenting with TICI2c, etiology: Large vessel disease: Residual deficit: left sided numbness. Continue aspirin  81 mg daily and Brilinta  (ticagrelor ) 90 mg bid as well as rosuvastatin  and Zetia  per PCP. Discussed secondary stroke prevention measures and importance of close PCP follow up for aggressive stroke risk factor management. I have gone over the pathophysiology of stroke, warning signs and symptoms, risk factors and their management in some detail with instructions to go to the closest emergency room for symptoms of concern. HTN: BP goal <130/90. Elevated today. Hx of white coat syndrome. Home readings <120/80. Continue amlodipine  5mg  daily per PCP.  HLD: LDL goal <70. Recent LDL 68. Continue rosuvastatin  40mg  and Zetia  10mg  daily per PCP.  DMII: A1c goal<7.0. Recent A1c 5.8. Prediabetic. Continue to monitor closely per PCP. Low carb diet and regular exercise advised.  Hx Occluded right ICA and newly diagnosed occlusion left ICA: 05/2023 admitted for right MCA scattered infarcts. CT head and neck showed right ICA occlusion with distal reconstitution, left ICA 50% stenosis, right VA origin severe stenosis. Stat post IR with TICI2c and right ICA stenting. 09/2023, CTA head and neck new left ICA siphon occlusion Will place referral to vascular neurosurgery following departure of Dr Dolphus. Aware to listen for a call from Dr Lester or Dr Catarino office to schedule.  Irregular heart beat: irregular rhythm auscultated on exam,07/2023, cardiac monitor showed frequent PVCs. Cardiology referral for consideration of loop recorder placed but he has not returned calls to schedule. He was advised to contact [facility]. [facility] Phone: [phone], Fax: [phone].  Left lower ext weakness/residual numbness: continue to monitor closely.  Tobacco use: continue cessation.    Goals:  1)  Maintain strict control of hypertension with blood pressure goal below 130/90 2) Maintain good control of diabetes with hemoglobin A1c goal below 7%  3) Maintain good control of lipids with LDL cholesterol goal below 70 mg/dL.  4) Eat a healthy diet with plenty of whole grains, cereals, fruits and vegetables, exercise regularly and maintain ideal body weight   Resources: [email or web address]  Please make sure you are staying well hydrated. I recommend 50-60 ounces daily. Well balanced diet and regular exercise encouraged. Consistent sleep schedule with 6-8 hours recommended.   Please continue follow up with care team as directed.   Follow up with me in 6-8 months   You may receive a survey regarding today's visit. I encourage you to leave honest feed back as I do use this information to improve patient care. Thank you for seeing me today!

## 2023-12-11 NOTE — Progress Notes (Unsigned)
 [facility] [address] [place]. [place] [address] [phone]       HOSPITAL FOLLOW UP NOTE  Henry. Henry Boone Date of Birth:  Jul 21, 1965 Medical Record Number:  [id number]   Reason for Referral:  hospital stroke follow up    SUBJECTIVE:   CHIEF COMPLAINT:  No chief complaint on file.   HPI:   Henry Boone is a 58 y.o. who  has a past medical history of ETOH abuse, History of cerebral artery stenosis, Hyperlipidemia, Hypertension, and Stroke (HCC).  Patient presented on 06/11/2023 for new onset left sided weakness and numbness. CT showed age-indeterminate tiny anterior right frontal cortex infarct. CTA head & neck were obtained and revealed emergent large vessel occlusion at the right cervical carotid. CT perfusion revealed ischemic perfusion in the bilateral MCA territory. 15 ml area of infarct reported in the right cerebral hemisphere, chronic high-grade narrowing at the right vertebral origin due to calcified plaque.  He underwent interventional radiology for clued right ICA with TICI 2C revascularization, with 2 areas of arterial sclerosis related stenosis and revascularization with stent assisted angioplasty, middle cerebral artery revascularization of TICI 2C.  He was then admitted to the neuro ICU for further management.  He was hypertensive requiring Cleviprex  drip for short period of time and then transitioned to p.o. medications.  BP goal 120- 140 sys.  He was restarted on on his home blood pressure medications of amlodipine  5 mg, HCTZ 25 mg, and lisinopril  20 mg however this dropped his blood pressure and HCTZ and lisinopril  were discontinued. Asa and Plavix  prior to admission. Now taking Asa 81mg  QD and Brilinta  BID advised for 3 months. PT/OT recommended outpatient therapy. Personally reviewed hospitalization pertinent progress notes, lab work and imaging. He was discharged home 06/10/2023. Evaluated by Dr Jerri.   Since discharge, he reports doing well. He  continues to have left sided numbness, especially of left occipital area of head and left ear. Also of left lateral leg and foot. He completed 4 sessions of PT/OT. He was discharged and continues HEP.   Asa and Brilinta . He tells me that he ran out of Brilinta  about a week ago. Refills were requested through [facility] and say in process but he has not heard back. He continues pravastatin  and tolerating well.  BP is usually less than 120/80s at home. He continues amlodipine  5g daily. He was seen by PCP office 06/29/2023. BP was stable at that visit.   He reports follow up with Dr Dolphus went well 06/26/2023. He reports not being told how long to continue Brilinta .   Smoking 1-2 cigarettes daily. He is using nicotine  patch but reports not getting refills and using sparingly. He does not exercise but is very active around the home. He enjoys working in the garden.   UPDATE 12/12/2023 ALL: Henry Boone returns for follow up for CVA. He was last seen 08/06/2023. Irregular heart rhythm auscultated on exam. Cardiac monitor ordered and showed frequent PVCs. Cardiology referral placed. Unfortunately, he was readmitted 09/2023 for acute vision changes. MRI showed acute infarct of posterior R temporal, residual changes in posterior right parietal and frontal lobes and chronic and old left frontal, parietal and occipital subcortical infarcts. CTA confirmed new L ICA occlusion since 05/2023. Favor ELVO and note distal basilar supplied via a persistent left trigeminal artery. Pravastatin  switched to rosuvastatin  and Zetia .   Since,   Left sided weakness  Brilinta  Asa  Norvasc .  Rosuvastatin  and Zetia .   Henry Boone   Smoking?  PERTINENT IMAGING/LABS  Code Stroke  CT head  Age-indeterminate tiny anterior right frontal cortex infarct  CTA head & neck Emergent large vessel occlusion at the right cervical carotid with distal reconstitution at ophthalmic artery level, left ICA old P3 segment occlusion, left ICA bulb 50%  stenosis.  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  Right VA origin severe stenosis. CT perfusion 15/231 cc S/p angiogram showed right cavernous ICA occlusion, status post IR and stenting, achieving TICI2c Post IR CT no hemorrhage MRI  Scattered acute infarcts in the right cerebral hemisphere 2D Echo EF 55 to 60%.   A1C Lab Results  Component Value Date   HGBA1C 5.8 (H) 10/05/2023    Lipid Panel     Component Value Date/Time   CHOL 146 10/05/2023 0752   TRIG 60 10/05/2023 0752   HDL 36 (L) 10/05/2023 0752   CHOLHDL 4.1 10/05/2023 0752   VLDL 12 10/05/2023 0752   LDLCALC 98 10/05/2023 0752      ROS:   14 system review of systems performed and negative with exception of those listed in HPI  PMH:  Past Medical History:  Diagnosis Date   ETOH abuse    History of cerebral artery stenosis    Hyperlipidemia    Hypertension    Stroke (HCC)     PSH:  Past Surgical History:  Procedure Laterality Date   COLONOSCOPY WITH PROPOFOL  N/A 01/20/2019   Procedure: COLONOSCOPY WITH PROPOFOL ;  Surgeon: Toledo, Ladell POUR, MD;  Location: [facility];  Service: Gastroenterology;  Laterality: N/A;   IR ANGIO EXTRACRAN SEL COM CAROTID INNOMINATE UNI BILAT MOD SED  07/23/2017   IR ANGIO INTRA EXTRACRAN SEL COM CAROTID INNOMINATE UNI R MOD SED  11/07/2017   IR ANGIO VERTEBRAL SEL SUBCLAVIAN INNOMINATE UNI R MOD SED  11/07/2017   IR ANGIO VERTEBRAL SEL VERTEBRAL BILAT MOD SED  07/23/2017   IR CT HEAD LTD  06/11/2023   IR CT HEAD LTD  06/11/2023   IR INTRA CRAN STENT  06/11/2023   IR PERCUTANEOUS ART THROMBECTOMY/INFUSION INTRACRANIAL INC DIAG ANGIO  06/11/2023   IR RADIOLOGIST EVAL & MGMT  06/28/2023   MICROLARYNGOSCOPY N/A 06/30/2019   Procedure: SUSPENSION MICROLARYNGOSCOPY WITH MICROFLAP;  Surgeon: Milissa Hamming, MD;  Location: [facility] ORS;  Service: ENT;  Laterality: N/A;   RADIOLOGY WITH ANESTHESIA N/A 11/07/2017   Procedure: STENTING;  Surgeon: Dolphus Carrion, MD;  Location: [facility] OR;  Service: Radiology;  Laterality: N/A;   RADIOLOGY WITH ANESTHESIA N/A 06/11/2023   Procedure: IR WITH ANESTHESIA;  Surgeon: Radiologist, Medication, MD;  Location: [facility] OR;  Service: Radiology;  Laterality: N/A;   TEE WITHOUT CARDIOVERSION N/A 11/15/2014   Procedure: TRANSESOPHAGEAL ECHOCARDIOGRAM (TEE);  Surgeon: Evalene JINNY Lunger, MD;  Location: [facility] ORS;  Service: Cardiovascular;  Laterality: N/A;   TONSILLECTOMY      Social History:  Social History   Socioeconomic History   Marital status: Single    Spouse name: Not on file   Number of children: Not on file   Years of education: Not on file   Highest education level: Not on file  Occupational History   Not on file  Tobacco Use   Smoking status: Every Day    Current packs/day: 0.50    Types: Cigarettes   Smokeless tobacco: Never   Tobacco comments:    Smokes 1-2 cigarettes per day   Vaping Use   Vaping status: Never Used  Substance and Sexual Activity   Alcohol use:  Not Currently   Drug use: Never   Sexual activity: Yes  Other Topics Concern   Not on file  Social History Narrative   Not on file   Social Drivers of Health   Financial Resource Strain: Low Risk  (04/11/2023)   Received from [facility]   Overall Financial Resource Strain (CARDIA)    Difficulty of Paying Living Expenses: Not hard at all  Food Insecurity: No Food Insecurity (10/05/2023)   Hunger Vital Sign    Worried About Running Out of Food in the Last Year: Never true    Ran Out of Food in the Last Year: Never true  Transportation Needs: No Transportation Needs (10/05/2023)   PRAPARE - Administrator, Civil Service (Medical): No    Lack of Transportation (Non-Medical): No  Physical Activity: Not on file  Stress: Not on file  Social Connections: Not on file  Intimate Partner Violence: Not At Risk (10/05/2023)   Humiliation, Afraid, Rape, and Kick questionnaire    Fear of Current or Ex-Partner:  No    Emotionally Abused: No    Physically Abused: No    Sexually Abused: No    Family History: No family history on file.  Medications:   Current Outpatient Medications on File Prior to Visit  Medication Sig Dispense Refill   amLODipine  (NORVASC ) 5 MG tablet Take 1 tablet (5 mg total) by mouth in the morning.     aspirin  81 MG chewable tablet Chew 1 tablet (81 mg total) by mouth daily. (Patient taking differently: Chew 81 mg by mouth in the morning.) 120 tablet 0   ezetimibe  (ZETIA ) 10 MG tablet Take 1 tablet (10 mg total) by mouth daily. 30 tablet 0   Multiple Vitamin (MULTIVITAMIN WITH MINERALS) TABS tablet Take 1 tablet by mouth daily. (Patient taking differently: Take 1 tablet by mouth in the morning. Gummies) 30 tablet 12   nicotine  (NICODERM CQ  - DOSED IN MG/24 HOURS) 21 mg/24hr patch Place 1 patch (21 mg total) onto the skin daily. 28 patch 0   rosuvastatin  (CRESTOR ) 40 MG tablet Take 1 tablet (40 mg total) by mouth daily. 30 tablet 0   ticagrelor  (BRILINTA ) 90 MG TABS tablet Take 1 tablet (90 mg total) by mouth 2 (two) times daily. 60 tablet 0   No current facility-administered medications on file prior to visit.    Allergies:  No Known Allergies    OBJECTIVE:  Physical Exam  There were no vitals filed for this visit.  There is no height or weight on file to calculate BMI. No results found.      No data to display           General: well developed, well nourished, seated, in no evident distress Head: head normocephalic and atraumatic.   Neck: supple with no carotid or supraclavicular bruits Cardiovascular: regular rate, irregular rhythm auscultated on exam, today, no murmurs Musculoskeletal: no deformity Skin:  no rash/petichiae Vascular:  Normal pulses all extremities   Neurologic Exam Mental Status: Awake and fully alert.  Fluent speech and language.  Oriented to place and time. Recent and remote memory intact. Attention span, concentration and fund of  knowledge appropriate. Mood and affect appropriate.  Cranial Nerves: Fundoscopic exam reveals sharp disc margins. Pupils equal, briskly reactive to light. Extraocular movements full without nystagmus. Visual fields full to confrontation. Hearing intact. Facial sensation intact. Face, tongue, palate moves normally and symmetrically.  Motor: Normal bulk and tone. Normal strength in all tested  extremity muscles Sensory.: intact to touch , pinprick , position and vibratory sensation.  Coordination: Rapid alternating movements normal in all extremities. Finger-to-nose and heel-to-shin performed accurately bilaterally. Gait and Station: Arises from chair without difficulty. Stance is normal. Gait demonstrates normal stride length and balance with no assistive device.  Reflexes: 1+ and symmetric.    NIHSS  0 Modified Rankin  0    ASSESSMENT: Henry Boone is a 58 y.o. year old male presenting 06/11/2023 for new onset left sided weakness and numbness. Vascular risk factors include HTN, HLD, tobacco use, history of stroke.     PLAN:  Stroke:  right MCA scattered infarcts with right cavernous ICA occlusion s/p IR and stenting with TICI2c, etiology: Large vessel disease: Residual deficit: left sided numbness. Continue aspirin  81 mg daily and Brilinta  (ticagrelor ) 90 mg bid until 09/08/2023 and pravastatin  40mg  daily for secondary stroke prevention. Clarify with Dr Dolphus if he wants you to take Brilinta  longer. Discussed secondary stroke prevention measures and importance of close PCP follow up for aggressive stroke risk factor management. I have gone over the pathophysiology of stroke, warning signs and symptoms, risk factors and their management in some detail with instructions to go to the closest emergency room for symptoms of concern. HTN: BP goal <130/90. Elevated today. Hx of white coat syndrome. Home readings <120/80. Continue amlodipine  5mg  daily per PCP. May consider increased dose pending home  BP readings.  HLD: LDL goal <70. Recent LDL 68. Continue pravastatin  40mg  daily per PCP.  DMII: A1c goal<7.0. Recent A1c 5.7. Prediabetic. Continue to monitor closely per PCP. Low carb diet and regular exercise advised.  Irregular heart beat: irregular rhythm auscultated on exam, today. He reports being told he needed to consider cardiac monitor but was not ordered. I will order a cardiac monitor. Please let me know if you do not hear back to schedule.  Tobacco use: continue cessation efforts. Wean dose of nicotine  per PCP.    Follow up in 6 months    CC:  [facility] provider: Dr. Rosemarie PCP: [name], MD    I spent 45 minutes of face-to-face and non-face-to-face time with patient.  This included previsit chart review including review of recent hospitalization, lab review, study review, order entry, electronic health record documentation, patient education regarding recent stroke including etiology, secondary stroke prevention measures and importance of managing stroke risk factors, residual deficits and typical recovery time and answered all other questions to patient satisfaction   Greig Forbes, [facility]  [facility] [address] [place], [place] [address]  Phone [phone] Fax [phone] Note: This document was prepared with digital dictation and possible smart phrase technology. Any transcriptional errors that result from this process are unintentional.

## 2023-12-12 ENCOUNTER — Encounter: Payer: Self-pay | Admitting: Family Medicine

## 2023-12-12 ENCOUNTER — Ambulatory Visit: Admitting: Family Medicine

## 2023-12-12 ENCOUNTER — Telehealth: Payer: Self-pay | Admitting: Family Medicine

## 2023-12-12 VITALS — BP 132/89 | HR 56 | Ht 72.0 in | Wt 140.0 lb

## 2023-12-12 DIAGNOSIS — I6522 Occlusion and stenosis of left carotid artery: Secondary | ICD-10-CM

## 2023-12-12 DIAGNOSIS — F17200 Nicotine dependence, unspecified, uncomplicated: Secondary | ICD-10-CM

## 2023-12-12 DIAGNOSIS — I493 Ventricular premature depolarization: Secondary | ICD-10-CM | POA: Diagnosis not present

## 2023-12-12 DIAGNOSIS — I10 Essential (primary) hypertension: Secondary | ICD-10-CM

## 2023-12-12 DIAGNOSIS — I639 Cerebral infarction, unspecified: Secondary | ICD-10-CM

## 2023-12-12 DIAGNOSIS — I6521 Occlusion and stenosis of right carotid artery: Secondary | ICD-10-CM

## 2023-12-12 DIAGNOSIS — E785 Hyperlipidemia, unspecified: Secondary | ICD-10-CM

## 2023-12-12 NOTE — Telephone Encounter (Signed)
 Referral For Neurosurgery sent thru epic to [facility] Neurosurgery at [facility] Neurosurgery at [facility] Phone: [phone]

## 2024-01-21 ENCOUNTER — Telehealth: Payer: Self-pay | Admitting: Family Medicine

## 2024-01-21 NOTE — Telephone Encounter (Signed)
 MYC cxl

## 2024-01-28 ENCOUNTER — Encounter: Payer: Self-pay | Admitting: Family Medicine

## 2024-02-12 NOTE — Telephone Encounter (Signed)
 Phone room: please call pt to find out if he was able to make appt with Neurosurgery and Cardiology. If not, he can call Neurosurgery at [phone] to schedule and Cardiology at [phone] to schedule.

## 2024-02-24 ENCOUNTER — Emergency Department
Admission: EM | Admit: 2024-02-24 | Discharge: 2024-02-24 | Disposition: A | Discharge status: Home / Self Care | Attending: Emergency Medicine | Admitting: Emergency Medicine

## 2024-02-24 ENCOUNTER — Emergency Department

## 2024-02-24 ENCOUNTER — Telehealth: Payer: Self-pay | Admitting: Neuroradiology

## 2024-02-24 DIAGNOSIS — H538 Other visual disturbances: Secondary | ICD-10-CM | POA: Diagnosis present

## 2024-02-24 DIAGNOSIS — R202 Paresthesia of skin: Secondary | ICD-10-CM | POA: Diagnosis not present

## 2024-02-24 DIAGNOSIS — Z8673 Personal history of transient ischemic attack (TIA), and cerebral infarction without residual deficits: Secondary | ICD-10-CM | POA: Insufficient documentation

## 2024-02-24 DIAGNOSIS — R42 Dizziness and giddiness: Secondary | ICD-10-CM | POA: Insufficient documentation

## 2024-02-24 DIAGNOSIS — Z7901 Long term (current) use of anticoagulants: Secondary | ICD-10-CM | POA: Insufficient documentation

## 2024-02-24 DIAGNOSIS — I10 Essential (primary) hypertension: Secondary | ICD-10-CM | POA: Diagnosis not present

## 2024-02-24 DIAGNOSIS — I2510 Atherosclerotic heart disease of native coronary artery without angina pectoris: Secondary | ICD-10-CM | POA: Insufficient documentation

## 2024-02-24 DIAGNOSIS — F17200 Nicotine dependence, unspecified, uncomplicated: Secondary | ICD-10-CM | POA: Insufficient documentation

## 2024-02-24 DIAGNOSIS — R918 Other nonspecific abnormal finding of lung field: Secondary | ICD-10-CM | POA: Diagnosis not present

## 2024-02-24 LAB — COMPREHENSIVE METABOLIC PANEL WITH GFR
ALT: 20 U/L (ref 0–44)
AST: 21 U/L (ref 15–41)
Albumin: 4 g/dL (ref 3.5–5.0)
Alkaline Phosphatase: 70 U/L (ref 38–126)
Anion gap: 11 (ref 5–15)
BUN: 8 mg/dL (ref 6–20)
CO2: 24 mmol/L (ref 22–32)
Calcium: 9.2 mg/dL (ref 8.9–10.3)
Chloride: 104 mmol/L (ref 98–111)
Creatinine, Ser: 0.81 mg/dL (ref 0.61–1.24)
GFR, Estimated: >60 mL/min (ref >60)
Glucose, Bld: 133 mg/dL — ABNORMAL HIGH (ref 70–99)
Potassium: 3.7 mmol/L (ref 3.5–5.1)
Sodium: 139 mmol/L (ref 135–145)
Total Bilirubin: 0.9 mg/dL (ref 0.0–1.2)
Total Protein: 7.3 g/dL (ref 6.5–8.1)

## 2024-02-24 LAB — ETHANOL: Alcohol, Ethyl (B): <15 mg/dL (ref <15)

## 2024-02-24 LAB — URINALYSIS, ROUTINE W REFLEX MICROSCOPIC
Bilirubin Urine: NEGATIVE
Glucose, UA: NEGATIVE mg/dL
Hgb urine dipstick: NEGATIVE
Ketones, ur: NEGATIVE mg/dL
Leukocytes,Ua: NEGATIVE
Nitrite: NEGATIVE
Protein, ur: NEGATIVE mg/dL
Specific Gravity, Urine: 1.004 — ABNORMAL LOW (ref 1.005–1.030)
pH: 7 (ref 5.0–8.0)

## 2024-02-24 LAB — PROTIME-INR
INR: 1 (ref 0.8–1.2)
Prothrombin Time: 14.2 s (ref 11.4–15.2)

## 2024-02-24 LAB — RESP PANEL BY RT-PCR (RSV, FLU A&B, COVID)  RVPGX2
Influenza A by PCR: NEGATIVE
Influenza B by PCR: NEGATIVE
Resp Syncytial Virus by PCR: NEGATIVE
SARS Coronavirus 2 by RT PCR: NEGATIVE

## 2024-02-24 LAB — DIFFERENTIAL
Abs Immature Granulocytes: 0.04 K/uL (ref 0.00–0.07)
Basophils Absolute: 0.1 K/uL (ref 0.0–0.1)
Basophils Relative: 1 %
Eosinophils Absolute: 0.1 K/uL (ref 0.0–0.5)
Eosinophils Relative: 2 %
Immature Granulocytes: 0 %
Lymphocytes Relative: 16 %
Lymphs Abs: 1.6 K/uL (ref 0.7–4.0)
Monocytes Absolute: 0.6 K/uL (ref 0.1–1.0)
Monocytes Relative: 6 %
Neutro Abs: 7.2 K/uL (ref 1.7–7.7)
Neutrophils Relative %: 75 %

## 2024-02-24 LAB — CBC
HCT: 48.3 % (ref 39.0–52.0)
Hemoglobin: 16.1 g/dL (ref 13.0–17.0)
MCH: 28.9 pg (ref 26.0–34.0)
MCHC: 33.3 g/dL (ref 30.0–36.0)
MCV: 86.6 fL (ref 80.0–100.0)
Platelets: 344 K/uL (ref 150–400)
RBC: 5.58 MIL/uL (ref 4.22–5.81)
RDW: 12.7 % (ref 11.5–15.5)
WBC: 9.6 K/uL (ref 4.0–10.5)
nRBC: 0 % (ref 0.0–0.2)

## 2024-02-24 LAB — APTT: aPTT: 31 s (ref 24–36)

## 2024-02-24 LAB — TROPONIN I (HIGH SENSITIVITY)
Troponin I (High Sensitivity): 8 ng/L (ref <18)
Troponin I (High Sensitivity): 7 ng/L (ref <18)

## 2024-02-24 MED ORDER — IOHEXOL 350 MG/ML SOLN
75.0000 mL | Freq: Once | INTRAVENOUS | Status: AC | PRN
  Administered 2024-02-24: 75 mL via INTRAVENOUS

## 2024-02-24 NOTE — Discharge Instructions (Addendum)
 Continue taking your aspirin  and your blood thinner Brilinta .  This will most likely need to be long-term given your stenosis on CT angio.  Someone from interventional radiology should call you to make a follow-up appointment for your CT angio but no indication for admission given no active stroke today however if you develop a change in symptoms worsening symptoms and you should return to the ER for repeat evaluation.  We discussed seen an eye doctor today but you declined that you should call them to make a follow-up appointment.  We have also placed a referral for oncology due to concerns for a lung mass that is slowly growing and this will need to be evaluated for potential cancer.  IMPRESSION: 1. 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 Contralateral high-grade RADIOGRAPHIC STRING SIGN stenosis of the distal right ICA which remains patent. 2. Advanced emphysema with a slowly enlarging left upper lobe lung nodule now up to 2.1 cm; This should be considered bronchogenic Carcinoma until proven otherwise. 3. No hemodynamically significant extracranial atherosclerosis. 4. No acute intracranial abnormality.  Chronic right hemisphere infarcts.

## 2024-02-24 NOTE — ED Notes (Signed)
 CCMD called to monitor patient

## 2024-02-24 NOTE — Plan of Care (Signed)
 Case was discussed with me over the phone. Patient coming in for complaints of intermittent dizziness blurred vision.  MRI brain negative for stroke.  Has a history of prior stroke due to right carotid stenosis that required stent assisted angioplasty.  Has chronic left ICA occlusion.  Essentially all of the brain is being supplied by the right carotid which is severely stenosed and stented. Case was discussed with the interventionalist who recommends no intervention at this time and recommends that the patient continue DAPT for now and follow-up with neuro IR.  He will be receiving a call from Dr. Kim office for follow-up. Avoid hypotension at all times.  Incidentally, he has a left lung mass which has been slowly growing-needs malignancy workup.  This was discussed with Dr. Hobert Eligio Lav, MD Neurology On-call neurologist

## 2024-02-24 NOTE — ED Notes (Signed)
 Patient to CT.

## 2024-02-24 NOTE — Telephone Encounter (Signed)
 Called patient left a voicemail for to call Neurosurgery and Cardiology if have not scheduled appointment.

## 2024-02-24 NOTE — ED Provider Notes (Signed)
 [facility] Provider Note    Event Date/Time   First MD Initiated Contact with Patient 02/24/24 [phone]     (approximate)   History   Eye Problem, Dizziness, and Hypertension   HPI  Henry Boone is a 58 y.o. male with hypertension, hyperlipidemia, CVA, carotid artery disease who comes in with concerns for dizziness, hypertension, eye problem.  I reviewed note where patient was admitted on 10/04/2023.  Patient was admitted for blurred vision in his right eye that then went black and then went back to blurry.  This was positive for stroke with MRI.  It was thought to be secondary to noncompliance with his Brilinta  as well as the fact that he continues to smoke.  CT angio on 10/04/2023 showed new left ICA occlusion  Patient reports that he has been feeling unwell since Saturday.  He reports having a little bit of congestion and just feeling like he might be ill and he did take some Mucinex.  He does report when sometimes he takes Mucinex it makes him feel very sleepy.  He reports that he went to bed at 8:30 PM and just felt a little off.  He reports some intermittent worsening tingling on his left side and he has some baseline left sided tingling from prior strokes.  He states that he woke up in the morning with a little bit of worsening blurred vision on the right and a little bit of dizziness.  He states that he has not been normal since Saturday.  He reports that he has not had any chest pain or worsening shortness of breath.  He reports his vision right now is okay but will intermittently get a little blurred.  Does report cutting down on smoking to be compliant with his Plavix .  He just felt like something felt a little off like maybe he had had a stroke when he was about to have a stroke and therefore he came into the emergency room for evaluation.   Physical Exam   Triage Vital Signs: ED Triage Vitals  Encounter Vitals Group     BP 02/24/24 0747 (!) 165/113      Girls Systolic BP Percentile --      Girls Diastolic BP Percentile --      Boys Systolic BP Percentile --      Boys Diastolic BP Percentile --      Pulse Rate 02/24/24 0747 (!) 101     Resp 02/24/24 0747 18     Temp 02/24/24 0747 98 F (36.7 C)     Temp Source 02/24/24 0747 Oral     SpO2 02/24/24 0747 98 %     Weight --      Height --      Head Circumference --      Peak Flow --      Pain Score 02/24/24 0748 0     Pain Loc --      Pain Education --      Exclude from Growth Chart --     Most recent vital signs: Vitals:   02/24/24 0747  BP: (!) 165/113  Pulse: (!) 101  Resp: 18  Temp: 98 F (36.7 C)  SpO2: 98%     General: Awake, no distress.  CV:  Good peripheral perfusion.  Resp:  Normal effort.  Abd:  No distention.  Soft and nontender Other:  Sensation slightly decreased on the left which patient reports is baseline.  Cranial nerves are otherwise intact.  Finger-nose intact.  Denies any current issues with vision. Patient is vision is 20/40 in both eyes.  Pupils are reactive.  ED Results / Procedures / Treatments   Labs (all labs ordered are listed, but only abnormal results are displayed) Labs Reviewed  RESP PANEL BY RT-PCR (RSV, FLU A&B, COVID)  RVPGX2  PROTIME-INR  APTT  CBC  DIFFERENTIAL  COMPREHENSIVE METABOLIC PANEL WITH GFR  ETHANOL  URINALYSIS, ROUTINE W REFLEX MICROSCOPIC  CBG MONITORING, ED  TROPONIN I (HIGH SENSITIVITY)  TROPONIN I (HIGH SENSITIVITY)     EKG  My interpretation of EKG:  Normal sinus rhythm 95 without any ST elevation or T wave inversions, PVC  RADIOLOGY I have reviewed the xray personally and interpreted no evidence of any pneumonia   PROCEDURES:  Critical Care performed: No  .1-3 Lead EKG Interpretation  Performed by: Ernest Ronal BRAVO, MD Authorized by: Ernest Ronal BRAVO, MD     Interpretation: normal     ECG rate:  70   ECG rate assessment: normal     Rhythm: sinus rhythm     Ectopy: none     Conduction: normal       MEDICATIONS ORDERED IN ED: Medications  iohexol  (OMNIPAQUE ) 350 MG/ML injection 75 mL (75 mLs Intravenous Contrast Given 02/24/24 1016)     IMPRESSION / MDM / ASSESSMENT AND PLAN / ED COURSE  I reviewed the triage vital signs and the nursing notes.   Patient's presentation is most consistent with acute presentation with potential threat to life or bodily function.   Patient comes in with some intermittent blurry vision and left-sided tingling, dizziness.  Patient out of the window for stroke code as symptoms been ongoing for more than 24 hours but given his concern with vision affected I will get a repeat CT angio as well as MRI to evaluate for any stroke.  He overall looks neuro intact but with his history of recurrent strokes I do feel like an MRI is warranted.  We did discuss the possibility of this just being recrudescence from prior stroke from a viral illness, pneumonia, COVID, flu and will do testing for this as well. Consider glaucoma but pupils are reactive bilaterally this seems less likely and he denies any blurry vision at this time so would be odd to be coming and going.   Troponin was negative.  Coags are normal CBC reassuring ethanol negative  9:54 AM called lab to see what the delay was on patient's blood work they stated about another 10 to 15 minutes.  12:18 PM reevaluated patient updated on results thus far.  Patient reports being very anxious about his symptoms when he starts to feel bad and that he just feels like it is something else going on.  He reports when he had injured his shoulder he immediately thought he was having a heart attack.  Discussed with patient I still would like to do the MRI to ensure no stroke  MRI was negative for acute stroke.  Given his CT angio I did discuss case with Dr. Arora to see if we would need patient to be admitted for TIA's for potential intervention.  He did discuss the case over with a interventional list who just recommended  continuing DAPT therapy and follow patient outpatient, no indication for admission.  We did discuss the left lung mass with patient of the concern this could be cancer and need to follow-up with oncology.  Placed a referral for this.  Patient expressed understanding he is  not hypoxic.  We discussed continuing his DAPT therapy which he is aware of.  He denies any symptoms at this time.  I recommended seeing a ophthalmologist, eye doctor and he can go to the [facility] today but he denies any blurred vision and does not want to go there today and will follow-up outpatient.  The patient is on the cardiac monitor to evaluate for evidence of arrhythmia and/or significant heart rate changes.      FINAL CLINICAL IMPRESSION(S) / ED DIAGNOSES   Final diagnoses:  Tingling  Blurred vision, right eye  Lung mass     Rx / DC Orders   ED Discharge Orders          Ordered    Ambulatory referral to Hematology / Oncology        02/24/24 1447             Note:  This document was prepared using Dragon voice recognition software and may include unintentional dictation errors.   Ernest Ronal BRAVO, MD 02/24/24 [phone]

## 2024-02-24 NOTE — ED Triage Notes (Signed)
 Pt to ED via POV from home. Pt reports Saturday morning started to have intermittent dizziness and blurry vision more in right eye. No fall. Pt is on blood thinner. Pt reports has left sided residual weakness from prior CVA. Pt has stent in carotid.

## 2024-02-24 NOTE — Telephone Encounter (Signed)
 Left VM for patient to call us  back to get schedule to establish care with Dr. Lester. And follow up intracranial right ICA stent and

## 2024-02-26 ENCOUNTER — Inpatient Hospital Stay

## 2024-02-26 ENCOUNTER — Inpatient Hospital Stay: Attending: Internal Medicine | Admitting: Internal Medicine

## 2024-02-26 ENCOUNTER — Encounter: Payer: Self-pay | Admitting: *Deleted

## 2024-02-26 ENCOUNTER — Encounter: Payer: Self-pay | Admitting: Internal Medicine

## 2024-02-26 VITALS — BP 153/98 | HR 102 | Temp 97.4°F | Resp 18 | Ht 72.0 in | Wt 137.4 lb

## 2024-02-26 DIAGNOSIS — Z7982 Long term (current) use of aspirin: Secondary | ICD-10-CM | POA: Insufficient documentation

## 2024-02-26 DIAGNOSIS — F17210 Nicotine dependence, cigarettes, uncomplicated: Secondary | ICD-10-CM | POA: Diagnosis not present

## 2024-02-26 DIAGNOSIS — J449 Chronic obstructive pulmonary disease, unspecified: Secondary | ICD-10-CM | POA: Insufficient documentation

## 2024-02-26 DIAGNOSIS — F1010 Alcohol abuse, uncomplicated: Secondary | ICD-10-CM | POA: Insufficient documentation

## 2024-02-26 DIAGNOSIS — I10 Essential (primary) hypertension: Secondary | ICD-10-CM | POA: Diagnosis not present

## 2024-02-26 DIAGNOSIS — C3412 Malignant neoplasm of upper lobe, left bronchus or lung: Secondary | ICD-10-CM | POA: Diagnosis not present

## 2024-02-26 DIAGNOSIS — Z79899 Other long term (current) drug therapy: Secondary | ICD-10-CM | POA: Insufficient documentation

## 2024-02-26 DIAGNOSIS — Z8673 Personal history of transient ischemic attack (TIA), and cerebral infarction without residual deficits: Secondary | ICD-10-CM | POA: Insufficient documentation

## 2024-02-26 DIAGNOSIS — R911 Solitary pulmonary nodule: Secondary | ICD-10-CM | POA: Insufficient documentation

## 2024-02-26 NOTE — Assessment & Plan Note (Addendum)
#   NOV 2025-incidental.  CTA head and neck - Advanced emphysema with a slowly enlarging left upper lobe lung nodule now up to 2.1 cm; This should be considered bronchogenic Carcinoma until proven otherwise.  Recommend PET scan for further evaluation.   # I reviewed the imaging with the patient-also reviewed the staging as -stage I-pending further workup including PET scan.  Discussed with patient that early stage cancers are treated with surgery, however patient is a poor candidate for any surgery given his ongoing smoking/advanced emphysema noted on imaging.  Consider radiation after evaluation/possible biopsy.  # Will refer the patient to pulmonary for further evaluation of the lung nodule.  Patient understand that he might not be a candidate for biopsy given his stroke/severe emphysema.   # Based on current staging-patient is not recommended any further systemic therapy or immunotherapy.  Also discussed that response to therapy  is assessed by a CT scan approximately 2 to 3 months post radiation.  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  As per patient is not symptomatic.  He also states that he has never been told to have emphysema.     # Hx of stroke [Dr.Heck; [place]]-on Brilinta  plus aspirin .  Last stroke as per patient June 2025-chronic tingling and numbness of the extremities residual from stroke.  # Active smoker: Patient has been trying to quit smoking.  # Thank you, Dr.Funke  for allowing me to participate in the care of your pleasant patient. Please do not hesitate to contact me with questions or concerns in the interim.  # DISPOSITION: # no labs- # referral to Dr. Lanora.Assaker  # PET ASAP # follow up TBD- Dr.B  # I reviewed the blood work- with the patient in detail; also reviewed the imaging independently [as summarized above]; and with the patient in detail.

## 2024-02-26 NOTE — Progress Notes (Signed)
 Met with patient during initial visit with Dr. Rennie. All questions answered during visit. Informed that he will be called with his appt for PET scan and pulmonary consult. Contact info given and instructed to call with any questions or needs. Pt verbalized understanding.

## 2024-02-26 NOTE — Progress Notes (Signed)
 What type cancer and treatment plan?

## 2024-02-26 NOTE — Progress Notes (Signed)
 [facility] CONSULT NOTE  Patient Care Team: [name], MD as PCP - General (Family Medicine) Verdene Gills, RN as Oncology Nurse Navigator Rennie Cindy SAUNDERS, MD as Consulting Physician (Oncology)  CHIEF COMPLAINTS/PURPOSE OF CONSULTATION: lung nodule/lung cancer  Oncology History   No history exists.     HISTORY OF PRESENTING ILLNESS:  Henry Boone 58 y.o.  male with a history of smoking is here for further evaluation and recommendations for lung cancer/lung nodule.   Discussed the use of AI scribe software for clinical note transcription with the patient, who gave verbal consent to proceed.  History of Present Illness   Henry Boone is a 58 year old male with a history of strokes who presents with a lung nodule. He is accompanied by his wife.  He has a history of four strokes, with the most recent occurring in June, during which he experienced dizziness and high blood pressure. Imaging at that time revealed a lung nodule in the left upper lobe measuring approximately 2.1 cm, discovered incidentally during stroke evaluation.  He has a 35-year history of smoking, although he has recently quit with some lapses. He does not use oxygen and reports that no one has previously told him he has COPD or emphysema.  He experiences residual effects from his strokes, including tingling and numbness in his left side and fingertips. He is currently on aspirin  and Brilinta  for stroke prevention.  He reports a generally good appetite but notes weight loss over the past few years, attributing it to not feeling hungry often. He snacks frequently and eats because he knows it's time to eat rather than due to hunger.  He works full-time in pension scheme manager at [employer] and is concerned about managing his work schedule with potential medical treatments.       Review of Systems  Constitutional:  Positive for malaise/fatigue. Negative for chills, diaphoresis, fever and weight loss.   HENT:  Negative for nosebleeds and sore throat.   Eyes:  Negative for double vision.  Respiratory:  Positive for cough and shortness of breath. Negative for hemoptysis, sputum production and wheezing.   Cardiovascular:  Negative for chest pain, palpitations, orthopnea and leg swelling.  Gastrointestinal:  Negative for abdominal pain, blood in stool, constipation, diarrhea, heartburn, melena, nausea and vomiting.  Genitourinary:  Negative for dysuria, frequency and urgency.  Musculoskeletal:  Positive for back pain and joint pain.  Skin: Negative.  Negative for itching and rash.  Neurological:  Negative for dizziness, tingling, focal weakness, weakness and headaches.  Endo/Heme/Allergies:  Does not bruise/bleed easily.  Psychiatric/Behavioral:  Negative for depression. The patient is not nervous/anxious and does not have insomnia.     MEDICAL HISTORY:  Past Medical History:  Diagnosis Date   ETOH abuse    History of cerebral artery stenosis    Hyperlipidemia    Hypertension    Stroke [facility])     SURGICAL HISTORY: Past Surgical History:  Procedure Laterality Date   COLONOSCOPY WITH PROPOFOL  N/A 01/20/2019   Procedure: COLONOSCOPY WITH PROPOFOL ;  Surgeon: Toledo, Ladell POUR, MD;  Location: [facility] ENDOSCOPY;  Service: Gastroenterology;  Laterality: N/A;   IR ANGIO EXTRACRAN SEL COM CAROTID INNOMINATE UNI BILAT MOD SED  07/23/2017   IR ANGIO INTRA EXTRACRAN SEL COM CAROTID INNOMINATE UNI R MOD SED  11/07/2017   IR ANGIO VERTEBRAL SEL SUBCLAVIAN INNOMINATE UNI R MOD SED  11/07/2017   IR ANGIO VERTEBRAL SEL VERTEBRAL BILAT MOD SED  07/23/2017   IR CT HEAD  LTD  06/11/2023   IR CT HEAD LTD  06/11/2023   IR INTRA CRAN STENT  06/11/2023   IR PERCUTANEOUS ART THROMBECTOMY/INFUSION INTRACRANIAL INC DIAG ANGIO  06/11/2023   IR RADIOLOGIST EVAL & MGMT  06/28/2023   MICROLARYNGOSCOPY N/A 06/30/2019   Procedure: SUSPENSION MICROLARYNGOSCOPY WITH MICROFLAP;  Surgeon: Milissa Hamming, MD;   Location: [facility] ORS;  Service: ENT;  Laterality: N/A;   RADIOLOGY WITH ANESTHESIA N/A 11/07/2017   Procedure: STENTING;  Surgeon: Dolphus Carrion, MD;  Location: [facility] OR;  Service: Radiology;  Laterality: N/A;   RADIOLOGY WITH ANESTHESIA N/A 06/11/2023   Procedure: IR WITH ANESTHESIA;  Surgeon: Radiologist, Medication, MD;  Location: [facility] OR;  Service: Radiology;  Laterality: N/A;   TEE WITHOUT CARDIOVERSION N/A 11/15/2014   Procedure: TRANSESOPHAGEAL ECHOCARDIOGRAM (TEE);  Surgeon: Evalene JINNY Lunger, MD;  Location: [facility] ORS;  Service: Cardiovascular;  Laterality: N/A;   TONSILLECTOMY      SOCIAL HISTORY: Social History   Socioeconomic History   Marital status: Single    Spouse name: Not on file   Number of children: Not on file   Years of education: Not on file   Highest education level: Not on file  Occupational History   Not on file  Tobacco Use   Smoking status: Former    Current packs/day: 0.00    Types: Cigarettes    Quit date: 10/10/2023    Years since quitting: 0.3   Smokeless tobacco: Never   Tobacco comments:    Smokes 1-2 cigarettes per day   Vaping Use   Vaping status: Never Used  Substance and Sexual Activity   Alcohol use: Not Currently   Drug use: Never   Sexual activity: Yes  Other Topics Concern   Not on file  Social History Narrative   Not on file   Social Drivers of Health   Financial Resource Strain: Low Risk  (04/11/2023)   Received from [facility]   Overall Financial Resource Strain (CARDIA)    Difficulty of Paying Living Expenses: Not hard at all  Food Insecurity: No Food Insecurity (02/26/2024)   Hunger Vital Sign    Worried About Running Out of Food in the Last Year: Never true    Ran Out of Food in the Last Year: Never true  Transportation Needs: No Transportation Needs (02/26/2024)   PRAPARE - Administrator, Civil Service (Medical): No    Lack of Transportation (Non-Medical): No  Physical Activity: Not on  file  Stress: Not on file  Social Connections: Not on file  Intimate Partner Violence: Not At Risk (02/26/2024)   Humiliation, Afraid, Rape, and Kick questionnaire    Fear of Current or Ex-Partner: No    Emotionally Abused: No    Physically Abused: No    Sexually Abused: No    FAMILY HISTORY: Family History  Problem Relation Age of Onset   Breast cancer Mother    Breast cancer Sister     ALLERGIES:  has no known allergies.  MEDICATIONS:  Current Outpatient Medications  Medication Sig Dispense Refill   amLODipine  (NORVASC ) 5 MG tablet Take 1 tablet (5 mg total) by mouth in the morning.     aspirin  81 MG chewable tablet Chew 1 tablet (81 mg total) by mouth daily. 120 tablet 0   ezetimibe  (ZETIA ) 10 MG tablet Take 1 tablet (10 mg total) by mouth daily. 30 tablet 0   Multiple Vitamin (MULTIVITAMIN WITH MINERALS) TABS tablet Take 1 tablet by mouth daily. (Patient  taking differently: Take 1 tablet by mouth daily. Gummies) 30 tablet 12   rosuvastatin  (CRESTOR ) 40 MG tablet Take 1 tablet (40 mg total) by mouth daily. 30 tablet 0   ticagrelor  (BRILINTA ) 90 MG TABS tablet Take 1 tablet (90 mg total) by mouth 2 (two) times daily. 60 tablet 0   nicotine  (NICODERM CQ  - DOSED IN MG/24 HOURS) 21 mg/24hr patch Place 1 patch (21 mg total) onto the skin daily. (Patient not taking: Reported on 02/26/2024) 28 patch 0   No current facility-administered medications for this visit.    PHYSICAL EXAMINATION:  Vitals:   02/26/24 1411  BP: (!) 153/98  Pulse: (!) 102  Resp: 18  Temp: (!) 97.4 F (36.3 C)  SpO2: 98%   Filed Weights   02/26/24 1411  Weight: 137 lb 6.4 oz (62.3 kg)    Physical Exam Vitals and nursing note reviewed.  HENT:     Head: Normocephalic and atraumatic.     Mouth/Throat:     Pharynx: Oropharynx is clear.  Eyes:     Extraocular Movements: Extraocular movements intact.     Pupils: Pupils are equal, round, and reactive to light.  Cardiovascular:     Rate and Rhythm:  Normal rate and regular rhythm.  Pulmonary:     Comments: Decreased breath sounds bilaterally.  Abdominal:     Palpations: Abdomen is soft.  Musculoskeletal:        General: Normal range of motion.     Cervical back: Normal range of motion.  Skin:    General: Skin is warm.  Neurological:     General: No focal deficit present.     Mental Status: He is alert and oriented to person, place, and time.  Psychiatric:        Behavior: Behavior normal.        Judgment: Judgment normal.     LABORATORY DATA:  I have reviewed the data as listed Lab Results  Component Value Date   WBC 9.6 02/24/2024   HGB 16.1 02/24/2024   HCT 48.3 02/24/2024   MCV 86.6 02/24/2024   PLT 344 02/24/2024   Recent Labs    10/04/23 0923 10/05/23 0752 02/24/24 0810  NA 137 138 139  K 4.0 3.7 3.7  CL 103 104 104  CO2 24 29 24   GLUCOSE 139* 107* 133*  BUN 11 8 8   CREATININE 0.68 0.91 0.81  CALCIUM  9.3 9.3 9.2  GFRNONAA >60 >60 >60  PROT 6.7 6.4* 7.3  ALBUMIN  4.0 3.7 4.0  AST 16 15 21   ALT 13 14 20   ALKPHOS 66 63 70  BILITOT 0.6 0.6 0.9    RADIOGRAPHIC STUDIES: I have personally reviewed the radiological images as listed and agreed with the findings in the report. MR BRAIN WO CONTRAST Result Date: 02/24/2024 EXAM: MRI BRAIN WITHOUT CONTRAST 02/24/2024 12:50:00 PM TECHNIQUE: Multiplanar multisequence MRI of the head/brain was performed without the administration of intravenous contrast. COMPARISON: CTA head and neck 02/24/2024. MRI head 10/04/2023. CLINICAL HISTORY: Mental status change, unknown cause. Intermittent dizziness and blurry vision. FINDINGS: BRAIN AND VENTRICLES: No acute infarct, mass, midline shift, hydrocephalus, or extra axial fluid collection is identified. A large chronic posterior right MCA infarct is again noted with a small amount of associated chronic blood products. There are also unchanged small chronic infarcts in the left cerebral hemisphere involving the frontal, parietal,  and occipital lobes as well as insula. Abnormal appearance of the distal left ICA was more fully evaluated on todays CTA. ORBITS:  No acute abnormality. SINUSES AND MASTOIDS: No acute abnormality. BONES AND SOFT TISSUES: Normal marrow signal. No acute soft tissue abnormality. IMPRESSION: 1. No acute intracranial abnormality. 2. Chronic bilateral cerebral infarcts as above. Electronically signed by: Dasie Hamburg MD 02/24/2024 01:39 PM EST RP Workstation: [id number]   CT ANGIO HEAD NECK W WO CM Result Date: 02/24/2024 EXAM: CTA Head and Neck with Intravenous Contrast. CT Head without Contrast. CLINICAL HISTORY: 58 year old male. Neuro deficit, acute, stroke suspected. TECHNIQUE: Axial CTA images of the head and neck performed with intravenous contrast. MIP reconstructed images were created and reviewed. Axial computed tomography images of the head/brain performed without intravenous contrast. Note: Per PQRS, the description of internal carotid artery percent stenosis, including 0 percent or normal exam, is based on North American Symptomatic Carotid Endarterectomy Trial (NASCET) criteria. Dose reduction technique was used including one or more of the following: automated exposure control, adjustment of mA and kV according to patient size, and/or iterative reconstruction. CONTRAST: Without and with; 75mL (iohexol  (OMNIPAQUE ) 350 MG/ML injection 75 mL IOHEXOL  350 MG/ML SOLN) COMPARISON: Brain MRI and CTA head and neck 10/04/2023. FINDINGS: CT HEAD: BRAIN: No acute intraparenchymal hemorrhage. No mass lesion. No CT evidence for acute territorial infarct. No midline shift or extra-axial collection. Chronic infarct and encephalomalacia posterior right MCA territory, stable. Stable underlying brain volume. Stable gray white differentiation elsewhere. Small area of chronic cortical or subcortical encephalomalacia anterior right middle frontal gyrus. VENTRICLES: No hydrocephalus. ORBITS: The orbits are unremarkable. SINUSES  AND MASTOIDS: Visible paranasal sinuses, middle ears and mastoids remain well aerated. CTA NECK: COMMON CAROTID ARTERIES: Widespread low density soft plaque along the course of the left CCA. No significant stenosis. No dissection or occlusion. INTERNAL CAROTID ARTERIES: Cervical right carotid including soft and calcified plaque at the proximal right ICA but no significant stenosis. More advanced cervical left carotid atherosclerosis, including at the left ICA bulb. Complicated soft and calcified plaque in the proximal left ICA. Maximal stenosis at the distal bulb is less than 50 percent. No dissection or occlusion. VERTEBRAL ARTERIES: Calcified plaque at the right vertebral artery origin with only mild stenosis. Right vertebral artery is patent and stable to the vertebrobasilar junction which is diminutive. Patent right PICA origin. Early origin of the left vertebral artery on series 10 image 360. Mild left vertebral origin stenosis. Diminutive left vertebral artery is stable since June, patent to the skull base and functionally terminates in PICA as before. No dissection or occlusion. CTA HEAD: ANTERIOR CEREBRAL ARTERIES: Both ACA origins are mildly irregular. Normal anterior communicating artery. No significant stenosis. No occlusion. No aneurysm. MIDDLE CEREBRAL ARTERIES: Normal bilaterally MCA origins. No significant stenosis. No occlusion. No aneurysm. POSTERIOR CEREBRAL ARTERIES: Fetal type right PCA origin also. These are normal anatomic variants. PCA origins remain patent. No significant stenosis. No occlusion. No aneurysm. BASILAR ARTERY: Chronically diminutive proximal basilar artery with a left side persistent trigeminal artery supplying the distal basilar artery. These are normal anatomic variants. SCA origins remain patent. No significant stenosis. No occlusion. No aneurysm. OTHER: Distal left ICA siphon is chronically occluded in the cavernous segment with moderate to severe upstream atherosclerosis in  the petrous left ICA. Reconstituted enhancement at the left ICA terminus appears from Circle of Willis collaterals and stable. Contralateral right ICA siphon is severely atherosclerotic and stenotic with radiographic string sign stenosis in the distal cavernous and supraclinoid segments but remains patent. Normal right posterior communicating artery origin. Major dural venous sinuses are enhancing and patent. SOFT TISSUES: Nonvascular  neck soft tissues appear negative. No acute finding. No masses or lymphadenopathy. BONES: Cervical spine degeneration. No acute osseous abnormality. LUNGS: Moderate to severe upper lung emphysema. There is a new left upper lobe masslike opacity measuring up to 2.1 cm on series 8 image 165. This was present on previous studies but appears slightly increased now (versus series 10 image 166 in June) and should be considered bronchogenic carcinoma until proven otherwise. AORTIC ARCH: Aortic arch not completely included today. Chronic atherosclerotic arch and slightly low line aortic arch configuration better demonstrated previously. IMPRESSION: 1. 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 Contralateral high-grade RADIOGRAPHIC STRING SIGN stenosis of the distal right ICA which remains patent. 2. Advanced emphysema with a slowly enlarging left upper lobe lung nodule now up to 2.1 cm; This should be considered bronchogenic Carcinoma until proven otherwise. 3. No hemodynamically significant extracranial atherosclerosis. 4. No acute intracranial abnormality.  Chronic right hemisphere infarcts. Electronically signed by: Helayne Hurst MD 02/24/2024 11:02 AM EST RP Workstation: [id number]   DG Chest Portable 1 View Result Date: 02/24/2024 EXAM: 1 VIEW(S) XRAY OF THE CHEST  02/24/2024 08:45:36 AM COMPARISON: Chest CT 06/11/2023. CLINICAL HISTORY: cough FINDINGS: LUNGS AND PLEURA: Hyperinflated lungs. Chronic emphysema. No focal pulmonary opacity. No pulmonary edema. No pleural effusion. No pneumothorax. HEART AND MEDIASTINUM: No acute abnormality of the cardiac and mediastinal silhouettes. BONES AND SOFT TISSUES: No acute osseous abnormality. IMPRESSION: 1. Emphysema. No superimposed acute cardiopulmonary abnormality. Electronically signed by: Helayne Hurst MD 02/24/2024 08:49 AM EST RP Workstation: [id number]     Nodule of upper lobe of left lung # NOV 2025-incidental.  CTA head and neck - Advanced emphysema with a slowly enlarging left upper lobe lung nodule now up to 2.1 cm; This should be considered bronchogenic Carcinoma until proven otherwise.  Recommend PET scan for further evaluation.   # I reviewed the imaging with the patient-also reviewed the staging as -stage I-pending further workup including PET scan.  Discussed with patient that early stage cancers are treated with surgery, however patient is a poor candidate for any surgery given his ongoing smoking/advanced emphysema noted on imaging.  Consider radiation after evaluation/possible biopsy.  # Will refer the patient to pulmonary for further evaluation of the lung nodule.  Patient understand that he might not be a candidate for biopsy given his stroke/severe emphysema.   # Based on current staging-patient is not recommended any further systemic therapy or immunotherapy.  Also discussed that response to therapy  is assessed by a CT scan approximately 2 to 3 months post radiation.  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  As per patient is not symptomatic.  He also states that he has never been told to have emphysema.     # Hx of stroke [Dr.Heck; [place]]-on Brilinta  plus aspirin .  Last stroke as per patient June 2025-chronic tingling and numbness of the extremities residual from stroke.  # Active smoker: Patient has  been trying to quit smoking.  # Thank you, Dr.Funke  for allowing me to participate in the care of your pleasant patient. Please do not hesitate to contact me with questions or concerns in the interim.  # DISPOSITION: # no labs- # referral to Dr. Lanora.Assaker  # PET ASAP # follow up TBD- Dr.B  # I reviewed the blood work- with the patient in detail;  also reviewed the imaging independently [as summarized above]; and with the patient in detail.   Above plan of care was discussed with patient/family in detail.  My contact information was given to the patient/family.     Cindy JONELLE Joe, MD 02/26/2024 3:14 PM

## 2024-03-02 ENCOUNTER — Ambulatory Visit: Admitting: Neuroradiology

## 2024-03-02 ENCOUNTER — Encounter: Payer: Self-pay | Admitting: Neuroradiology

## 2024-03-02 ENCOUNTER — Other Ambulatory Visit

## 2024-03-02 VITALS — BP 130/88 | HR 96 | Ht 72.0 in | Wt 137.0 lb

## 2024-03-02 DIAGNOSIS — Z8673 Personal history of transient ischemic attack (TIA), and cerebral infarction without residual deficits: Secondary | ICD-10-CM | POA: Diagnosis not present

## 2024-03-02 DIAGNOSIS — R002 Palpitations: Secondary | ICD-10-CM

## 2024-03-02 DIAGNOSIS — Z95828 Presence of other vascular implants and grafts: Secondary | ICD-10-CM | POA: Diagnosis not present

## 2024-03-02 DIAGNOSIS — Z87891 Personal history of nicotine dependence: Secondary | ICD-10-CM

## 2024-03-02 DIAGNOSIS — I672 Cerebral atherosclerosis: Secondary | ICD-10-CM | POA: Diagnosis not present

## 2024-03-02 NOTE — Progress Notes (Signed)
 Chief Complaint: Patient was seen in consultation today for  Chief Complaint  Patient presents with   New Patient (Initial Visit)    Pt states that he has been dizzy, and blood pressure was up.    at the request of [facility]  Referring Physician(s): Henry Boone is a 58 y.o. 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 - No routine follow-up unless new symptoms develop. -Ticagrelor  can be stopped as needed for surgical procedures  History of multiple ischemic strokes Multiple ischemic strokes, last in June 2025, attributed to carotid artery disease/severe intracranial atherosclerosis. No new symptoms. - Continue current management with ticagrelor  and aspirin  as per carotid artery disease plan.  Evaluation for pulmonary nodule, possible lung cancer Pulmonary nodule on CT, history of smoking. Oncologist involved, PET scan scheduled. - Emphysema  Tobacco use, current or recent Recent cessation with occasional lapses, 35-year smoking history impacting health. - Continue efforts to abstain from smoking.  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  Palpitations, under evaluation Long-standing palpitations. Cardiologist referral made. - His mechanism of stroke is clearly severe large vessel intracranial atherosclerosis.     Discussed the use of AI scribe software for clinical note transcription with the patient, who gave verbal consent to proceed.  History of Present Illness Henry Boone is a 58 year old male with a history of multiple strokes and carotid artery disease who presents for follow-up  of his vascular condition.  He has a history of multiple strokes, with the first occurring in 2016, resulting in temporary vision loss in his right eye and a small blind spot in his central vision. In 2019, he experienced a stroke that caused loss of sensation on his right side. In January 2025, he had another stroke affecting his left side, which led to a stent placement in his cavernous right carotid artery. In June 2025, he experienced another episode of vision loss in his right eye, similar to the 2016 event.  He underwent a percutaneous cavernous segment right carotid stent and thrombectomy on June 11, 2023, for bilateral carotid occlusions. Follow-up imaging in June 2025 showed no new acute abnormalities, and the right carotid artery remained patent. Further imaging in November 2025 revealed bilateral cervical carotid plaque, a patent but heavily calcified right carotid artery, and an occluded left carotid artery at the cavernous segment.  He experiences dizziness and blurred vision, which prompted the November imaging studies. He feels hypersensitive to changes in his body, attributing this to his history of strokes.  He is currently on ticagrelor  since the stent placement in January 2025, after previously being on Plavix  since 2016. He experienced a lapse in medication due to prescription issues, which coincided with his June 2025 stroke.  He has a significant smoking history, having smoked for 35 years, but quit a few months ago. A recent CT scan revealed a small nodule in his chest, and he is undergoing further evaluation with a PET scan and consultation with a lung specialist.  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 He reports no longer smoking but admits to occasional lapses. He reports feeling that his blood pressure has been elevated since the  stent placement.    Past Medical History:  Diagnosis Date   ETOH abuse    History of cerebral artery  stenosis    Hyperlipidemia    Hypertension    Stroke [facility])     Past Surgical History:  Procedure Laterality Date   COLONOSCOPY WITH PROPOFOL  N/A 01/20/2019   Procedure: COLONOSCOPY WITH PROPOFOL ;  Surgeon: Toledo, Henry POUR, Henry Boone;  Location: [facility] ENDOSCOPY;  Service: Gastroenterology;  Laterality: N/A;   IR ANGIO EXTRACRAN SEL COM CAROTID INNOMINATE UNI BILAT MOD SED  07/23/2017   IR ANGIO INTRA EXTRACRAN SEL COM CAROTID INNOMINATE UNI R MOD SED  11/07/2017   IR ANGIO VERTEBRAL SEL SUBCLAVIAN INNOMINATE UNI R MOD SED  11/07/2017   IR ANGIO VERTEBRAL SEL VERTEBRAL BILAT MOD SED  07/23/2017   IR CT HEAD LTD  06/11/2023   IR CT HEAD LTD  06/11/2023   IR INTRA CRAN STENT  06/11/2023   IR PERCUTANEOUS ART THROMBECTOMY/INFUSION INTRACRANIAL INC DIAG ANGIO  06/11/2023   IR RADIOLOGIST EVAL & MGMT  06/28/2023   MICROLARYNGOSCOPY N/A 06/30/2019   Procedure: SUSPENSION MICROLARYNGOSCOPY WITH MICROFLAP;  Surgeon: Henry Hamming, Henry Boone;  Location: [facility] ORS;  Service: ENT;  Laterality: N/A;   RADIOLOGY WITH ANESTHESIA N/A 11/07/2017   Procedure: STENTING;  Surgeon: Henry Carrion, Henry Boone;  Location: [facility] OR;  Service: Radiology;  Laterality: N/A;   RADIOLOGY WITH ANESTHESIA N/A 06/11/2023   Procedure: IR WITH ANESTHESIA;  Surgeon: Radiologist, Medication, Henry Boone;  Location: [facility] OR;  Service: Radiology;  Laterality: N/A;   TEE WITHOUT CARDIOVERSION N/A 11/15/2014   Procedure: TRANSESOPHAGEAL ECHOCARDIOGRAM (TEE);  Surgeon: Henry JINNY Lunger, Henry Boone;  Location: [facility] ORS;  Service: Cardiovascular;  Laterality: N/A;   TONSILLECTOMY      Allergies: Patient has no known allergies.  Medications: Prior to Admission medications   Medication Sig Start Date End Date Taking? Authorizing Provider  amLODipine  (NORVASC ) 5 MG tablet Take 1 tablet (5 mg total) by mouth in the morning. 10/07/23  Yes Singh, Prashant Boone, Henry Boone  aspirin  81 MG chewable tablet Chew 1 tablet (81 mg total) by mouth daily. 06/14/23  Yes Henry Karna LABOR,  NP  ezetimibe  (ZETIA ) 10 MG tablet Take 1 tablet (10 mg total) by mouth daily. 10/05/23  Yes Singh, Prashant Boone, Henry Boone  Multiple Vitamin (MULTIVITAMIN WITH MINERALS) TABS tablet Take 1 tablet by mouth daily. Patient taking differently: Take 1 tablet by mouth daily. Gummies 11/15/14  Yes Hope Dorothyann SQUIBB, Henry Boone  nicotine  (NICODERM CQ  - DOSED IN MG/24 HOURS) 21 mg/24hr patch Place 1 patch (21 mg total) onto the skin daily. 10/05/23  Yes Dennise Lavada POUR, Henry Boone  rosuvastatin  (CRESTOR ) 40 MG tablet Take 1 tablet (40 mg total) by mouth daily. 10/05/23  Yes Dennise Lavada POUR, Henry Boone  ticagrelor  (BRILINTA ) 90 MG TABS tablet Take 1 tablet (90 mg total) by mouth 2 (two) times daily. 10/05/23  Yes Dennise Lavada POUR, Henry Boone     Family History  Problem Relation Age of Onset   Breast cancer Mother    Breast cancer Sister     Social History   Socioeconomic History   Marital status: Single    Spouse name: Not on file   Number of children: Not on file   Years of education: Not on file   Highest education level: Not on file  Occupational History   Not on file  Tobacco Use   Smoking status: Former    Current packs/day: 0.00    Types: Cigarettes    Quit date: 10/10/2023  Years since quitting: 0.3   Smokeless tobacco: Never   Tobacco comments:    Smokes 1-2 cigarettes per day   Vaping Use   Vaping status: Never Used  Substance and Sexual Activity   Alcohol use: Not Currently   Drug use: Never   Sexual activity: Yes  Other Topics Concern   Not on file  Social History Narrative   Not on file   Social Drivers of Health   Financial Resource Strain: Low Risk  (04/11/2023)   Received from [facility]   Overall Financial Resource Strain (CARDIA)    Difficulty of Paying Living Expenses: Not hard at all  Food Insecurity: No Food Insecurity (02/26/2024)   Hunger Vital Sign    Worried About Running Out of Food in the Last Year: Never true    Ran Out of Food in the Last Year: Never true   Transportation Needs: No Transportation Needs (02/26/2024)   PRAPARE - Administrator, Civil Service (Medical): No    Lack of Transportation (Non-Medical): No  Physical Activity: Not on file  Stress: Not on file  Social Connections: Not on file    Review of Systems  Respiratory:  Positive for shortness of breath. Negative for chest tightness.   Cardiovascular:  Negative for chest pain.    Vital Signs:  BP 130/88   Pulse 96   Ht 6' (1.829 m)   Wt 137 lb (62.1 kg)   SpO2 98%   BMI 18.58 kg/m   Physical Exam NECK: No carotid bruit. CHEST: Clear to auscultation bilaterally. CARDIOVASCULAR: Normal heart rate and rhythm, S1 and S2 normal without murmurs. NEUROLOGICAL: Alert and oriented with normal speech expression, fluency and comprehension. Visual fields are full to confrontation.   Face is symmetric. Strength in the arms and legs is symmetric with no drift.  Sensation is normal and symmetric. No ataxia. No inattention.   Imaging:  Results RADIOLOGY CT arteriogram: Bilateral carotid occlusions at the cavernous level on the left and unclear level on the right. Right acute, left chronic. (06/11/2023) CT arteriogram: No new acute abnormality. Right carotid patent. (09/2023) MR angiogram: No new acute abnormality. (09/2023) Brain MRI: No acute abnormality. (02/24/2024) CT arteriogram: Bilateral cervical carotid plaque without significant stenosis. Right carotid artery patent, heavily calcified and atherosclerotic in the cavernous segment. Left carotid occluded at the cavernous segment. 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 (02/24/2024)  Labs:  CBC: Recent Labs    06/13/23 0527 10/04/23 0923 10/05/23 0752 02/24/24 0810  WBC 8.8 10.1 9.2 9.6  HGB 15.0 14.8 15.7 16.1  HCT 42.1 44.6 46.7 48.3  PLT 259 382 378 344    COAGS: Recent Labs    06/11/23 0746 10/04/23 0923 02/24/24 0810  INR 1.0 1.0 1.0  APTT 31 29 31      BMP: Recent Labs    06/13/23 0527 10/04/23 0923 10/05/23 0752 02/24/24 0810  NA 134* 137 138 139  Boone 3.7 4.0 3.7 3.7  CL 100 103 104 104  CO2 23 24 29 24   GLUCOSE 100* 139* 107* 133*  BUN 12 11 8 8   CALCIUM  9.0 9.3 9.3 9.2  CREATININE 0.92 0.68 0.91 0.81  GFRNONAA >60 >60 >60 >60    LIVER FUNCTION TESTS: Recent Labs    06/11/23 0746 10/04/23 0923 10/05/23 0752 02/24/24 0810  BILITOT 0.5 0.6 0.6 0.9  AST 17 16 15 21   ALT 10 13 14 20   ALKPHOS 60 66 63 70  PROT 7.2 6.7  6.4* 7.3  ALBUMIN  4.2 4.0 3.7 4.0      Electronically Signed: Nancyann LULLA Burns  03/02/2024, 5:29 PM  I spent more than 45 minutes with review of his extensive imaging and medical history, personal review of his imaging, physical examination and consultation with the patient.

## 2024-03-03 ENCOUNTER — Ambulatory Visit: Admitting: Internal Medicine

## 2024-03-03 ENCOUNTER — Encounter: Payer: Self-pay | Admitting: Internal Medicine

## 2024-03-03 ENCOUNTER — Telehealth: Payer: Self-pay

## 2024-03-03 ENCOUNTER — Ambulatory Visit: Payer: Self-pay | Admitting: Internal Medicine

## 2024-03-03 ENCOUNTER — Ambulatory Visit
Admission: RE | Admit: 2024-03-03 | Discharge: 2024-03-03 | Disposition: A | Discharge status: Home / Self Care | Source: Ambulatory Visit | Attending: Internal Medicine | Admitting: Internal Medicine

## 2024-03-03 VITALS — BP 120/80 | HR 93 | Temp 97.9°F | Ht 72.0 in | Wt 139.2 lb

## 2024-03-03 DIAGNOSIS — R911 Solitary pulmonary nodule: Secondary | ICD-10-CM | POA: Insufficient documentation

## 2024-03-03 DIAGNOSIS — R590 Localized enlarged lymph nodes: Secondary | ICD-10-CM | POA: Insufficient documentation

## 2024-03-03 DIAGNOSIS — J439 Emphysema, unspecified: Secondary | ICD-10-CM | POA: Diagnosis not present

## 2024-03-03 DIAGNOSIS — R918 Other nonspecific abnormal finding of lung field: Secondary | ICD-10-CM | POA: Insufficient documentation

## 2024-03-03 DIAGNOSIS — F17210 Nicotine dependence, cigarettes, uncomplicated: Secondary | ICD-10-CM

## 2024-03-03 DIAGNOSIS — Z720 Tobacco use: Secondary | ICD-10-CM

## 2024-03-03 DIAGNOSIS — I700 Atherosclerosis of aorta: Secondary | ICD-10-CM | POA: Diagnosis not present

## 2024-03-03 LAB — GLUCOSE, CAPILLARY: Glucose-Capillary: 117 mg/dL — ABNORMAL HIGH (ref 70–99)

## 2024-03-03 MED ORDER — FLUDEOXYGLUCOSE F - 18 (FDG) INJECTION
7.1000 | Freq: Once | INTRAVENOUS | Status: AC | PRN
  Administered 2024-03-03: 7.46 via INTRAVENOUS

## 2024-03-03 MED ORDER — NICOTINE 21 MG/24HR TD PT24: 21.0000 mg | MEDICATED_PATCH | Freq: Every day | TRANSDERMAL | 0 refills | Status: AC

## 2024-03-03 NOTE — Telephone Encounter (Signed)
 Bronch: Robotic 03/23/24 at 10:00 am Diagnosis: R91.1 CPT: 31627, 31652, 68346  Henry Boone please see info above.

## 2024-03-03 NOTE — H&P (View-Only) (Signed)
 [facility]  Pulmonary Medicine Consultation      Date: 03/03/2024,   MRN# [id number] Henry Boone 11-02-1965     CHIEF COMPLAINT:   Assessment of abnormal CT scan and PET scan   HISTORY OF PRESENT ILLNESS   58 year old pleasant white male seen today for abnormal CT scan and PET scan Patient with a history of strokes and in February 2025 patient had symptoms of a stroke and CT of the chest showed left upper lobe nodule and right upper lobe ground glass opacification  Patient subsequently underwent stent placement in the carotid and was started on Brilinta  and aspirin  therapy  1 month ago patient had not been feeling well was sent to the ER and repeat scan shows persistent left upper lobe nodule and right upper lobe ground glass opacification which led to PET CT scan  PET CT scan March 03, 2024 Independently reviewed by me today and also reviewed with radiology report 1. Hypermetabolic left upper lobe nodule compatible with malignancy. 2. Hypermetabolic left hilar and mediastinal adenopathy compatible with malignancy. 3. Subsolid right upper lobe nodule with low-grade metabolic activity, low-grade adenocarcinoma not excluded.  Patient is active smoker smoking cessation strongly advised Smoking Assessment and Cessation Counseling Upon further questioning, Patient smokes 1 ppd I have advised patient to quit/stop smoking as soon as possible due to high risk for multiple medical problems Patient is willing to quit smoking  I have advised patient that we can assist and have options of Nicotine  replacement therapy. I also advised patient on behavioral therapy and can provide oral medication therapy in conjunction with the other therapies Follow up next Office visit  for assessment of smoking cessation Smoking cessation counseling advised for 4 minutes Nicotine  patches prescribed  Other findings include bilateral emphysematous changes consistent with COPD however patient does  not have any significant respiratory compromise at this time and does not take any type of inhalers    The Risks and Benefits of the Bronchoscopy procedure with robotic navigation and endobronchial ultrasound were explained to patient  I have discussed the risk for Acute Bleeding, increased chance of Infection, increased chance of Respiratory Failure and Cardiac Arrest, increased chance of pneumothorax and collapsed lung, as well as increased Stroke and Death.  I have also explained to avoid all types of NSAIDs 1 week prior to procedure date  to decrease chance of bleeding, and also to avoid food and drinks the midnight prior to procedure.  I have contacted Dr. Lester patient's neurologist who states he had placed a stent back in February 2025 therefore patient can discontinue his Brilinta  and aspirin  therapy at this time  The procedure consists of a video camera with a light source to be placed and inserted  into the lungs to  look for abnormal tissue and to obtain tissue samples by using needle and biopsy tools.  The patient/family understand the risks and benefits and have agreed to proceed with procedure.   PAST MEDICAL HISTORY   Past Medical History:  Diagnosis Date  . Cancer (HCC) 2020  . ETOH abuse   . History of cerebral artery stenosis   . Hyperlipidemia   . Hypertension   . Stroke [facility])      SURGICAL HISTORY   Past Surgical History:  Procedure Laterality Date  . COLONOSCOPY WITH PROPOFOL  N/A 01/20/2019   Procedure: COLONOSCOPY WITH PROPOFOL ;  Surgeon: Toledo, Ladell POUR, MD;  Location: [facility] ENDOSCOPY;  Service: Gastroenterology;  Laterality: N/A;  . IR ANGIO EXTRACRAN SEL COM CAROTID INNOMINATE  UNI BILAT MOD SED  07/23/2017  . IR ANGIO INTRA EXTRACRAN SEL COM CAROTID INNOMINATE UNI R MOD SED  11/07/2017  . IR ANGIO VERTEBRAL SEL SUBCLAVIAN INNOMINATE UNI R MOD SED  11/07/2017  . IR ANGIO VERTEBRAL SEL VERTEBRAL BILAT MOD SED  07/23/2017  . IR CT HEAD LTD  06/11/2023  .  IR CT HEAD LTD  06/11/2023  . IR INTRA CRAN STENT  06/11/2023  . IR PERCUTANEOUS ART THROMBECTOMY/INFUSION INTRACRANIAL INC DIAG ANGIO  06/11/2023  . IR RADIOLOGIST EVAL & MGMT  06/28/2023  . MICROLARYNGOSCOPY N/A 06/30/2019   Procedure: SUSPENSION MICROLARYNGOSCOPY WITH MICROFLAP;  Surgeon: Milissa Hamming, MD;  Location: [facility] ORS;  Service: ENT;  Laterality: N/A;  . RADIOLOGY WITH ANESTHESIA N/A 11/07/2017   Procedure: STENTING;  Surgeon: Dolphus Carrion, MD;  Location: [facility] OR;  Service: Radiology;  Laterality: N/A;  . RADIOLOGY WITH ANESTHESIA N/A 06/11/2023   Procedure: IR WITH ANESTHESIA;  Surgeon: Radiologist, Medication, MD;  Location: [facility] OR;  Service: Radiology;  Laterality: N/A;  . TEE WITHOUT CARDIOVERSION N/A 11/15/2014   Procedure: TRANSESOPHAGEAL ECHOCARDIOGRAM (TEE);  Surgeon: Evalene JINNY Lunger, MD;  Location: [facility] ORS;  Service: Cardiovascular;  Laterality: N/A;  . TONSILLECTOMY       FAMILY HISTORY   Family History  Problem Relation Age of Onset  . Breast cancer Mother   . Cancer Mother   . Breast cancer Sister   . Cancer Sister      SOCIAL HISTORY   Social History   Tobacco Use  . Smoking status: Former    Current packs/day: 0.00    Types: Cigarettes    Quit date: 10/10/2023    Years since quitting: 0.3  . Smokeless tobacco: Never  . Tobacco comments:    Smokes 1-2 cigarettes occasionally when the cravings increase. Has requested a refill on nicotine  patches to use instead.  Vaping Use  . Vaping status: Never Used  Substance Use Topics  . Alcohol use: Not Currently  . Drug use: Never     MEDICATIONS    Home Medication:  Current Outpatient Rx  . Order #: [id number] Class: No Print  . Order #: [id number] Class: Normal  . Order #: [id number] Class: Normal  . Order #: [id number] Class: Normal  . Order #: [id number] Class: Normal  . Order #: [id number] Class: Normal  . Order #: [id number] Class: Normal    Current Medication:  Current Outpatient  Medications:  .  amLODipine  (NORVASC ) 5 MG tablet, Take 1 tablet (5 mg total) by mouth in the morning., Disp: , Rfl:  .  aspirin  81 MG chewable tablet, Chew 1 tablet (81 mg total) by mouth daily., Disp: 120 tablet, Rfl: 0 .  ezetimibe  (ZETIA ) 10 MG tablet, Take 1 tablet (10 mg total) by mouth daily., Disp: 30 tablet, Rfl: 0 .  Multiple Vitamin (MULTIVITAMIN WITH MINERALS) TABS tablet, Take 1 tablet by mouth daily. (Patient taking differently: Take 1 tablet by mouth daily. Gummies), Disp: 30 tablet, Rfl: 12 .  nicotine  (NICODERM CQ  - DOSED IN MG/24 HOURS) 21 mg/24hr patch, Place 1 patch (21 mg total) onto the skin daily., Disp: 28 patch, Rfl: 0 .  rosuvastatin  (CRESTOR ) 40 MG tablet, Take 1 tablet (40 mg total) by mouth daily., Disp: 30 tablet, Rfl: 0 .  ticagrelor  (BRILINTA ) 90 MG TABS tablet, Take 1 tablet (90 mg total) by mouth 2 (two) times daily., Disp: 60 tablet, Rfl: 0    ALLERGIES   Patient has no known allergies.   BP 120/80   Pulse 93  Temp 97.9 F (36.6 C)   Ht 6' (1.829 m)   Wt 139 lb 3.2 oz (63.1 kg)   SpO2 98%   BMI 18.88 kg/m    Review of Systems: Gen:  Denies  fever, sweats, chills weight loss  HEENT: Denies blurred vision, double vision, ear pain, eye pain, hearing loss, nose bleeds, sore throat Cardiac:  No dizziness, chest pain or heaviness, chest tightness,edema, No JVD Resp:   No cough, -sputum production, -shortness of breath,-wheezing, -hemoptysis,  Other:  All other systems negative   Physical Examination:   General Appearance: No distress  EYES PERRLA, EOM intact.   NECK Supple, No JVD Pulmonary: normal breath sounds, No wheezing.  CardiovascularNormal S1,S2.  No m/r/g.   Abdomen: Benign, Soft, non-tender. Neurology UE/LE 5/5 strength, no focal deficits Ext pulses intact, cap refill intact ALL OTHER ROS ARE NEGATIVE      IMAGING    NM PET Image Initial (PI) Skull Base To Thigh Result Date: 03/03/2024 CLINICAL DATA:  Initial treatment  strategy for left upper lobe pulmonary nodule. EXAM: NUCLEAR MEDICINE PET SKULL BASE TO THIGH TECHNIQUE: 7.5 mCi F-18 FDG was injected intravenously. Full-ring PET imaging was performed from the skull base to thigh after the radiotracer. CT data was obtained and used for attenuation correction and anatomic localization. Fasting blood glucose: 117 mg/dl COMPARISON:  CT scan 88/89/7974 FINDINGS: Mediastinal blood pool activity: SUV max 2.0 Liver activity: SUV max NA NECK: No significant abnormal hypermetabolic activity in this region. Incidental CT findings: Bilateral common carotid atheromatous vascular calcifications. CHEST: 2.2 by 1.1 cm apicoposterior segment left upper lobe nodule on image 45 series 4 has maximum SUV of 9.0, compatible with malignancy. Left hilar lymph node with hypermetabolic activity measuring 1.7 cm in short axis has a maximum SUV 11.7, compatible with malignancy. AP window lymph node measuring 1.1 cm in short axis on image 59 series 4 has maximum SUV of 8.3, compatible with malignant involvement. Small prevascular lymph node measuring 0.5 cm in short axis on image 49 series 4 has a maximum SUV of 7.3, compatible with malignancy. Subsolid right upper lobe nodule 2.3 by 1.5 cm on image 47 series 4 with maximum SUV 1.5, low-grade adenocarcinoma not excluded. Incidental CT findings: Emphysema. Coronary, aortic arch, and branch vessel atherosclerotic vascular disease. ABDOMEN/PELVIS: No significant abnormal hypermetabolic activity in this region. Incidental CT findings: Extensive abdominal aortic atherosclerosis noted with substantial resulting luminal narrowing of the abdominal aorta. Systemic atherosclerosis. SKELETON: No significant abnormal hypermetabolic activity in this region. Incidental CT findings: None. IMPRESSION: 1. Hypermetabolic left upper lobe nodule compatible with malignancy. 2. Hypermetabolic left hilar and mediastinal adenopathy compatible with malignancy. 3. Subsolid right  upper lobe nodule with low-grade metabolic activity, low-grade adenocarcinoma not excluded. 4. Aortic Atherosclerosis (ICD10-I70.0) and Emphysema (ICD10-J43.9). Electronically Signed   By: Ryan Salvage M.D.   On: 03/03/2024 10:19   MR BRAIN WO CONTRAST Result Date: 02/24/2024 EXAM: MRI BRAIN WITHOUT CONTRAST 02/24/2024 12:50:00 PM TECHNIQUE: Multiplanar multisequence MRI of the head/brain was performed without the administration of intravenous contrast. COMPARISON: CTA head and neck 02/24/2024. MRI head 10/04/2023. CLINICAL HISTORY: Mental status change, unknown cause. Intermittent dizziness and blurry vision. FINDINGS: BRAIN AND VENTRICLES: No acute infarct, mass, midline shift, hydrocephalus, or extra axial fluid collection is identified. A large chronic posterior right MCA infarct is again noted with a small amount of associated chronic blood products. There are also unchanged small chronic infarcts in the left cerebral hemisphere involving the frontal, parietal, and occipital lobes as  well as insula. Abnormal appearance of the distal left ICA was more fully evaluated on todays CTA. ORBITS: No acute abnormality. SINUSES AND MASTOIDS: No acute abnormality. BONES AND SOFT TISSUES: Normal marrow signal. No acute soft tissue abnormality. IMPRESSION: 1. No acute intracranial abnormality. 2. Chronic bilateral cerebral infarcts as above. Electronically signed by: Dasie Hamburg MD 02/24/2024 01:39 PM EST RP Workstation: [id number]   CT ANGIO HEAD NECK W WO CM Result Date: 02/24/2024 EXAM: CTA Head and Neck with Intravenous Contrast. CT Head without Contrast. CLINICAL HISTORY: 58 year old male. Neuro deficit, acute, stroke suspected. TECHNIQUE: Axial CTA images of the head and neck performed with intravenous contrast. MIP reconstructed images were created and reviewed. Axial computed tomography images of the head/brain performed without intravenous contrast. Note: Per PQRS, the description of internal carotid  artery percent stenosis, including 0 percent or normal exam, is based on [employer] Symptomatic Carotid Endarterectomy Trial (NASCET) criteria. Dose reduction technique was used including one or more of the following: automated exposure control, adjustment of mA and kV according to patient size, and/or iterative reconstruction. CONTRAST: Without and with; 75mL (iohexol  (OMNIPAQUE ) 350 MG/ML injection 75 mL IOHEXOL  350 MG/ML SOLN) COMPARISON: Brain MRI and CTA head and neck 10/04/2023. FINDINGS: CT HEAD: BRAIN: No acute intraparenchymal hemorrhage. No mass lesion. No CT evidence for acute territorial infarct. No midline shift or extra-axial collection. Chronic infarct and encephalomalacia posterior right MCA territory, stable. Stable underlying brain volume. Stable gray white differentiation elsewhere. Small area of chronic cortical or subcortical encephalomalacia anterior right middle frontal gyrus. VENTRICLES: No hydrocephalus. ORBITS: The orbits are unremarkable. SINUSES AND MASTOIDS: Visible paranasal sinuses, middle ears and mastoids remain well aerated. CTA NECK: COMMON CAROTID ARTERIES: Widespread low density soft plaque along the course of the left CCA. No significant stenosis. No dissection or occlusion. INTERNAL CAROTID ARTERIES: Cervical right carotid including soft and calcified plaque at the proximal right ICA but no significant stenosis. More advanced cervical left carotid atherosclerosis, including at the left ICA bulb. Complicated soft and calcified plaque in the proximal left ICA. Maximal stenosis at the distal bulb is less than 50 percent. No dissection or occlusion. VERTEBRAL ARTERIES: Calcified plaque at the right vertebral artery origin with only mild stenosis. Right vertebral artery is patent and stable to the vertebrobasilar junction which is diminutive. Patent right PICA origin. Early origin of the left vertebral artery on series 10 image 360. Mild left vertebral origin stenosis.  Diminutive left vertebral artery is stable since June, patent to the skull base and functionally terminates in PICA as before. No dissection or occlusion. CTA HEAD: ANTERIOR CEREBRAL ARTERIES: Both ACA origins are mildly irregular. Normal anterior communicating artery. No significant stenosis. No occlusion. No aneurysm. MIDDLE CEREBRAL ARTERIES: Normal bilaterally MCA origins. No significant stenosis. No occlusion. No aneurysm. POSTERIOR CEREBRAL ARTERIES: Fetal type right PCA origin also. These are normal anatomic variants. PCA origins remain patent. No significant stenosis. No occlusion. No aneurysm. BASILAR ARTERY: Chronically diminutive proximal basilar artery with a left side persistent trigeminal artery supplying the distal basilar artery. These are normal anatomic variants. SCA origins remain patent. No significant stenosis. No occlusion. No aneurysm. OTHER: Distal left ICA siphon is chronically occluded in the cavernous segment with moderate to severe upstream atherosclerosis in the petrous left ICA. Reconstituted enhancement at the left ICA terminus appears from Circle of Willis collaterals and stable. Contralateral right ICA siphon is severely atherosclerotic and stenotic with radiographic string sign stenosis in the distal cavernous and supraclinoid segments but remains  patent. Normal right posterior communicating artery origin. Major dural venous sinuses are enhancing and patent. SOFT TISSUES: Nonvascular neck soft tissues appear negative. No acute finding. No masses or lymphadenopathy. BONES: Cervical spine degeneration. No acute osseous abnormality. LUNGS: Moderate to severe upper lung emphysema. There is a new left upper lobe masslike opacity measuring up to 2.1 cm on series 8 image 165. This was present on previous studies but appears slightly increased now (versus series 10 image 166 in June) and should be considered bronchogenic carcinoma until proven otherwise. AORTIC ARCH: Aortic arch not  completely included today. Chronic atherosclerotic arch and slightly low line aortic arch configuration better demonstrated previously. IMPRESSION: 1. 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 Contralateral high-grade RADIOGRAPHIC STRING SIGN stenosis of the distal right ICA which remains patent. 2. Advanced emphysema with a slowly enlarging left upper lobe lung nodule now up to 2.1 cm; This should be considered bronchogenic Carcinoma until proven otherwise. 3. No hemodynamically significant extracranial atherosclerosis. 4. No acute intracranial abnormality.  Chronic right hemisphere infarcts. Electronically signed by: Helayne Hurst MD 02/24/2024 11:02 AM EST RP Workstation: [id number]   DG Chest Portable 1 View Result Date: 02/24/2024 EXAM: 1 VIEW(S) XRAY OF THE CHEST 02/24/2024 08:45:36 AM COMPARISON: Chest CT 06/11/2023. CLINICAL HISTORY: cough FINDINGS: LUNGS AND PLEURA: Hyperinflated lungs. Chronic emphysema. No focal pulmonary opacity. No pulmonary edema. No pleural effusion. No pneumothorax. HEART AND MEDIASTINUM: No acute abnormality of the cardiac and mediastinal silhouettes. BONES AND SOFT TISSUES: No acute osseous abnormality. IMPRESSION: 1. Emphysema. No superimposed acute cardiopulmonary abnormality. Electronically signed by: Helayne Hurst MD 02/24/2024 08:49 AM EST RP Workstation: [id number]      ASSESSMENT/PLAN   58 year old pleasant white male seen today for several issues including left lung nodule, right upper lobe ground glass opacification with mediastinal adenopathy highly suggestive of malignancy primary lung in the setting of underlying emphysematous changes and COPD  Lung nodule mediastinal adenopathy Plan for robotic navigation bronchoscopy and endobronchial  ultrasound Risks and benefits explained to the patient and patient agrees to proceed We will plan to stop Brilinta  and aspirin  therapy next week  Assessment of COPD Patient does not have any significant respiratory compromise at this time Patient continues to smoke Smoking cessation strongly advised   CURRENT MEDICATIONS REVIEWED AT LENGTH WITH PATIENT TODAY   Patient  satisfied with Plan of action and management. All questions answered   Follow up after the procedure   I spent a total of 64 minutes dedicated to the care of this patient on the date of this encounter to include pre-visit review of records, face-to-face time with the patient discussing conditions above, post visit ordering of testing, clinical documentation with the electronic health record, making appropriate referrals as documented, and communicating necessary information to the patient's healthcare team.    The Patient requires high complexity decision making for assessment and support, frequent evaluation and titration of therapies, application of advanced monitoring technologies and extensive interpretation of multiple databases.  Patient satisfied with Plan of action and management. All questions answered    Nickolas Alm Cellar, M.D.  [facility] Pulmonary & Critical Care Medicine  Medical Director [facility] [place]

## 2024-03-03 NOTE — Telephone Encounter (Signed)
 Hayley- Looks like patient was seen by Dr. Isaiah today/PET scan reviewed-bronch planned on December 5; please follow path ; and have the patient follow-up with me 1 week post bx--MD; labs CBC CMP- GB

## 2024-03-03 NOTE — Patient Instructions (Signed)
 We will plan for bronchoscopy with biopsies of your lung nodules on Monday, December 8 We will need to contact Dr. Lester to establish your medications and instructions  Avoid Allergens and Irritants Avoid secondhand smoke Avoid SICK contacts Recommend  Masking  when appropriate Recommend Keep up-to-date with vaccinations  Please stop smoking, Nicotine  Patches ordered

## 2024-03-03 NOTE — Progress Notes (Unsigned)
 [facility]  Pulmonary Medicine Consultation      Date: 03/03/2024,   MRN# [id number] MONTREY BUIST 11-02-1965     CHIEF COMPLAINT:   Assessment of abnormal CT scan and PET scan   HISTORY OF PRESENT ILLNESS   58 year old pleasant white male seen today for abnormal CT scan and PET scan Patient with a history of strokes and in February 2025 patient had symptoms of a stroke and CT of the chest showed left upper lobe nodule and right upper lobe ground glass opacification  Patient subsequently underwent stent placement in the carotid and was started on Brilinta  and aspirin  therapy  1 month ago patient had not been feeling well was sent to the ER and repeat scan shows persistent left upper lobe nodule and right upper lobe ground glass opacification which led to PET CT scan  PET CT scan March 03, 2024 Independently reviewed by me today and also reviewed with radiology report 1. Hypermetabolic left upper lobe nodule compatible with malignancy. 2. Hypermetabolic left hilar and mediastinal adenopathy compatible with malignancy. 3. Subsolid right upper lobe nodule with low-grade metabolic activity, low-grade adenocarcinoma not excluded.  Patient is active smoker smoking cessation strongly advised Smoking Assessment and Cessation Counseling Upon further questioning, Patient smokes 1 ppd I have advised patient to quit/stop smoking as soon as possible due to high risk for multiple medical problems Patient is willing to quit smoking  I have advised patient that we can assist and have options of Nicotine  replacement therapy. I also advised patient on behavioral therapy and can provide oral medication therapy in conjunction with the other therapies Follow up next Office visit  for assessment of smoking cessation Smoking cessation counseling advised for 4 minutes Nicotine  patches prescribed  Other findings include bilateral emphysematous changes consistent with COPD however patient does  not have any significant respiratory compromise at this time and does not take any type of inhalers    The Risks and Benefits of the Bronchoscopy procedure with robotic navigation and endobronchial ultrasound were explained to patient  I have discussed the risk for Acute Bleeding, increased chance of Infection, increased chance of Respiratory Failure and Cardiac Arrest, increased chance of pneumothorax and collapsed lung, as well as increased Stroke and Death.  I have also explained to avoid all types of NSAIDs 1 week prior to procedure date  to decrease chance of bleeding, and also to avoid food and drinks the midnight prior to procedure.  I have contacted Dr. Lester patient's neurologist who states he had placed a stent back in February 2025 therefore patient can discontinue his Brilinta  and aspirin  therapy at this time  The procedure consists of a video camera with a light source to be placed and inserted  into the lungs to  look for abnormal tissue and to obtain tissue samples by using needle and biopsy tools.  The patient/family understand the risks and benefits and have agreed to proceed with procedure.   PAST MEDICAL HISTORY   Past Medical History:  Diagnosis Date  . Cancer (HCC) 2020  . ETOH abuse   . History of cerebral artery stenosis   . Hyperlipidemia   . Hypertension   . Stroke [facility])      SURGICAL HISTORY   Past Surgical History:  Procedure Laterality Date  . COLONOSCOPY WITH PROPOFOL  N/A 01/20/2019   Procedure: COLONOSCOPY WITH PROPOFOL ;  Surgeon: Toledo, Ladell POUR, MD;  Location: [facility] ENDOSCOPY;  Service: Gastroenterology;  Laterality: N/A;  . IR ANGIO EXTRACRAN SEL COM CAROTID INNOMINATE  UNI BILAT MOD SED  07/23/2017  . IR ANGIO INTRA EXTRACRAN SEL COM CAROTID INNOMINATE UNI R MOD SED  11/07/2017  . IR ANGIO VERTEBRAL SEL SUBCLAVIAN INNOMINATE UNI R MOD SED  11/07/2017  . IR ANGIO VERTEBRAL SEL VERTEBRAL BILAT MOD SED  07/23/2017  . IR CT HEAD LTD  06/11/2023  .  IR CT HEAD LTD  06/11/2023  . IR INTRA CRAN STENT  06/11/2023  . IR PERCUTANEOUS ART THROMBECTOMY/INFUSION INTRACRANIAL INC DIAG ANGIO  06/11/2023  . IR RADIOLOGIST EVAL & MGMT  06/28/2023  . MICROLARYNGOSCOPY N/A 06/30/2019   Procedure: SUSPENSION MICROLARYNGOSCOPY WITH MICROFLAP;  Surgeon: Milissa Hamming, MD;  Location: [facility] ORS;  Service: ENT;  Laterality: N/A;  . RADIOLOGY WITH ANESTHESIA N/A 11/07/2017   Procedure: STENTING;  Surgeon: Dolphus Carrion, MD;  Location: [facility] OR;  Service: Radiology;  Laterality: N/A;  . RADIOLOGY WITH ANESTHESIA N/A 06/11/2023   Procedure: IR WITH ANESTHESIA;  Surgeon: Radiologist, Medication, MD;  Location: [facility] OR;  Service: Radiology;  Laterality: N/A;  . TEE WITHOUT CARDIOVERSION N/A 11/15/2014   Procedure: TRANSESOPHAGEAL ECHOCARDIOGRAM (TEE);  Surgeon: Evalene JINNY Lunger, MD;  Location: [facility] ORS;  Service: Cardiovascular;  Laterality: N/A;  . TONSILLECTOMY       FAMILY HISTORY   Family History  Problem Relation Age of Onset  . Breast cancer Mother   . Cancer Mother   . Breast cancer Sister   . Cancer Sister      SOCIAL HISTORY   Social History   Tobacco Use  . Smoking status: Former    Current packs/day: 0.00    Types: Cigarettes    Quit date: 10/10/2023    Years since quitting: 0.3  . Smokeless tobacco: Never  . Tobacco comments:    Smokes 1-2 cigarettes occasionally when the cravings increase. Has requested a refill on nicotine  patches to use instead.  Vaping Use  . Vaping status: Never Used  Substance Use Topics  . Alcohol use: Not Currently  . Drug use: Never     MEDICATIONS    Home Medication:  Current Outpatient Rx  . Order #: [id number] Class: No Print  . Order #: [id number] Class: Normal  . Order #: [id number] Class: Normal  . Order #: [id number] Class: Normal  . Order #: [id number] Class: Normal  . Order #: [id number] Class: Normal  . Order #: [id number] Class: Normal    Current Medication:  Current Outpatient  Medications:  .  amLODipine  (NORVASC ) 5 MG tablet, Take 1 tablet (5 mg total) by mouth in the morning., Disp: , Rfl:  .  aspirin  81 MG chewable tablet, Chew 1 tablet (81 mg total) by mouth daily., Disp: 120 tablet, Rfl: 0 .  ezetimibe  (ZETIA ) 10 MG tablet, Take 1 tablet (10 mg total) by mouth daily., Disp: 30 tablet, Rfl: 0 .  Multiple Vitamin (MULTIVITAMIN WITH MINERALS) TABS tablet, Take 1 tablet by mouth daily. (Patient taking differently: Take 1 tablet by mouth daily. Gummies), Disp: 30 tablet, Rfl: 12 .  nicotine  (NICODERM CQ  - DOSED IN MG/24 HOURS) 21 mg/24hr patch, Place 1 patch (21 mg total) onto the skin daily., Disp: 28 patch, Rfl: 0 .  rosuvastatin  (CRESTOR ) 40 MG tablet, Take 1 tablet (40 mg total) by mouth daily., Disp: 30 tablet, Rfl: 0 .  ticagrelor  (BRILINTA ) 90 MG TABS tablet, Take 1 tablet (90 mg total) by mouth 2 (two) times daily., Disp: 60 tablet, Rfl: 0    ALLERGIES   Patient has no known allergies.   BP 120/80   Pulse 93  Temp 97.9 F (36.6 C)   Ht 6' (1.829 m)   Wt 139 lb 3.2 oz (63.1 kg)   SpO2 98%   BMI 18.88 kg/m    Review of Systems: Gen:  Denies  fever, sweats, chills weight loss  HEENT: Denies blurred vision, double vision, ear pain, eye pain, hearing loss, nose bleeds, sore throat Cardiac:  No dizziness, chest pain or heaviness, chest tightness,edema, No JVD Resp:   No cough, -sputum production, -shortness of breath,-wheezing, -hemoptysis,  Other:  All other systems negative   Physical Examination:   General Appearance: No distress  EYES PERRLA, EOM intact.   NECK Supple, No JVD Pulmonary: normal breath sounds, No wheezing.  CardiovascularNormal S1,S2.  No m/r/g.   Abdomen: Benign, Soft, non-tender. Neurology UE/LE 5/5 strength, no focal deficits Ext pulses intact, cap refill intact ALL OTHER ROS ARE NEGATIVE      IMAGING    NM PET Image Initial (PI) Skull Base To Thigh Result Date: 03/03/2024 CLINICAL DATA:  Initial treatment  strategy for left upper lobe pulmonary nodule. EXAM: NUCLEAR MEDICINE PET SKULL BASE TO THIGH TECHNIQUE: 7.5 mCi F-18 FDG was injected intravenously. Full-ring PET imaging was performed from the skull base to thigh after the radiotracer. CT data was obtained and used for attenuation correction and anatomic localization. Fasting blood glucose: 117 mg/dl COMPARISON:  CT scan 88/89/7974 FINDINGS: Mediastinal blood pool activity: SUV max 2.0 Liver activity: SUV max NA NECK: No significant abnormal hypermetabolic activity in this region. Incidental CT findings: Bilateral common carotid atheromatous vascular calcifications. CHEST: 2.2 by 1.1 cm apicoposterior segment left upper lobe nodule on image 45 series 4 has maximum SUV of 9.0, compatible with malignancy. Left hilar lymph node with hypermetabolic activity measuring 1.7 cm in short axis has a maximum SUV 11.7, compatible with malignancy. AP window lymph node measuring 1.1 cm in short axis on image 59 series 4 has maximum SUV of 8.3, compatible with malignant involvement. Small prevascular lymph node measuring 0.5 cm in short axis on image 49 series 4 has a maximum SUV of 7.3, compatible with malignancy. Subsolid right upper lobe nodule 2.3 by 1.5 cm on image 47 series 4 with maximum SUV 1.5, low-grade adenocarcinoma not excluded. Incidental CT findings: Emphysema. Coronary, aortic arch, and branch vessel atherosclerotic vascular disease. ABDOMEN/PELVIS: No significant abnormal hypermetabolic activity in this region. Incidental CT findings: Extensive abdominal aortic atherosclerosis noted with substantial resulting luminal narrowing of the abdominal aorta. Systemic atherosclerosis. SKELETON: No significant abnormal hypermetabolic activity in this region. Incidental CT findings: None. IMPRESSION: 1. Hypermetabolic left upper lobe nodule compatible with malignancy. 2. Hypermetabolic left hilar and mediastinal adenopathy compatible with malignancy. 3. Subsolid right  upper lobe nodule with low-grade metabolic activity, low-grade adenocarcinoma not excluded. 4. Aortic Atherosclerosis (ICD10-I70.0) and Emphysema (ICD10-J43.9). Electronically Signed   By: Ryan Salvage M.D.   On: 03/03/2024 10:19   MR BRAIN WO CONTRAST Result Date: 02/24/2024 EXAM: MRI BRAIN WITHOUT CONTRAST 02/24/2024 12:50:00 PM TECHNIQUE: Multiplanar multisequence MRI of the head/brain was performed without the administration of intravenous contrast. COMPARISON: CTA head and neck 02/24/2024. MRI head 10/04/2023. CLINICAL HISTORY: Mental status change, unknown cause. Intermittent dizziness and blurry vision. FINDINGS: BRAIN AND VENTRICLES: No acute infarct, mass, midline shift, hydrocephalus, or extra axial fluid collection is identified. A large chronic posterior right MCA infarct is again noted with a small amount of associated chronic blood products. There are also unchanged small chronic infarcts in the left cerebral hemisphere involving the frontal, parietal, and occipital lobes as  well as insula. Abnormal appearance of the distal left ICA was more fully evaluated on todays CTA. ORBITS: No acute abnormality. SINUSES AND MASTOIDS: No acute abnormality. BONES AND SOFT TISSUES: Normal marrow signal. No acute soft tissue abnormality. IMPRESSION: 1. No acute intracranial abnormality. 2. Chronic bilateral cerebral infarcts as above. Electronically signed by: Dasie Hamburg MD 02/24/2024 01:39 PM EST RP Workstation: [id number]   CT ANGIO HEAD NECK W WO CM Result Date: 02/24/2024 EXAM: CTA Head and Neck with Intravenous Contrast. CT Head without Contrast. CLINICAL HISTORY: 58 year old male. Neuro deficit, acute, stroke suspected. TECHNIQUE: Axial CTA images of the head and neck performed with intravenous contrast. MIP reconstructed images were created and reviewed. Axial computed tomography images of the head/brain performed without intravenous contrast. Note: Per PQRS, the description of internal carotid  artery percent stenosis, including 0 percent or normal exam, is based on [employer] American Symptomatic Carotid Endarterectomy Trial (NASCET) criteria. Dose reduction technique was used including one or more of the following: automated exposure control, adjustment of mA and kV according to patient size, and/or iterative reconstruction. CONTRAST: Without and with; 75mL (iohexol  (OMNIPAQUE ) 350 MG/ML injection 75 mL IOHEXOL  350 MG/ML SOLN) COMPARISON: Brain MRI and CTA head and neck 10/04/2023. FINDINGS: CT HEAD: BRAIN: No acute intraparenchymal hemorrhage. No mass lesion. No CT evidence for acute territorial infarct. No midline shift or extra-axial collection. Chronic infarct and encephalomalacia posterior right MCA territory, stable. Stable underlying brain volume. Stable gray white differentiation elsewhere. Small area of chronic cortical or subcortical encephalomalacia anterior right middle frontal gyrus. VENTRICLES: No hydrocephalus. ORBITS: The orbits are unremarkable. SINUSES AND MASTOIDS: Visible paranasal sinuses, middle ears and mastoids remain well aerated. CTA NECK: COMMON CAROTID ARTERIES: Widespread low density soft plaque along the course of the left CCA. No significant stenosis. No dissection or occlusion. INTERNAL CAROTID ARTERIES: Cervical right carotid including soft and calcified plaque at the proximal right ICA but no significant stenosis. More advanced cervical left carotid atherosclerosis, including at the left ICA bulb. Complicated soft and calcified plaque in the proximal left ICA. Maximal stenosis at the distal bulb is less than 50 percent. No dissection or occlusion. VERTEBRAL ARTERIES: Calcified plaque at the right vertebral artery origin with only mild stenosis. Right vertebral artery is patent and stable to the vertebrobasilar junction which is diminutive. Patent right PICA origin. Early origin of the left vertebral artery on series 10 image 360. Mild left vertebral origin stenosis.  Diminutive left vertebral artery is stable since June, patent to the skull base and functionally terminates in PICA as before. No dissection or occlusion. CTA HEAD: ANTERIOR CEREBRAL ARTERIES: Both ACA origins are mildly irregular. Normal anterior communicating artery. No significant stenosis. No occlusion. No aneurysm. MIDDLE CEREBRAL ARTERIES: Normal bilaterally MCA origins. No significant stenosis. No occlusion. No aneurysm. POSTERIOR CEREBRAL ARTERIES: Fetal type right PCA origin also. These are normal anatomic variants. PCA origins remain patent. No significant stenosis. No occlusion. No aneurysm. BASILAR ARTERY: Chronically diminutive proximal basilar artery with a left side persistent trigeminal artery supplying the distal basilar artery. These are normal anatomic variants. SCA origins remain patent. No significant stenosis. No occlusion. No aneurysm. OTHER: Distal left ICA siphon is chronically occluded in the cavernous segment with moderate to severe upstream atherosclerosis in the petrous left ICA. Reconstituted enhancement at the left ICA terminus appears from Circle of Willis collaterals and stable. Contralateral right ICA siphon is severely atherosclerotic and stenotic with radiographic string sign stenosis in the distal cavernous and supraclinoid segments but remains  patent. Normal right posterior communicating artery origin. Major dural venous sinuses are enhancing and patent. SOFT TISSUES: Nonvascular neck soft tissues appear negative. No acute finding. No masses or lymphadenopathy. BONES: Cervical spine degeneration. No acute osseous abnormality. LUNGS: Moderate to severe upper lung emphysema. There is a new left upper lobe masslike opacity measuring up to 2.1 cm on series 8 image 165. This was present on previous studies but appears slightly increased now (versus series 10 image 166 in June) and should be considered bronchogenic carcinoma until proven otherwise. AORTIC ARCH: Aortic arch not  completely included today. Chronic atherosclerotic arch and slightly low line aortic arch configuration better demonstrated previously. IMPRESSION: 1. 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 Contralateral high-grade RADIOGRAPHIC STRING SIGN stenosis of the distal right ICA which remains patent. 2. Advanced emphysema with a slowly enlarging left upper lobe lung nodule now up to 2.1 cm; This should be considered bronchogenic Carcinoma until proven otherwise. 3. No hemodynamically significant extracranial atherosclerosis. 4. No acute intracranial abnormality.  Chronic right hemisphere infarcts. Electronically signed by: Helayne Hurst MD 02/24/2024 11:02 AM EST RP Workstation: [id number]   DG Chest Portable 1 View Result Date: 02/24/2024 EXAM: 1 VIEW(S) XRAY OF THE CHEST 02/24/2024 08:45:36 AM COMPARISON: Chest CT 06/11/2023. CLINICAL HISTORY: cough FINDINGS: LUNGS AND PLEURA: Hyperinflated lungs. Chronic emphysema. No focal pulmonary opacity. No pulmonary edema. No pleural effusion. No pneumothorax. HEART AND MEDIASTINUM: No acute abnormality of the cardiac and mediastinal silhouettes. BONES AND SOFT TISSUES: No acute osseous abnormality. IMPRESSION: 1. Emphysema. No superimposed acute cardiopulmonary abnormality. Electronically signed by: Helayne Hurst MD 02/24/2024 08:49 AM EST RP Workstation: [id number]      ASSESSMENT/PLAN   58 year old pleasant white male seen today for several issues including left lung nodule, right upper lobe ground glass opacification with mediastinal adenopathy highly suggestive of malignancy primary lung in the setting of underlying emphysematous changes and COPD  Lung nodule mediastinal adenopathy Plan for robotic navigation bronchoscopy and endobronchial  ultrasound Risks and benefits explained to the patient and patient agrees to proceed We will plan to stop Brilinta  and aspirin  therapy next week  Assessment of COPD Patient does not have any significant respiratory compromise at this time Patient continues to smoke Smoking cessation strongly advised   CURRENT MEDICATIONS REVIEWED AT LENGTH WITH PATIENT TODAY   Patient  satisfied with Plan of action and management. All questions answered   Follow up after the procedure   I spent a total of 64 minutes dedicated to the care of this patient on the date of this encounter to include pre-visit review of records, face-to-face time with the patient discussing conditions above, post visit ordering of testing, clinical documentation with the electronic health record, making appropriate referrals as documented, and communicating necessary information to the patient's healthcare team.    The Patient requires high complexity decision making for assessment and support, frequent evaluation and titration of therapies, application of advanced monitoring technologies and extensive interpretation of multiple databases.  Patient satisfied with Plan of action and management. All questions answered    Nickolas Alm Cellar, M.D.  [facility] Pulmonary & Critical Care Medicine  Medical Director [facility] [place]

## 2024-03-04 NOTE — Telephone Encounter (Signed)
 Prior Auth Not Required for the codes 68372 Refer # [id number] Refer # [id number] Refer # [phone] [id number]

## 2024-03-04 NOTE — Telephone Encounter (Signed)
 Noted. Nothing further needed.

## 2024-03-16 ENCOUNTER — Encounter
Admission: RE | Admit: 2024-03-16 | Discharge: 2024-03-16 | Disposition: A | Source: Ambulatory Visit | Attending: Internal Medicine | Admitting: Internal Medicine

## 2024-03-16 ENCOUNTER — Other Ambulatory Visit: Payer: Self-pay

## 2024-03-16 HISTORY — DX: Cerebral atherosclerosis: I67.2

## 2024-03-16 HISTORY — DX: Anxiety disorder, unspecified: F41.9

## 2024-03-16 HISTORY — DX: Atherosclerosis of aorta: I70.0

## 2024-03-16 HISTORY — DX: Other complications of anesthesia, initial encounter: T88.59XA

## 2024-03-16 HISTORY — DX: Prediabetes: R73.03

## 2024-03-16 HISTORY — DX: Dyspnea, unspecified: R06.00

## 2024-03-16 NOTE — Patient Instructions (Addendum)
 Your procedure is scheduled on: 03/23/24 - Monday Report to the Registration Desk on the 1st floor of the [facility]. To find out your arrival time, please call [phone] between 1PM - 3PM on: 03/20/24 - Friday If your arrival time is 6:00 am, do not arrive before that time as the [facility] entrance doors do not open until 6:00 am.  REMEMBER: Instructions that are not followed completely may result in serious medical risk, up to and including death; or upon the discretion of your surgeon and anesthesiologist your surgery may need to be rescheduled.  Do not eat food or drink any liquids after midnight the night before surgery.  No gum chewing or hard candies.  One week prior to surgery: Stop Anti-inflammatories (NSAIDS) such as Advil, Aleve, Ibuprofen, Motrin, Naproxen, Naprosyn and Aspirin  based products such as Excedrin, Goody's Powder, BC Powder. You may take Tylenol  if needed for pain up until the day of surgery.  Stop ANY OVER THE COUNTER supplements until after surgery.  ON THE DAY OF SURGERY ONLY TAKE THESE MEDICATIONS WITH SIPS OF WATER:  amLODipine  (NORVASC )    No Alcohol for 24 hours before or after surgery.  No Smoking including e-cigarettes for 24 hours before surgery.  No chewable tobacco products for at least 6 hours before surgery.  No nicotine  patches on the day of surgery.  Do not use any recreational drugs for at least a week (preferably 2 weeks) before your surgery.  Please be advised that the combination of cocaine and anesthesia may have negative outcomes, up to and including death. If you test positive for cocaine, your surgery will be cancelled.  On the morning of surgery brush your teeth with toothpaste and water, you may rinse your mouth with mouthwash if you wish. Do not swallow any toothpaste or mouthwash.  Do not wear jewelry, make-up, hairpins, clips or nail polish.  For welded (permanent) jewelry: bracelets, anklets, waist bands, etc.   Please have this removed prior to surgery.  If it is not removed, there is a chance that hospital personnel will need to cut it off on the day of surgery.  Do not wear lotions, powders, or perfumes.   Do not shave body hair from the neck down 48 hours before surgery.  Contact lenses, hearing aids and dentures may not be worn into surgery.  Do not bring valuables to the hospital. [facility] is not responsible for any missing/lost belongings or valuables.   Notify your doctor if there is any change in your medical condition (cold, fever, infection).  Wear comfortable clothing (specific to your surgery type) to the hospital.  After surgery, you can help prevent lung complications by doing breathing exercises.  Take deep breaths and cough every 1-2 hours. Your doctor may order a device called an Incentive Spirometer to help you take deep breaths. If you are being admitted to the hospital overnight, leave your suitcase in the car. After surgery it may be brought to your room.  In case of increased patient census, it may be necessary for you, the patient, to continue your postoperative care in the Same Day Surgery department.  If you are being discharged the day of surgery, you will not be allowed to drive home. You will need a responsible individual to drive you home and stay with you for 24 hours after surgery.   If you are taking public transportation, you will need to have a responsible individual with you.  Please call the Pre-admissions Testing Dept.  at [phone] if you have any questions about these instructions.  Surgery Visitation Policy:  Patients having surgery or a procedure may have two visitors.  Children under the age of 6 must have an adult with them who is not the patient.  Inpatient Visitation:    Visiting hours are 7 a.m. to 8 p.m. Up to four visitors are allowed at one time in a patient room. The visitors may rotate out with other people during the day.  One  visitor age 42 or older may stay with the patient overnight and must be in the room by 8 p.m.   Merchandiser, Retail to address health-related social needs:  [email or web address]

## 2024-03-18 ENCOUNTER — Inpatient Hospital Stay: Attending: Internal Medicine

## 2024-03-20 ENCOUNTER — Ambulatory Visit
Admission: RE | Admit: 2024-03-20 | Discharge: 2024-03-20 | Disposition: A | Source: Ambulatory Visit | Attending: Internal Medicine | Admitting: Internal Medicine

## 2024-03-20 DIAGNOSIS — R918 Other nonspecific abnormal finding of lung field: Secondary | ICD-10-CM

## 2024-03-22 MED ORDER — LACTATED RINGERS IV SOLN: INTRAVENOUS | Status: DC

## 2024-03-22 MED ORDER — ORAL CARE MOUTH RINSE: 15.0000 mL | Freq: Once | OROMUCOSAL | Status: AC

## 2024-03-22 MED ORDER — CHLORHEXIDINE GLUCONATE 0.12 % MT SOLN
15.0000 mL | Freq: Once | OROMUCOSAL | Status: AC
  Administered 2024-03-23: 15 mL via OROMUCOSAL

## 2024-03-23 ENCOUNTER — Ambulatory Visit

## 2024-03-23 ENCOUNTER — Encounter: Payer: Self-pay | Admitting: Internal Medicine

## 2024-03-23 ENCOUNTER — Ambulatory Visit: Payer: Self-pay | Admitting: Urgent Care

## 2024-03-23 ENCOUNTER — Encounter: Admission: RE | Disposition: A | Payer: Self-pay | Source: Home / Self Care | Attending: Internal Medicine

## 2024-03-23 ENCOUNTER — Ambulatory Visit
Admission: RE | Admit: 2024-03-23 | Discharge: 2024-03-23 | Disposition: A | Attending: Internal Medicine | Admitting: Internal Medicine

## 2024-03-23 ENCOUNTER — Other Ambulatory Visit: Payer: Self-pay

## 2024-03-23 ENCOUNTER — Ambulatory Visit: Admitting: Anesthesiology

## 2024-03-23 DIAGNOSIS — R911 Solitary pulmonary nodule: Secondary | ICD-10-CM

## 2024-03-23 DIAGNOSIS — R599 Enlarged lymph nodes, unspecified: Secondary | ICD-10-CM

## 2024-03-23 DIAGNOSIS — R918 Other nonspecific abnormal finding of lung field: Secondary | ICD-10-CM | POA: Insufficient documentation

## 2024-03-23 HISTORY — PX: ENDOBRONCHIAL ULTRASOUND: SHX5096

## 2024-03-23 HISTORY — PX: VIDEO BRONCHOSCOPY WITH ENDOBRONCHIAL NAVIGATION: SHX6175

## 2024-03-23 SURGERY — VIDEO BRONCHOSCOPY WITH ENDOBRONCHIAL NAVIGATION
Anesthesia: General | Laterality: Left

## 2024-03-23 MED ORDER — CHLORHEXIDINE GLUCONATE 0.12 % MT SOLN
OROMUCOSAL | Status: AC
  Filled 2024-03-23: qty 15

## 2024-03-23 MED ORDER — LACTATED RINGERS IV SOLN: INTRAVENOUS | Status: DC

## 2024-03-23 MED ORDER — ACETAMINOPHEN 10 MG/ML IV SOLN: 1000.0000 mg | Freq: Once | INTRAVENOUS | Status: DC | PRN

## 2024-03-23 MED ORDER — MIDAZOLAM HCL 2 MG/2ML IJ SOLN
INTRAMUSCULAR | Status: AC
  Filled 2024-03-23: qty 2

## 2024-03-23 MED ORDER — OXYCODONE HCL 5 MG PO TABS: 5.0000 mg | ORAL_TABLET | Freq: Once | ORAL | Status: DC | PRN

## 2024-03-23 MED ORDER — DEXAMETHASONE SOD PHOSPHATE PF 10 MG/ML IJ SOLN
INTRAMUSCULAR | Status: DC | PRN
  Administered 2024-03-23: 4 mg via INTRAVENOUS

## 2024-03-23 MED ORDER — PHENYLEPHRINE 80 MCG/ML (10ML) SYRINGE FOR IV PUSH (FOR BLOOD PRESSURE SUPPORT)
PREFILLED_SYRINGE | INTRAVENOUS | Status: DC | PRN
  Administered 2024-03-23: 80 ug via INTRAVENOUS
  Administered 2024-03-23: 160 ug via INTRAVENOUS
  Administered 2024-03-23: 80 ug via INTRAVENOUS
  Administered 2024-03-23: 120 ug via INTRAVENOUS

## 2024-03-23 MED ORDER — ONDANSETRON HCL 4 MG/2ML IJ SOLN
INTRAMUSCULAR | Status: AC
  Filled 2024-03-23: qty 2

## 2024-03-23 MED ORDER — MIDAZOLAM HCL (PF) 2 MG/2ML IJ SOLN
INTRAMUSCULAR | Status: DC | PRN
  Administered 2024-03-23: 2 mg via INTRAVENOUS

## 2024-03-23 MED ORDER — FENTANYL CITRATE (PF) 100 MCG/2ML IJ SOLN
INTRAMUSCULAR | Status: DC | PRN
  Administered 2024-03-23 (×2): 50 ug via INTRAVENOUS

## 2024-03-23 MED ORDER — PROPOFOL 10 MG/ML IV BOLUS
INTRAVENOUS | Status: DC | PRN
  Administered 2024-03-23: 200 mg via INTRAVENOUS

## 2024-03-23 MED ORDER — OXYCODONE HCL 5 MG/5ML PO SOLN: 5.0000 mg | Freq: Once | ORAL | Status: DC | PRN

## 2024-03-23 MED ORDER — FENTANYL CITRATE (PF) 100 MCG/2ML IJ SOLN
INTRAMUSCULAR | Status: AC
  Filled 2024-03-23: qty 2

## 2024-03-23 MED ORDER — FENTANYL CITRATE (PF) 100 MCG/2ML IJ SOLN: 25.0000 ug | INTRAMUSCULAR | Status: DC | PRN

## 2024-03-23 MED ORDER — ONDANSETRON HCL 4 MG/2ML IJ SOLN: 4.0000 mg | Freq: Once | INTRAMUSCULAR | Status: DC | PRN

## 2024-03-23 MED ORDER — PHENYLEPHRINE 80 MCG/ML (10ML) SYRINGE FOR IV PUSH (FOR BLOOD PRESSURE SUPPORT)
PREFILLED_SYRINGE | INTRAVENOUS | Status: AC
  Filled 2024-03-23: qty 10

## 2024-03-23 MED ORDER — ROCURONIUM BROMIDE 10 MG/ML (PF) SYRINGE
PREFILLED_SYRINGE | INTRAVENOUS | Status: AC
  Filled 2024-03-23: qty 10

## 2024-03-23 MED ORDER — LIDOCAINE HCL (PF) 2 % IJ SOLN
INTRAMUSCULAR | Status: AC
  Filled 2024-03-23: qty 5

## 2024-03-23 MED ORDER — ONDANSETRON HCL 4 MG/2ML IJ SOLN
INTRAMUSCULAR | Status: DC | PRN
  Administered 2024-03-23: 4 mg via INTRAVENOUS

## 2024-03-23 MED ORDER — PROPOFOL 10 MG/ML IV BOLUS
INTRAVENOUS | Status: AC
  Filled 2024-03-23: qty 20

## 2024-03-23 MED ORDER — SUGAMMADEX SODIUM 200 MG/2ML IV SOLN
INTRAVENOUS | Status: DC | PRN
  Administered 2024-03-23: 200 mg via INTRAVENOUS

## 2024-03-23 MED ORDER — ROCURONIUM BROMIDE 100 MG/10ML IV SOLN
INTRAVENOUS | Status: DC | PRN
  Administered 2024-03-23: 10 mg via INTRAVENOUS
  Administered 2024-03-23: 60 mg via INTRAVENOUS

## 2024-03-23 MED ORDER — LIDOCAINE HCL (CARDIAC) PF 100 MG/5ML IV SOSY
PREFILLED_SYRINGE | INTRAVENOUS | Status: DC | PRN
  Administered 2024-03-23 (×2): 50 mg via INTRAVENOUS

## 2024-03-23 NOTE — Transfer of Care (Signed)
 Immediate Anesthesia Transfer of Care Note  Patient: Henry Boone  Procedure(s) Performed: VIDEO BRONCHOSCOPY WITH ENDOBRONCHIAL NAVIGATION (Bilateral) ENDOBRONCHIAL ULTRASOUND (EBUS) (Left)  Patient Location: PACU  Anesthesia Type:General  Level of Consciousness: awake, sedated, and drowsy  Airway & Oxygen Therapy: Patient Spontanous Breathing and Patient connected to face mask oxygen  Post-op Assessment: Report given to RN and Post -op Vital signs reviewed and stable  Post vital signs: Reviewed and stable  Last Vitals:  Vitals Value Taken Time  BP 149/79 03/23/24 12:40  Temp    Pulse 79 03/23/24 12:42  Resp 22 03/23/24 12:42  SpO2 100 % 03/23/24 12:42  Vitals shown include unfiled device data.  Last Pain:  Vitals:   03/23/24 0847  TempSrc: Temporal  PainSc: 0-No pain         Complications: No notable events documented.

## 2024-03-23 NOTE — Anesthesia Postprocedure Evaluation (Signed)
 Anesthesia Post Note  Patient: Henry Boone  Procedure(s) Performed: VIDEO BRONCHOSCOPY WITH ENDOBRONCHIAL NAVIGATION (Bilateral) ENDOBRONCHIAL ULTRASOUND (EBUS) (Left)  Patient location during evaluation: PACU Anesthesia Type: General Level of consciousness: awake and alert Pain management: pain level controlled Vital Signs Assessment: post-procedure vital signs reviewed and stable Respiratory status: spontaneous breathing, nonlabored ventilation and respiratory function stable Cardiovascular status: blood pressure returned to baseline and stable Postop Assessment: no apparent nausea or vomiting Anesthetic complications: no   No notable events documented.   Last Vitals:  Vitals:   03/23/24 1330 03/23/24 1343  BP: 126/79 129/86  Pulse: 77 76  Resp: 20 20  Temp:  37.3 C  SpO2: 95% 96%    Last Pain:  Vitals:   03/23/24 1343  TempSrc: Temporal  PainSc: 0-No pain                 Camellia Merilee Louder

## 2024-03-23 NOTE — Anesthesia Preprocedure Evaluation (Addendum)
 Anesthesia Evaluation  Patient identified by MRN, date of birth, ID band Patient awake    Reviewed: Allergy & Precautions, NPO status , Patient's Chart, lab work & pertinent test results  History of Anesthesia Complications Negative for: history of anesthetic complications  Airway Mallampati: II   Neck ROM: Full    Dental  (+) Missing   Pulmonary Current Smoker (2 cigarettes per day) and Patient abstained from smoking.   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; residual left-sided numbness and weakness; able to ambulate independently)    GI/Hepatic negative GI ROS,,,  Endo/Other  Prediabetes   Renal/GU negative Renal ROS     Musculoskeletal   Abdominal   Peds  Hematology negative hematology ROS (+)   Anesthesia Other Findings   Reproductive/Obstetrics                              Anesthesia Physical Anesthesia Plan  ASA: 3  Anesthesia Plan: General   Post-op Pain Management:    Induction: Intravenous  PONV Risk Score and Plan: 1 and Ondansetron , Dexamethasone  and Treatment may vary due to age or medical condition  Airway Management Planned: Oral ETT  Additional Equipment:   Intra-op Plan:   Post-operative Plan: Extubation in OR  Informed Consent: I have reviewed the patients History and Physical, chart, labs and discussed the procedure including the risks, benefits and alternatives for the proposed anesthesia with the patient or authorized representative who has indicated his/her understanding and acceptance.     Dental advisory given  Plan Discussed with: CRNA  Anesthesia Plan  Comments: (Patient consented for risks of anesthesia including but not limited to:  - adverse reactions to medications - damage to eyes, teeth, lips or other oral mucosa - nerve damage due to positioning  - sore throat or hoarseness - damage to heart, brain, nerves, lungs, other parts of body or loss of life  Informed patient about role of CRNA in peri- and intra-operative care.  Patient voiced understanding.)         Anesthesia Quick Evaluation

## 2024-03-23 NOTE — Op Note (Addendum)
 Video Bronchoscopy with Endobronchial Ultrasound and Electromagnetic Navigation Procedure Note  Date of Operation: 03/23/2024   Surgeon:Kolston Lacount  Anesthesia: GENERAL ANESTHESIA  Operation: Flexible video fiberoptic bronchoscopy with endobronchial ultrasound, robotic assisted navigation and biopsies.  Estimated Blood Loss: Minimal  Complications: none  Indications and History: CHUN SELLEN is a 58 y.o. male with LUL mass and L hilar  adenopathy.  Recommendation was made to achieve a tissue diagnosis using endobronchial ultrasound and robotic assisted navigational bronchoscopy. The risks, benefits, complications, treatment options and expected outcomes were discussed with the patient.  The possibilities of pneumothorax, pneumonia, reaction to medication, pulmonary aspiration, perforation of a viscus, bleeding, failure to diagnose a condition and creating a complication requiring transfusion or operation were discussed with the patient who freely signed the consent.   Patient was marked for procedure Left Hand  Description of Procedure: The patient was seen in the Preoperative Area, was examined and was deemed appropriate to proceed.  The patient identified as MARISSA LOWREY and the procedure verified as Flexible Video Fiberoptic Bronchoscopy with robotic assisted navigation and endobronchial ultrasound.  A Time Out was held and the above information confirmed.    FLEX BRONCHOSCOPY WAS PERFORMED BEFORE AND AFTER THE PROCEDURE FOR AIRWAY EXAMINATION AND THERAPEUTIC ASPIRATION OF TRACHEOBRONCHIAL TREE. THICK MUCUS PLUGS EXTRACTED FROM ALL SEGMENTS    Robotic assisted navigation: Prior to the date of the procedure a high-resolution CT scan of the chest was performed. Utilizing ION software program a virtual tracheobronchial tree was generated to allow the creation of distinct navigation pathways to the patient's parenchymal abnormalities. After being taken to the operating room general anesthesia was  initiated and the patient  was orally intubated. The video fiberoptic bronchoscope was introduced via the endotracheal tube and a general inspection was performed which showed normal right and left lung anatomy. Aspiration of the bilateral mainstems was completed to remove any remaining secretions. Robotic catheter inserted into patient's endotracheal tube.   Target #1 LUL: The distinct navigation pathways prepared prior to this procedure were then utilized to navigate to patient's lesion identified on CT scan. The robotic catheter was secured into place and the vision probe was withdrawn.  Lesion location was approximated using fluoroscopy.  Local registration and targeting was performed using GE mobile C-arm three-dimensional imaging. Under fluoroscopic guidance transbronchial needle brushings, transbronchial needle biopsies(4), and transbronchial forceps biopsies(8) were performed to be sent for cytology and pathology.  Needle-in-lesion was confirmed using GE mobile C-arm.      Clinical indication Lung Nodule/Lung Mass  Technique Intraoperative Cone Beam Computed Tomography (CBCT) images were acquired using a [employer] OEC 3D system, optimized for thoracic imaging. A high-quality 30-second acquisition protocol was used. The patient was positioned supine on the examination table. Pre-biopsy CBCT scans were performed to confirm the lesion's visibility and to determine the optimal approach. Navigation was performed using an [employer] ([employer]uitive). Repeat CBCT scans were performed to confirm tool-in-lesion placement and to assess for complications.  Findings LUL The nodule exhibits a lobulated appearance. Bronchial Anatomy: Patent airways are visualized up to the segmental bronchi leading towards the target nodule. Navigation: The Ion bronchoscopic navigation successfully acquired the target and guided biopsy tools towards the LUL Tool-in-Lesion Confirmation: Intraoperative  CBCT confirmed the accurate positioning of the biopsy forceps within the nodule. Complications: No immediate complications such as pneumothorax or hemorrhage were observed during or immediately following the procedure.  Impression CBCT-guided navigation bronchoscopy with biopsy was performed for the peripheral pulmonary nodule in the  LUL. The procedure was technically successful with confirmed tool-in-lesion placement. Samples were obtained for histological and cytological examination.  Recommendations Submit biopsy samples for histopathological and cytological analysis. Images were uploaded to PACS. Schedule follow-up appointment with the referring physician to discuss the results and treatment plan. Monitor for delayed complications such as pneumothorax or hemorrhage.   Please see associated operative report for robotic bronchoscopy description.    Endobronchial ultrasound: The robotic scope was then withdrawn and the endobronchial ultrasound was used to identify and characterize the peritracheal, hilar and bronchial lymph nodes. Inspection showed 10L mild adenopathy. Using real-time ultrasound guidance Wang needle biopsies were take from Station 10L nodes and were sent for cytology.   At the end of the procedure a general airway inspection was performed and there was no evidence of active bleeding. The bronchoscope was removed.  The patient tolerated the procedure well. There was no significant blood loss and there were no obvious complications. A post-procedural chest x-ray is pending.   EBUS Samples: 2 Wang needle biopsies 19 gauge  from 10L node   Nickolas Alm Cellar, M.D.  [facility]  Medical Director [facility]

## 2024-03-23 NOTE — Interval H&P Note (Signed)
  The Risks and Benefits of the  procedure were explained to patient.  I have discussed the risk for Acute Bleeding, increased chance of Infection, increased chance of Respiratory Failure and Cardiac Arrest, increased chance of pneumothorax and collapsed lung, as well as increased Stroke and Death.   The procedure consists of a video camera with a light source to be placed and inserted  into the lungs to  look for abnormal tissue and to obtain tissue samples by using needle and biopsy tools.  The patient understand the risks and benefits and have agreed to proceed with procedure.

## 2024-03-23 NOTE — Anesthesia Procedure Notes (Signed)
 Procedure Name: Intubation Date/Time: 03/23/2024 11:24 AM  Performed by: Trudy Rankin LABOR, CRNAPre-anesthesia Checklist: Patient identified, Patient being monitored, Timeout performed, Emergency Drugs available and Suction available Patient Re-evaluated:Patient Re-evaluated prior to induction Oxygen Delivery Method: Circle System Utilized Preoxygenation: Pre-oxygenation with 100% oxygen Induction Type: IV induction and Rapid sequence Laryngoscope Size: Mac, 3 and McGrath Grade View: Grade I Tube type: Oral Tube size: 9.0 mm Number of attempts: 1 Airway Equipment and Method: Stylet and Video-laryngoscopy Placement Confirmation: ETT inserted through vocal cords under direct vision, positive ETCO2 and breath sounds checked- equal and bilateral Secured at: 22 cm Tube secured with: Tape Dental Injury: Teeth and Oropharynx as per pre-operative assessment

## 2024-03-24 ENCOUNTER — Encounter: Payer: Self-pay | Admitting: Internal Medicine

## 2024-03-26 LAB — SURGICAL PATHOLOGY

## 2024-03-26 LAB — CYTOLOGY - NON PAP

## 2024-03-30 ENCOUNTER — Encounter: Payer: Self-pay | Admitting: *Deleted

## 2024-03-30 ENCOUNTER — Encounter: Payer: Self-pay | Admitting: Internal Medicine

## 2024-03-30 ENCOUNTER — Inpatient Hospital Stay: Attending: Internal Medicine | Admitting: Internal Medicine

## 2024-03-30 VITALS — BP 159/93 | HR 88 | Temp 97.0°F | Ht 72.0 in | Wt 137.6 lb

## 2024-03-30 DIAGNOSIS — R911 Solitary pulmonary nodule: Secondary | ICD-10-CM | POA: Diagnosis not present

## 2024-03-30 DIAGNOSIS — C3412 Malignant neoplasm of upper lobe, left bronchus or lung: Secondary | ICD-10-CM | POA: Insufficient documentation

## 2024-03-30 DIAGNOSIS — Z79899 Other long term (current) drug therapy: Secondary | ICD-10-CM | POA: Insufficient documentation

## 2024-03-30 DIAGNOSIS — R634 Abnormal weight loss: Secondary | ICD-10-CM | POA: Insufficient documentation

## 2024-03-30 DIAGNOSIS — F17210 Nicotine dependence, cigarettes, uncomplicated: Secondary | ICD-10-CM | POA: Diagnosis not present

## 2024-03-30 MED ORDER — ONDANSETRON HCL 8 MG PO TABS: 8.0000 mg | ORAL_TABLET | Freq: Three times a day (TID) | ORAL | 1 refills | Status: AC | PRN

## 2024-03-30 MED ORDER — LIDOCAINE-PRILOCAINE 2.5-2.5 % EX CREA: TOPICAL_CREAM | CUTANEOUS | 3 refills | Status: AC

## 2024-03-30 MED ORDER — PROCHLORPERAZINE MALEATE 10 MG PO TABS: 10.0000 mg | ORAL_TABLET | Freq: Four times a day (QID) | ORAL | 1 refills | Status: AC | PRN

## 2024-03-30 NOTE — Assessment & Plan Note (Addendum)
#   NOV-DEC 2025-incidental.  CTA head and neck - Advanced emphysema with a slowly enlarging left upper lobe lung nodule now up to 2.1 cm; 1. Lung, biopsy, LUL :  CARCINOMA CONSISTENT WITH ADENOCARCINOMA [Dr.Kasa]. NOV 2025-  Hypermetabolic left upper lobe nodule compatible with malignancy;  Hypermetabolic left hilar and mediastinal adenopathy compatible with malignancy. Subsolid right upper lobe nodule with low-grade metabolic. Stage III- T1N2 Vs Stage ? IV [contralateral]. MRI brain- NOV 2025- NEG for metastases.   # Long discussion with the patient regarding the goal of treatment of stage III lung cancer-goal is cure.  5-year survival is in the order of -30 to 40%.  Patient has upfront unresectable disease-given his ongoing smoking/emphysema/history of stroke/contralateral lung nodule-unlikely candidate for any surgical options.  However-discussed with the patient he is interested in being evaluated for possible surgery-after neoadjuvant chemo-immunotherapy.  Will make a referral to Dr. Kerrin in [place].  Will also review at tumor conference.  # Discussed the role of concurrent chemoradiation [weekly carbotaxol with the daily radiation/Monday through Friday ~6 weeks].  Reviewed with the patient the potential side effects of radiation include skin rash; radiation esophagitis/pneumonitis. # Post chemoradiation-consolidation immunotherapy with durvalumab every 4 weeks is recommended based on data from [facility] clinical trial.  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  As per patient is not symptomatic.  He also states that he has never been told to have emphysema.     # Hx of stroke [Dr.Heck; [place]]-on Brilinta  plus aspirin .  Last stroke as per patient June 2025-chronic tingling and numbness of the extremities residual from stroke- stable.   # Active smoker: Patient has been trying to quit smoking.  # Chemotherapy education/nutrition evaluation: port placement. Plan start chemotherapy Sent the scripts for  antiemetics-Zofran  and Compazine ; EMLA  cream sent to pharmacy  # DISPOSITION:  # refer to Chemo education ASAP- re: chemo # refer to IR re: port placement # refer to Nutrition/speech path re: cancer/concern for malnutrition  # refer to Dr.Chrystal re: Lung cancer- # follow up TBD -Dr.B  # 40 minutes face-to-face with the patient discussing the above plan of care; more than 50% of time spent on prognosis/ natural history; counseling and coordination.

## 2024-03-30 NOTE — Assessment & Plan Note (Addendum)
 Henry Boone

## 2024-03-30 NOTE — Progress Notes (Signed)
 [facility] CONSULT NOTE  Patient Care Team: [name], MD as PCP - General (Family Medicine) Verdene Gills, RN as Oncology Nurse Navigator Rennie Cindy SAUNDERS, MD as Consulting Physician (Oncology)  CHIEF COMPLAINTS/PURPOSE OF CONSULTATION: Lung cancer  Oncology History  Primary cancer of left upper lobe of lung (HCC)  03/30/2024 Initial Diagnosis   Primary cancer of left upper lobe of lung (HCC)   03/30/2024 Cancer Staging   Staging form: Lung, AJCC V9 - Clinical: Stage IIIB (cT2a, cN2b, cM0) - Signed by Rennie Cindy SAUNDERS, MD on 03/30/2024   03/31/2024 -  Chemotherapy   Patient is on Treatment Plan : LUNG Carboplatin + Paclitaxel + XRT q7d        HISTORY OF PRESENTING ILLNESS:  Henry Boone 58 y.o.  male with a history of active smoking, history of stroke-  i is here to review the results of his workup for lung cancer.  Discussed the use of AI scribe software for clinical note transcription with the patient, who gave verbal consent to proceed.  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  He was recently diagnosed with a 2.2 cm left upper lobe lung adenocarcinoma, confirmed by biopsy, with hypermetabolic lymphadenopathy in the left hilum and mediastinum on PET scan, consistent with stage III disease (T2N2M0). There is also a right upper lobe nodule, approximately 1 inch in size, which is less FDG-avid and may represent a low-grade malignancy.  He has a history of emphysema. He continues to smoke.  He has experienced unintentional weight loss and reports difficulty gaining weight despite dietary modifications. He has recently gained a small amount of weight but remains below his baseline. He expresses concern about potential anorexia during chemotherapy and describes  a longstanding pattern of eating small meals and reduced cooking frequency.  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 He inquired about the feasibility of working during therapy and discussed treatment scheduling logistics. He is accompanied by a family member who confirms his nutritional habits.      Review of Systems  Constitutional:  Positive for malaise/fatigue. Negative for chills, diaphoresis, fever and weight loss.  HENT:  Negative for nosebleeds and sore throat.   Eyes:  Negative for double vision.  Respiratory:  Positive for cough and shortness of breath. Negative for hemoptysis, sputum production and wheezing.   Cardiovascular:  Negative for chest pain, palpitations, orthopnea and leg swelling.  Gastrointestinal:  Negative for abdominal pain, blood in stool, constipation, diarrhea, heartburn, melena, nausea and vomiting.  Genitourinary:  Negative for dysuria, frequency and urgency.  Musculoskeletal:  Positive for back pain and joint pain.  Skin: Negative.  Negative for itching and rash.  Neurological:  Negative for dizziness, tingling, focal weakness, weakness and headaches.  Endo/Heme/Allergies:  Does not bruise/bleed easily.  Psychiatric/Behavioral:  Negative for depression. The patient is not nervous/anxious and does not have insomnia.     MEDICAL HISTORY:  Past Medical History:  Diagnosis Date   Abdominal aortic atherosclerosis    Anxiety    situational   Cancer (HCC) 2020   Complication of anesthesia    slow to wake up, anesthesia lingered for 24 hrs after , used a different anesthesia   Dyspnea    ETOH  abuse    History of cerebral artery stenosis    Hyperlipidemia    Hypertension    Intracranial atherosclerosis    Pre-diabetes    Stroke [facility])     SURGICAL HISTORY: Past Surgical History:  Procedure Laterality Date   COLONOSCOPY WITH PROPOFOL  N/A 01/20/2019   Procedure:  COLONOSCOPY WITH PROPOFOL ;  Surgeon: Toledo, Ladell POUR, MD;  Location: [facility] ENDOSCOPY;  Service: Gastroenterology;  Laterality: N/A;   ENDOBRONCHIAL ULTRASOUND Left 03/23/2024   Procedure: ENDOBRONCHIAL ULTRASOUND (EBUS);  Surgeon: Kasa, Kurian, MD;  Location: [facility] ORS;  Service: Pulmonary;  Laterality: Left;   IR ANGIO EXTRACRAN SEL COM CAROTID INNOMINATE UNI BILAT MOD SED  07/23/2017   IR ANGIO INTRA EXTRACRAN SEL COM CAROTID INNOMINATE UNI R MOD SED  11/07/2017   IR ANGIO VERTEBRAL SEL SUBCLAVIAN INNOMINATE UNI R MOD SED  11/07/2017   IR ANGIO VERTEBRAL SEL VERTEBRAL BILAT MOD SED  07/23/2017   IR CT HEAD LTD  06/11/2023   IR CT HEAD LTD  06/11/2023   IR INTRA CRAN STENT  06/11/2023   IR PERCUTANEOUS ART THROMBECTOMY/INFUSION INTRACRANIAL INC DIAG ANGIO  06/11/2023   IR RADIOLOGIST EVAL & MGMT  06/28/2023   MICROLARYNGOSCOPY N/A 06/30/2019   Procedure: SUSPENSION MICROLARYNGOSCOPY WITH MICROFLAP;  Surgeon: Milissa Hamming, MD;  Location: [facility] ORS;  Service: ENT;  Laterality: N/A;   RADIOLOGY WITH ANESTHESIA N/A 11/07/2017   Procedure: STENTING;  Surgeon: Dolphus Carrion, MD;  Location: [facility] OR;  Service: Radiology;  Laterality: N/A;   RADIOLOGY WITH ANESTHESIA N/A 06/11/2023   Procedure: IR WITH ANESTHESIA;  Surgeon: Radiologist, Medication, MD;  Location: [facility] OR;  Service: Radiology;  Laterality: N/A;   TEE WITHOUT CARDIOVERSION N/A 11/15/2014   Procedure: TRANSESOPHAGEAL ECHOCARDIOGRAM (TEE);  Surgeon: Evalene JINNY Lunger, MD;  Location: [facility] ORS;  Service: Cardiovascular;  Laterality: N/A;   TONSILLECTOMY     VIDEO BRONCHOSCOPY WITH ENDOBRONCHIAL NAVIGATION Bilateral 03/23/2024   Procedure: VIDEO BRONCHOSCOPY WITH ENDOBRONCHIAL NAVIGATION;  Surgeon: Isaiah Scrivener, MD;  Location: [facility] ORS;  Service: Pulmonary;  Laterality: Bilateral;    SOCIAL HISTORY: Social History   Socioeconomic History   Marital status: Significant Other    Spouse name: [name],[name] (Significant other)    Number of children: Not on file   Years of education: Not on file   Highest education level: Not on file  Occupational History   Not on file  Tobacco Use   Smoking status: Some Days    Current packs/day: 0.00    Types: Cigarettes    Last attempt to quit: 10/10/2023    Years since quitting: 0.4   Smokeless tobacco: Never   Tobacco comments:    Smokes 1-2 cigarettes occasionally when the cravings increase. Has requested a refill on nicotine  patches to use instead.  Vaping Use   Vaping status: Never Used  Substance and Sexual Activity   Alcohol use: Not Currently   Drug use: Never   Sexual activity: Yes  Other Topics Concern   Not on file  Social History Narrative   Not on file   Social Drivers of Health   Tobacco Use: High Risk (03/30/2024)   Patient History    Smoking Tobacco Use: Some Days    Smokeless Tobacco Use: Never    Passive Exposure: Not on file  Financial Resource Strain: Low Risk  (04/11/2023)   Received from [facility]   Overall Financial Resource Strain (CARDIA)    Difficulty of Paying Living Expenses: Not hard at all  Food Insecurity: No  Food Insecurity (02/26/2024)   Epic    Worried About Programme Researcher, Broadcasting/film/video in the Last Year: Never true    Ran Out of Food in the Last Year: Never true  Transportation Needs: No Transportation Needs (02/26/2024)   Epic    Lack of Transportation (Medical): No    Lack of Transportation (Non-Medical): No  Physical Activity: Not on file  Stress: Not on file  Social Connections: Not on file  Intimate Partner Violence: Not At Risk (02/26/2024)   Epic    Fear of Current or Ex-Partner: No    Emotionally Abused: No    Physically Abused: No    Sexually Abused: No  Depression (PHQ2-9): Low Risk (03/30/2024)   Depression (PHQ2-9)    PHQ-2 Score: 0  Alcohol Screen: Not on file  Housing: Low Risk (02/26/2024)   Epic    Unable to Pay for Housing in the Last Year: No    Number of Times Moved in the Last Year: 0     Homeless in the Last Year: No  Utilities: Not At Risk (02/26/2024)   Epic    Threatened with loss of utilities: No  Health Literacy: Not on file    FAMILY HISTORY: Family History  Problem Relation Age of Onset   Breast cancer Mother    Cancer Mother    Breast cancer Sister    Cancer Sister     ALLERGIES:  has no known allergies.  MEDICATIONS:  Current Outpatient Medications  Medication Sig Dispense Refill   amLODipine  (NORVASC ) 5 MG tablet Take 1 tablet (5 mg total) by mouth in the morning.     aspirin  EC 81 MG tablet Take 81 mg by mouth daily. Swallow whole.     ezetimibe  (ZETIA ) 10 MG tablet Take 1 tablet (10 mg total) by mouth daily. 30 tablet 0   Multiple Vitamin (MULTIVITAMIN WITH MINERALS) TABS tablet Take 1 tablet by mouth daily. 30 tablet 12   rosuvastatin  (CRESTOR ) 40 MG tablet Take 1 tablet (40 mg total) by mouth daily. 30 tablet 0   lidocaine -prilocaine  (EMLA ) cream Apply to affected area once 30 g 3   nicotine  (NICODERM CQ  - DOSED IN MG/24 HOURS) 21 mg/24hr patch Place 1 patch (21 mg total) onto the skin daily. (Patient not taking: Reported on 03/30/2024) 28 patch 0   ondansetron  (ZOFRAN ) 8 MG tablet Take 1 tablet (8 mg total) by mouth every 8 (eight) hours as needed for nausea or vomiting. Start on the third day after chemotherapy. 30 tablet 1   prochlorperazine  (COMPAZINE ) 10 MG tablet Take 1 tablet (10 mg total) by mouth every 6 (six) hours as needed for nausea or vomiting. 30 tablet 1   No current facility-administered medications for this visit.    PHYSICAL EXAMINATION:  Vitals:   03/30/24 0902 03/30/24 0912  BP: (!) 144/90 (!) 159/93  Pulse: 88   Temp: (!) 97 F (36.1 C)   SpO2: 100%     Filed Weights   03/30/24 0902  Weight: 137 lb 9.6 oz (62.4 kg)     Physical Exam Vitals and nursing note reviewed.  HENT:     Head: Normocephalic and atraumatic.     Mouth/Throat:     Pharynx: Oropharynx is clear.  Eyes:     Extraocular Movements:  Extraocular movements intact.     Pupils: Pupils are equal, round, and reactive to light.  Cardiovascular:     Rate and Rhythm: Normal rate and regular rhythm.  Pulmonary:  Comments: Decreased breath sounds bilaterally.  Abdominal:     Palpations: Abdomen is soft.  Musculoskeletal:        General: Normal range of motion.     Cervical back: Normal range of motion.  Skin:    General: Skin is warm.  Neurological:     General: No focal deficit present.     Mental Status: He is alert and oriented to person, place, and time.  Psychiatric:        Behavior: Behavior normal.        Judgment: Judgment normal.     LABORATORY DATA:  I have reviewed the data as listed Lab Results  Component Value Date   WBC 9.6 02/24/2024   HGB 16.1 02/24/2024   HCT 48.3 02/24/2024   MCV 86.6 02/24/2024   PLT 344 02/24/2024   Recent Labs    10/04/23 0923 10/05/23 0752 02/24/24 0810  NA 137 138 139  K 4.0 3.7 3.7  CL 103 104 104  CO2 24 29 24   GLUCOSE 139* 107* 133*  BUN 11 8 8   CREATININE 0.68 0.91 0.81  CALCIUM  9.3 9.3 9.2  GFRNONAA >60 >60 >60  PROT 6.7 6.4* 7.3  ALBUMIN  4.0 3.7 4.0  AST 16 15 21   ALT 13 14 20   ALKPHOS 66 63 70  BILITOT 0.6 0.6 0.9    RADIOGRAPHIC STUDIES: I have personally reviewed the radiological images as listed and agreed with the findings in the report. CT SUPER D CHEST WO MONARCH PILOT Result Date: 03/23/2024 CLINICAL DATA:  Lung mass, surgical planning. EXAM: CT CHEST WITHOUT CONTRAST TECHNIQUE: Multidetector CT imaging of the chest was performed using thin slice collimation for electromagnetic bronchoscopy planning purposes, without intravenous contrast. RADIATION DOSE REDUCTION: This exam was performed according to the departmental dose-optimization program which includes automated exposure control, adjustment of the mA and/or kV according to patient size and/or use of iterative reconstruction technique. COMPARISON:  Chest CT 06/11/2023.  PET-CT 03/03/2024.  FINDINGS: Cardiovascular: Atherosclerosis of the aorta, great vessels and coronary arteries. The heart size is normal. Stable mild pericardial thickening versus small pericardial effusion. Mediastinum/Nodes: Compared with the previous diagnostic CT, there has been interval enlargement of an AP window node which measures 1.4 cm short axis on image 74/2, corresponding with a hypermetabolic node on PET-CT. There is also progressive enlargement of the left hilum, corresponding with a hypermetabolic left hilar lymph node. No other enlarged mediastinal, hilar or axillary lymph nodes are demonstrated on noncontrast imaging. Small hiatal hernia. Lungs/Pleura: Moderate to severe centrilobular and paraseptal emphysema. There is no pleural effusion or pneumothorax. The hypermetabolic left apical nodule measures 2.3 x 1.1 cm on image 37/4 (1.5 x 1.0 cm on previous CT). Part solid and cystic right upper lobe lesion measures 2.4 x 1.4 cm on image 40/4, not significantly changed. Part solid density associated with subpleural paraseptal emphysematous changes posteriorly in the right upper lobe appears unchanged, best seen on sagittal image 69/6. Other smaller nodules are grossly stable, including an approximately 4 mm part solid nodule posteriorly in the right upper lobe (image 74/4) and a tiny left lower lobe nodule on image 107/4. No other enlarging nodules identified. Upper abdomen: The visualized upper abdomen appears stable, without acute findings. Musculoskeletal/Chest wall: There is no chest wall mass or suspicious osseous finding. IMPRESSION: 1. Imaging for surgical planning. 2. Since previous chest CT of 10 months ago, interval enlargement of left apical nodule and left hilar and AP window lymph nodes, hypermetabolic on PET-CT, consistent with  progressive metastatic disease. 3. Other pulmonary nodules are grossly stable. Recommend attention on follow-up, especially the part solid right apical lesion. 4. No other evidence  of metastatic disease. 5. Aortic Atherosclerosis (ICD10-I70.0) and Emphysema (ICD10-J43.9). Electronically Signed   By: Elsie Perone M.D.   On: 03/23/2024 13:40   DG Chest Port 1 View Result Date: 03/23/2024 CLINICAL DATA:  Post bronchoscopy. EXAM: PORTABLE CHEST 1 VIEW COMPARISON:  Chest radiographs 02/24/2024 and 07/23/2017. Chest CT 03/20/2024. FINDINGS: 1253 hours. Two views submitted. The heart size and mediastinal contours are stable. Known left apical nodule appears less well-defined and larger, likely secondary to mild hemorrhage following presumed biopsy. There is no confluent airspace disease, pneumothorax or significant pleural effusion. The bones appear unchanged. IMPRESSION: 1. No evidence of pneumothorax following bronchoscopy. 2. Suspected mild hemorrhage surrounding the known left upper lobe nodule. Electronically Signed   By: Elsie Perone M.D.   On: 03/23/2024 13:27   DG C-Arm 1-60 Min-No Report Result Date: 03/23/2024 Fluoroscopy was utilized by the requesting physician.  No radiographic interpretation.   NM PET Image Initial (PI) Skull Base To Thigh Result Date: 03/03/2024 CLINICAL DATA:  Initial treatment strategy for left upper lobe pulmonary nodule. EXAM: NUCLEAR MEDICINE PET SKULL BASE TO THIGH TECHNIQUE: 7.5 mCi F-18 FDG was injected intravenously. Full-ring PET imaging was performed from the skull base to thigh after the radiotracer. CT data was obtained and used for attenuation correction and anatomic localization. Fasting blood glucose: 117 mg/dl COMPARISON:  CT scan 88/89/7974 FINDINGS: Mediastinal blood pool activity: SUV max 2.0 Liver activity: SUV max NA NECK: No significant abnormal hypermetabolic activity in this region. Incidental CT findings: Bilateral common carotid atheromatous vascular calcifications. CHEST: 2.2 by 1.1 cm apicoposterior segment left upper lobe nodule on image 45 series 4 has maximum SUV of 9.0, compatible with malignancy. Left hilar lymph node  with hypermetabolic activity measuring 1.7 cm in short axis has a maximum SUV 11.7, compatible with malignancy. AP window lymph node measuring 1.1 cm in short axis on image 59 series 4 has maximum SUV of 8.3, compatible with malignant involvement. Small prevascular lymph node measuring 0.5 cm in short axis on image 49 series 4 has a maximum SUV of 7.3, compatible with malignancy. Subsolid right upper lobe nodule 2.3 by 1.5 cm on image 47 series 4 with maximum SUV 1.5, low-grade adenocarcinoma not excluded. Incidental CT findings: Emphysema. Coronary, aortic arch, and branch vessel atherosclerotic vascular disease. ABDOMEN/PELVIS: No significant abnormal hypermetabolic activity in this region. Incidental CT findings: Extensive abdominal aortic atherosclerosis noted with substantial resulting luminal narrowing of the abdominal aorta. Systemic atherosclerosis. SKELETON: No significant abnormal hypermetabolic activity in this region. Incidental CT findings: None. IMPRESSION: 1. Hypermetabolic left upper lobe nodule compatible with malignancy. 2. Hypermetabolic left hilar and mediastinal adenopathy compatible with malignancy. 3. Subsolid right upper lobe nodule with low-grade metabolic activity, low-grade adenocarcinoma not excluded. 4. Aortic Atherosclerosis (ICD10-I70.0) and Emphysema (ICD10-J43.9). Electronically Signed   By: Ryan Salvage M.D.   On: 03/03/2024 10:19     Primary cancer of left upper lobe of lung (HCC) # NOV-DEC 2025-incidental.  CTA head and neck - Advanced emphysema with a slowly enlarging left upper lobe lung nodule now up to 2.1 cm; 1. Lung, biopsy, LUL :  CARCINOMA CONSISTENT WITH ADENOCARCINOMA [Dr.Kasa]. NOV 2025-  Hypermetabolic left upper lobe nodule compatible with malignancy;  Hypermetabolic left hilar and mediastinal adenopathy compatible with malignancy. Subsolid right upper lobe nodule with low-grade metabolic. Stage III- T1N2 Vs Stage ? IV [contralateral].  MRI brain- NOV 2025-  NEG for metastases.   # Long discussion with the patient regarding the goal of treatment of stage III lung cancer-goal is cure.  5-year survival is in the order of -30 to 40%.  Patient has upfront unresectable disease-given his ongoing smoking/emphysema/history of stroke/contralateral lung nodule-unlikely candidate for any surgical options.  However-discussed with the patient he is interested in being evaluated for possible surgery-after neoadjuvant chemo-immunotherapy.  Will make a referral to Dr. Kerrin in [place].  Will also review at tumor conference.  # Discussed the role of concurrent chemoradiation [weekly carbotaxol with the daily radiation/Monday through Friday ~6 weeks].  Reviewed with the patient the potential side effects of radiation include skin rash; radiation esophagitis/pneumonitis. # Post chemoradiation-consolidation immunotherapy with durvalumab every 4 weeks is recommended based on data from [facility] clinical trial.  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  As per patient is not symptomatic.  He also states that he has never been told to have emphysema.     # Hx of stroke [Dr.Heck; [place]]-on Brilinta  plus aspirin .  Last stroke as per patient June 2025-chronic tingling and numbness of the extremities residual from stroke- stable.   # Active smoker: Patient has been trying to quit smoking.  # Chemotherapy education/nutrition evaluation: port placement. Plan start chemotherapy Sent the scripts for antiemetics-Zofran  and Compazine ; EMLA  cream sent to pharmacy  # DISPOSITION:  # refer to Chemo education ASAP- re: chemo # refer to IR re: port placement # refer to Nutrition/speech path re: cancer/concern for malnutrition  # refer to Dr.Chrystal re: Lung cancer- # follow up TBD -Dr.B  # 40 minutes face-to-face with the patient discussing the above plan of care; more than 50% of time spent on prognosis/ natural history; counseling and coordination.  Above plan of care was discussed  with patient/family in detail.  My contact information was given to the patient/family.     Cindy JONELLE Joe, MD 03/30/2024 12:34 PM

## 2024-03-30 NOTE — Progress Notes (Signed)
 Patient presents today to discuss recent biopsy results. No additional concerns today.

## 2024-03-30 NOTE — Progress Notes (Signed)
 START ON PATHWAY REGIMEN - Non-Small Cell Lung     A cycle is every 7 days, concurrent with RT:     Paclitaxel      Carboplatin   **Always confirm dose/schedule in your pharmacy ordering system**  Patient Characteristics: Preoperative or Nonsurgical Candidate (Clinical Staging), Stage IIB (N2a only) or Stage III - Nonsurgical Candidate, PS = 0,1 Therapeutic Status: Preoperative or Nonsurgical Candidate (Clinical Staging) Check here if patient was staged using an edition other than AJCC Staging 9th Edition: false AJCC T Category: cT1c AJCC N Category: cN2b AJCC M Category: cM0 AJCC 9 Stage Grouping: IIIA ECOG Performance Status: 0 Intent of Therapy: Curative Intent, Discussed with Patient

## 2024-03-30 NOTE — Progress Notes (Signed)
 Met with patient during follow up visit with Dr. Rennie. All questions answered during visit. Reviewed upcoming appts. Informed that will call with further appts once scheduled. Pt given cancer resource guide and contact information. Instructed to call with any questions or needs. Pt verbalized understanding.

## 2024-03-31 ENCOUNTER — Other Ambulatory Visit: Payer: Self-pay

## 2024-03-31 ENCOUNTER — Telehealth: Payer: Self-pay | Admitting: *Deleted

## 2024-03-31 DIAGNOSIS — R0602 Shortness of breath: Secondary | ICD-10-CM

## 2024-03-31 DIAGNOSIS — R918 Other nonspecific abnormal finding of lung field: Secondary | ICD-10-CM

## 2024-03-31 DIAGNOSIS — Z720 Tobacco use: Secondary | ICD-10-CM

## 2024-03-31 NOTE — Telephone Encounter (Signed)
 Pt notified of appt for his Port placement on Friday 04/03/24. [facility]. Procedure time 2 pm Nothing to eat or drink 8 hrs prior to procedure. He will need a driver due to moderate sedation.

## 2024-04-01 ENCOUNTER — Encounter: Payer: Self-pay | Admitting: Radiation Oncology

## 2024-04-01 ENCOUNTER — Encounter: Payer: Self-pay | Admitting: *Deleted

## 2024-04-01 ENCOUNTER — Inpatient Hospital Stay

## 2024-04-01 ENCOUNTER — Ambulatory Visit
Admission: RE | Admit: 2024-04-01 | Discharge: 2024-04-01 | Disposition: A | Discharge status: Home / Self Care | Source: Ambulatory Visit | Attending: Radiation Oncology | Admitting: Radiation Oncology

## 2024-04-01 VITALS — BP 138/87 | HR 84 | Temp 97.0°F | Resp 16 | Wt 139.0 lb

## 2024-04-01 DIAGNOSIS — Z79899 Other long term (current) drug therapy: Secondary | ICD-10-CM | POA: Insufficient documentation

## 2024-04-01 DIAGNOSIS — I672 Cerebral atherosclerosis: Secondary | ICD-10-CM | POA: Insufficient documentation

## 2024-04-01 DIAGNOSIS — R059 Cough, unspecified: Secondary | ICD-10-CM | POA: Insufficient documentation

## 2024-04-01 DIAGNOSIS — Z803 Family history of malignant neoplasm of breast: Secondary | ICD-10-CM | POA: Insufficient documentation

## 2024-04-01 DIAGNOSIS — Z8673 Personal history of transient ischemic attack (TIA), and cerebral infarction without residual deficits: Secondary | ICD-10-CM | POA: Insufficient documentation

## 2024-04-01 DIAGNOSIS — R911 Solitary pulmonary nodule: Secondary | ICD-10-CM | POA: Insufficient documentation

## 2024-04-01 DIAGNOSIS — C3412 Malignant neoplasm of upper lobe, left bronchus or lung: Secondary | ICD-10-CM

## 2024-04-01 DIAGNOSIS — Z7982 Long term (current) use of aspirin: Secondary | ICD-10-CM | POA: Insufficient documentation

## 2024-04-01 DIAGNOSIS — F17210 Nicotine dependence, cigarettes, uncomplicated: Secondary | ICD-10-CM | POA: Insufficient documentation

## 2024-04-01 DIAGNOSIS — R590 Localized enlarged lymph nodes: Secondary | ICD-10-CM | POA: Insufficient documentation

## 2024-04-01 DIAGNOSIS — Z9221 Personal history of antineoplastic chemotherapy: Secondary | ICD-10-CM | POA: Insufficient documentation

## 2024-04-01 NOTE — Progress Notes (Signed)
 Multidisciplinary Oncology Council Documentation  HERIBERTO STMARTIN was presented by our [facility] on 04/01/2024, which included representatives from:  Palliative Care Dietitian  Physical/Occupational Therapist Nurse Navigator Genetics Social work Survivorship RN Financial Navigator Research RN   Bilal currently presents with history of lung cancer  We reviewed previous medical and familial history, history of present illness, and recent lab results along with all available histopathologic and imaging studies. The MOC considered available treatment options and made the following recommendations/referrals:  SW, nutrition  The MOC is a meeting of clinicians from various specialty areas who evaluate and discuss patients for whom a multidisciplinary approach is being considered. Final determinations in the plan of care are those of the provider(s).   Today's extended care, comprehensive team conference, Alonza was not present for the discussion and was not examined.

## 2024-04-01 NOTE — Progress Notes (Signed)
 Met with patient during initial consult with Dr. Lenn. All questions answered during visit. Reviewed upcoming appts. Instructed pt to contact pulmonary clinic to schedule PFT's and informed that once scheduled will coordinate appt with thoracic surgeon. Nothing further needed at this time. Instructed to call back with any questions or needs. Pt verbalized understanding.

## 2024-04-01 NOTE — Consult Note (Signed)
 NEW PATIENT EVALUATION  Name: Henry Boone  MRN: [id number]  Date:   04/01/2024     DOB: May 21, 1965   This 58 y.o. 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  REFERRING PHYSICIAN: Rennie Cindy SAUNDERS, MD  CHIEF COMPLAINT:  Chief Complaint  Patient presents with   Lung Cancer    DIAGNOSIS: The encounter diagnosis was Primary cancer of left upper lobe of lung (HCC).   PREVIOUS INVESTIGATIONS:  CT scans PET CT scan MRI of brain reviewed Clinical notes reviewed Labs reviewed  HPI: Patient is a 58 year old male with history of multiple strokes.  He recently had workup for what was presumed stroke and was found to have a 2.2 cm left upper lobe mass.  MRI scan in November showed chronic bilateral cerebral infarcts.  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  He also advanced emphysema and a slow enlarging left upper lobe lung nodule.  This was considered bronchogenic carcinoma proven otherwise.  PET scan demonstrated hypermetabolic left upper lobe nodule compatible with malignancy.  Also had hypermetabolic left hilar and mediastinal adenopathy compatible with malignancy.  He had a subsolid right upper lobe nodule with low-grade metabolic activity although low-grade adenocarcinoma cannot be excluded.  He underwent a left upper lobe biopsy which is carcinoma consistent with adenocarcinoma.  He has been seen by medical oncology with recommendation for either either concurrent chemoradiation therapy or neoadjuvant chemotherapy followed by surgical resection.  He is being scheduled to see thoracic surgery as well as complete PFTs.  Patient is fairly asymptomatic does have a cough no hemoptysis chest tightness or any significant dyspnea on  exertion.  PLANNED TREATMENT REGIMEN: Neoadjuvant chemotherapy plus surgery versus concurrent chemoradiation  PAST MEDICAL HISTORY:  has a past medical history of Abdominal aortic atherosclerosis, Anxiety, Cancer (HCC) (2020), Complication of anesthesia, Dyspnea, ETOH abuse, History of cerebral artery stenosis, Hyperlipidemia, Hypertension, Intracranial atherosclerosis, Pre-diabetes, and Stroke (HCC).    PAST SURGICAL HISTORY:  Past Surgical History:  Procedure Laterality Date   COLONOSCOPY WITH PROPOFOL  N/A 01/20/2019   Procedure: COLONOSCOPY WITH PROPOFOL ;  Surgeon: Toledo, Ladell POUR, MD;  Location: [facility];  Service: Gastroenterology;  Laterality: N/A;   ENDOBRONCHIAL ULTRASOUND Left 03/23/2024   Procedure: ENDOBRONCHIAL ULTRASOUND (EBUS);  Surgeon: Kasa, Kurian, MD;  Location: [facility];  Service: Pulmonary;  Laterality: Left;   IR ANGIO EXTRACRAN SEL COM CAROTID INNOMINATE UNI BILAT MOD SED  07/23/2017   IR ANGIO INTRA EXTRACRAN SEL COM CAROTID INNOMINATE UNI R MOD SED  11/07/2017   IR ANGIO VERTEBRAL SEL SUBCLAVIAN INNOMINATE UNI R MOD SED  11/07/2017   IR ANGIO VERTEBRAL SEL VERTEBRAL BILAT MOD SED  07/23/2017   IR CT HEAD LTD  06/11/2023   IR CT HEAD LTD  06/11/2023   IR INTRA CRAN STENT  06/11/2023   IR PERCUTANEOUS ART THROMBECTOMY/INFUSION INTRACRANIAL INC DIAG ANGIO  06/11/2023   IR RADIOLOGIST EVAL & MGMT  06/28/2023   MICROLARYNGOSCOPY N/A 06/30/2019   Procedure: SUSPENSION MICROLARYNGOSCOPY WITH MICROFLAP;  Surgeon: Milissa Hamming, MD;  Location: [facility];  Service: ENT;  Laterality: N/A;   RADIOLOGY WITH ANESTHESIA N/A 11/07/2017   Procedure: STENTING;  Surgeon: Dolphus Carrion, MD;  Location: [facility];  Service: Radiology;  Laterality: N/A;   RADIOLOGY  WITH ANESTHESIA N/A 06/11/2023   Procedure: IR WITH ANESTHESIA;  Surgeon: Radiologist, Medication, MD;  Location: [facility];  Service: Radiology;  Laterality: N/A;   TEE WITHOUT CARDIOVERSION N/A 11/15/2014    Procedure: TRANSESOPHAGEAL ECHOCARDIOGRAM (TEE);  Surgeon: Evalene JINNY Lunger, MD;  Location: [facility];  Service: Cardiovascular;  Laterality: N/A;   TONSILLECTOMY     VIDEO BRONCHOSCOPY WITH ENDOBRONCHIAL NAVIGATION Bilateral 03/23/2024   Procedure: VIDEO BRONCHOSCOPY WITH ENDOBRONCHIAL NAVIGATION;  Surgeon: Isaiah Scrivener, MD;  Location: [facility];  Service: Pulmonary;  Laterality: Bilateral;    FAMILY HISTORY: family history includes Breast cancer in his mother and sister; Cancer in his mother and sister.  SOCIAL HISTORY:  reports that he has been smoking cigarettes. He has never used smokeless tobacco. He reports that he does not currently use alcohol. He reports that he does not use drugs.  ALLERGIES: Patient has no known allergies.  MEDICATIONS:  Current Outpatient Medications  Medication Sig Dispense Refill   amLODipine  (NORVASC ) 5 MG tablet Take 1 tablet (5 mg total) by mouth in the morning.     aspirin  EC 81 MG tablet Take 81 mg by mouth daily. Swallow whole.     ezetimibe  (ZETIA ) 10 MG tablet Take 1 tablet (10 mg total) by mouth daily. 30 tablet 0   lidocaine -prilocaine  (EMLA ) cream Apply to affected area once 30 g 3   Multiple Vitamin (MULTIVITAMIN WITH MINERALS) TABS tablet Take 1 tablet by mouth daily. 30 tablet 12   ondansetron  (ZOFRAN ) 8 MG tablet Take 1 tablet (8 mg total) by mouth every 8 (eight) hours as needed for nausea or vomiting. Start on the third day after chemotherapy. 30 tablet 1   prochlorperazine  (COMPAZINE ) 10 MG tablet Take 1 tablet (10 mg total) by mouth every 6 (six) hours as needed for nausea or vomiting. 30 tablet 1   rosuvastatin  (CRESTOR ) 40 MG tablet Take 1 tablet (40 mg total) by mouth daily. 30 tablet 0   nicotine  (NICODERM CQ  - DOSED IN MG/24 HOURS) 21 mg/24hr patch Place 1 patch (21 mg total) onto the skin daily. (Patient not taking: Reported on 04/01/2024) 28 patch 0   No current facility-administered medications for this encounter.    ECOG PERFORMANCE  STATUS:  0 - Asymptomatic  REVIEW OF SYSTEMS: Patient has history of carotid artery atherosclerosis and multiple CVAs over the past Patient denies any weight loss, fatigue, weakness, fever, chills or night sweats. Patient denies any loss of vision, blurred vision. Patient denies any ringing  of the ears or hearing loss. No irregular heartbeat. Patient denies heart murmur or history of fainting. Patient denies any chest pain or pain radiating to her upper extremities. Patient denies any shortness of breath, difficulty breathing at night, cough or hemoptysis. Patient denies any swelling in the lower legs. Patient denies any nausea vomiting, vomiting of blood, or coffee ground material in the vomitus. Patient denies any stomach pain. Patient states has had normal bowel movements no significant constipation or diarrhea. Patient denies any dysuria, hematuria or significant nocturia. Patient denies any problems walking, swelling in the joints or loss of balance. Patient denies any skin changes, loss of hair or loss of weight. Patient denies any excessive worrying or anxiety or significant depression. Patient denies any problems with insomnia. Patient denies excessive thirst, polyuria, polydipsia. Patient denies any swollen glands, patient denies easy bruising or easy bleeding. Patient denies any recent infections, allergies or URI. Patient s visual fields have not changed significantly in recent time.   PHYSICAL EXAM:  BP 138/87   Pulse 84   Temp (!) 97 F (36.1 C)   Resp 16   Wt 139 lb (63 kg)   BMI 18.85 kg/m  Thin male in NAD.  Well-developed well-nourished patient in NAD. HEENT reveals PERLA, EOMI, discs not visualized.  Oral cavity is clear. No oral mucosal lesions are identified. Neck is clear without evidence of cervical or supraclavicular adenopathy. Lungs are clear to A&P. Cardiac examination is essentially unremarkable with regular rate and rhythm without murmur rub or thrill. Abdomen is benign  with no organomegaly or masses noted. Motor sensory and DTR levels are equal and symmetric in the upper and lower extremities. Cranial nerves II through XII are grossly intact. Proprioception is intact. No peripheral adenopathy or edema is identified. No motor or sensory levels are noted. Crude visual fields are within normal range.  LABORATORY DATA: Cytology and pathology reports reviewed    RADIOLOGY RESULTS: Brain MRI CT scans and PET CT scans all reviewed compatible with above-stated findings   IMPRESSION: Stage IIIb adenocarcinoma of the left lung in 58 year old male with history of multiple CVAs  PLAN: At this time patient will see thoracic surgery for consultation.  We also will perform PFT tests.  I have discussed with him neoadjuvant chemotherapy followed by surgery versus concurrent chemoradiation therapy.  I told him I believe the jury is out on overall difference in overall survival and in the era of immunotherapy concurrent chemoradiation plus immunotherapy has seen significant improvements in overall survival with this disease.  Patient will follow through with consultation with thoracic surgery as well as PFT tests.  I tentatively set him up for simulation after the first of the year should he opt or not be a candidate for neoadjuvant chemotherapy and surgery.  Patient comprehends my recommendations well.  Risks and benefits of radiation including possible cough skin reaction alteration of blood counts possible radiation esophagitis all were discussed in detail.  I would like to take this opportunity to thank you for allowing me to participate in the care of your patient.SABRA Marcey Penton, MD

## 2024-04-01 NOTE — Addendum Note (Signed)
 Addended by: VICCI EVALENE DEL on: 04/01/2024 08:26 AM   Modules accepted: Orders

## 2024-04-02 ENCOUNTER — Inpatient Hospital Stay

## 2024-04-02 NOTE — Progress Notes (Signed)
 [facility] Clinical Social Work  Clinical Social Work was referred by [facility] NP for assessment of psychosocial needs.  Clinical Social Work Intern contacted patient by phone to offer support and assess for needs.  Patient reports that although he is experiencing some anxiety he is focused on his treatment plan and how to move forward. Patient denies any material or emotional needs at this time, and offered appreciation for the call.   Interventions: Discussed common feeling and emotions when being diagnosed with cancer, and the importance of support during treatment Informed patient of the support team roles and support services at [facility] Provided CSW contact information and encouraged patient to call with any questions or concerns     Follow Up Plan:  Patient will contact CSW with any support or resource needs    Thersia KATHEE Daring  Clinical Social Work Intern [facility]

## 2024-04-03 ENCOUNTER — Ambulatory Visit: Admitting: Radiology

## 2024-04-06 ENCOUNTER — Other Ambulatory Visit (HOSPITAL_COMMUNITY): Payer: Self-pay | Admitting: Student

## 2024-04-08 ENCOUNTER — Other Ambulatory Visit: Payer: Self-pay

## 2024-04-08 ENCOUNTER — Ambulatory Visit
Admission: RE | Admit: 2024-04-08 | Discharge: 2024-04-08 | Disposition: A | Discharge status: Home / Self Care | Source: Ambulatory Visit | Attending: Internal Medicine | Admitting: Internal Medicine

## 2024-04-08 ENCOUNTER — Ambulatory Visit

## 2024-04-08 DIAGNOSIS — Z8673 Personal history of transient ischemic attack (TIA), and cerebral infarction without residual deficits: Secondary | ICD-10-CM | POA: Diagnosis not present

## 2024-04-08 DIAGNOSIS — F17210 Nicotine dependence, cigarettes, uncomplicated: Secondary | ICD-10-CM | POA: Diagnosis not present

## 2024-04-08 DIAGNOSIS — E785 Hyperlipidemia, unspecified: Secondary | ICD-10-CM | POA: Insufficient documentation

## 2024-04-08 DIAGNOSIS — C3412 Malignant neoplasm of upper lobe, left bronchus or lung: Secondary | ICD-10-CM | POA: Diagnosis present

## 2024-04-08 DIAGNOSIS — Z7982 Long term (current) use of aspirin: Secondary | ICD-10-CM | POA: Diagnosis not present

## 2024-04-08 DIAGNOSIS — F419 Anxiety disorder, unspecified: Secondary | ICD-10-CM | POA: Insufficient documentation

## 2024-04-08 DIAGNOSIS — Z720 Tobacco use: Secondary | ICD-10-CM

## 2024-04-08 DIAGNOSIS — Z79899 Other long term (current) drug therapy: Secondary | ICD-10-CM | POA: Insufficient documentation

## 2024-04-08 DIAGNOSIS — I10 Essential (primary) hypertension: Secondary | ICD-10-CM | POA: Insufficient documentation

## 2024-04-08 DIAGNOSIS — R0609 Other forms of dyspnea: Secondary | ICD-10-CM

## 2024-04-08 DIAGNOSIS — R0602 Shortness of breath: Secondary | ICD-10-CM

## 2024-04-08 DIAGNOSIS — R918 Other nonspecific abnormal finding of lung field: Secondary | ICD-10-CM

## 2024-04-08 HISTORY — PX: IR IMAGING GUIDED PORT INSERTION: IMG5740

## 2024-04-08 LAB — PULMONARY FUNCTION TEST
DL/VA % pred: 60 %
DL/VA: 2.59 ml/min/mmHg/L
DLCO unc % pred: 56 %
DLCO unc: 17.02 ml/min/mmHg
FEF 25-75 Post: 1.6 L/s
FEF 25-75 Pre: 1.21 L/s
FEF2575-%Change-Post: 31 %
FEF2575-%Pred-Post: 47 %
FEF2575-%Pred-Pre: 35 %
FEV1-%Change-Post: 13 %
FEV1-%Pred-Post: 60 %
FEV1-%Pred-Pre: 53 %
FEV1-Post: 2.42 L
FEV1-Pre: 2.14 L
FEV1FVC-%Change-Post: 3 %
FEV1FVC-%Pred-Pre: 73 %
FEV6-%Change-Post: 9 %
FEV6-%Pred-Post: 82 %
FEV6-%Pred-Pre: 75 %
FEV6-Post: 4.14 L
FEV6-Pre: 3.78 L
FEV6FVC-%Change-Post: 0 %
FEV6FVC-%Pred-Post: 104 %
FEV6FVC-%Pred-Pre: 103 %
FVC-%Change-Post: 8 %
FVC-%Pred-Post: 79 %
FVC-%Pred-Pre: 72 %
FVC-Post: 4.14 L
FVC-Pre: 3.8 L
Post FEV1/FVC ratio: 58 %
Post FEV6/FVC ratio: 100 %
Pre FEV1/FVC ratio: 56 %
Pre FEV6/FVC Ratio: 99 %
RV % pred: 181 %
RV: 4.13 L
TLC % pred: 109 %
TLC: 8.1 L

## 2024-04-08 MED ORDER — FENTANYL CITRATE (PF) 100 MCG/2ML IJ SOLN
INTRAMUSCULAR | Status: AC | PRN
  Administered 2024-04-08: 50 ug via INTRAVENOUS
  Administered 2024-04-08 (×2): 25 ug via INTRAVENOUS

## 2024-04-08 MED ORDER — LIDOCAINE-EPINEPHRINE 1 %-1:100000 IJ SOLN
20.0000 mL | Freq: Once | INTRAMUSCULAR | Status: AC
  Administered 2024-04-08: 20 mL via INTRADERMAL

## 2024-04-08 MED ORDER — LIDOCAINE-EPINEPHRINE 1 %-1:100000 IJ SOLN
INTRAMUSCULAR | Status: AC
  Filled 2024-04-08: qty 1

## 2024-04-08 MED ORDER — SODIUM CHLORIDE 0.9 % IV SOLN: INTRAVENOUS | Status: DC

## 2024-04-08 MED ORDER — HEPARIN SOD (PORK) LOCK FLUSH 100 UNIT/ML IV SOLN
INTRAVENOUS | Status: AC
  Filled 2024-04-08: qty 5

## 2024-04-08 MED ORDER — MIDAZOLAM HCL 2 MG/2ML IJ SOLN
INTRAMUSCULAR | Status: AC
  Filled 2024-04-08: qty 2

## 2024-04-08 MED ORDER — HEPARIN SOD (PORK) LOCK FLUSH 100 UNIT/ML IV SOLN
500.0000 [IU] | Freq: Once | INTRAVENOUS | Status: AC
  Administered 2024-04-08: 500 [IU] via INTRAVENOUS

## 2024-04-08 MED ORDER — FENTANYL CITRATE (PF) 100 MCG/2ML IJ SOLN
INTRAMUSCULAR | Status: AC
  Filled 2024-04-08: qty 2

## 2024-04-08 MED ORDER — MIDAZOLAM HCL (PF) 2 MG/2ML IJ SOLN
INTRAMUSCULAR | Status: AC | PRN
  Administered 2024-04-08: .5 mg via INTRAVENOUS
  Administered 2024-04-08: 1 mg via INTRAVENOUS
  Administered 2024-04-08: .5 mg via INTRAVENOUS

## 2024-04-08 NOTE — Patient Instructions (Signed)
 Full PFT completed today ? ?

## 2024-04-08 NOTE — H&P (Signed)
 "     Chief Complaint: Patient was seen in consultation today for adenocarcinoma of the left lung.  Referring Physician(s): Rennie Cindy SAUNDERS  Supervising Physician: Johann Sieving  Patient Status: [facility] - Out-pt  History of Present Illness: Henry Boone is a 58 y.o. 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 [name] was incidentally found to have a suspicious left upper lobe lung nodule in February of this year while undergoing CTA head/neck for evaluation of possible stroke. He was followed by pulmonology for this and eventually underwent bronchoscopy with biopsy on 03/23/24, pathology was consistent with adenocarcinoma. He was referred to radiation oncology with plans for possible radiation therapy pending CT surgery consultation. He was also referred to medical oncology who plan to begin systemic therapy. He presents to IR today for port placement prior to beginning systemic therapy.   Past Medical History:  Diagnosis Date   Abdominal aortic atherosclerosis    Anxiety    situational   Cancer (HCC) 2020   Complication of anesthesia    slow to wake up, anesthesia lingered for 24 hrs after , used a different anesthesia   Dyspnea    ETOH abuse    History of cerebral artery stenosis    Hyperlipidemia    Hypertension    Intracranial atherosclerosis    Pre-diabetes    Stroke [facility])     Past Surgical History:  Procedure Laterality Date   COLONOSCOPY WITH PROPOFOL  N/A 01/20/2019   Procedure: COLONOSCOPY WITH PROPOFOL ;  Surgeon: Toledo, Ladell POUR, MD;  Location: [facility] ENDOSCOPY;  Service: Gastroenterology;  Laterality: N/A;   ENDOBRONCHIAL ULTRASOUND Left 03/23/2024   Procedure: ENDOBRONCHIAL ULTRASOUND (EBUS);  Surgeon: Kasa, Kurian, MD;  Location: [facility] ORS;  Service: Pulmonary;  Laterality: Left;   IR ANGIO  EXTRACRAN SEL COM CAROTID INNOMINATE UNI BILAT MOD SED  07/23/2017   IR ANGIO INTRA EXTRACRAN SEL COM CAROTID INNOMINATE UNI R MOD SED  11/07/2017   IR ANGIO VERTEBRAL SEL SUBCLAVIAN INNOMINATE UNI R MOD SED  11/07/2017   IR ANGIO VERTEBRAL SEL VERTEBRAL BILAT MOD SED  07/23/2017   IR CT HEAD LTD  06/11/2023   IR CT HEAD LTD  06/11/2023   IR INTRA CRAN STENT  06/11/2023   IR PERCUTANEOUS ART THROMBECTOMY/INFUSION INTRACRANIAL INC DIAG ANGIO  06/11/2023   IR RADIOLOGIST EVAL & MGMT  06/28/2023   MICROLARYNGOSCOPY N/A 06/30/2019   Procedure: SUSPENSION MICROLARYNGOSCOPY WITH MICROFLAP;  Surgeon: Milissa Hamming, MD;  Location: [facility] ORS;  Service: ENT;  Laterality: N/A;   RADIOLOGY WITH ANESTHESIA N/A 11/07/2017   Procedure: STENTING;  Surgeon: Dolphus Carrion, MD;  Location: [facility] OR;  Service: Radiology;  Laterality: N/A;   RADIOLOGY WITH ANESTHESIA N/A 06/11/2023   Procedure: IR WITH ANESTHESIA;  Surgeon: Radiologist, Medication, MD;  Location: [facility] OR;  Service: Radiology;  Laterality: N/A;   TEE WITHOUT CARDIOVERSION N/A 11/15/2014   Procedure: TRANSESOPHAGEAL ECHOCARDIOGRAM (TEE);  Surgeon: Evalene JINNY Lunger, MD;  Location: [facility] ORS;  Service: Cardiovascular;  Laterality: N/A;   TONSILLECTOMY     VIDEO BRONCHOSCOPY WITH ENDOBRONCHIAL NAVIGATION Bilateral 03/23/2024   Procedure: VIDEO BRONCHOSCOPY WITH ENDOBRONCHIAL NAVIGATION;  Surgeon: Isaiah Scrivener, MD;  Location: [facility] ORS;  Service: Pulmonary;  Laterality: Bilateral;    Allergies: Patient has no known allergies.  Medications: Prior to Admission medications  Medication Sig Start Date End Date Taking? Authorizing  Provider  amLODipine  (NORVASC ) 5 MG tablet Take 1 tablet (5 mg total) by mouth in the morning. 10/07/23   Dennise Lavada POUR, MD  aspirin  EC 81 MG tablet Take 81 mg by mouth daily. Swallow whole.    [provider]  ezetimibe  (ZETIA ) 10 MG tablet Take 1 tablet (10 mg total) by mouth daily. 10/05/23   Singh, Prashant K,  MD  lidocaine -prilocaine  (EMLA ) cream Apply to affected area once 03/30/24   Brahmanday, Govinda R, MD  Multiple Vitamin (MULTIVITAMIN WITH MINERALS) TABS tablet Take 1 tablet by mouth daily. 11/15/14   Hope Dorothyann SQUIBB, MD  nicotine  (NICODERM CQ  - DOSED IN MG/24 HOURS) 21 mg/24hr patch Place 1 patch (21 mg total) onto the skin daily. Patient not taking: Reported on 04/01/2024 03/03/24   Kasa, Kurian, MD  ondansetron  (ZOFRAN ) 8 MG tablet Take 1 tablet (8 mg total) by mouth every 8 (eight) hours as needed for nausea or vomiting. Start on the third day after chemotherapy. 03/30/24   Brahmanday, Govinda R, MD  prochlorperazine  (COMPAZINE ) 10 MG tablet Take 1 tablet (10 mg total) by mouth every 6 (six) hours as needed for nausea or vomiting. 03/30/24   Brahmanday, Govinda R, MD  rosuvastatin  (CRESTOR ) 40 MG tablet Take 1 tablet (40 mg total) by mouth daily. 10/05/23   Dennise Lavada POUR, MD     Family History  Problem Relation Age of Onset   Breast cancer Mother    Cancer Mother    Breast cancer Sister    Cancer Sister     Social History   Socioeconomic History   Marital status: Significant Other    Spouse name: HenryStephanie (Significant other)   Number of children: Not on file   Years of education: Not on file   Highest education level: Not on file  Occupational History   Not on file  Tobacco Use   Smoking status: Some Days    Current packs/day: 0.00    Types: Cigarettes    Last attempt to quit: 10/10/2023    Years since quitting: 0.4   Smokeless tobacco: Never   Tobacco comments:    Smokes 1-2 cigarettes occasionally when the cravings increase. Has requested a refill on nicotine  patches to use instead.  Vaping Use   Vaping status: Never Used  Substance and Sexual Activity   Alcohol use: Not Currently   Drug use: Never   Sexual activity: Yes  Other Topics Concern   Not on file  Social History Narrative   Not on file   Social Drivers of Health   Tobacco Use: High Risk  (04/01/2024)   Patient History    Smoking Tobacco Use: Some Days    Smokeless Tobacco Use: Never    Passive Exposure: Not on file  Financial Resource Strain: Low Risk  (04/11/2023)   Received [facility]   Overall Financial Resource Strain (CARDIA)    Difficulty of Paying Living Expenses: Not hard at all  Food Insecurity: No Food Insecurity (02/26/2024)   Epic    Worried About Radiation Protection Practitioner of Food in the Last Year: Never true    Ran Out of Food in the Last Year: Never true  Transportation Needs: No Transportation Needs (02/26/2024)   Epic    Lack of Transportation (Medical): No    Lack of Transportation (Non-Medical): No  Physical Activity: Not on file  Stress: Not on file  Social Connections: Not on file  Depression (PHQ2-9): Low Risk (04/01/2024)   Depression (PHQ2-9)  PHQ-2 Score: 0  Alcohol Screen: Not on file  Housing: Low Risk (02/26/2024)   Epic    Unable to Pay for Housing in the Last Year: No    Number of Times Moved in the Last Year: 0    Homeless in the Last Year: No  Utilities: Not At Risk (02/26/2024)   Epic    Threatened with loss of utilities: No  Health Literacy: Not on file     Review of Systems: A 12 point ROS discussed and pertinent positives are indicated in the HPI above.  All other systems are negative.  Review of Systems  Constitutional:  Negative for chills and fever.  Respiratory:  Negative for cough and shortness of breath.   Cardiovascular:  Negative for chest pain.  Gastrointestinal:  Negative for abdominal pain, diarrhea, nausea and vomiting.  Musculoskeletal:  Positive for back pain (chronic, acutely worsened recently with bending at home).  Neurological:  Negative for dizziness and headaches.    Vital Signs: BP (!) 169/114   Pulse 91   Temp 97.9 F (36.6 C) (Oral)   Resp (!) 27   Ht 6' (1.829 m)   Wt 137 lb 12.6 oz (62.5 kg)   SpO2 99%   BMI 18.69 kg/m   Physical Exam Vitals reviewed.  Constitutional:       General: He is not in acute distress. HENT:     Head: Normocephalic.     Mouth/Throat:     Mouth: Mucous membranes are moist.     Pharynx: Oropharynx is clear. No oropharyngeal exudate or posterior oropharyngeal erythema.  Cardiovascular:     Rate and Rhythm: Normal rate and regular rhythm.  Pulmonary:     Effort: Pulmonary effort is normal.     Breath sounds: Normal breath sounds.  Abdominal:     General: There is no distension.     Palpations: Abdomen is soft.     Tenderness: There is no abdominal tenderness.  Skin:    General: Skin is warm and dry.  Neurological:     Mental Status: He is alert and oriented to person, place, and time.  Psychiatric:        Mood and Affect: Mood normal.        Behavior: Behavior normal.        Judgment: Judgment normal.      MD Evaluation Airway: WNL Heart: WNL Abdomen: WNL Chest/ Lungs: WNL ASA  Classification: 3 Mallampati/Airway Score: Two   Imaging: CT SUPER D CHEST WO MONARCH PILOT Result Date: 03/23/2024 CLINICAL DATA:  Lung mass, surgical planning. EXAM: CT CHEST WITHOUT CONTRAST TECHNIQUE: Multidetector CT imaging of the chest was performed using thin slice collimation for electromagnetic bronchoscopy planning purposes, without intravenous contrast. RADIATION DOSE REDUCTION: This exam was performed according to the departmental dose-optimization program which includes automated exposure control, adjustment of the mA and/or kV according to patient size and/or use of iterative reconstruction technique. COMPARISON:  Chest CT 06/11/2023.  PET-CT 03/03/2024. FINDINGS: Cardiovascular: Atherosclerosis of the aorta, great vessels and coronary arteries. The heart size is normal. Stable mild pericardial thickening versus small pericardial effusion. Mediastinum/Nodes: Compared with the previous diagnostic CT, there has been interval enlargement of an AP window node which measures 1.4 cm short axis on image 74/2, corresponding with a  hypermetabolic node on PET-CT. There is also progressive enlargement of the left hilum, corresponding with a hypermetabolic left hilar lymph node. No other enlarged mediastinal, hilar or axillary lymph nodes are demonstrated on noncontrast imaging. Small hiatal  hernia. Lungs/Pleura: Moderate to severe centrilobular and paraseptal emphysema. There is no pleural effusion or pneumothorax. The hypermetabolic left apical nodule measures 2.3 x 1.1 cm on image 37/4 (1.5 x 1.0 cm on previous CT). Part solid and cystic right upper lobe lesion measures 2.4 x 1.4 cm on image 40/4, not significantly changed. Part solid density associated with subpleural paraseptal emphysematous changes posteriorly in the right upper lobe appears unchanged, best seen on sagittal image 69/6. Other smaller nodules are grossly stable, including an approximately 4 mm part solid nodule posteriorly in the right upper lobe (image 74/4) and a tiny left lower lobe nodule on image 107/4. No other enlarging nodules identified. Upper abdomen: The visualized upper abdomen appears stable, without acute findings. Musculoskeletal/Chest wall: There is no chest wall mass or suspicious osseous finding. IMPRESSION: 1. Imaging for surgical planning. 2. Since previous chest CT of 10 months ago, interval enlargement of left apical nodule and left hilar and AP window lymph nodes, hypermetabolic on PET-CT, consistent with progressive metastatic disease. 3. Other pulmonary nodules are grossly stable. Recommend attention on follow-up, especially the part solid right apical lesion. 4. No other evidence of metastatic disease. 5. Aortic Atherosclerosis (ICD10-I70.0) and Emphysema (ICD10-J43.9). Electronically Signed   By: Elsie Perone M.D.   On: 03/23/2024 13:40   DG Chest Port 1 View Result Date: 03/23/2024 CLINICAL DATA:  Post bronchoscopy. EXAM: PORTABLE CHEST 1 VIEW COMPARISON:  Chest radiographs 02/24/2024 and 07/23/2017. Chest CT 03/20/2024. FINDINGS: 1253 hours.  Two views submitted. The heart size and mediastinal contours are stable. Known left apical nodule appears less well-defined and larger, likely secondary to mild hemorrhage following presumed biopsy. There is no confluent airspace disease, pneumothorax or significant pleural effusion. The bones appear unchanged. IMPRESSION: 1. No evidence of pneumothorax following bronchoscopy. 2. Suspected mild hemorrhage surrounding the known left upper lobe nodule. Electronically Signed   By: Elsie Perone M.D.   On: 03/23/2024 13:27   DG C-Arm 1-60 Min-No Report Result Date: 03/23/2024 Fluoroscopy was utilized by the requesting physician.  No radiographic interpretation.    Labs:  CBC: Recent Labs    06/13/23 0527 10/04/23 0923 10/05/23 0752 02/24/24 0810  WBC 8.8 10.1 9.2 9.6  HGB 15.0 14.8 15.7 16.1  HCT 42.1 44.6 46.7 48.3  PLT 259 382 378 344    COAGS: Recent Labs    06/11/23 0746 10/04/23 0923 02/24/24 0810  INR 1.0 1.0 1.0  APTT 31 29 31     BMP: Recent Labs    06/13/23 0527 10/04/23 0923 10/05/23 0752 02/24/24 0810  NA 134* 137 138 139  K 3.7 4.0 3.7 3.7  CL 100 103 104 104  CO2 23 24 29 24   GLUCOSE 100* 139* 107* 133*  BUN 12 11 8 8   CALCIUM  9.0 9.3 9.3 9.2  CREATININE 0.92 0.68 0.91 0.81  GFRNONAA >60 >60 >60 >60    LIVER FUNCTION TESTS: Recent Labs    06/11/23 0746 10/04/23 0923 10/05/23 0752 02/24/24 0810  BILITOT 0.5 0.6 0.6 0.9  AST 17 16 15 21   ALT 10 13 14 20   ALKPHOS 60 66 63 70  PROT 7.2 6.7 6.4* 7.3  ALBUMIN  4.2 4.0 3.7 4.0    TUMOR MARKERS: No results for input(s): AFPTM, CEA, CA199, CHROMGRNA in the last 8760 hours.  Assessment and Plan:  58 y/o M with recently diagnosed left upper lobe lung adenocarcinoma who presents today for port placement prior to beginning systemic therapy.   Risks and benefits of image-guided Port-a-catheter placement  were discussed with the patient including, but not limited to bleeding, infection,  pneumothorax, or fibrin sheath development and need for additional procedures.  All of the patient's questions were answered, patient is agreeable to proceed.  Consent signed and in chart.  Thank you for this interesting consult.  I greatly enjoyed meeting TILMAN MCCLAREN and look forward to participating in their care.  A copy of this report was sent to the requesting provider on this date.  Electronically Signed: Clotilda DELENA Hesselbach, PA-C 04/08/2024, 8:36 AM   I spent a total of 30 Minutes  in face to face in clinical consultation, greater than 50% of which was counseling/coordinating care for adenocarcinoma of the left lung.  "

## 2024-04-08 NOTE — Progress Notes (Signed)
 Full PFT completed today ? ?

## 2024-04-08 NOTE — Procedures (Signed)
" °  Procedure:  R internal jugular port catheter placement   Preprocedure diagnosis: The encounter diagnosis was Primary cancer of left upper lobe of lung (HCC). Postprocedure diagnosis: same EBL:    minimal Complications:   none immediate  See full dictation in [employer].  CHARM Toribio Faes MD Main # [phone] Pager  [phone] Mobile [phone]    "

## 2024-04-17 ENCOUNTER — Encounter: Payer: Self-pay | Admitting: Internal Medicine

## 2024-04-17 ENCOUNTER — Encounter: Payer: Self-pay | Admitting: Thoracic Surgery (Cardiothoracic Vascular Surgery)

## 2024-04-17 ENCOUNTER — Ambulatory Visit: Admitting: Thoracic Surgery (Cardiothoracic Vascular Surgery)

## 2024-04-17 VITALS — BP 136/84 | HR 85 | Resp 18 | Ht 72.0 in | Wt 141.0 lb

## 2024-04-20 DIAGNOSIS — C3412 Malignant neoplasm of upper lobe, left bronchus or lung: Secondary | ICD-10-CM

## 2024-04-21 ENCOUNTER — Other Ambulatory Visit: Payer: Self-pay | Admitting: Internal Medicine

## 2024-04-21 ENCOUNTER — Ambulatory Visit
Admission: RE | Admit: 2024-04-21 | Discharge: 2024-04-21 | Disposition: A | Discharge status: Home / Self Care | Payer: Self-pay | Source: Ambulatory Visit | Attending: Radiation Oncology | Admitting: Radiation Oncology

## 2024-04-21 ENCOUNTER — Encounter: Payer: Self-pay | Admitting: *Deleted

## 2024-04-21 DIAGNOSIS — C3412 Malignant neoplasm of upper lobe, left bronchus or lung: Secondary | ICD-10-CM | POA: Insufficient documentation

## 2024-04-21 DIAGNOSIS — Z510 Encounter for antineoplastic radiation therapy: Secondary | ICD-10-CM | POA: Insufficient documentation

## 2024-04-21 DIAGNOSIS — F17210 Nicotine dependence, cigarettes, uncomplicated: Secondary | ICD-10-CM | POA: Insufficient documentation

## 2024-04-23 NOTE — Progress Notes (Signed)
 Pharmacist Chemotherapy Monitoring - Initial Assessment    Anticipated start date: 04/30/24   The following has been reviewed per standard work regarding the patient's treatment regimen: The patient's diagnosis, treatment plan and drug doses, and organ/hematologic function Lab orders and baseline tests specific to treatment regimen  The treatment plan start date, drug sequencing, and pre-medications Prior authorization status  Patient's documented medication list, including drug-drug interaction screen and prescriptions for anti-emetics and supportive care specific to the treatment regimen The drug concentrations, fluid compatibility, administration routes, and timing of the medications to be used The patient's access for treatment and lifetime cumulative dose history, if applicable  The patient's medication allergies and previous infusion related reactions, if applicable  Primary cancer of left upper lobe of lung (HCC)  concurrent chemoradiation [weekly carbotaxol with the daily radiation/Monday through Friday  Changes made to treatment plan:  N/A  Follow up needed:  Pending authorization for treatment    Henry Boone, RPH, 04/23/2024  2:06 PM

## 2024-04-24 ENCOUNTER — Other Ambulatory Visit: Payer: Self-pay | Admitting: *Deleted

## 2024-04-27 ENCOUNTER — Encounter: Payer: Self-pay | Admitting: *Deleted

## 2024-04-28 ENCOUNTER — Telehealth: Payer: Self-pay | Admitting: *Deleted

## 2024-04-28 NOTE — Telephone Encounter (Signed)
 [employer] disability form signed and faxed to [phone].

## 2024-04-29 ENCOUNTER — Ambulatory Visit
Admission: RE | Admit: 2024-04-29 | Discharge: 2024-04-29 | Disposition: A | Discharge status: Home / Self Care | Source: Ambulatory Visit | Attending: Radiation Oncology | Admitting: Radiation Oncology

## 2024-04-30 ENCOUNTER — Other Ambulatory Visit: Payer: Self-pay

## 2024-04-30 ENCOUNTER — Inpatient Hospital Stay

## 2024-04-30 ENCOUNTER — Ambulatory Visit
Admission: RE | Admit: 2024-04-30 | Discharge: 2024-04-30 | Disposition: A | Source: Ambulatory Visit | Attending: Radiation Oncology | Admitting: Radiation Oncology

## 2024-04-30 ENCOUNTER — Encounter: Payer: Self-pay | Admitting: *Deleted

## 2024-04-30 ENCOUNTER — Inpatient Hospital Stay: Admitting: Internal Medicine

## 2024-04-30 ENCOUNTER — Encounter: Payer: Self-pay | Admitting: Internal Medicine

## 2024-04-30 VITALS — BP 133/96 | HR 84 | Temp 95.6°F | Resp 18 | Ht 72.0 in | Wt 136.9 lb

## 2024-04-30 VITALS — BP 117/74 | HR 79 | Temp 97.3°F | Resp 19

## 2024-04-30 DIAGNOSIS — F17210 Nicotine dependence, cigarettes, uncomplicated: Secondary | ICD-10-CM | POA: Insufficient documentation

## 2024-04-30 DIAGNOSIS — C3412 Malignant neoplasm of upper lobe, left bronchus or lung: Secondary | ICD-10-CM

## 2024-04-30 DIAGNOSIS — Z5111 Encounter for antineoplastic chemotherapy: Secondary | ICD-10-CM | POA: Insufficient documentation

## 2024-04-30 LAB — CBC WITH DIFFERENTIAL (CANCER CENTER ONLY)
Abs Immature Granulocytes: 0.02 K/uL (ref 0.00–0.07)
Basophils Absolute: 0.1 K/uL (ref 0.0–0.1)
Basophils Relative: 1 %
Eosinophils Absolute: 0.1 K/uL (ref 0.0–0.5)
Eosinophils Relative: 2 %
HCT: 43.9 % (ref 39.0–52.0)
Hemoglobin: 14.8 g/dL (ref 13.0–17.0)
Immature Granulocytes: 0 %
Lymphocytes Relative: 21 %
Lymphs Abs: 1.8 K/uL (ref 0.7–4.0)
MCH: 29.7 pg (ref 26.0–34.0)
MCHC: 33.7 g/dL (ref 30.0–36.0)
MCV: 88 fL (ref 80.0–100.0)
Monocytes Absolute: 0.7 K/uL (ref 0.1–1.0)
Monocytes Relative: 8 %
Neutro Abs: 5.9 K/uL (ref 1.7–7.7)
Neutrophils Relative %: 68 %
Platelet Count: 299 K/uL (ref 150–400)
RBC: 4.99 MIL/uL (ref 4.22–5.81)
RDW: 12.6 % (ref 11.5–15.5)
WBC Count: 8.6 K/uL (ref 4.0–10.5)
nRBC: 0 % (ref 0.0–0.2)

## 2024-04-30 LAB — RAD ONC ARIA SESSION SUMMARY
Course Elapsed Days: 0
Plan Fractions Treated to Date: 1
Plan Prescribed Dose Per Fraction: 2 Gy
Plan Total Fractions Prescribed: 33
Plan Total Prescribed Dose: 66 Gy
Reference Point Dosage Given to Date: 2 Gy
Reference Point Session Dosage Given: 2 Gy
Session Number: 1

## 2024-04-30 LAB — CMP (CANCER CENTER ONLY)
ALT: 21 U/L (ref 0–44)
AST: 23 U/L (ref 15–41)
Albumin: 4.4 g/dL (ref 3.5–5.0)
Alkaline Phosphatase: 78 U/L (ref 38–126)
Anion gap: 10 (ref 5–15)
BUN: 6 mg/dL (ref 6–20)
CO2: 26 mmol/L (ref 22–32)
Calcium: 9.9 mg/dL (ref 8.9–10.3)
Chloride: 104 mmol/L (ref 98–111)
Creatinine: 0.83 mg/dL (ref 0.61–1.24)
GFR, Estimated: >60 mL/min
Glucose, Bld: 93 mg/dL (ref 70–99)
Potassium: 4.1 mmol/L (ref 3.5–5.1)
Sodium: 140 mmol/L (ref 135–145)
Total Bilirubin: 0.5 mg/dL (ref 0.0–1.2)
Total Protein: 6.7 g/dL (ref 6.5–8.1)

## 2024-04-30 MED ORDER — MONTELUKAST SODIUM 10 MG PO TABS
10.0000 mg | ORAL_TABLET | Freq: Once | ORAL | Status: AC
  Administered 2024-04-30: 10 mg via ORAL
  Filled 2024-04-30: qty 1

## 2024-04-30 MED ORDER — SODIUM CHLORIDE 0.9 % IV SOLN
225.4000 mg | Freq: Once | INTRAVENOUS | Status: AC
  Administered 2024-04-30: 230 mg via INTRAVENOUS
  Filled 2024-04-30: qty 23

## 2024-04-30 MED ORDER — DIPHENHYDRAMINE HCL 50 MG/ML IJ SOLN
50.0000 mg | Freq: Once | INTRAMUSCULAR | Status: AC
  Administered 2024-04-30: 50 mg via INTRAVENOUS
  Filled 2024-04-30: qty 1

## 2024-04-30 MED ORDER — DEXAMETHASONE SOD PHOSPHATE PF 10 MG/ML IJ SOLN
10.0000 mg | Freq: Once | INTRAMUSCULAR | Status: AC
  Administered 2024-04-30: 10 mg via INTRAVENOUS
  Filled 2024-04-30: qty 1

## 2024-04-30 MED ORDER — SODIUM CHLORIDE 0.9 % IV SOLN
Freq: Once | INTRAVENOUS | Status: DC | PRN
  Filled 2024-04-30: qty 250

## 2024-04-30 MED ORDER — PALONOSETRON HCL INJECTION 0.25 MG/5ML
0.2500 mg | Freq: Once | INTRAVENOUS | Status: AC
  Administered 2024-04-30: 0.25 mg via INTRAVENOUS
  Filled 2024-04-30: qty 5

## 2024-04-30 MED ORDER — FAMOTIDINE IN NACL 20-0.9 MG/50ML-% IV SOLN
20.0000 mg | Freq: Once | INTRAVENOUS | Status: AC
  Administered 2024-04-30: 20 mg via INTRAVENOUS
  Filled 2024-04-30: qty 50

## 2024-04-30 MED ORDER — FAMOTIDINE IN NACL 20-0.9 MG/50ML-% IV SOLN: 20.0000 mg | Freq: Once | INTRAVENOUS | Status: DC | PRN

## 2024-04-30 MED ORDER — SODIUM CHLORIDE 0.9 % IV SOLN
INTRAVENOUS | Status: DC
  Filled 2024-04-30: qty 250

## 2024-04-30 MED ORDER — SODIUM CHLORIDE 0.9 % IV SOLN
45.0000 mg/m2 | Freq: Once | INTRAVENOUS | Status: AC
  Administered 2024-04-30: 78 mg via INTRAVENOUS
  Filled 2024-04-30: qty 13

## 2024-04-30 NOTE — Assessment & Plan Note (Addendum)
#   NOV-DEC 2025-incidental.  CTA head and neck - Advanced emphysema with a slowly enlarging left upper lobe lung nodule now up to 2.1 cm; 1. Lung, biopsy, LUL :  CARCINOMA CONSISTENT WITH ADENOCARCINOMA [Dr.Kasa]. NOV 2025-  Hypermetabolic left upper lobe nodule compatible with malignancy;  Hypermetabolic left hilar and mediastinal adenopathy compatible with malignancy. Subsolid right upper lobe nodule with low-grade metabolic. Stage III- T1N2 Vs Stage ? IV [contralateral]. MRI brain- NOV 2025- NEG for metastases. S/p  Dr. Kerrin- NOt surgical candaiate. . Carbo-taxol  weekly- RT- adjuvant immunotherapy.  # proceed with cycle #1 of planned 6-8 weeks with concurrent RT. Labs-CBC/chemistries were reviewed with the patient.  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  As per patient is not symptomatic.  He also states that he has never been told to have emphysema.     # Hx of stroke [Dr.Heck; [place]]-on Brilinta  plus aspirin .  Last stroke as per patient June 2025-chronic tingling and numbness of the extremities residual from stroke- stable.   # Nutrition/speech path re: cancer/concern for malnutrition   # Active smoker: Patient has been trying to quit smoking.  Weekly-MD/labs- chemo- # DISPOSITION:  # chem today- # follow up  as per IS- -Dr.B

## 2024-04-30 NOTE — Progress Notes (Signed)
 Port placed and PFT 04/08/24.

## 2024-04-30 NOTE — Patient Instructions (Signed)

## 2024-04-30 NOTE — Progress Notes (Signed)
 [facility] CONSULT NOTE  Patient Care Team: [name], MD as PCP - General (Family Medicine) Verdene Gills, RN as Oncology Nurse Navigator Rennie Cindy SAUNDERS, MD as Consulting Physician (Oncology)  CHIEF COMPLAINTS/PURPOSE OF CONSULTATION: Lung cancer  Oncology History  Primary cancer of left upper lobe of lung (HCC)  03/30/2024 Initial Diagnosis   Primary cancer of left upper lobe of lung (HCC)   03/30/2024 Cancer Staging   Staging form: Lung, AJCC V9 - Clinical: Stage IIIB (cT2a, cN2b, cM0) - Signed by Rennie Cindy SAUNDERS, MD on 03/30/2024   04/30/2024 -  Chemotherapy   Patient is on Treatment Plan : LUNG Carboplatin  + Paclitaxel  + XRT q7d        HISTORY OF PRESENTING ILLNESS:  Lynwood SHAUNNA Angus 59 y.o.  male with a history of active smoking, history of stroke-  i is here to review the results of his workup for lung cancer.  Discussed the use of AI scribe software for clinical note transcription with the patient, who gave verbal consent to proceed.  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  He has had a port placed and reports no significant pain or complications related to the port. He has received training on port care and reports no significant pain or complications related to the port.  He expresses concern regarding potential adverse effects of therapy, particularly nausea and anorexia. He identifies decreased appetite as his most significant challenge, noting chronic difficulty with weight gain and further reduction in appetite over the past year. His current weight is 172 lbs with 15% body fat. He has been prescribed antiemetics but has not required their use.  He currently feels better than he has in the past six to eight months and is optimistic that treatment will not significantly  worsen his condition. He demonstrates good pain tolerance and has prior experience with antiemetics following general anesthesia, though he has not needed them recently.      Review of Systems  Constitutional:  Positive for malaise/fatigue. Negative for chills, diaphoresis, fever and weight loss.  HENT:  Negative for nosebleeds and sore throat.   Eyes:  Negative for double vision.  Respiratory:  Positive for cough and shortness of breath. Negative for hemoptysis, sputum production and wheezing.   Cardiovascular:  Negative for chest pain, palpitations, orthopnea and leg swelling.  Gastrointestinal:  Negative for abdominal pain, blood in stool, constipation, diarrhea, heartburn, melena, nausea and vomiting.  Genitourinary:  Negative for dysuria, frequency and urgency.  Musculoskeletal:  Positive for back pain and joint pain.  Skin: Negative.  Negative for itching and rash.  Neurological:  Negative for dizziness, tingling, focal weakness, weakness and headaches.  Endo/Heme/Allergies:  Does not bruise/bleed easily.  Psychiatric/Behavioral:  Negative for depression. The patient is not nervous/anxious and does not have insomnia.     MEDICAL HISTORY:  Past Medical History:  Diagnosis Date   Abdominal aortic atherosclerosis    Anxiety    situational   Cancer (HCC) 2020   Complication of anesthesia    slow to wake up, anesthesia lingered for 24 hrs after , used a different anesthesia   Dyspnea    ETOH abuse    History of cerebral artery stenosis    Hyperlipidemia    Hypertension    Intracranial atherosclerosis  Pre-diabetes    Stroke [facility])     SURGICAL HISTORY: Past Surgical History:  Procedure Laterality Date   COLONOSCOPY WITH PROPOFOL  N/A 01/20/2019   Procedure: COLONOSCOPY WITH PROPOFOL ;  Surgeon: Toledo, Ladell POUR, MD;  Location: [facility] ENDOSCOPY;  Service: Gastroenterology;  Laterality: N/A;   ENDOBRONCHIAL ULTRASOUND Left 03/23/2024   Procedure: ENDOBRONCHIAL ULTRASOUND  (EBUS);  Surgeon: Kasa, Kurian, MD;  Location: [facility] ORS;  Service: Pulmonary;  Laterality: Left;   IR ANGIO EXTRACRAN SEL COM CAROTID INNOMINATE UNI BILAT MOD SED  07/23/2017   IR ANGIO INTRA EXTRACRAN SEL COM CAROTID INNOMINATE UNI R MOD SED  11/07/2017   IR ANGIO VERTEBRAL SEL SUBCLAVIAN INNOMINATE UNI R MOD SED  11/07/2017   IR ANGIO VERTEBRAL SEL VERTEBRAL BILAT MOD SED  07/23/2017   IR CT HEAD LTD  06/11/2023   IR CT HEAD LTD  06/11/2023   IR IMAGING GUIDED PORT INSERTION  04/08/2024   IR INTRA CRAN STENT  06/11/2023   IR PERCUTANEOUS ART THROMBECTOMY/INFUSION INTRACRANIAL INC DIAG ANGIO  06/11/2023   IR RADIOLOGIST EVAL & MGMT  06/28/2023   MICROLARYNGOSCOPY N/A 06/30/2019   Procedure: SUSPENSION MICROLARYNGOSCOPY WITH MICROFLAP;  Surgeon: Milissa Hamming, MD;  Location: [facility] ORS;  Service: ENT;  Laterality: N/A;   RADIOLOGY WITH ANESTHESIA N/A 11/07/2017   Procedure: STENTING;  Surgeon: Dolphus Carrion, MD;  Location: [facility] OR;  Service: Radiology;  Laterality: N/A;   RADIOLOGY WITH ANESTHESIA N/A 06/11/2023   Procedure: IR WITH ANESTHESIA;  Surgeon: Radiologist, Medication, MD;  Location: [facility] OR;  Service: Radiology;  Laterality: N/A;   TEE WITHOUT CARDIOVERSION N/A 11/15/2014   Procedure: TRANSESOPHAGEAL ECHOCARDIOGRAM (TEE);  Surgeon: Evalene JINNY Lunger, MD;  Location: [facility] ORS;  Service: Cardiovascular;  Laterality: N/A;   TONSILLECTOMY     VIDEO BRONCHOSCOPY WITH ENDOBRONCHIAL NAVIGATION Bilateral 03/23/2024   Procedure: VIDEO BRONCHOSCOPY WITH ENDOBRONCHIAL NAVIGATION;  Surgeon: Isaiah Scrivener, MD;  Location: [facility] ORS;  Service: Pulmonary;  Laterality: Bilateral;    SOCIAL HISTORY: Social History   Socioeconomic History   Marital status: Significant Other    Spouse name: [name],[name] (Significant other)   Number of children: Not on file   Years of education: Not on file   Highest education level: Not on file  Occupational History   Not on file  Tobacco Use   Smoking  status: Some Days    Current packs/day: 0.00    Types: Cigarettes    Last attempt to quit: 10/10/2023    Years since quitting: 0.5   Smokeless tobacco: Never   Tobacco comments:    Smokes 1-2 cigarettes occasionally when the cravings increase. Has requested a refill on nicotine  patches to use instead.  Vaping Use   Vaping status: Never Used  Substance and Sexual Activity   Alcohol use: Not Currently   Drug use: Never   Sexual activity: Yes  Other Topics Concern   Not on file  Social History Narrative   Not on file   Social Drivers of Health   Tobacco Use: High Risk (04/30/2024)   Patient History    Smoking Tobacco Use: Some Days    Smokeless Tobacco Use: Never    Passive Exposure: Not on file  Financial Resource Strain: Low Risk  (04/11/2023)   Received from [facility]   Overall Financial Resource Strain (CARDIA)    Difficulty of Paying Living Expenses: Not hard at all  Food Insecurity: No Food Insecurity (02/26/2024)   Epic    Worried About Programme Researcher, Broadcasting/film/video in  the Last Year: Never true    Ran Out of Food in the Last Year: Never true  Transportation Needs: No Transportation Needs (02/26/2024)   Epic    Lack of Transportation (Medical): No    Lack of Transportation (Non-Medical): No  Physical Activity: Not on file  Stress: Not on file  Social Connections: Not on file  Intimate Partner Violence: Not At Risk (02/26/2024)   Epic    Fear of Current or Ex-Partner: No    Emotionally Abused: No    Physically Abused: No    Sexually Abused: No  Depression (PHQ2-9): Low Risk (04/30/2024)   Depression (PHQ2-9)    PHQ-2 Score: 0  Alcohol Screen: Not on file  Housing: Low Risk (02/26/2024)   Epic    Unable to Pay for Housing in the Last Year: No    Number of Times Moved in the Last Year: 0    Homeless in the Last Year: No  Utilities: Not At Risk (02/26/2024)   Epic    Threatened with loss of utilities: No  Health Literacy: Not on file    FAMILY  HISTORY: Family History  Problem Relation Age of Onset   Breast cancer Mother    Cancer Mother    Breast cancer Sister    Cancer Sister     ALLERGIES:  has no known allergies.  MEDICATIONS:  Current Outpatient Medications  Medication Sig Dispense Refill   amLODipine  (NORVASC ) 5 MG tablet Take 1 tablet (5 mg total) by mouth in the morning.     aspirin  EC 81 MG tablet Take 81 mg by mouth daily. Swallow whole.     ezetimibe  (ZETIA ) 10 MG tablet Take 1 tablet (10 mg total) by mouth daily. 30 tablet 0   lidocaine -prilocaine  (EMLA ) cream Apply to affected area once 30 g 3   Multiple Vitamin (MULTIVITAMIN WITH MINERALS) TABS tablet Take 1 tablet by mouth daily. 30 tablet 12   nicotine  (NICODERM CQ  - DOSED IN MG/24 HOURS) 21 mg/24hr patch Place 1 patch (21 mg total) onto the skin daily. 28 patch 0   ondansetron  (ZOFRAN ) 8 MG tablet Take 1 tablet (8 mg total) by mouth every 8 (eight) hours as needed for nausea or vomiting. Start on the third day after chemotherapy. 30 tablet 1   prochlorperazine  (COMPAZINE ) 10 MG tablet Take 1 tablet (10 mg total) by mouth every 6 (six) hours as needed for nausea or vomiting. 30 tablet 1   rosuvastatin  (CRESTOR ) 40 MG tablet Take 1 tablet (40 mg total) by mouth daily. 30 tablet 0   No current facility-administered medications for this visit.   Facility-Administered Medications Ordered in Other Visits  Medication Dose Route Frequency Provider Last Rate Last Admin   0.9 %  sodium chloride  infusion   Intravenous Continuous Tashema Tiller R, MD 10 mL/hr at 04/30/24 0957 New Bag at 04/30/24 0957   CARBOplatin  (PARAPLATIN ) 230 mg in sodium chloride  0.9 % 100 mL chemo infusion  230 mg Intravenous Once Peyten Weare R, MD       diphenhydrAMINE  (BENADRYL ) injection 50 mg  50 mg Intravenous Once Shimon Trowbridge R, MD       famotidine  (PEPCID ) IVPB 20 mg premix  20 mg Intravenous Once Olsen Mccutchan R, MD 200 mL/hr at 04/30/24 1002 20 mg at 04/30/24  1002   PACLitaxel  (TAXOL ) 78 mg in sodium chloride  0.9 % 250 mL chemo infusion (</= 80mg /m2)  45 mg/m2 (Treatment Plan Recorded) Intravenous Once Coden Franchi R, MD  PHYSICAL EXAMINATION:  Vitals:   04/30/24 0829 04/30/24 0856  BP: (!) 143/92 (!) 133/96  Pulse: 84   Resp: 18   Temp: (!) 95.6 F (35.3 C)   SpO2: 100%     Filed Weights   04/30/24 0829  Weight: 136 lb 14.4 oz (62.1 kg)     Physical Exam Vitals and nursing note reviewed.  HENT:     Head: Normocephalic and atraumatic.     Mouth/Throat:     Pharynx: Oropharynx is clear.  Eyes:     Extraocular Movements: Extraocular movements intact.     Pupils: Pupils are equal, round, and reactive to light.  Cardiovascular:     Rate and Rhythm: Normal rate and regular rhythm.  Pulmonary:     Comments: Decreased breath sounds bilaterally.  Abdominal:     Palpations: Abdomen is soft.  Musculoskeletal:        General: Normal range of motion.     Cervical back: Normal range of motion.  Skin:    General: Skin is warm.  Neurological:     General: No focal deficit present.     Mental Status: He is alert and oriented to person, place, and time.  Psychiatric:        Behavior: Behavior normal.        Judgment: Judgment normal.     LABORATORY DATA:  I have reviewed the data as listed Lab Results  Component Value Date   WBC 8.6 04/30/2024   HGB 14.8 04/30/2024   HCT 43.9 04/30/2024   MCV 88.0 04/30/2024   PLT 299 04/30/2024   Recent Labs    10/05/23 0752 02/24/24 0810 04/30/24 0834  NA 138 139 140  K 3.7 3.7 4.1  CL 104 104 104  CO2 29 24 26   GLUCOSE 107* 133* 93  BUN 8 8 6   CREATININE 0.91 0.81 0.83  CALCIUM  9.3 9.2 9.9  GFRNONAA >60 >60 >60  PROT 6.4* 7.3 6.7  ALBUMIN  3.7 4.0 4.4  AST 15 21 23   ALT 14 20 21   ALKPHOS 63 70 78  BILITOT 0.6 0.9 0.5    RADIOGRAPHIC STUDIES: I have personally reviewed the radiological images as listed and agreed with the findings in the report. IR IMAGING  GUIDED PORT INSERTION Result Date: 04/08/2024 EXAM: Implanted venous access port placement COMPARISON: None. CLINICAL HISTORY: Poor IV access. Left upper lobe hypermetabolic pulmonary nodule with left hilar and mediastinal adenopathy. Complications: No immediate complications. Conscious Sedation: 2 mcg Versed  IV, 100 mcg fentanyl  IV. Sedation Technique: Under physician supervision, Versed  and fentanyl  were administered IV for moderate sedation. Pulse oximetry, heart rate, and blood pressure were continuously monitored with an independent trained observer present. Physician face-to-face sedation time: 16 minutes. Discussion: Time out performed including verification of patient, date of birth, procedure, and access site as appropriate. Informed written consent was obtained. The access site and chest wall were prepared and draped using maximal sterile barrier technique including cutaneous antisepsis. The patient was positioned supine. Initial imaging was performed. Venous access: Access site: Right internal jugular Laterality: Right Local anesthesia was administered. Under ultrasound guidance, venous access was obtained. A guidewire was advanced and a subcutaneous pocket was created. An implanted venous access port was placed with the catheter tunneled to the venous entry site. Port details: Port type: Single lumen Catheter tip position confirmed with imaging, in the region of the cavoatrial junction. The port was accessed and aspirated easily and flushed with saline and heparinized saline. The pocket and access site  were closed and a sterile dressing was applied. Post-procedure imaging: Immediate post-placement imaging: Fluoroscopy Post-procedure imaging findings: Stable port. No pneumothorax. Contrast: None. Estimated blood loss: Less than 10 mL. Radiation exposure index (as provided by the fluoroscopic device): 1 mGy air kerma. IMPRESSION: 1. Technically successful placement of a right IJ implanted venous access  port. 2. No immediate complications. Electronically signed by: Katheleen Faes MD 04/08/2024 02:27 PM EST RP Workstation: [id number]     Primary cancer of left upper lobe of lung (HCC) # NOV-DEC 2025-incidental.  CTA head and neck - Advanced emphysema with a slowly enlarging left upper lobe lung nodule now up to 2.1 cm; 1. Lung, biopsy, LUL :  CARCINOMA CONSISTENT WITH ADENOCARCINOMA [Dr.Kasa]. NOV 2025-  Hypermetabolic left upper lobe nodule compatible with malignancy;  Hypermetabolic left hilar and mediastinal adenopathy compatible with malignancy. Subsolid right upper lobe nodule with low-grade metabolic. Stage III- T1N2 Vs Stage ? IV [contralateral]. MRI brain- NOV 2025- NEG for metastases. S/p  Dr. Kerrin- NOt surgical candaiate. . Carbo-taxol  weekly- RT- adjuvant immunotherapy.  # proceed with cycle #1 of planned 6-8 weeks with concurrent RT. Labs-CBC/chemistries were reviewed with the patient.  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  As per patient is not symptomatic.  He also states that he has never been told to have emphysema.     # Hx of stroke [Dr.Heck; [place]]-on Brilinta  plus aspirin .  Last stroke as per patient June 2025-chronic tingling and numbness of the extremities residual from stroke- stable.   # Nutrition/speech path re: cancer/concern for malnutrition   # Active smoker: Patient has been trying to quit smoking.  Weekly-MD/labs- chemo- # DISPOSITION:  # chem today- # follow up  as per IS- -Dr.B   Above plan of care was discussed with patient/family in detail.  My contact information was given to the patient/family.     Cindy JONELLE Joe, MD 04/30/2024 10:13 AM

## 2024-04-30 NOTE — Progress Notes (Signed)
 Patient here for first time taxol  and carboplatin  infusion.  1059: Taxol  started at 65 ml/hr 1115: Rate increased to 131 ml/hr. Pt reports feeling good and vitals stable 1123: 8 minutes into second rate, patient states he feels really weird with heart palpitations and pressure in his face. Taxol  paused 1125: BP 149/67, HR 100 (was previously in the 70s). NS started at 999 ml/hr. Dasie, NP and Rennie, MD notified 1130: Dasie NP at chairside to assess. BP 140/89 and HR 106 1135: 10 mg singulair  given per order from Hilda, MD 1148: Patient states he feels back to his normal. Per Dasie, NP taxol  restarted at the 65 ml/hr rate and normal saline bolus stopped 1205: Vitals stable and patient feels fine. Taxol  rate increased to 131 ml/hr per Rennie, MD 1223: Vitals stable and patient feels fine so taxol  continued to be titrated per orders 1320: Taxol  completed at max rate of 263 ml/hr. Patient tolerated remainder of infusion with no issues 1414: Patient completed both taxol  and carboplatin  infusions. Vitals stable. All questions answered. Pt stable at discharge

## 2024-04-30 NOTE — Progress Notes (Signed)
 Nutrition Assessment   Reason for Assessment:  Referral from Dr B   ASSESSMENT:  59 year old male with stage IIIb lung cancer.  Past medical history of smoker, stroke, pre-DM, HTN, HLD, etoh use, emphysema.  Starting concurrent chemotherapy and radiation.   Met with patient during infusion. Reports decrease appetite for couple of years.  Has only 8 teeth and difficulty chewing.  Typical day is breakfast with cereal and coffee or ramen noodles and egg and fruit.  Lunch is sandwich or frozen meal or macaroni and cheese.  Supper is meat, vegetable and starch.  Used to cook but now mainly eats easy to prepare foods.  Drinks protein shake BID, water, coffee, carbonated beverage at times.     Medications: compazine , zofran , MVI   Labs: reviewed   Anthropometrics:   Height: 72 inches Weight: 136 lb 14.4 oz  137 lb 12/24 145 lb 06/11/23 Reports 160-170 lb year and half ago BMI: 19  6% weight loss in the last 11 months   Estimated Energy Needs  Kcals: 1550-1950 Protein: 78-98 g Fluid: 1550-1950 ml   NUTRITION DIAGNOSIS: Inadequate oral intake related to cancer, chronic illness as evidenced by 6% weight loss in the last year, decreased intake    INTERVENTION:  Discussed high calorie, high protein diet. Handout provided Continue oral nutrition supplements Contact information provided   MONITORING, EVALUATION, GOAL: weight trends, intake   Next Visit: Wed, Feb 4 during infsuion  Daanish Copes B. Dasie SOLON, CSO, LDN Registered Dietitian [phone]

## 2024-05-01 ENCOUNTER — Ambulatory Visit
Admission: RE | Admit: 2024-05-01 | Discharge: 2024-05-01 | Disposition: A | Discharge status: Home / Self Care | Source: Ambulatory Visit | Attending: Radiation Oncology | Admitting: Radiation Oncology

## 2024-05-01 ENCOUNTER — Other Ambulatory Visit: Payer: Self-pay

## 2024-05-01 LAB — RAD ONC ARIA SESSION SUMMARY
Course Elapsed Days: 1
Plan Fractions Treated to Date: 2
Plan Prescribed Dose Per Fraction: 2 Gy
Plan Total Fractions Prescribed: 33
Plan Total Prescribed Dose: 66 Gy
Reference Point Dosage Given to Date: 4 Gy
Reference Point Session Dosage Given: 2 Gy
Session Number: 2

## 2024-05-04 ENCOUNTER — Other Ambulatory Visit: Payer: Self-pay

## 2024-05-04 ENCOUNTER — Ambulatory Visit
Admission: RE | Admit: 2024-05-04 | Discharge: 2024-05-04 | Disposition: A | Source: Ambulatory Visit | Attending: Radiation Oncology | Admitting: Radiation Oncology

## 2024-05-04 ENCOUNTER — Telehealth: Payer: Self-pay

## 2024-05-04 LAB — RAD ONC ARIA SESSION SUMMARY
Course Elapsed Days: 4
Plan Fractions Treated to Date: 3
Plan Prescribed Dose Per Fraction: 2 Gy
Plan Total Fractions Prescribed: 33
Plan Total Prescribed Dose: 66 Gy
Reference Point Dosage Given to Date: 6 Gy
Reference Point Session Dosage Given: 2 Gy
Session Number: 3

## 2024-05-04 NOTE — Telephone Encounter (Signed)
Telephone call to patient for follow up after receiving first infusion.   No answer but left message stating we were calling to check on them.  Encouraged patient to call for any questions or concerns.   

## 2024-05-05 ENCOUNTER — Ambulatory Visit
Admission: RE | Admit: 2024-05-05 | Discharge: 2024-05-05 | Disposition: A | Source: Ambulatory Visit | Attending: Radiation Oncology | Admitting: Radiation Oncology

## 2024-05-05 ENCOUNTER — Other Ambulatory Visit: Payer: Self-pay

## 2024-05-05 ENCOUNTER — Ambulatory Visit: Admitting: Internal Medicine

## 2024-05-05 ENCOUNTER — Encounter: Payer: Self-pay | Admitting: Internal Medicine

## 2024-05-05 VITALS — BP 120/80 | HR 93 | Temp 98.2°F | Ht 72.0 in | Wt 139.8 lb

## 2024-05-05 DIAGNOSIS — F17210 Nicotine dependence, cigarettes, uncomplicated: Secondary | ICD-10-CM | POA: Diagnosis not present

## 2024-05-05 DIAGNOSIS — C3490 Malignant neoplasm of unspecified part of unspecified bronchus or lung: Secondary | ICD-10-CM

## 2024-05-05 DIAGNOSIS — J449 Chronic obstructive pulmonary disease, unspecified: Secondary | ICD-10-CM | POA: Diagnosis not present

## 2024-05-05 DIAGNOSIS — C3412 Malignant neoplasm of upper lobe, left bronchus or lung: Secondary | ICD-10-CM

## 2024-05-05 DIAGNOSIS — R590 Localized enlarged lymph nodes: Secondary | ICD-10-CM

## 2024-05-05 LAB — RAD ONC ARIA SESSION SUMMARY
Course Elapsed Days: 5
Plan Fractions Treated to Date: 4
Plan Prescribed Dose Per Fraction: 2 Gy
Plan Total Fractions Prescribed: 33
Plan Total Prescribed Dose: 66 Gy
Reference Point Dosage Given to Date: 8 Gy
Reference Point Session Dosage Given: 2 Gy
Session Number: 4

## 2024-05-05 NOTE — Progress Notes (Signed)
 " [facility] Pulmonary Medicine Consultation      Date: 05/05/2024,   MRN# [id number] TERRIUS GENTILE [birth year]     CHIEF COMPLAINT:   Follow up Stage 3B lung cancer Follow up assessment of COPD  HISTORY OF PRESENT ILLNESS   FEB 2025 Patient with a history of strokes had symptoms of a stroke and CT of the chest showed left upper lobe nodule and right upper lobe ground glass opacification Patient subsequently underwent stent placement in the carotid and was started on Brilinta  and aspirin  therapy  NOV 2025 PET CT scan  Independently reviewed by me today and also reviewed with radiology report 1. Hypermetabolic left upper lobe nodule compatible with malignancy. 2. Hypermetabolic left hilar and mediastinal adenopathy compatible with malignancy. 3. Subsolid right upper lobe nodule with low-grade metabolic activity, low-grade adenocarcinoma not excluded.  DEC 2025 CARCINOMA CONSISTENT WITH ADENOCARCINOMA Dx [employer]  Undergoing Chemo RXT   Patient is active smoker smoking cessation strongly advised  Other findings include bilateral emphysematous changes consistent with COPD however patient does not have any significant respiratory compromise at this time and does not take any type of inhalers  PFT January 2026 FEV1 60% predicted-moderate severe COPD Findings relayed to patient in detail   PAST MEDICAL HISTORY   Past Medical History:  Diagnosis Date   Abdominal aortic atherosclerosis    Anxiety    situational   Cancer (HCC) 2020   Complication of anesthesia    slow to wake up, anesthesia lingered for 24 hrs after , used a different anesthesia   Dyspnea    ETOH abuse    History of cerebral artery stenosis    Hyperlipidemia    Hypertension    Intracranial atherosclerosis    Pre-diabetes    Stroke [facility])      SURGICAL HISTORY   Past Surgical History:  Procedure Laterality Date   COLONOSCOPY WITH PROPOFOL  N/A 01/20/2019   Procedure: COLONOSCOPY WITH  PROPOFOL ;  Surgeon: Toledo, Ladell POUR, MD;  Location: [facility] END[facility]COPY;  Service: Gastroenterology;  Laterality: N/A;   ENDOBRONCHIAL ULTRASOUND Left 03/23/2024   Procedure: ENDOBRONCHIAL ULTRASOUND (EBUS);  Surgeon: Clydean Posas, MD;  Location: [facility] ORS;  Service: Pulmonary;  Laterality: Left;   IR ANGIO EXTRACRAN SEL COM CAROTID INNOMINATE UNI BILAT MOD SED  07/23/2017   IR ANGIO INTRA EXTRACRAN SEL COM CAROTID INNOMINATE UNI R MOD SED  11/07/2017   IR ANGIO VERTEBRAL SEL SUBCLAVIAN INNOMINATE UNI R MOD SED  11/07/2017   IR ANGIO VERTEBRAL SEL VERTEBRAL BILAT MOD SED  07/23/2017   IR CT HEAD LTD  06/11/2023   IR CT HEAD LTD  06/11/2023   IR IMAGING GUIDED PORT INSERTION  04/08/2024   IR INTRA CRAN STENT  06/11/2023   IR PERCUTANEOUS ART THROMBECTOMY/INFUSION INTRACRANIAL INC DIAG ANGIO  06/11/2023   IR RADIOLOGIST EVAL & MGMT  06/28/2023   MICROLARYNGOSCOPY N/A 06/30/2019   Procedure: SUSPENSION MICROLARYNGOSCOPY WITH MICROFLAP;  Surgeon: Milissa Hamming, MD;  Location: [facility] ORS;  Service: ENT;  Laterality: N/A;   RADIOLOGY WITH ANESTHESIA N/A 11/07/2017   Procedure: STENTING;  Surgeon: Dolphus Carrion, MD;  Location: [facility] OR;  Service: Radiology;  Laterality: N/A;   RADIOLOGY WITH ANESTHESIA N/A 06/11/2023   Procedure: IR WITH ANESTHESIA;  Surgeon: Radiologist, Medication, MD;  Location: [facility] OR;  Service: Radiology;  Laterality: N/A;   TEE WITHOUT CARDIOVERSION N/A 11/15/2014   Procedure: TRANSESOPHAGEAL ECHOCARDIOGRAM (TEE);  Surgeon: Evalene JINNY Lunger, MD;  Location: [facility] ORS;  Service: Cardiovascular;  Laterality: N/A;  TONSILLECTOMY     VIDEO BRONCHOSCOPY WITH ENDOBRONCHIAL NAVIGATION Bilateral 03/23/2024   Procedure: VIDEO BRONCHOSCOPY WITH ENDOBRONCHIAL NAVIGATION;  Surgeon: Isaiah Scrivener, MD;  Location: [facility] ORS;  Service: Pulmonary;  Laterality: Bilateral;     FAMILY HISTORY   Family History  Problem Relation Age of Onset   Breast cancer Mother    Cancer Mother    Breast  cancer Sister    Cancer Sister      SOCIAL HISTORY   Social History   Tobacco Use   Smoking status: Some Days    Current packs/day: 0.00    Types: Cigarettes    Last attempt to quit: 10/10/2023    Years since quitting: 0.5   Smokeless tobacco: Never   Tobacco comments:    Smokes 1-2 cigarettes occasionally when the cravings increase. Has requested a refill on nicotine  patches to use instead.  Vaping Use   Vaping status: Never Used  Substance Use Topics   Alcohol use: Not Currently   Drug use: Never     MEDICATIONS    Home Medication:  Current Outpatient Rx   Order #: [id number] Class: No Print   Order #: [id number] Class: Historical Med   Order #: [id number] Class: Normal   Order #: [id number] Class: Normal   Order #: [id number] Class: Normal   Order #: [id number] Class: Normal   Order #: [id number] Class: Normal   Order #: [id number] Class: Normal   Order #: [id number] Class: Normal    Current Medication:  Current Outpatient Medications:    amLODipine  (NORVASC ) 5 MG tablet, Take 1 tablet (5 mg total) by mouth in the morning., Disp: , Rfl:    aspirin  EC 81 MG tablet, Take 81 mg by mouth daily. Swallow whole., Disp: , Rfl:    ezetimibe  (ZETIA ) 10 MG tablet, Take 1 tablet (10 mg total) by mouth daily., Disp: 30 tablet, Rfl: 0   lidocaine -prilocaine  (EMLA ) cream, Apply to affected area once, Disp: 30 g, Rfl: 3   Multiple Vitamin (MULTIVITAMIN WITH MINERALS) TABS tablet, Take 1 tablet by mouth daily., Disp: 30 tablet, Rfl: 12   nicotine  (NICODERM CQ  - DOSED IN MG/24 HOURS) 21 mg/24hr patch, Place 1 patch (21 mg total) onto the skin daily., Disp: 28 patch, Rfl: 0   ondansetron  (ZOFRAN ) 8 MG tablet, Take 1 tablet (8 mg total) by mouth every 8 (eight) hours as needed for nausea or vomiting. Start on the third day after chemotherapy., Disp: 30 tablet, Rfl: 1   prochlorperazine  (COMPAZINE ) 10 MG tablet, Take 1 tablet (10 mg total) by mouth every 6 (six) hours as needed for nausea or vomiting.,  Disp: 30 tablet, Rfl: 1   rosuvastatin  (CRESTOR ) 40 MG tablet, Take 1 tablet (40 mg total) by mouth daily., Disp: 30 tablet, Rfl: 0    ALLERGIES   Patient has no known allergies.   BP 120/80   Pulse 93   Temp 98.2 F (36.8 C)   Ht 6' (1.829 m)   Wt 139 lb 12.8 oz (63.4 kg)   SpO2 96%   BMI 18.96 kg/m      Physical Examination:  General Appearance: No distress  EYES EOM intact.   NECK Supple, No JVD Pulmonary: normal breath sounds, No wheezing.  CardiovascularNormal S1,S2.  No m/r/g.   Ext pulses intact, cap refill intact  ALL OTHER ROS ARE NEGATIVE    IMAGING    IR IMAGING GUIDED PORT INSERTION Result Date: 04/08/2024 EXAM: Implanted venous access port placement COMPARISON: None. CLINICAL HISTORY: Poor IV access. Left upper lobe hypermetabolic pulmonary nodule  with left hilar and mediastinal adenopathy. Complications: No immediate complications. Conscious Sedation: 2 mcg Versed  IV, 100 mcg fentanyl  IV. Sedation Technique: Under physician supervision, Versed  and fentanyl  were administered IV for moderate sedation. Pulse oximetry, heart rate, and blood pressure were continuously monitored with an independent trained observer present. Physician face-to-face sedation time: 16 minutes. Discussion: Time out performed including verification of patient, date of birth, procedure, and access site as appropriate. Informed written consent was obtained. The access site and chest wall were prepared and draped using maximal sterile barrier technique including cutaneous antisepsis. The patient was positioned supine. Initial imaging was performed. Venous access: Access site: Right internal jugular Laterality: Right Local anesthesia was administered. Under ultrasound guidance, venous access was obtained. A guidewire was advanced and a subcutaneous pocket was created. An implanted venous access port was placed with the catheter tunneled to the venous entry site. Port details: Port type: Single  lumen Catheter tip position confirmed with imaging, in the region of the cavoatrial junction. The port was accessed and aspirated easily and flushed with saline and heparinized saline. The pocket and access site were closed and a sterile dressing was applied. Post-procedure imaging: Immediate post-placement imaging: Fluoroscopy Post-procedure imaging findings: Stable port. No pneumothorax. Contrast: None. Estimated blood loss: Less than 10 mL. Radiation exposure index (as provided by the fluoroscopic device): 1 mGy air kerma. IMPRESSION: 1. Technically successful placement of a right IJ implanted venous access port. 2. No immediate complications. Electronically signed by: Katheleen Faes MD 04/08/2024 02:27 PM EST RP Workstation: [id number]      ASSESSMENT/PLAN   59 year old pleasant white male seen today for several issues including left lung nodule, right upper lobe ground glass opacification with mediastinal adenopathy DX of Adenocarcinoma Stage 3B primary lung in the setting of underlying emphysematous changes and COPD  Primary lung cancer adenocarcinoma stage IIIb Follow-up chemoradiation with oncology and radiation oncology  Assessment of COPD Pulmonary function test reviewed in detail Patient with FEV1 60% predicted Findings suggestive of severe emphysematous changes and severe COPD However patient does not have any significant respiratory compromise Patient is not on any maintenance therapy at this time  Smoking cessation strongly advised Smoking Assessment and Cessation Counseling Upon further questioning, Patient smokes 1 ppd I have advised patient to quit/stop smoking as soon as possible due to high risk for multiple medical problems Patient is willing to quit smoking  I have advised patient that we can assist and have options of Nicotine  replacement therapy. I also advised patient on behavioral therapy and can provide oral medication therapy in conjunction with the other  therapies Follow up next Office visit  for assessment of smoking cessation Smoking cessation counseling advised for 4 minutes    CURRENT MEDICATIONS REVIEWED AT LENGTH WITH PATIENT TODAY   Patient  satisfied with Plan of action and management. All questions answered   Follow up 6 months   I spent a total of 45 minutes dedicated to the care of this patient on the date of this encounter to include pre-visit review of records, face-to-face time with the patient discussing conditions above, post visit ordering of testing, clinical documentation with the electronic health record, making appropriate referrals as documented, and communicating necessary information to the patient's healthcare team.    The Patient requires high complexity decision making for assessment and support, frequent evaluation and titration of therapies, application of advanced monitoring technologies and extensive interpretation of multiple databases.  Patient satisfied with Plan of action and management. All questions answered  Nickolas Alm Cellar, M.D.  [facility]  Medical Director [facility]         "

## 2024-05-05 NOTE — Patient Instructions (Signed)
 Recommend smoking cessation Follow-up with oncology Follow-up with neurology  Avoid Allergens and Irritants Avoid secondhand smoke Avoid SICK contacts Recommend  Masking  when appropriate Recommend Keep up-to-date with vaccinations

## 2024-05-06 ENCOUNTER — Inpatient Hospital Stay

## 2024-05-06 ENCOUNTER — Other Ambulatory Visit: Payer: Self-pay

## 2024-05-06 ENCOUNTER — Encounter: Payer: Self-pay | Admitting: Internal Medicine

## 2024-05-06 ENCOUNTER — Inpatient Hospital Stay: Admitting: Internal Medicine

## 2024-05-06 ENCOUNTER — Ambulatory Visit
Admission: RE | Admit: 2024-05-06 | Discharge: 2024-05-06 | Disposition: A | Source: Ambulatory Visit | Attending: Radiation Oncology | Admitting: Radiation Oncology

## 2024-05-06 VITALS — BP 117/73 | HR 89 | Resp 18

## 2024-05-06 VITALS — BP 139/82 | HR 83 | Temp 95.8°F | Resp 16 | Ht 72.0 in | Wt 137.5 lb

## 2024-05-06 DIAGNOSIS — C3412 Malignant neoplasm of upper lobe, left bronchus or lung: Secondary | ICD-10-CM

## 2024-05-06 LAB — RAD ONC ARIA SESSION SUMMARY
Course Elapsed Days: 6
Plan Fractions Treated to Date: 5
Plan Prescribed Dose Per Fraction: 2 Gy
Plan Total Fractions Prescribed: 33
Plan Total Prescribed Dose: 66 Gy
Reference Point Dosage Given to Date: 10 Gy
Reference Point Session Dosage Given: 2 Gy
Session Number: 5

## 2024-05-06 LAB — CMP (CANCER CENTER ONLY)
ALT: 22 U/L (ref 0–44)
AST: 21 U/L (ref 15–41)
Albumin: 4.3 g/dL (ref 3.5–5.0)
Alkaline Phosphatase: 68 U/L (ref 38–126)
Anion gap: 9 (ref 5–15)
BUN: 14 mg/dL (ref 6–20)
CO2: 26 mmol/L (ref 22–32)
Calcium: 9.4 mg/dL (ref 8.9–10.3)
Chloride: 103 mmol/L (ref 98–111)
Creatinine: 0.68 mg/dL (ref 0.61–1.24)
GFR, Estimated: >60 mL/min
Glucose, Bld: 105 mg/dL — ABNORMAL HIGH (ref 70–99)
Potassium: 4 mmol/L (ref 3.5–5.1)
Sodium: 138 mmol/L (ref 135–145)
Total Bilirubin: 0.6 mg/dL (ref 0.0–1.2)
Total Protein: 6.4 g/dL — ABNORMAL LOW (ref 6.5–8.1)

## 2024-05-06 LAB — CBC WITH DIFFERENTIAL (CANCER CENTER ONLY)
Abs Immature Granulocytes: 0.03 K/uL (ref 0.00–0.07)
Basophils Absolute: 0 K/uL (ref 0.0–0.1)
Basophils Relative: 1 %
Eosinophils Absolute: 0.1 K/uL (ref 0.0–0.5)
Eosinophils Relative: 1 %
HCT: 42.1 % (ref 39.0–52.0)
Hemoglobin: 14.3 g/dL (ref 13.0–17.0)
Immature Granulocytes: 1 %
Lymphocytes Relative: 21 %
Lymphs Abs: 1.4 K/uL (ref 0.7–4.0)
MCH: 29.7 pg (ref 26.0–34.0)
MCHC: 34 g/dL (ref 30.0–36.0)
MCV: 87.5 fL (ref 80.0–100.0)
Monocytes Absolute: 0.3 K/uL (ref 0.1–1.0)
Monocytes Relative: 5 %
Neutro Abs: 4.6 K/uL (ref 1.7–7.7)
Neutrophils Relative %: 71 %
Platelet Count: 228 K/uL (ref 150–400)
RBC: 4.81 MIL/uL (ref 4.22–5.81)
RDW: 12.4 % (ref 11.5–15.5)
WBC Count: 6.4 K/uL (ref 4.0–10.5)
nRBC: 0 % (ref 0.0–0.2)

## 2024-05-06 MED ORDER — FAMOTIDINE IN NACL 20-0.9 MG/50ML-% IV SOLN
20.0000 mg | Freq: Once | INTRAVENOUS | Status: AC
  Administered 2024-05-06: 20 mg via INTRAVENOUS
  Filled 2024-05-06: qty 50

## 2024-05-06 MED ORDER — PALONOSETRON HCL INJECTION 0.25 MG/5ML
0.2500 mg | Freq: Once | INTRAVENOUS | Status: AC
  Administered 2024-05-06: 0.25 mg via INTRAVENOUS
  Filled 2024-05-06: qty 5

## 2024-05-06 MED ORDER — DIPHENHYDRAMINE HCL 50 MG/ML IJ SOLN
50.0000 mg | Freq: Once | INTRAMUSCULAR | Status: AC
  Administered 2024-05-06: 50 mg via INTRAVENOUS
  Filled 2024-05-06: qty 1

## 2024-05-06 MED ORDER — SODIUM CHLORIDE 0.9 % IV SOLN
INTRAVENOUS | Status: DC
  Filled 2024-05-06: qty 250

## 2024-05-06 MED ORDER — SODIUM CHLORIDE 0.9 % IV SOLN
232.0000 mg | Freq: Once | INTRAVENOUS | Status: AC
  Administered 2024-05-06: 230 mg via INTRAVENOUS
  Filled 2024-05-06: qty 23

## 2024-05-06 MED ORDER — SODIUM CHLORIDE 0.9 % IV SOLN
45.0000 mg/m2 | Freq: Once | INTRAVENOUS | Status: AC
  Administered 2024-05-06: 78 mg via INTRAVENOUS
  Filled 2024-05-06: qty 13

## 2024-05-06 MED ORDER — DEXAMETHASONE SOD PHOSPHATE PF 10 MG/ML IJ SOLN
10.0000 mg | Freq: Once | INTRAMUSCULAR | Status: AC
  Administered 2024-05-06: 10 mg via INTRAVENOUS
  Filled 2024-05-06: qty 1

## 2024-05-06 NOTE — Patient Instructions (Signed)
 [facility] - A DEPT OF [facility]  Discharge Instructions: Thank you for choosing [facility] to provide your oncology and hematology care.  If you have a lab appointment with the Cancer Center, please go directly to the Cancer Center and check in at the registration area.  Wear comfortable clothing and clothing appropriate for easy access to any Portacath or PICC line.   We strive to give you quality time with your provider. You may need to reschedule your appointment if you arrive late (15 or more minutes).  Arriving late affects you and other patients whose appointments are after yours.  Also, if you miss three or more appointments without notifying the office, you may be dismissed from the clinic at the provider's discretion.      For prescription refill requests, have your pharmacy contact our office and allow 72 hours for refills to be completed.    Today you received the following chemotherapy and/or immunotherapy agents taxol/carboplatin       To help prevent nausea and vomiting after your treatment, we encourage you to take your nausea medication as directed.  BELOW ARE SYMPTOMS THAT SHOULD BE REPORTED IMMEDIATELY: *FEVER GREATER THAN 100.4 F (38 C) OR HIGHER *CHILLS OR SWEATING *NAUSEA AND VOMITING THAT IS NOT CONTROLLED WITH YOUR NAUSEA MEDICATION *UNUSUAL SHORTNESS OF BREATH *UNUSUAL BRUISING OR BLEEDING *URINARY PROBLEMS (pain or burning when urinating, or frequent urination) *BOWEL PROBLEMS (unusual diarrhea, constipation, pain near the anus) TENDERNESS IN MOUTH AND THROAT WITH OR WITHOUT PRESENCE OF ULCERS (sore throat, sores in mouth, or a toothache) UNUSUAL RASH, SWELLING OR PAIN  UNUSUAL VAGINAL DISCHARGE OR ITCHING   Items with * indicate a potential emergency and should be followed up as soon as possible or go to the Emergency Department if any problems should occur.  Please show the CHEMOTHERAPY ALERT CARD or  IMMUNOTHERAPY ALERT CARD at check-in to the Emergency Department and triage nurse.  Should you have questions after your visit or need to cancel or reschedule your appointment, please contact [facility] - A DEPT OF [facility]  [phone] and follow the prompts.  Office hours are 8:00 a.m. to 4:30 p.m. Monday - Friday. Please note that voicemails left after 4:00 p.m. may not be returned until the following business day.  We are closed weekends and major holidays. You have access to a nurse at all times for urgent questions. Please call the main number to the clinic [phone] and follow the prompts.  For any non-urgent questions, you may also contact your provider using MyChart. We now offer e-Visits for anyone 101 and older to request care online for non-urgent symptoms. For details visit [email or web address].   Also download the MyChart app! Go to the app store, search MyChart, open the app, select , and log in with your MyChart username and password.  Paclitaxel Injection What is this medication? PACLITAXEL (PAK li TAX el) treats some types of cancer. It works by slowing down the growth of cancer cells. This medicine may be used for other purposes; ask your health care provider or pharmacist if you have questions. COMMON BRAND NAME(S): Onxol, Taxol What should I tell my care team before I take this medication? They need to know if you have any of these conditions: Heart disease Liver disease Low white blood cell levels An unusual or allergic reaction to paclitaxel, other medications, foods, dyes, or preservatives If you  or your partner are pregnant or trying to get pregnant Breast-feeding How should I use this medication? This medication is injected into a vein. It is given by your care team in a hospital or clinic setting. Talk to your care team about the use of this medication in children. While it may be given to children for selected  conditions, precautions do apply. Overdosage: If you think you have taken too much of this medicine contact a poison control center or emergency room at once. NOTE: This medicine is only for you. Do not share this medicine with others. What if I miss a dose? Keep appointments for follow-up doses. It is important not to miss your dose. Call your care team if you are unable to keep an appointment. What may interact with this medication? Do not take this medication with any of the following: Live virus vaccines Other medications may affect the way this medication works. Talk with your care team about all of the medications you take. They may suggest changes to your treatment plan to lower the risk of side effects and to make sure your medications work as intended. This list may not describe all possible interactions. Give your health care provider a list of all the medicines, herbs, non-prescription drugs, or dietary supplements you use. Also tell them if you smoke, drink alcohol, or use illegal drugs. Some items may interact with your medicine. What should I watch for while using this medication? Your condition will be monitored carefully while you are receiving this medication. You may need blood work while taking this medication. This medication may make you feel generally unwell. This is not uncommon as chemotherapy can affect healthy cells as well as cancer cells. Report any side effects. Continue your course of treatment even though you feel ill unless your care team tells you to stop. This medication can cause serious allergic reactions. To reduce the risk, your care team may give you other medications to take before receiving this one. Be sure to follow the directions from your care team. This medication may increase your risk of getting an infection. Call your care team for advice if you get a fever, chills, sore throat, or other symptoms of a cold or flu. Do not treat yourself. Try to avoid  being around people who are sick. This medication may increase your risk to bruise or bleed. Call your care team if you notice any unusual bleeding. Be careful brushing or flossing your teeth or using a toothpick because you may get an infection or bleed more easily. If you have any dental work done, tell your dentist you are receiving this medication. Talk to your care team if you may be pregnant. Serious birth defects can occur if you take this medication during pregnancy. Talk to your care team before breastfeeding. Changes to your treatment plan may be needed. What side effects may I notice from receiving this medication? Side effects that you should report to your care team as soon as possible: Allergic reactions--skin rash, itching, hives, swelling of the face, lips, tongue, or throat Heart rhythm changes--fast or irregular heartbeat, dizziness, feeling faint or lightheaded, chest pain, trouble breathing Increase in blood pressure Infection--fever, chills, cough, sore throat, wounds that don't heal, pain or trouble when passing urine, general feeling of discomfort or being unwell Low blood pressure--dizziness, feeling faint or lightheaded, blurry vision Low red blood cell level--unusual weakness or fatigue, dizziness, headache, trouble breathing Painful swelling, warmth, or redness of the skin, blisters or sores  at the infusion site Pain, tingling, or numbness in the hands or feet Slow heartbeat--dizziness, feeling faint or lightheaded, confusion, trouble breathing, unusual weakness or fatigue Unusual bruising or bleeding Side effects that usually do not require medical attention (report to your care team if they continue or are bothersome): Diarrhea Hair loss Joint pain Loss of appetite Muscle pain Nausea Vomiting This list may not describe all possible side effects. Call your doctor for medical advice about side effects. You may report side effects to FDA at 1-800-FDA-1088. Where  should I keep my medication? This medication is given in a hospital or clinic. It will not be stored at home. NOTE: This sheet is a summary. It may not cover all possible information. If you have questions about this medicine, talk to your doctor, pharmacist, or health care provider.  2024 Elsevier/Gold Standard (2021-08-22 00:00:00) Carboplatin Injection What is this medication? CARBOPLATIN (KAR boe pla tin) treats some types of cancer. It works by slowing down the growth of cancer cells. This medicine may be used for other purposes; ask your health care provider or pharmacist if you have questions. COMMON BRAND NAME(S): Paraplatin What should I tell my care team before I take this medication? They need to know if you have any of these conditions: Blood disorders Hearing problems Kidney disease Recent or ongoing radiation therapy An unusual or allergic reaction to carboplatin, cisplatin, other medications, foods, dyes, or preservatives Pregnant or trying to get pregnant Breast-feeding How should I use this medication? This medication is injected into a vein. It is given by your care team in a hospital or clinic setting. Talk to your care team about the use of this medication in children. Special care may be needed. Overdosage: If you think you have taken too much of this medicine contact a poison control center or emergency room at once. NOTE: This medicine is only for you. Do not share this medicine with others. What if I miss a dose? Keep appointments for follow-up doses. It is important not to miss your dose. Call your care team if you are unable to keep an appointment. What may interact with this medication? Medications for seizures Some antibiotics, such as amikacin, gentamicin , neomycin, streptomycin, tobramycin Vaccines This list may not describe all possible interactions. Give your health care provider a list of all the medicines, herbs, non-prescription drugs, or dietary  supplements you use. Also tell them if you smoke, drink alcohol, or use illegal drugs. Some items may interact with your medicine. What should I watch for while using this medication? Your condition will be monitored carefully while you are receiving this medication. You may need blood work while taking this medication. This medication may make you feel generally unwell. This is not uncommon, as chemotherapy can affect healthy cells as well as cancer cells. Report any side effects. Continue your course of treatment even though you feel ill unless your care team tells you to stop. In some cases, you may be given additional medications to help with side effects. Follow all directions for their use. This medication may increase your risk of getting an infection. Call your care team for advice if you get a fever, chills, sore throat, or other symptoms of a cold or flu. Do not treat yourself. Try to avoid being around people who are sick. Avoid taking medications that contain aspirin, acetaminophen , ibuprofen, naproxen , or ketoprofen unless instructed by your care team. These medications may hide a fever. Be careful brushing or flossing your teeth or using  a toothpick because you may get an infection or bleed more easily. If you have any dental work done, tell your dentist you are receiving this medication. Talk to your care team if you wish to become pregnant or think you might be pregnant. This medication can cause serious birth defects. Talk to your care team about effective forms of contraception. Do not breast-feed while taking this medication. What side effects may I notice from receiving this medication? Side effects that you should report to your care team as soon as possible: Allergic reactions--skin rash, itching, hives, swelling of the face, lips, tongue, or throat Infection--fever, chills, cough, sore throat, wounds that don't heal, pain or trouble when passing urine, general feeling of  discomfort or being unwell Low red blood cell level--unusual weakness or fatigue, dizziness, headache, trouble breathing Pain, tingling, or numbness in the hands or feet, muscle weakness, change in vision, confusion or trouble speaking, loss of balance or coordination, trouble walking, seizures Unusual bruising or bleeding Side effects that usually do not require medical attention (report to your care team if they continue or are bothersome): Hair loss Nausea Unusual weakness or fatigue Vomiting This list may not describe all possible side effects. Call your doctor for medical advice about side effects. You may report side effects to FDA at 1-800-FDA-1088. Where should I keep my medication? This medication is given in a hospital or clinic. It will not be stored at home. NOTE: This sheet is a summary. It may not cover all possible information. If you have questions about this medicine, talk to your doctor, pharmacist, or health care provider.  2024 Elsevier/Gold Standard (2021-07-25 00:00:00)

## 2024-05-06 NOTE — Assessment & Plan Note (Addendum)
#   NOV-DEC 2025-incidental.  CTA head and neck - Advanced emphysema with a slowly enlarging left upper lobe lung nodule now up to 2.1 cm; 1. Lung, biopsy, LUL :  CARCINOMA CONSISTENT WITH ADENOCARCINOMA [Dr.Kasa]. NOV 2025-  Hypermetabolic left upper lobe nodule compatible with malignancy;  Hypermetabolic left hilar and mediastinal adenopathy compatible with malignancy. Subsolid right upper lobe nodule with low-grade metabolic. Stage III- T1N2 Vs Stage ? IV [contralateral]. MRI brain- NOV 2025- NEG for metastases. S/p  Dr. Kerrin- NOt surgical candaiate. . Carbo-taxol  weekly- RT- adjuvant immunotherapy.  # proceed with cycle # 2 of planned 6-8 weeks with concurrent RT [until 06/15/2024]. Labs-CBC/chemistries were reviewed with the patient.  # constipation- on mirlax prn. Recommend hydration.   # Advanced COPD based on imaging [ not symptomatic]-   # Hx of stroke [Dr.Heck; [place]]-on Brilinta  plus aspirin .  Last stroke as per patient June 2025-chronic tingling and numbness of the extremities residual from stroke- stable.   # Nutrition s/p evaluation malnutrition   # Active smoker: Patient has been trying to quit smoking.  PS; MD;Weekly-MD/labs x 3- chemo- # DISPOSITION:  # chem today-wait for Cmp # follow up  as per IS- -Dr.B

## 2024-05-06 NOTE — Progress Notes (Signed)
 2 days after last treatment he was very tired and constipated, taking miralax.

## 2024-05-06 NOTE — Progress Notes (Signed)
 [facility] CONSULT NOTE  Patient Care Team: [name], MD as PCP - General (Family Medicine) Verdene Gills, RN as Oncology Nurse Navigator Rennie Cindy SAUNDERS, MD as Consulting Physician (Oncology)  CHIEF COMPLAINTS/PURPOSE OF CONSULTATION: Lung cancer  Oncology History Overview Note  # NOV-DEC 2025-incidental.  CTA head and neck - Advanced emphysema with a slowly enlarging left upper lobe lung nodule now up to 2.1 cm; 1. Lung, biopsy, LUL :  CARCINOMA CONSISTENT WITH ADENOCARCINOMA [Dr.Kasa]. NOV 2025-  Hypermetabolic left upper lobe nodule compatible with malignancy;  Hypermetabolic left hilar and mediastinal adenopathy compatible with malignancy. Subsolid right upper lobe nodule with low-grade metabolic. Stage III- T1N2 Vs Stage ? IV [contralateral]. MRI brain- NOV 2025- NEG for metastases. S/p  Dr. Kerrin- NOt surgical candaiate. . Carbo-taxol  weekly- RT- adjuvant immunotherapy.  # JAN 2026- 14th- carbo-taxol  #1- RT until 06/15/2024   Primary cancer of left upper lobe of lung (HCC)  03/30/2024 Initial Diagnosis   Primary cancer of left upper lobe of lung (HCC)   03/30/2024 Cancer Staging   Staging form: Lung, AJCC V9 - Clinical: Stage IIIB (cT2a, cN2b, cM0) - Signed by Rennie Cindy SAUNDERS, MD on 03/30/2024   04/30/2024 -  Chemotherapy   Patient is on Treatment Plan : LUNG Carboplatin  + Paclitaxel  + XRT q7d        HISTORY OF PRESENTING ILLNESS:  Henry Boone 59 y.o.  male with a history of active smoking, history of stroke with stage III-non-small cell lung cancer is here for follow-up  Discussed the use of AI scribe software for clinical note transcription with the patient, who gave verbal consent to proceed.  History of Present Illness         Henry Boone is a 59 year old male with malignant neoplasm of the left upper lobe of the lung who presents for routine oncology follow-up and management of treatment-related symptoms.  He is currently  undergoing concurrent chemotherapy and radiation therapy for primary lung cancer. His most recent chemotherapy session was slowed, but he tolerated the infusion  without significant complication. He subsequently experienced significant fatigue and lethargy for two days, requiring increased rest and limiting his usual activities. His energy levels returned to baseline by Monday and Tuesday. He expresses concern regarding the potential for worsening fatigue with future cycles.  He developed mild constipation following chemotherapy, which resolved with Miralax and increased oral hydration. His bowel habits have since normalized, and he denies ongoing gastrointestinal symptoms. He notes that constipation is atypical for him and expected diarrhea rather than constipation. He denies nausea, vomiting, or the need for antiemetic therapy. He reports early satiety after meals, which he considers baseline and not associated with nausea or emesis.  He has not experienced new or concerning symptoms related to radiation therapy.      Review of Systems  Constitutional:  Positive for malaise/fatigue. Negative for chills, diaphoresis, fever and weight loss.  HENT:  Negative for nosebleeds and sore throat.   Eyes:  Negative for double vision.  Respiratory:  Positive for cough and shortness of breath. Negative for hemoptysis, sputum production and wheezing.   Cardiovascular:  Negative for chest pain, palpitations, orthopnea and leg swelling.  Gastrointestinal:  Negative for abdominal pain, blood in stool, constipation, diarrhea, heartburn, melena, nausea and vomiting.  Genitourinary:  Negative for dysuria, frequency and urgency.  Musculoskeletal:  Positive for back pain and joint pain.  Skin: Negative.  Negative for itching and rash.  Neurological:  Negative for dizziness, tingling, focal  weakness, weakness and headaches.  Endo/Heme/Allergies:  Does not bruise/bleed easily.  Psychiatric/Behavioral:  Negative for  depression. The patient is not nervous/anxious and does not have insomnia.     MEDICAL HISTORY:  Past Medical History:  Diagnosis Date   Abdominal aortic atherosclerosis    Anxiety    situational   Cancer (HCC) 2020   Complication of anesthesia    slow to wake up, anesthesia lingered for 24 hrs after , used a different anesthesia   Dyspnea    ETOH abuse    History of cerebral artery stenosis    Hyperlipidemia    Hypertension    Intracranial atherosclerosis    Pre-diabetes    Stroke [facility])     SURGICAL HISTORY: Past Surgical History:  Procedure Laterality Date   COLONOSCOPY WITH PROPOFOL  N/A 01/20/2019   Procedure: COLONOSCOPY WITH PROPOFOL ;  Surgeon: Toledo, Ladell POUR, MD;  Location: [facility] ENDOSCOPY;  Service: Gastroenterology;  Laterality: N/A;   ENDOBRONCHIAL ULTRASOUND Left 03/23/2024   Procedure: ENDOBRONCHIAL ULTRASOUND (EBUS);  Surgeon: Kasa, Kurian, MD;  Location: [facility] ORS;  Service: Pulmonary;  Laterality: Left;   IR ANGIO EXTRACRAN SEL COM CAROTID INNOMINATE UNI BILAT MOD SED  07/23/2017   IR ANGIO INTRA EXTRACRAN SEL COM CAROTID INNOMINATE UNI Boone MOD SED  11/07/2017   IR ANGIO VERTEBRAL SEL SUBCLAVIAN INNOMINATE UNI Boone MOD SED  11/07/2017   IR ANGIO VERTEBRAL SEL VERTEBRAL BILAT MOD SED  07/23/2017   IR CT HEAD LTD  06/11/2023   IR CT HEAD LTD  06/11/2023   IR IMAGING GUIDED PORT INSERTION  04/08/2024   IR INTRA CRAN STENT  06/11/2023   IR PERCUTANEOUS ART THROMBECTOMY/INFUSION INTRACRANIAL INC DIAG ANGIO  06/11/2023   IR RADIOLOGIST EVAL & MGMT  06/28/2023   MICROLARYNGOSCOPY N/A 06/30/2019   Procedure: SUSPENSION MICROLARYNGOSCOPY WITH MICROFLAP;  Surgeon: Milissa Hamming, MD;  Location: [facility] ORS;  Service: ENT;  Laterality: N/A;   RADIOLOGY WITH ANESTHESIA N/A 11/07/2017   Procedure: STENTING;  Surgeon: Dolphus Carrion, MD;  Location: [facility] OR;  Service: Radiology;  Laterality: N/A;   RADIOLOGY WITH ANESTHESIA N/A 06/11/2023   Procedure: IR WITH ANESTHESIA;   Surgeon: Radiologist, Medication, MD;  Location: [facility] OR;  Service: Radiology;  Laterality: N/A;   TEE WITHOUT CARDIOVERSION N/A 11/15/2014   Procedure: TRANSESOPHAGEAL ECHOCARDIOGRAM (TEE);  Surgeon: Evalene JINNY Lunger, MD;  Location: [facility] ORS;  Service: Cardiovascular;  Laterality: N/A;   TONSILLECTOMY     VIDEO BRONCHOSCOPY WITH ENDOBRONCHIAL NAVIGATION Bilateral 03/23/2024   Procedure: VIDEO BRONCHOSCOPY WITH ENDOBRONCHIAL NAVIGATION;  Surgeon: Isaiah Scrivener, MD;  Location: [facility] ORS;  Service: Pulmonary;  Laterality: Bilateral;    SOCIAL HISTORY: Social History   Socioeconomic History   Marital status: Significant Other    Spouse name: [name],[name] (Significant other)   Number of children: Not on file   Years of education: Not on file   Highest education level: Not on file  Occupational History   Not on file  Tobacco Use   Smoking status: Some Days    Current packs/day: 0.00    Types: Cigarettes    Last attempt to quit: 10/10/2023    Years since quitting: 0.5   Smokeless tobacco: Never   Tobacco comments:    Smokes 1-2 cigarettes occasionally when the cravings increase. Has requested a refill on nicotine  patches to use instead.  Vaping Use   Vaping status: Never Used  Substance and Sexual Activity   Alcohol use: Not Currently   Drug use: Never   Sexual activity: Yes  Other Topics Concern   Not on file  Social History Narrative   Not on file   Social Drivers of Health   Tobacco Use: High Risk (05/06/2024)   Patient History    Smoking Tobacco Use: Some Days    Smokeless Tobacco Use: Never    Passive Exposure: Not on file  Financial Resource Strain: Low Risk  (04/11/2023)   Received from [facility]   Overall Financial Resource Strain (CARDIA)    Difficulty of Paying Living Expenses: Not hard at all  Food Insecurity: No Food Insecurity (02/26/2024)   Epic    Worried About Running Out of Food in the Last Year: Never true    Ran Out of Food in the  Last Year: Never true  Transportation Needs: No Transportation Needs (02/26/2024)   Epic    Lack of Transportation (Medical): No    Lack of Transportation (Non-Medical): No  Physical Activity: Not on file  Stress: Not on file  Social Connections: Not on file  Intimate Partner Violence: Not At Risk (02/26/2024)   Epic    Fear of Current or Ex-Partner: No    Emotionally Abused: No    Physically Abused: No    Sexually Abused: No  Depression (PHQ2-9): Low Risk (05/06/2024)   Depression (PHQ2-9)    PHQ-2 Score: 0  Alcohol Screen: Not on file  Housing: Low Risk (02/26/2024)   Epic    Unable to Pay for Housing in the Last Year: No    Number of Times Moved in the Last Year: 0    Homeless in the Last Year: No  Utilities: Not At Risk (02/26/2024)   Epic    Threatened with loss of utilities: No  Health Literacy: Not on file    FAMILY HISTORY: Family History  Problem Relation Age of Onset   Breast cancer Mother    Cancer Mother    Breast cancer Sister    Cancer Sister     ALLERGIES:  has no known allergies.  MEDICATIONS:  Current Outpatient Medications  Medication Sig Dispense Refill   amLODipine  (NORVASC ) 5 MG tablet Take 1 tablet (5 mg total) by mouth in the morning.     aspirin  EC 81 MG tablet Take 81 mg by mouth daily. Swallow whole.     ezetimibe  (ZETIA ) 10 MG tablet Take 1 tablet (10 mg total) by mouth daily. 30 tablet 0   lidocaine -prilocaine  (EMLA ) cream Apply to affected area once 30 g 3   Multiple Vitamin (MULTIVITAMIN WITH MINERALS) TABS tablet Take 1 tablet by mouth daily. 30 tablet 12   ondansetron  (ZOFRAN ) 8 MG tablet Take 1 tablet (8 mg total) by mouth every 8 (eight) hours as needed for nausea or vomiting. Start on the third day after chemotherapy. 30 tablet 1   prochlorperazine  (COMPAZINE ) 10 MG tablet Take 1 tablet (10 mg total) by mouth every 6 (six) hours as needed for nausea or vomiting. 30 tablet 1   rosuvastatin  (CRESTOR ) 40 MG tablet Take 1 tablet (40 mg  total) by mouth daily. 30 tablet 0   nicotine  (NICODERM CQ  - DOSED IN MG/24 HOURS) 21 mg/24hr patch Place 1 patch (21 mg total) onto the skin daily. (Patient not taking: Reported on 05/06/2024) 28 patch 0   No current facility-administered medications for this visit.   Facility-Administered Medications Ordered in Other Visits  Medication Dose Route Frequency Provider Last Rate Last Admin   0.9 %  sodium chloride  infusion   Intravenous Continuous Henry Ishee R, MD  CARBOplatin  (PARAPLATIN ) 230 mg in sodium chloride  0.9 % 100 mL chemo infusion  230 mg Intravenous Once Daylyn Azbill R, MD       dexamethasone  (DECADRON ) injection 10 mg  10 mg Intravenous Once Roshunda Keir R, MD       diphenhydrAMINE  (BENADRYL ) injection 50 mg  50 mg Intravenous Once Miguelina Fore R, MD       famotidine  (PEPCID ) IVPB 20 mg premix  20 mg Intravenous Once Yousif Edelson R, MD       PACLitaxel  (TAXOL ) 78 mg in sodium chloride  0.9 % 250 mL chemo infusion (</= 80mg /m2)  45 mg/m2 (Treatment Plan Recorded) Intravenous Once Tal Kempker R, MD       palonosetron  (ALOXI ) injection 0.25 mg  0.25 mg Intravenous Once Jaidon Ellery R, MD        PHYSICAL EXAMINATION:  Vitals:   05/06/24 0828  BP: 139/82  Pulse: 83  Resp: 16  Temp: (!) 95.8 F (35.4 C)  SpO2: 98%    Filed Weights   05/06/24 0828  Weight: 137 lb 8 oz (62.4 kg)     Physical Exam Vitals and nursing note reviewed.  HENT:     Head: Normocephalic and atraumatic.     Mouth/Throat:     Pharynx: Oropharynx is clear.  Eyes:     Extraocular Movements: Extraocular movements intact.     Pupils: Pupils are equal, round, and reactive to light.  Cardiovascular:     Rate and Rhythm: Normal rate and regular rhythm.  Pulmonary:     Comments: Decreased breath sounds bilaterally.  Abdominal:     Palpations: Abdomen is soft.  Musculoskeletal:        General: Normal range of motion.     Cervical back: Normal  range of motion.  Skin:    General: Skin is warm.  Neurological:     General: No focal deficit present.     Mental Status: He is alert and oriented to person, place, and time.  Psychiatric:        Behavior: Behavior normal.        Judgment: Judgment normal.     LABORATORY DATA:  I have reviewed the data as listed Lab Results  Component Value Date   WBC 6.4 05/06/2024   HGB 14.3 05/06/2024   HCT 42.1 05/06/2024   MCV 87.5 05/06/2024   PLT 228 05/06/2024   Recent Labs    02/24/24 0810 04/30/24 0834 05/06/24 0838  NA 139 140 138  K 3.7 4.1 4.0  CL 104 104 103  CO2 24 26 26   GLUCOSE 133* 93 105*  BUN 8 6 14   CREATININE 0.81 0.83 0.68  CALCIUM  9.2 9.9 9.4  GFRNONAA >60 >60 >60  PROT 7.3 6.7 6.4*  ALBUMIN  4.0 4.4 4.3  AST 21 23 21   ALT 20 21 22   ALKPHOS 70 78 68  BILITOT 0.9 0.5 0.6    RADIOGRAPHIC STUDIES: I have personally reviewed the radiological images as listed and agreed with the findings in the report. IR IMAGING GUIDED PORT INSERTION Result Date: 04/08/2024 EXAM: Implanted venous access port placement COMPARISON: None. CLINICAL HISTORY: Poor IV access. Left upper lobe hypermetabolic pulmonary nodule with left hilar and mediastinal adenopathy. Complications: No immediate complications. Conscious Sedation: 2 mcg Versed  IV, 100 mcg fentanyl  IV. Sedation Technique: Under physician supervision, Versed  and fentanyl  were administered IV for moderate sedation. Pulse oximetry, heart rate, and blood pressure were continuously monitored with an independent trained observer present. Physician face-to-face sedation time: 74  minutes. Discussion: Time out performed including verification of patient, date of birth, procedure, and access site as appropriate. Informed written consent was obtained. The access site and chest wall were prepared and draped using maximal sterile barrier technique including cutaneous antisepsis. The patient was positioned supine. Initial imaging was  performed. Venous access: Access site: Right internal jugular Laterality: Right Local anesthesia was administered. Under ultrasound guidance, venous access was obtained. A guidewire was advanced and a subcutaneous pocket was created. An implanted venous access port was placed with the catheter tunneled to the venous entry site. Port details: Port type: Single lumen Catheter tip position confirmed with imaging, in the region of the cavoatrial junction. The port was accessed and aspirated easily and flushed with saline and heparinized saline. The pocket and access site were closed and a sterile dressing was applied. Post-procedure imaging: Immediate post-placement imaging: Fluoroscopy Post-procedure imaging findings: Stable port. No pneumothorax. Contrast: None. Estimated blood loss: Less than 10 mL. Radiation exposure index (as provided by the fluoroscopic device): 1 mGy air kerma. IMPRESSION: 1. Technically successful placement of a right IJ implanted venous access port. 2. No immediate complications. Electronically signed by: Katheleen Faes MD 04/08/2024 02:27 PM EST RP Workstation: [id number]     Primary cancer of left upper lobe of lung (HCC) # NOV-DEC 2025-incidental.  CTA head and neck - Advanced emphysema with a slowly enlarging left upper lobe lung nodule now up to 2.1 cm; 1. Lung, biopsy, LUL :  CARCINOMA CONSISTENT WITH ADENOCARCINOMA [Dr.Kasa]. NOV 2025-  Hypermetabolic left upper lobe nodule compatible with malignancy;  Hypermetabolic left hilar and mediastinal adenopathy compatible with malignancy. Subsolid right upper lobe nodule with low-grade metabolic. Stage III- T1N2 Vs Stage ? IV [contralateral]. MRI brain- NOV 2025- NEG for metastases. S/p  Dr. Kerrin- NOt surgical candaiate. . Carbo-taxol  weekly- RT- adjuvant immunotherapy.  # proceed with cycle # 2 of planned 6-8 weeks with concurrent RT [until 06/15/2024]. Labs-CBC/chemistries were reviewed with the patient.  # constipation- on  mirlax prn. Recommend hydration.   # Advanced COPD based on imaging [ not symptomatic]-   # Hx of stroke [Dr.Heck; [place]]-on Brilinta  plus aspirin .  Last stroke as per patient June 2025-chronic tingling and numbness of the extremities residual from stroke- stable.   # Nutrition s/p evaluation malnutrition   # Active smoker: Patient has been trying to quit smoking.  PS; MD;Weekly-MD/labs x 3- chemo- # DISPOSITION:  # chem today-wait for Cmp # follow up  as per IS- -Dr.B   Above plan of care was discussed with patient/family in detail.  My contact information was given to the patient/family.     Cindy JONELLE Joe, MD 05/06/2024 9:41 AM

## 2024-05-07 ENCOUNTER — Ambulatory Visit
Admission: RE | Admit: 2024-05-07 | Discharge: 2024-05-07 | Disposition: A | Discharge status: Home / Self Care | Source: Ambulatory Visit | Attending: Radiation Oncology | Admitting: Radiation Oncology

## 2024-05-07 ENCOUNTER — Other Ambulatory Visit: Payer: Self-pay

## 2024-05-07 LAB — RAD ONC ARIA SESSION SUMMARY
Course Elapsed Days: 7
Plan Fractions Treated to Date: 6
Plan Prescribed Dose Per Fraction: 2 Gy
Plan Total Fractions Prescribed: 33
Plan Total Prescribed Dose: 66 Gy
Reference Point Dosage Given to Date: 12 Gy
Reference Point Session Dosage Given: 2 Gy
Session Number: 6

## 2024-05-07 NOTE — Addendum Note (Signed)
 Addended byBETHA ISAIAH LENIS on: 05/07/2024 12:36 PM   Modules accepted: Level of Service

## 2024-05-08 ENCOUNTER — Other Ambulatory Visit: Payer: Self-pay

## 2024-05-08 ENCOUNTER — Ambulatory Visit
Admission: RE | Admit: 2024-05-08 | Discharge: 2024-05-08 | Disposition: A | Source: Ambulatory Visit | Attending: Radiation Oncology | Admitting: Radiation Oncology

## 2024-05-08 LAB — RAD ONC ARIA SESSION SUMMARY
Course Elapsed Days: 8
Plan Fractions Treated to Date: 7
Plan Prescribed Dose Per Fraction: 2 Gy
Plan Total Fractions Prescribed: 33
Plan Total Prescribed Dose: 66 Gy
Reference Point Dosage Given to Date: 14 Gy
Reference Point Session Dosage Given: 2 Gy
Session Number: 7

## 2024-05-11 ENCOUNTER — Ambulatory Visit

## 2024-05-12 ENCOUNTER — Ambulatory Visit

## 2024-05-13 ENCOUNTER — Other Ambulatory Visit: Payer: Self-pay

## 2024-05-13 ENCOUNTER — Encounter: Payer: Self-pay | Admitting: Internal Medicine

## 2024-05-13 ENCOUNTER — Inpatient Hospital Stay: Admitting: Internal Medicine

## 2024-05-13 ENCOUNTER — Inpatient Hospital Stay

## 2024-05-13 ENCOUNTER — Ambulatory Visit
Admission: RE | Admit: 2024-05-13 | Discharge: 2024-05-13 | Disposition: A | Source: Ambulatory Visit | Attending: Radiation Oncology | Admitting: Radiation Oncology

## 2024-05-13 VITALS — BP 118/71 | HR 77

## 2024-05-13 VITALS — BP 122/87 | HR 91 | Temp 95.9°F | Resp 16 | Ht 72.0 in | Wt 142.1 lb

## 2024-05-13 DIAGNOSIS — C3412 Malignant neoplasm of upper lobe, left bronchus or lung: Secondary | ICD-10-CM

## 2024-05-13 LAB — RAD ONC ARIA SESSION SUMMARY
Course Elapsed Days: 13
Plan Fractions Treated to Date: 8
Plan Prescribed Dose Per Fraction: 2 Gy
Plan Total Fractions Prescribed: 33
Plan Total Prescribed Dose: 66 Gy
Reference Point Dosage Given to Date: 16 Gy
Reference Point Session Dosage Given: 2 Gy
Session Number: 8

## 2024-05-13 LAB — CMP (CANCER CENTER ONLY)
ALT: 29 U/L (ref 0–44)
AST: 23 U/L (ref 15–41)
Albumin: 4.1 g/dL (ref 3.5–5.0)
Alkaline Phosphatase: 73 U/L (ref 38–126)
Anion gap: 11 (ref 5–15)
BUN: 9 mg/dL (ref 6–20)
CO2: 25 mmol/L (ref 22–32)
Calcium: 9.3 mg/dL (ref 8.9–10.3)
Chloride: 106 mmol/L (ref 98–111)
Creatinine: 0.73 mg/dL (ref 0.61–1.24)
GFR, Estimated: >60 mL/min
Glucose, Bld: 96 mg/dL (ref 70–99)
Potassium: 4 mmol/L (ref 3.5–5.1)
Sodium: 142 mmol/L (ref 135–145)
Total Bilirubin: 0.4 mg/dL (ref 0.0–1.2)
Total Protein: 6.1 g/dL — ABNORMAL LOW (ref 6.5–8.1)

## 2024-05-13 LAB — CBC WITH DIFFERENTIAL (CANCER CENTER ONLY)
Abs Immature Granulocytes: 0.02 10*3/uL (ref 0.00–0.07)
Basophils Absolute: 0 10*3/uL (ref 0.0–0.1)
Basophils Relative: 1 %
Eosinophils Absolute: 0.1 10*3/uL (ref 0.0–0.5)
Eosinophils Relative: 1 %
HCT: 38.2 % — ABNORMAL LOW (ref 39.0–52.0)
Hemoglobin: 12.9 g/dL — ABNORMAL LOW (ref 13.0–17.0)
Immature Granulocytes: 0 %
Lymphocytes Relative: 19 %
Lymphs Abs: 1.1 10*3/uL (ref 0.7–4.0)
MCH: 29.9 pg (ref 26.0–34.0)
MCHC: 33.8 g/dL (ref 30.0–36.0)
MCV: 88.6 fL (ref 80.0–100.0)
Monocytes Absolute: 0.5 10*3/uL (ref 0.1–1.0)
Monocytes Relative: 10 %
Neutro Abs: 3.8 10*3/uL (ref 1.7–7.7)
Neutrophils Relative %: 69 %
Platelet Count: 218 10*3/uL (ref 150–400)
RBC: 4.31 MIL/uL (ref 4.22–5.81)
RDW: 12.4 % (ref 11.5–15.5)
WBC Count: 5.5 10*3/uL (ref 4.0–10.5)
nRBC: 0 % (ref 0.0–0.2)

## 2024-05-13 MED ORDER — FAMOTIDINE IN NACL 20-0.9 MG/50ML-% IV SOLN
20.0000 mg | Freq: Once | INTRAVENOUS | Status: AC
  Administered 2024-05-13: 20 mg via INTRAVENOUS
  Filled 2024-05-13: qty 50

## 2024-05-13 MED ORDER — SODIUM CHLORIDE 0.9 % IV SOLN
INTRAVENOUS | Status: DC
  Filled 2024-05-13: qty 250

## 2024-05-13 MED ORDER — SODIUM CHLORIDE 0.9 % IV SOLN
232.0000 mg | Freq: Once | INTRAVENOUS | Status: AC
  Administered 2024-05-13: 230 mg via INTRAVENOUS
  Filled 2024-05-13: qty 23

## 2024-05-13 MED ORDER — PALONOSETRON HCL INJECTION 0.25 MG/5ML
0.2500 mg | Freq: Once | INTRAVENOUS | Status: AC
  Administered 2024-05-13: 0.25 mg via INTRAVENOUS
  Filled 2024-05-13: qty 5

## 2024-05-13 MED ORDER — DEXAMETHASONE SOD PHOSPHATE PF 10 MG/ML IJ SOLN
10.0000 mg | Freq: Once | INTRAMUSCULAR | Status: AC
  Administered 2024-05-13: 10 mg via INTRAVENOUS
  Filled 2024-05-13: qty 1

## 2024-05-13 MED ORDER — SODIUM CHLORIDE 0.9 % IV SOLN
45.0000 mg/m2 | Freq: Once | INTRAVENOUS | Status: AC
  Administered 2024-05-13: 78 mg via INTRAVENOUS
  Filled 2024-05-13: qty 13

## 2024-05-13 MED ORDER — DIPHENHYDRAMINE HCL 50 MG/ML IJ SOLN
25.0000 mg | Freq: Once | INTRAMUSCULAR | Status: AC
  Administered 2024-05-13: 25 mg via INTRAVENOUS
  Filled 2024-05-13: qty 1

## 2024-05-13 NOTE — Patient Instructions (Signed)
 [facility] - A DEPT OF [facility]  Discharge Instructions: Thank you for choosing [facility] to provide your oncology and hematology care.  If you have a lab appointment with the Cancer Center, please go directly to the Cancer Center and check in at the registration area.  Wear comfortable clothing and clothing appropriate for easy access to any Portacath or PICC line.   We strive to give you quality time with your provider. You may need to reschedule your appointment if you arrive late (15 or more minutes).  Arriving late affects you and other patients whose appointments are after yours.  Also, if you miss three or more appointments without notifying the office, you may be dismissed from the clinic at the provider's discretion.      For prescription refill requests, have your pharmacy contact our office and allow 72 hours for refills to be completed.    Today you received the following chemotherapy and/or immunotherapy agents- taxol, carboplatin      To help prevent nausea and vomiting after your treatment, we encourage you to take your nausea medication as directed.  BELOW ARE SYMPTOMS THAT SHOULD BE REPORTED IMMEDIATELY: *FEVER GREATER THAN 100.4 F (38 C) OR HIGHER *CHILLS OR SWEATING *NAUSEA AND VOMITING THAT IS NOT CONTROLLED WITH YOUR NAUSEA MEDICATION *UNUSUAL SHORTNESS OF BREATH *UNUSUAL BRUISING OR BLEEDING *URINARY PROBLEMS (pain or burning when urinating, or frequent urination) *BOWEL PROBLEMS (unusual diarrhea, constipation, pain near the anus) TENDERNESS IN MOUTH AND THROAT WITH OR WITHOUT PRESENCE OF ULCERS (sore throat, sores in mouth, or a toothache) UNUSUAL RASH, SWELLING OR PAIN  UNUSUAL VAGINAL DISCHARGE OR ITCHING   Items with * indicate a potential emergency and should be followed up as soon as possible or go to the Emergency Department if any problems should occur.  Please show the CHEMOTHERAPY ALERT CARD or  IMMUNOTHERAPY ALERT CARD at check-in to the Emergency Department and triage nurse.  Should you have questions after your visit or need to cancel or reschedule your appointment, please contact [facility] - A DEPT OF [facility]  [phone] and follow the prompts.  Office hours are 8:00 a.m. to 4:30 p.m. Monday - Friday. Please note that voicemails left after 4:00 p.m. may not be returned until the following business day.  We are closed weekends and major holidays. You have access to a nurse at all times for urgent questions. Please call the main number to the clinic [phone] and follow the prompts.  For any non-urgent questions, you may also contact your provider using MyChart. We now offer e-Visits for anyone 36 and older to request care online for non-urgent symptoms. For details visit [email or web address].   Also download the MyChart app! Go to the app store, search "MyChart", open the app, select [place], and log in with your MyChart username and password.

## 2024-05-13 NOTE — Progress Notes (Signed)
 Day 2 and 3 after chemo he is very tired and feels foggy.

## 2024-05-13 NOTE — Assessment & Plan Note (Addendum)
#   NOV-DEC 2025-incidental.  CTA head and neck - Advanced emphysema with a slowly enlarging left upper lobe lung nodule now up to 2.1 cm;  Lung, biopsy, LUL :  CARCINOMA CONSISTENT WITH ADENOCARCINOMA [Dr.Kasa]. NOV 2025-  Hypermetabolic left upper lobe nodule compatible with malignancy;  Hypermetabolic left hilar and mediastinal adenopathy compatible with malignancy. Subsolid right upper lobe nodule with low-grade metabolic. Stage III- T1N2 Vs Stage ? IV [contralateral]. MRI brain- NOV 2025- NEG for metastases. S/p  Dr. Kerrin- NOt surgical candaiate. . Carbo-taxol  weekly- RT- adjuvant immunotherapy.  # proceed with cycle # 3 of planned 6-8 weeks with concurrent RT [until 06/15/2024]. Labs-CBC/chemistries were reviewed with the patient.  # Brain fog: cut down benadryl  to 25 mg/day.   # constipation- on mirlax prn. Improved. .   # Advanced COPD based on imaging [ not symptomatic]-   # Hx of stroke [Dr.Heck; [place]]-on Brilinta  plus aspirin .  Last stroke as per patient June 2025-chronic tingling and numbness of the extremities residual from stroke- stable.   # Nutrition s/p evaluation malnutrition   # Active smoker: Patient has been trying to quit smoking.  PS; MD;Weekly-MD/labs x 3- chemo-  # DISPOSITION:  # chem today-wait for Cmp # follow up  as per IS- -Dr.B

## 2024-05-13 NOTE — Progress Notes (Signed)
 [facility] CONSULT NOTE  Patient Care Team: [name], MD as PCP - General (Family Medicine) Verdene Gills, RN as Oncology Nurse Navigator Rennie Cindy SAUNDERS, MD as Consulting Physician (Oncology)  CHIEF COMPLAINTS/PURPOSE OF CONSULTATION: Lung cancer  Oncology History Overview Note  # NOV-DEC 2025-incidental.  CTA head and neck - Advanced emphysema with a slowly enlarging left upper lobe lung nodule now up to 2.1 cm; 1. Lung, biopsy, LUL :  CARCINOMA CONSISTENT WITH ADENOCARCINOMA [Dr.Kasa]. NOV 2025-  Hypermetabolic left upper lobe nodule compatible with malignancy;  Hypermetabolic left hilar and mediastinal adenopathy compatible with malignancy. Subsolid right upper lobe nodule with low-grade metabolic. Stage III- T1N2 Vs Stage ? IV [contralateral]. MRI brain- NOV 2025- NEG for metastases. S/p  Dr. Kerrin- NOt surgical candaiate. . Carbo-taxol  weekly- RT- adjuvant immunotherapy.  # JAN 2026- 14th- carbo-taxol  #1- RT until 06/15/2024   Primary cancer of left upper lobe of lung (HCC)  03/30/2024 Initial Diagnosis   Primary cancer of left upper lobe of lung (HCC)   03/30/2024 Cancer Staging   Staging form: Lung, AJCC V9 - Clinical: Stage IIIB (cT2a, cN2b, cM0) - Signed by Rennie Cindy SAUNDERS, MD on 03/30/2024   04/30/2024 -  Chemotherapy   Patient is on Treatment Plan : LUNG Carboplatin  + Paclitaxel  + XRT q7d        HISTORY OF PRESENTING ILLNESS:  Henry Boone 59 y.o.  male with a history of active smoking, history of stroke with stage III-non-small cell lung cancer is here for follow-up  Discussed the use of AI scribe software for clinical note transcription with the patient, who gave verbal consent to proceed.  History of Present Illness   Henry Boone is a 59 year old male with stage III left upper lobe lung cancer undergoing concurrent chemoradiation who presents for follow-up to assess treatment tolerance and adverse effects.  He is currently  receiving concurrent chemotherapy and radiation therapy for stage III lung cancer. He reports overall good tolerance of treatment. Fatigue and cognitive impairment, described as 'tired and foggy,' occur consistently on days two and three following each chemotherapy infusion, resolving by days four through six. He manages these symptoms with rest and naps. He denies nausea or vomiting. He notes subjective improvement in dyspnea over the past several days, stating, 'I can breathe better.'  He experienced a single episode of constipation on day two after his first chemotherapy cycle, which resolved promptly with Miralax. He denies ongoing gastrointestinal symptoms. He denies new or worsening neuropathy, tingling, or numbness in the extremities, with only residual symptoms from a prior stroke. He denies abnormal bleeding, bruising, or new masses.  He is particularly sensitive to the sedating effects of antihistamines, including diphenhydramine , which contribute to his post-chemotherapy cognitive impairment and fatigue. He avoids activities requiring alertness after receiving these medications.       Review of Systems  Constitutional:  Positive for malaise/fatigue. Negative for chills, diaphoresis, fever and weight loss.  HENT:  Negative for nosebleeds and sore throat.   Eyes:  Negative for double vision.  Respiratory:  Positive for cough and shortness of breath. Negative for hemoptysis, sputum production and wheezing.   Cardiovascular:  Negative for chest pain, palpitations, orthopnea and leg swelling.  Gastrointestinal:  Negative for abdominal pain, blood in stool, constipation, diarrhea, heartburn, melena, nausea and vomiting.  Genitourinary:  Negative for dysuria, frequency and urgency.  Musculoskeletal:  Positive for back pain and joint pain.  Skin: Negative.  Negative for itching and rash.  Neurological:  Negative for dizziness, tingling, focal weakness, weakness and headaches.   Endo/Heme/Allergies:  Does not bruise/bleed easily.  Psychiatric/Behavioral:  Negative for depression. The patient is not nervous/anxious and does not have insomnia.     MEDICAL HISTORY:  Past Medical History:  Diagnosis Date   Abdominal aortic atherosclerosis    Anxiety    situational   Cancer (HCC) 2020   Complication of anesthesia    slow to wake up, anesthesia lingered for 24 hrs after , used a different anesthesia   Dyspnea    ETOH abuse    History of cerebral artery stenosis    Hyperlipidemia    Hypertension    Intracranial atherosclerosis    Pre-diabetes    Stroke [facility])     SURGICAL HISTORY: Past Surgical History:  Procedure Laterality Date   COLONOSCOPY WITH PROPOFOL  N/A 01/20/2019   Procedure: COLONOSCOPY WITH PROPOFOL ;  Surgeon: Toledo, Ladell POUR, MD;  Location: [facility];  Service: Gastroenterology;  Laterality: N/A;   ENDOBRONCHIAL ULTRASOUND Left 03/23/2024   Procedure: ENDOBRONCHIAL ULTRASOUND (EBUS);  Surgeon: Kasa, Kurian, MD;  Location: [facility] ORS;  Service: Pulmonary;  Laterality: Left;   IR ANGIO EXTRACRAN SEL COM CAROTID INNOMINATE UNI BILAT MOD SED  07/23/2017   IR ANGIO INTRA EXTRACRAN SEL COM CAROTID INNOMINATE UNI R MOD SED  11/07/2017   IR ANGIO VERTEBRAL SEL SUBCLAVIAN INNOMINATE UNI R MOD SED  11/07/2017   IR ANGIO VERTEBRAL SEL VERTEBRAL BILAT MOD SED  07/23/2017   IR CT HEAD LTD  06/11/2023   IR CT HEAD LTD  06/11/2023   IR IMAGING GUIDED PORT INSERTION  04/08/2024   IR INTRA CRAN STENT  06/11/2023   IR PERCUTANEOUS ART THROMBECTOMY/INFUSION INTRACRANIAL INC DIAG ANGIO  06/11/2023   IR RADIOLOGIST EVAL & MGMT  06/28/2023   MICROLARYNGOSCOPY N/A 06/30/2019   Procedure: SUSPENSION MICROLARYNGOSCOPY WITH MICROFLAP;  Surgeon: Milissa Hamming, MD;  Location: [facility] ORS;  Service: ENT;  Laterality: N/A;   RADIOLOGY WITH ANESTHESIA N/A 11/07/2017   Procedure: STENTING;  Surgeon: Dolphus Carrion, MD;  Location: [facility] OR;  Service: Radiology;   Laterality: N/A;   RADIOLOGY WITH ANESTHESIA N/A 06/11/2023   Procedure: IR WITH ANESTHESIA;  Surgeon: Radiologist, Medication, MD;  Location: [facility] OR;  Service: Radiology;  Laterality: N/A;   TEE WITHOUT CARDIOVERSION N/A 11/15/2014   Procedure: TRANSESOPHAGEAL ECHOCARDIOGRAM (TEE);  Surgeon: Evalene JINNY Lunger, MD;  Location: [facility] OR[facility];  Service: Cardiovascular;  Laterality: N/A;   TONSILLECTOMY     VIDEO BRONCHOSCOPY WITH ENDOBRONCHIAL NAVIGATION Bilateral 03/23/2024   Procedure: VIDEO BRONCHOSCOPY WITH ENDOBRONCHIAL NAVIGATION;  Surgeon: Isaiah Scrivener, MD;  Location: [facility] ORS;  Service: Pulmonary;  Laterality: Bilateral;    SOCIAL HISTORY: Social History   Socioeconomic History   Marital status: Significant Other    Spouse name: [name],[name] (Significant other)   Number of children: Not on file   Years of education: Not on file   Highest education level: Not on file  Occupational History   Not on file  Tobacco Use   Smoking status: Some Days    Current packs/day: 0.00    Types: Cigarettes    Last attempt to quit: 10/10/2023    Years since quitting: 0.5   Smokeless tobacco: Never   Tobacco comments:    Smokes 1-2 cigarettes occasionally when the cravings increase. Has requested a refill on nicotine  patches to use instead.  Vaping Use   Vaping status: Never Used  Substance and Sexual Activity   Alcohol use: Not Currently   Drug use: Never  Sexual activity: Yes  Other Topics Concern   Not on file  Social History Narrative   Not on file   Social Drivers of Health   Tobacco Use: High Risk (05/13/2024)   Patient History    Smoking Tobacco Use: Some Days    Smokeless Tobacco Use: Never    Passive Exposure: Not on file  Financial Resource Strain: Low Risk  (04/11/2023)   Received from [facility]   Overall Financial Resource Strain (CARDIA)    Difficulty of Paying Living Expenses: Not hard at all  Food Insecurity: No Food Insecurity (02/26/2024)   Epic     Worried About Running Out of Food in the Last Year: Never true    Ran Out of Food in the Last Year: Never true  Transportation Needs: No Transportation Needs (02/26/2024)   Epic    Lack of Transportation (Medical): No    Lack of Transportation (Non-Medical): No  Physical Activity: Not on file  Stress: Not on file  Social Connections: Not on file  Intimate Partner Violence: Not At Risk (02/26/2024)   Epic    Fear of Current or Ex-Partner: No    Emotionally Abused: No    Physically Abused: No    Sexually Abused: No  Depression (PHQ2-9): Low Risk (05/13/2024)   Depression (PHQ2-9)    PHQ-2 Score: 0  Alcohol Screen: Not on file  Housing: Low Risk (02/26/2024)   Epic    Unable to Pay for Housing in the Last Year: No    Number of Times Moved in the Last Year: 0    Homeless in the Last Year: No  Utilities: Not At Risk (02/26/2024)   Epic    Threatened with loss of utilities: No  Health Literacy: Not on file    FAMILY HISTORY: Family History  Problem Relation Age of Onset   Breast cancer Mother    Cancer Mother    Breast cancer Sister    Cancer Sister     ALLERGIES:  is allergic to paclitaxel .  MEDICATIONS:  Current Outpatient Medications  Medication Sig Dispense Refill   amLODipine  (NORVASC ) 5 MG tablet Take 1 tablet (5 mg total) by mouth in the morning.     aspirin  EC 81 MG tablet Take 81 mg by mouth daily. Swallow whole.     ezetimibe  (ZETIA ) 10 MG tablet Take 1 tablet (10 mg total) by mouth daily. 30 tablet 0   lidocaine -prilocaine  (EMLA ) cream Apply to affected area once 30 g 3   Multiple Vitamin (MULTIVITAMIN WITH MINERALS) TABS tablet Take 1 tablet by mouth daily. 30 tablet 12   ondansetron  (ZOFRAN ) 8 MG tablet Take 1 tablet (8 mg total) by mouth every 8 (eight) hours as needed for nausea or vomiting. Start on the third day after chemotherapy. 30 tablet 1   prochlorperazine  (COMPAZINE ) 10 MG tablet Take 1 tablet (10 mg total) by mouth every 6 (six) hours as needed  for nausea or vomiting. 30 tablet 1   rosuvastatin  (CRESTOR ) 40 MG tablet Take 1 tablet (40 mg total) by mouth daily. 30 tablet 0   nicotine  (NICODERM CQ  - DOSED IN MG/24 HOURS) 21 mg/24hr patch Place 1 patch (21 mg total) onto the skin daily. (Patient not taking: Reported on 05/13/2024) 28 patch 0   No current facility-administered medications for this visit.    PHYSICAL EXAMINATION:  Vitals:   05/13/24 0941  BP: 122/87  Pulse: 91  Resp: 16  Temp: (!) 95.9 F (35.5 C)  SpO2: 100%  Filed Weights   05/13/24 0941  Weight: 142 lb 1.6 oz (64.5 kg)     Physical Exam Vitals and nursing note reviewed.  HENT:     Head: Normocephalic and atraumatic.     Mouth/Throat:     Pharynx: Oropharynx is clear.  Eyes:     Extraocular Movements: Extraocular movements intact.     Pupils: Pupils are equal, round, and reactive to light.  Cardiovascular:     Rate and Rhythm: Normal rate and regular rhythm.  Pulmonary:     Comments: Decreased breath sounds bilaterally.  Abdominal:     Palpations: Abdomen is soft.  Musculoskeletal:        General: Normal range of motion.     Cervical back: Normal range of motion.  Skin:    General: Skin is warm.  Neurological:     General: No focal deficit present.     Mental Status: He is alert and oriented to person, place, and time.  Psychiatric:        Behavior: Behavior normal.        Judgment: Judgment normal.     LABORATORY DATA:  I have reviewed the data as listed Lab Results  Component Value Date   WBC 5.5 05/13/2024   HGB 12.9 (L) 05/13/2024   HCT 38.2 (L) 05/13/2024   MCV 88.6 05/13/2024   PLT 218 05/13/2024   Recent Labs    04/30/24 0834 05/06/24 0838 05/13/24 0945  NA 140 138 142  K 4.1 4.0 4.0  CL 104 103 106  CO2 26 26 25   GLUCOSE 93 105* 96  BUN 6 14 9   CREATININE 0.83 0.68 0.73  CALCIUM  9.9 9.4 9.3  GFRNONAA >60 >60 >60  PROT 6.7 6.4* 6.1*  ALBUMIN  4.4 4.3 4.1  AST 23 21 23   ALT 21 22 29   ALKPHOS 78 68 73   BILITOT 0.5 0.6 0.4    RADIOGRAPHIC STUDIES: I have personally reviewed the radiological images as listed and agreed with the findings in the report. No results found.    Primary cancer of left upper lobe of lung (HCC) # NOV-DEC 2025-incidental.  CTA head and neck - Advanced emphysema with a slowly enlarging left upper lobe lung nodule now up to 2.1 cm;  Lung, biopsy, LUL :  CARCINOMA CONSISTENT WITH ADENOCARCINOMA [Dr.Kasa]. NOV 2025-  Hypermetabolic left upper lobe nodule compatible with malignancy;  Hypermetabolic left hilar and mediastinal adenopathy compatible with malignancy. Subsolid right upper lobe nodule with low-grade metabolic. Stage III- T1N2 Vs Stage ? IV [contralateral]. MRI brain- NOV 2025- NEG for metastases. S/p  Dr. Kerrin- NOt surgical candaiate. . Carbo-taxol  weekly- RT- adjuvant immunotherapy.  # proceed with cycle # 3 of planned 6-8 weeks with concurrent RT [until 06/15/2024]. Labs-CBC/chemistries were reviewed with the patient.  # Brain fog: cut down benadryl  to 25 mg/day.   # constipation- on mirlax prn. Improved. .   # Advanced COPD based on imaging [ not symptomatic]-   # Hx of stroke [Dr.Heck; [place]]-on Brilinta  plus aspirin .  Last stroke as per patient June 2025-chronic tingling and numbness of the extremities residual from stroke- stable.   # Nutrition s/p evaluation malnutrition   # Active smoker: Patient has been trying to quit smoking.  PS; MD;Weekly-MD/labs x 3- chemo-  # DISPOSITION:  # chem today-wait for Cmp # follow up  as per IS- -Dr.B  Above plan of care was discussed with patient/family in detail.  My contact information was given to the patient/family.  Cindy JONELLE Joe, MD 05/13/2024 10:41 AM

## 2024-05-14 ENCOUNTER — Other Ambulatory Visit: Payer: Self-pay

## 2024-05-14 ENCOUNTER — Encounter: Payer: Self-pay | Admitting: *Deleted

## 2024-05-14 ENCOUNTER — Ambulatory Visit
Admission: RE | Admit: 2024-05-14 | Discharge: 2024-05-14 | Disposition: A | Discharge status: Home / Self Care | Source: Ambulatory Visit | Attending: Radiation Oncology | Admitting: Radiation Oncology

## 2024-05-14 LAB — RAD ONC ARIA SESSION SUMMARY
Course Elapsed Days: 14
Plan Fractions Treated to Date: 9
Plan Prescribed Dose Per Fraction: 2 Gy
Plan Total Fractions Prescribed: 33
Plan Total Prescribed Dose: 66 Gy
Reference Point Dosage Given to Date: 18 Gy
Reference Point Session Dosage Given: 2 Gy
Session Number: 9

## 2024-05-15 ENCOUNTER — Other Ambulatory Visit: Payer: Self-pay

## 2024-05-15 ENCOUNTER — Ambulatory Visit
Admission: RE | Admit: 2024-05-15 | Discharge: 2024-05-15 | Disposition: A | Source: Ambulatory Visit | Attending: Radiation Oncology | Admitting: Radiation Oncology

## 2024-05-15 LAB — RAD ONC ARIA SESSION SUMMARY
Course Elapsed Days: 15
Plan Fractions Treated to Date: 10
Plan Prescribed Dose Per Fraction: 2 Gy
Plan Total Fractions Prescribed: 33
Plan Total Prescribed Dose: 66 Gy
Reference Point Dosage Given to Date: 20 Gy
Reference Point Session Dosage Given: 2 Gy
Session Number: 10

## 2024-05-18 ENCOUNTER — Ambulatory Visit

## 2024-05-19 ENCOUNTER — Ambulatory Visit

## 2024-05-19 ENCOUNTER — Other Ambulatory Visit: Payer: Self-pay

## 2024-05-19 ENCOUNTER — Ambulatory Visit
Admission: RE | Admit: 2024-05-19 | Discharge: 2024-05-19 | Attending: Radiation Oncology | Admitting: Radiation Oncology

## 2024-05-19 LAB — RAD ONC ARIA SESSION SUMMARY
Course Elapsed Days: 19
Plan Fractions Treated to Date: 11
Plan Prescribed Dose Per Fraction: 2 Gy
Plan Total Fractions Prescribed: 33
Plan Total Prescribed Dose: 66 Gy
Reference Point Dosage Given to Date: 22 Gy
Reference Point Session Dosage Given: 2 Gy
Session Number: 11

## 2024-05-20 ENCOUNTER — Inpatient Hospital Stay: Payer: Self-pay | Attending: Internal Medicine | Admitting: Internal Medicine

## 2024-05-20 ENCOUNTER — Inpatient Hospital Stay: Payer: Self-pay | Attending: Internal Medicine

## 2024-05-20 ENCOUNTER — Ambulatory Visit
Admission: RE | Admit: 2024-05-20 | Discharge: 2024-05-20 | Disposition: A | Source: Ambulatory Visit | Attending: Radiation Oncology | Admitting: Radiation Oncology

## 2024-05-20 ENCOUNTER — Inpatient Hospital Stay: Payer: Self-pay

## 2024-05-20 ENCOUNTER — Other Ambulatory Visit: Payer: Self-pay

## 2024-05-20 ENCOUNTER — Encounter: Payer: Self-pay | Admitting: Internal Medicine

## 2024-05-20 VITALS — BP 127/84 | HR 87 | Temp 96.3°F | Resp 16 | Ht 72.0 in | Wt 143.1 lb

## 2024-05-20 DIAGNOSIS — C3412 Malignant neoplasm of upper lobe, left bronchus or lung: Secondary | ICD-10-CM | POA: Diagnosis not present

## 2024-05-20 LAB — CBC WITH DIFFERENTIAL (CANCER CENTER ONLY)
Abs Immature Granulocytes: 0.03 10*3/uL (ref 0.00–0.07)
Basophils Absolute: 0 10*3/uL (ref 0.0–0.1)
Basophils Relative: 1 %
Eosinophils Absolute: 0 10*3/uL (ref 0.0–0.5)
Eosinophils Relative: 1 %
HCT: 37 % — ABNORMAL LOW (ref 39.0–52.0)
Hemoglobin: 12.6 g/dL — ABNORMAL LOW (ref 13.0–17.0)
Immature Granulocytes: 1 %
Lymphocytes Relative: 16 %
Lymphs Abs: 0.7 10*3/uL (ref 0.7–4.0)
MCH: 30.4 pg (ref 26.0–34.0)
MCHC: 34.1 g/dL (ref 30.0–36.0)
MCV: 89.2 fL (ref 80.0–100.0)
Monocytes Absolute: 0.4 10*3/uL (ref 0.1–1.0)
Monocytes Relative: 8 %
Neutro Abs: 3.2 10*3/uL (ref 1.7–7.7)
Neutrophils Relative %: 73 %
Platelet Count: 190 10*3/uL (ref 150–400)
RBC: 4.15 MIL/uL — ABNORMAL LOW (ref 4.22–5.81)
RDW: 12.6 % (ref 11.5–15.5)
WBC Count: 4.3 10*3/uL (ref 4.0–10.5)
nRBC: 0 % (ref 0.0–0.2)

## 2024-05-20 LAB — RAD ONC ARIA SESSION SUMMARY
Course Elapsed Days: 20
Plan Fractions Treated to Date: 12
Plan Prescribed Dose Per Fraction: 2 Gy
Plan Total Fractions Prescribed: 33
Plan Total Prescribed Dose: 66 Gy
Reference Point Dosage Given to Date: 24 Gy
Reference Point Session Dosage Given: 2 Gy
Session Number: 12

## 2024-05-20 LAB — CMP (CANCER CENTER ONLY)
ALT: 24 U/L (ref 0–44)
AST: 20 U/L (ref 15–41)
Albumin: 4.2 g/dL (ref 3.5–5.0)
Alkaline Phosphatase: 67 U/L (ref 38–126)
Anion gap: 11 (ref 5–15)
BUN: 7 mg/dL (ref 6–20)
CO2: 23 mmol/L (ref 22–32)
Calcium: 9.3 mg/dL (ref 8.9–10.3)
Chloride: 106 mmol/L (ref 98–111)
Creatinine: 0.7 mg/dL (ref 0.61–1.24)
GFR, Estimated: >60 mL/min
Glucose, Bld: 108 mg/dL — ABNORMAL HIGH (ref 70–99)
Potassium: 4.6 mmol/L (ref 3.5–5.1)
Sodium: 140 mmol/L (ref 135–145)
Total Bilirubin: 0.4 mg/dL (ref 0.0–1.2)
Total Protein: 6.1 g/dL — ABNORMAL LOW (ref 6.5–8.1)

## 2024-05-20 MED ORDER — SODIUM CHLORIDE 0.9 % IV SOLN
232.0000 mg | Freq: Once | INTRAVENOUS | Status: AC
  Administered 2024-05-20: 230 mg via INTRAVENOUS
  Filled 2024-05-20: qty 23

## 2024-05-20 MED ORDER — AMOXICILLIN 500 MG PO CAPS: 500.0000 mg | ORAL_CAPSULE | Freq: Three times a day (TID) | ORAL | 0 refills | Status: AC

## 2024-05-20 MED ORDER — DEXAMETHASONE SOD PHOSPHATE PF 10 MG/ML IJ SOLN
10.0000 mg | Freq: Once | INTRAMUSCULAR | Status: AC
  Administered 2024-05-20: 10 mg via INTRAVENOUS
  Filled 2024-05-20: qty 1

## 2024-05-20 MED ORDER — FAMOTIDINE IN NACL 20-0.9 MG/50ML-% IV SOLN
20.0000 mg | Freq: Once | INTRAVENOUS | Status: AC
  Administered 2024-05-20: 20 mg via INTRAVENOUS
  Filled 2024-05-20: qty 50

## 2024-05-20 MED ORDER — SODIUM CHLORIDE 0.9 % IV SOLN
INTRAVENOUS | Status: DC
  Filled 2024-05-20: qty 250

## 2024-05-20 MED ORDER — DIPHENHYDRAMINE HCL 50 MG/ML IJ SOLN
25.0000 mg | Freq: Once | INTRAMUSCULAR | Status: AC
  Administered 2024-05-20: 25 mg via INTRAVENOUS
  Filled 2024-05-20: qty 1

## 2024-05-20 MED ORDER — PALONOSETRON HCL INJECTION 0.25 MG/5ML
0.2500 mg | Freq: Once | INTRAVENOUS | Status: AC
  Administered 2024-05-20: 0.25 mg via INTRAVENOUS
  Filled 2024-05-20: qty 5

## 2024-05-20 MED ORDER — SODIUM CHLORIDE 0.9 % IV SOLN
45.0000 mg/m2 | Freq: Once | INTRAVENOUS | Status: AC
  Administered 2024-05-20: 78 mg via INTRAVENOUS
  Filled 2024-05-20: qty 13

## 2024-05-20 NOTE — Progress Notes (Signed)
 How long will brian fog, and fatigue last after his treatment?

## 2024-05-20 NOTE — Patient Instructions (Addendum)
 [facility] - A DEPT OF [facility]  Discharge Instructions: Thank you for choosing [facility] to provide your oncology and hematology care.  If you have a lab appointment with the Cancer Center, please go directly to the Cancer Center and check in at the registration area.  Wear comfortable clothing and clothing appropriate for easy access to any Portacath or PICC line.   We strive to give you quality time with your provider. You may need to reschedule your appointment if you arrive late (15 or more minutes).  Arriving late affects you and other patients whose appointments are after yours.  Also, if you miss three or more appointments without notifying the office, you may be dismissed from the clinic at the providers discretion.      For prescription refill requests, have your pharmacy contact our office and allow 72 hours for refills to be completed.    Today you received the following chemotherapy and/or immunotherapy agents Taxol , Carboplatin       To help prevent nausea and vomiting after your treatment, we encourage you to take your nausea medication as directed.  BELOW ARE SYMPTOMS THAT SHOULD BE REPORTED IMMEDIATELY: *FEVER GREATER THAN 100.4 F (38 C) OR HIGHER *CHILLS OR SWEATING *NAUSEA AND VOMITING THAT IS NOT CONTROLLED WITH YOUR NAUSEA MEDICATION *UNUSUAL SHORTNESS OF BREATH *UNUSUAL BRUISING OR BLEEDING *URINARY PROBLEMS (pain or burning when urinating, or frequent urination) *BOWEL PROBLEMS (unusual diarrhea, constipation, pain near the anus) TENDERNESS IN MOUTH AND THROAT WITH OR WITHOUT PRESENCE OF ULCERS (sore throat, sores in mouth, or a toothache) UNUSUAL RASH, SWELLING OR PAIN  UNUSUAL VAGINAL DISCHARGE OR ITCHING   Items with * indicate a potential emergency and should be followed up as soon as possible or go to the Emergency Department if any problems should occur.  Please show the CHEMOTHERAPY ALERT CARD or  IMMUNOTHERAPY ALERT CARD at check-in to the Emergency Department and triage nurse.  Should you have questions after your visit or need to cancel or reschedule your appointment, please contact [facility] - A DEPT OF [facility]  [phone] and follow the prompts.  Office hours are 8:00 a.m. to 4:30 p.m. Monday - Friday. Please note that voicemails left after 4:00 p.m. may not be returned until the following business day.  We are closed weekends and major holidays. You have access to a nurse at all times for urgent questions. Please call the main number to the clinic [phone] and follow the prompts.  For any non-urgent questions, you may also contact your provider using MyChart. We now offer e-Visits for anyone 68 and older to request care online for non-urgent symptoms. For details visit [email or web address].   Also download the MyChart app! Go to the app store, search MyChart, open the app, select [place], and log in with your MyChart username and password.  Paclitaxel  Injection What is this medication? PACLITAXEL  (PAK li TAX el) treats some types of cancer. It works by slowing down the growth of cancer cells. This medicine may be used for other purposes; ask your health care provider or pharmacist if you have questions. COMMON BRAND NAME(S): Onxol, Taxol  What should I tell my care team before I take this medication? They need to know if you have any of these conditions: Heart disease Liver disease Low white blood cell levels An unusual or allergic reaction to paclitaxel , other medications, foods, dyes, or preservatives If you  or your partner are pregnant or trying to get pregnant Breast-feeding How should I use this medication? This medication is infused into a vein. It is given by your care team in a hospital or clinic setting. Talk to your care team about the use of this medication in children. Special care may be needed. Overdosage: If you  think you have taken too much of this medicine contact a poison control center or emergency room at once. NOTE: This medicine is only for you. Do not share this medicine with others. What if I miss a dose? Keep appointments for follow-up doses. It is important not to miss your dose. Call your care team if you are unable to keep an appointment. What may interact with this medication? Do not take this medication with any of the following: Live virus vaccines Other medications may affect the way this medication works. Talk with your care team about all of the medications you take. They may suggest changes to your treatment plan to lower the risk of side effects and to make sure your medications work as intended. This list may not describe all possible interactions. Give your health care provider a list of all the medicines, herbs, non-prescription drugs, or dietary supplements you use. Also tell them if you smoke, drink alcohol, or use illegal drugs. Some items may interact with your medicine. What should I watch for while using this medication? Your condition will be monitored carefully while you are receiving this medication. You may need blood work while taking this medication. This medication may make you feel generally unwell. This is not uncommon as chemotherapy can affect healthy cells as well as cancer cells. Report any side effects. Continue your course of treatment even though you feel ill unless your care team tells you to stop. This medication can cause serious allergic reactions. To reduce the risk, your care team may give you other medications to take before receiving this one. Be sure to follow the directions from your care team. This medication may increase your risk of getting an infection. Call your care team for advice if you get a fever, chills, sore throat, or other symptoms of a cold or flu. Do not treat yourself. Try to avoid being around people who are sick. This medication may  increase your risk to bruise or bleed. Call your care team if you notice any unusual bleeding. Be careful brushing or flossing your teeth or using a toothpick because you may get an infection or bleed more easily. If you have any dental work done, tell your dentist you are receiving this medication. Talk to your care team if you may be pregnant. Serious birth defects can occur if you take this medication during pregnancy. Talk to your care team before breastfeeding. Changes to your treatment plan may be needed. What side effects may I notice from receiving this medication? Side effects that you should report to your care team as soon as possible: Allergic reactions--skin rash, itching, hives, swelling of the face, lips, tongue, or throat Heart rhythm changes--fast or irregular heartbeat, dizziness, feeling faint or lightheaded, chest pain, trouble breathing Increase in blood pressure Infection--fever, chills, cough, sore throat, wounds that don't heal, pain or trouble when passing urine, general feeling of discomfort or being unwell Low blood pressure--dizziness, feeling faint or lightheaded, blurry vision Low red blood cell level--unusual weakness or fatigue, dizziness, headache, trouble breathing Painful swelling, warmth, or redness of the skin, blisters or sores at the infusion site Pain, tingling, or numbness  in the hands or feet Slow heartbeat--dizziness, feeling faint or lightheaded, confusion, trouble breathing, unusual weakness or fatigue Unusual bruising or bleeding Side effects that usually do not require medical attention (report to your care team if they continue or are bothersome): Diarrhea Hair loss Joint pain Loss of appetite Muscle pain Nausea Vomiting This list may not describe all possible side effects. Call your doctor for medical advice about side effects. You may report side effects to FDA at 1-800-FDA-1088. Where should I keep my medication? This medication is given in  a hospital or clinic. It will not be stored at home. NOTE: This sheet is a summary. It may not cover all possible information. If you have questions about this medicine, talk to your doctor, pharmacist, or health care provider.  2025 Elsevier/Gold Standard (2024-02-06 00:00:00)  Carboplatin  Injection What is this medication? CARBOPLATIN  (KAR boe pla tin) treats some types of cancer. It works by slowing down the growth of cancer cells. This medicine may be used for other purposes; ask your health care provider or pharmacist if you have questions. COMMON BRAND NAME(S): Paraplatin  What should I tell my care team before I take this medication? They need to know if you have any of these conditions: Blood disorders Hearing problems Kidney disease Recent or ongoing radiation therapy An unusual or allergic reaction to carboplatin , cisplatin, other medications, foods, dyes, or preservatives Pregnant or trying to get pregnant Breast-feeding How should I use this medication? This medication is injected into a vein. It is given by your care team in a hospital or clinic setting. Talk to your care team about the use of this medication in children. Special care may be needed. Overdosage: If you think you have taken too much of this medicine contact a poison control center or emergency room at once. NOTE: This medicine is only for you. Do not share this medicine with others. What if I miss a dose? Keep appointments for follow-up doses. It is important not to miss your dose. Call your care team if you are unable to keep an appointment. What may interact with this medication? Medications for seizures Some antibiotics, such as amikacin, gentamicin, neomycin, streptomycin, tobramycin Vaccines This list may not describe all possible interactions. Give your health care provider a list of all the medicines, herbs, non-prescription drugs, or dietary supplements you use. Also tell them if you smoke, drink  alcohol, or use illegal drugs. Some items may interact with your medicine. What should I watch for while using this medication? Your condition will be monitored carefully while you are receiving this medication. You may need blood work while taking this medication. This medication may make you feel generally unwell. This is not uncommon, as chemotherapy can affect healthy cells as well as cancer cells. Report any side effects. Continue your course of treatment even though you feel ill unless your care team tells you to stop. In some cases, you may be given additional medications to help with side effects. Follow all directions for their use. This medication may increase your risk of getting an infection. Call your care team for advice if you get a fever, chills, sore throat, or other symptoms of a cold or flu. Do not treat yourself. Try to avoid being around people who are sick. Avoid taking medications that contain aspirin , acetaminophen , ibuprofen, naproxen, or ketoprofen unless instructed by your care team. These medications may hide a fever. Be careful brushing or flossing your teeth or using a toothpick because you may get an  infection or bleed more easily. If you have any dental work done, tell your dentist you are receiving this medication. Talk to your care team if you wish to become pregnant or think you might be pregnant. This medication can cause serious birth defects. Talk to your care team about effective forms of contraception. Do not breast-feed while taking this medication. What side effects may I notice from receiving this medication? Side effects that you should report to your care team as soon as possible: Allergic reactions--skin rash, itching, hives, swelling of the face, lips, tongue, or throat Infection--fever, chills, cough, sore throat, wounds that don't heal, pain or trouble when passing urine, general feeling of discomfort or being unwell Low red blood cell level--unusual  weakness or fatigue, dizziness, headache, trouble breathing Pain, tingling, or numbness in the hands or feet, muscle weakness, change in vision, confusion or trouble speaking, loss of balance or coordination, trouble walking, seizures Unusual bruising or bleeding Side effects that usually do not require medical attention (report to your care team if they continue or are bothersome): Hair loss Nausea Unusual weakness or fatigue Vomiting This list may not describe all possible side effects. Call your doctor for medical advice about side effects. You may report side effects to FDA at 1-800-FDA-1088. Where should I keep my medication? This medication is given in a hospital or clinic. It will not be stored at home. NOTE: This sheet is a summary. It may not cover all possible information. If you have questions about this medicine, talk to your doctor, pharmacist, or health care provider.  2024 Elsevier/Gold Standard (2021-07-25 00:00:00)

## 2024-05-20 NOTE — Progress Notes (Signed)
 Nutrition Follow-up:  Patient with stage IIIb lung cancer.  Starting concurrent chemotherapy and radiation.   Met with patient during infusion.  Reports that his appetite has increased some.  He has been drinking 2 ensure plus shakes daily in addition to trying to eat 3 meals a day.  Breakfast maybe cereal and fruit or ramen noodles with egg.  Lunch maybe sandwich, yesterday was Spaghetti o's.  Dinner has been [employer] or chicken strips.  Has been getting food out due to fatigue.  Drinking water mostly, some soda   Medications: reviewed  Labs: reviewed  Anthropometrics:   Weight 143 lb 1.6 oz today 136 lb 14.4 oz on 1/15 137 lb 12/24 145 lb 06/11/23  160-170 lb year and half ago per patient  NUTRITION DIAGNOSIS: Inadequate oral intake improving   INTERVENTION:  Continue ensure plus BID Encouraged adding more vegetables in diet    MONITORING, EVALUATION, GOAL: weight trends, intake   NEXT VISIT: Wed, Feb 25 during infusion  Jameica Couts B. Dasie SOLON, CSO, LDN Registered Dietitian [phone]

## 2024-05-20 NOTE — Assessment & Plan Note (Addendum)
#   NOV-DEC 2025-incidental.  CTA head and neck - Advanced emphysema with a slowly enlarging left upper lobe lung nodule now up to 2.1 cm;  Lung, biopsy, LUL :  CARCINOMA CONSISTENT WITH ADENOCARCINOMA [Dr.Kasa]. NOV 2025-  Hypermetabolic left upper lobe nodule compatible with malignancy;  Hypermetabolic left hilar and mediastinal adenopathy compatible with malignancy. Subsolid right upper lobe nodule with low-grade metabolic. Stage III- T1N2 Vs Stage ? IV [contralateral]. MRI brain- NOV 2025- NEG for metastases. S/p  Dr. Kerrin- NOt surgical candaiate. . Carbo-taxol  weekly- RT- adjuvant immunotherapy.  # proceed with cycle # 4 of planned 6-8 weeks with concurrent RT [until 06/15/2024]. Labs-CBC/chemistries were reviewed with the patient.  # right facial boil- recommend amoxicillin  500 mg TID x 5 days  # Brain fog: cut down benadryl  to 25 mg/day.- improved monitor for now.   # constipation- on mirlax prn.  stable.   # Advanced COPD based on imaging [ not symptomatic]-  stable.   # Hx of stroke [Dr.Heck; [place]]-on Brilinta  plus aspirin .  Last stroke as per patient June 2025-chronic tingling and numbness of the extremities residual from stroke- stable.   # Nutrition s/p evaluation malnutrition   # Active smoker: Patient has been trying to quit smoking.  PS; MD;Weekly-MD/labs x 3- chemo-  # DISPOSITION:  # chem today-wait for Cmp # follow up  as per IS- -Dr.B

## 2024-05-20 NOTE — Progress Notes (Signed)
 [facility] CONSULT NOTE  Patient Care Team: [name], MD as PCP - General (Family Medicine) Verdene Gills, RN as Oncology Nurse Navigator Rennie Cindy SAUNDERS, MD as Consulting Physician (Oncology)  CHIEF COMPLAINTS/PURPOSE OF CONSULTATION: Lung cancer  Oncology History Overview Note  # NOV-DEC 2025-incidental.  CTA head and neck - Advanced emphysema with a slowly enlarging left upper lobe lung nodule now up to 2.1 cm; 1. Lung, biopsy, LUL :  CARCINOMA CONSISTENT WITH ADENOCARCINOMA [Dr.Kasa]. NOV 2025-  Hypermetabolic left upper lobe nodule compatible with malignancy;  Hypermetabolic left hilar and mediastinal adenopathy compatible with malignancy. Subsolid right upper lobe nodule with low-grade metabolic. Stage III- T1N2 Vs Stage ? IV [contralateral]. MRI brain- NOV 2025- NEG for metastases. S/p  Dr. Kerrin- NOt surgical candaiate. . Carbo-taxol  weekly- RT- adjuvant immunotherapy.  # JAN 2026- 14th- carbo-taxol  #1- RT until 06/15/2024   Primary cancer of left upper lobe of lung (HCC)  03/30/2024 Initial Diagnosis   Primary cancer of left upper lobe of lung (HCC)   03/30/2024 Cancer Staging   Staging form: Lung, AJCC V9 - Clinical: Stage IIIB (cT2a, cN2b, cM0) - Signed by Rennie Cindy SAUNDERS, MD on 03/30/2024   04/30/2024 -  Chemotherapy   Patient is on Treatment Plan : LUNG Carboplatin  + Paclitaxel  + XRT q7d        HISTORY OF PRESENTING ILLNESS:  Henry Boone 59 y.o.  male with a history of active smoking, history of stroke with stage III-non-small cell lung cancer is here for follow-up  Discussed the use of AI scribe software for clinical note transcription with the patient, who gave verbal consent to proceed.  History of Present Illness   Henry Boone is a 59 year old male with stage III lung cancer undergoing chemoradiation who presents for evaluation of a new cutaneous abscess during chemotherapy.  He is currently receiving his fourth cycle of  chemoradiation for stage III lung cancer, with a planned total of six to eight cycles. He reports less cognitive impairment following this cycle compared to previous treatments, but experienced prolonged fatigue on day three post-treatment, which he associates with inclement weather. He denies dysphagia, odynophagia, or other new symptoms.  Over the past two days, he developed a facial pustule resembling acne, which he does not attribute to radiation therapy and has experienced previously. He typically manages these lesions at home with drainage and antibacterial soap. No similar lesions are present elsewhere. He denies diarrhea with prior amoxicillin  use and has no history of antibiotic allergies. He has previously tolerated amoxicillin , tobramycin, and penicillin for dental issues without complications.  He continues to use Ensure and snacks for nutritional support, though he did not have breakfast prior to today's visit due to the early appointment time.      Review of Systems  Constitutional:  Positive for malaise/fatigue. Negative for chills, diaphoresis, fever and weight loss.  HENT:  Negative for nosebleeds and sore throat.   Eyes:  Negative for double vision.  Respiratory:  Negative for hemoptysis, sputum production and wheezing.   Cardiovascular:  Negative for chest pain, palpitations, orthopnea and leg swelling.  Gastrointestinal:  Negative for abdominal pain, blood in stool, constipation, diarrhea, heartburn, melena, nausea and vomiting.  Genitourinary:  Negative for dysuria, frequency and urgency.  Skin: Negative.  Negative for itching and rash.  Neurological:  Negative for dizziness, tingling, focal weakness, weakness and headaches.  Endo/Heme/Allergies:  Does not bruise/bleed easily.  Psychiatric/Behavioral:  Negative for depression. The patient is not nervous/anxious and  does not have insomnia.     MEDICAL HISTORY:  Past Medical History:  Diagnosis Date   Abdominal aortic  atherosclerosis    Anxiety    situational   Cancer (HCC) 2020   Complication of anesthesia    slow to wake up, anesthesia lingered for 24 hrs after , used a different anesthesia   Dyspnea    ETOH abuse    History of cerebral artery stenosis    Hyperlipidemia    Hypertension    Intracranial atherosclerosis    Pre-diabetes    Stroke [facility])     SURGICAL HISTORY: Past Surgical History:  Procedure Laterality Date   COLONOSCOPY WITH PROPOFOL  N/A 01/20/2019   Procedure: COLONOSCOPY WITH PROPOFOL ;  Surgeon: Toledo, Ladell POUR, MD;  Location: [facility] ENDOSCOPY;  Service: Gastroenterology;  Laterality: N/A;   ENDOBRONCHIAL ULTRASOUND Left 03/23/2024   Procedure: ENDOBRONCHIAL ULTRASOUND (EBUS);  Surgeon: Kasa, Kurian, MD;  Location: [facility] ORS;  Service: Pulmonary;  Laterality: Left;   IR ANGIO EXTRACRAN SEL COM CAROTID INNOMINATE UNI BILAT MOD SED  07/23/2017   IR ANGIO INTRA EXTRACRAN SEL COM CAROTID INNOMINATE UNI R MOD SED  11/07/2017   IR ANGIO VERTEBRAL SEL SUBCLAVIAN INNOMINATE UNI R MOD SED  11/07/2017   IR ANGIO VERTEBRAL SEL VERTEBRAL BILAT MOD SED  07/23/2017   IR CT HEAD LTD  06/11/2023   IR CT HEAD LTD  06/11/2023   IR IMAGING GUIDED PORT INSERTION  04/08/2024   IR INTRA CRAN STENT  06/11/2023   IR PERCUTANEOUS ART THROMBECTOMY/INFUSION INTRACRANIAL INC DIAG ANGIO  06/11/2023   IR RADIOLOGIST EVAL & MGMT  06/28/2023   MICROLARYNGOSCOPY N/A 06/30/2019   Procedure: SUSPENSION MICROLARYNGOSCOPY WITH MICROFLAP;  Surgeon: Milissa Hamming, MD;  Location: [facility] ORS;  Service: ENT;  Laterality: N/A;   RADIOLOGY WITH ANESTHESIA N/A 11/07/2017   Procedure: STENTING;  Surgeon: Dolphus Carrion, MD;  Location: [facility] OR;  Service: Radiology;  Laterality: N/A;   RADIOLOGY WITH ANESTHESIA N/A 06/11/2023   Procedure: IR WITH ANESTHESIA;  Surgeon: Radiologist, Medication, MD;  Location: [facility] OR;  Service: Radiology;  Laterality: N/A;   TEE WITHOUT CARDIOVERSION N/A 11/15/2014   Procedure:  TRANSESOPHAGEAL ECHOCARDIOGRAM (TEE);  Surgeon: Evalene JINNY Lunger, MD;  Location: [facility] ORS;  Service: Cardiovascular;  Laterality: N/A;   TONSILLECTOMY     VIDEO BRONCHOSCOPY WITH ENDOBRONCHIAL NAVIGATION Bilateral 03/23/2024   Procedure: VIDEO BRONCHOSCOPY WITH ENDOBRONCHIAL NAVIGATION;  Surgeon: Isaiah Scrivener, MD;  Location: [facility] ORS;  Service: Pulmonary;  Laterality: Bilateral;    SOCIAL HISTORY: Social History   Socioeconomic History   Marital status: Significant Other    Spouse name: [name],[name] (Significant other)   Number of children: Not on file   Years of education: Not on file   Highest education level: Not on file  Occupational History   Not on file  Tobacco Use   Smoking status: Some Days    Current packs/day: 0.00    Types: Cigarettes    Last attempt to quit: 10/10/2023    Years since quitting: 0.6   Smokeless tobacco: Never   Tobacco comments:    Smokes 1-2 cigarettes occasionally when the cravings increase. Has requested a refill on nicotine  patches to use instead.  Vaping Use   Vaping status: Never Used  Substance and Sexual Activity   Alcohol use: Not Currently   Drug use: Never   Sexual activity: Yes  Other Topics Concern   Not on file  Social History Narrative   Not on file   Social Drivers of  Health   Tobacco Use: High Risk (05/20/2024)   Patient History    Smoking Tobacco Use: Some Days    Smokeless Tobacco Use: Never    Passive Exposure: Not on file  Financial Resource Strain: Low Risk  (04/11/2023)   Received from [facility]   Overall Financial Resource Strain (CARDIA)    Difficulty of Paying Living Expenses: Not hard at all  Food Insecurity: No Food Insecurity (02/26/2024)   Epic    Worried About Running Out of Food in the Last Year: Never true    Ran Out of Food in the Last Year: Never true  Transportation Needs: No Transportation Needs (02/26/2024)   Epic    Lack of Transportation (Medical): No    Lack of  Transportation (Non-Medical): No  Physical Activity: Not on file  Stress: Not on file  Social Connections: Not on file  Intimate Partner Violence: Not At Risk (02/26/2024)   Epic    Fear of Current or Ex-Partner: No    Emotionally Abused: No    Physically Abused: No    Sexually Abused: No  Depression (PHQ2-9): Low Risk (05/20/2024)   Depression (PHQ2-9)    PHQ-2 Score: 0  Alcohol Screen: Not on file  Housing: Low Risk (02/26/2024)   Epic    Unable to Pay for Housing in the Last Year: No    Number of Times Moved in the Last Year: 0    Homeless in the Last Year: No  Utilities: Not At Risk (02/26/2024)   Epic    Threatened with loss of utilities: No  Health Literacy: Not on file    FAMILY HISTORY: Family History  Problem Relation Age of Onset   Breast cancer Mother    Cancer Mother    Breast cancer Sister    Cancer Sister     ALLERGIES:  is allergic to paclitaxel .  MEDICATIONS:  Current Outpatient Medications  Medication Sig Dispense Refill   amLODipine  (NORVASC ) 5 MG tablet Take 1 tablet (5 mg total) by mouth in the morning.     amoxicillin  (AMOXIL ) 500 MG capsule Take 1 capsule (500 mg total) by mouth 3 (three) times daily. 15 capsule 0   aspirin  EC 81 MG tablet Take 81 mg by mouth daily. Swallow whole.     ezetimibe  (ZETIA ) 10 MG tablet Take 1 tablet (10 mg total) by mouth daily. 30 tablet 0   lidocaine -prilocaine  (EMLA ) cream Apply to affected area once 30 g 3   Multiple Vitamin (MULTIVITAMIN WITH MINERALS) TABS tablet Take 1 tablet by mouth daily. 30 tablet 12   ondansetron  (ZOFRAN ) 8 MG tablet Take 1 tablet (8 mg total) by mouth every 8 (eight) hours as needed for nausea or vomiting. Start on the third day after chemotherapy. 30 tablet 1   prochlorperazine  (COMPAZINE ) 10 MG tablet Take 1 tablet (10 mg total) by mouth every 6 (six) hours as needed for nausea or vomiting. 30 tablet 1   rosuvastatin  (CRESTOR ) 40 MG tablet Take 1 tablet (40 mg total) by mouth daily. 30  tablet 0   nicotine  (NICODERM CQ  - DOSED IN MG/24 HOURS) 21 mg/24hr patch Place 1 patch (21 mg total) onto the skin daily. (Patient not taking: Reported on 05/20/2024) 28 patch 0   No current facility-administered medications for this visit.    PHYSICAL EXAMINATION:  Vitals:   05/20/24 0849  BP: 127/84  Pulse: 87  Resp: 16  Temp: (!) 96.3 F (35.7 C)  SpO2: 100%    Filed  Weights   05/20/24 0849  Weight: 143 lb 1.6 oz (64.9 kg)   Right side of the face folliculitis noted/bump noted fluctuant.  No drainage.  Physical Exam Vitals and nursing note reviewed.  HENT:     Head: Normocephalic and atraumatic.     Mouth/Throat:     Pharynx: Oropharynx is clear.  Eyes:     Extraocular Movements: Extraocular movements intact.     Pupils: Pupils are equal, round, and reactive to light.  Cardiovascular:     Rate and Rhythm: Normal rate and regular rhythm.  Pulmonary:     Comments: Decreased breath sounds bilaterally.  Abdominal:     Palpations: Abdomen is soft.  Musculoskeletal:        General: Normal range of motion.     Cervical back: Normal range of motion.  Skin:    General: Skin is warm.  Neurological:     General: No focal deficit present.     Mental Status: He is alert and oriented to person, place, and time.  Psychiatric:        Behavior: Behavior normal.        Judgment: Judgment normal.     LABORATORY DATA:  I have reviewed the data as listed Lab Results  Component Value Date   WBC 4.3 05/20/2024   HGB 12.6 (L) 05/20/2024   HCT 37.0 (L) 05/20/2024   MCV 89.2 05/20/2024   PLT 190 05/20/2024   Recent Labs    04/30/24 0834 05/06/24 0838 05/13/24 0945  NA 140 138 142  K 4.1 4.0 4.0  CL 104 103 106  CO2 26 26 25   GLUCOSE 93 105* 96  BUN 6 14 9   CREATININE 0.83 0.68 0.73  CALCIUM  9.9 9.4 9.3  GFRNONAA >60 >60 >60  PROT 6.7 6.4* 6.1*  ALBUMIN  4.4 4.3 4.1  AST 23 21 23   ALT 21 22 29   ALKPHOS 78 68 73  BILITOT 0.5 0.6 0.4    RADIOGRAPHIC  STUDIES: I have personally reviewed the radiological images as listed and agreed with the findings in the report. No results found.    Primary cancer of left upper lobe of lung (HCC) # NOV-DEC 2025-incidental.  CTA head and neck - Advanced emphysema with a slowly enlarging left upper lobe lung nodule now up to 2.1 cm;  Lung, biopsy, LUL :  CARCINOMA CONSISTENT WITH ADENOCARCINOMA [Dr.Kasa]. NOV 2025-  Hypermetabolic left upper lobe nodule compatible with malignancy;  Hypermetabolic left hilar and mediastinal adenopathy compatible with malignancy. Subsolid right upper lobe nodule with low-grade metabolic. Stage III- T1N2 Vs Stage ? IV [contralateral]. MRI brain- NOV 2025- NEG for metastases. S/p  Dr. Kerrin- NOt surgical candaiate. . Carbo-taxol  weekly- RT- adjuvant immunotherapy.  # proceed with cycle # 4 of planned 6-8 weeks with concurrent RT [until 06/15/2024]. Labs-CBC/chemistries were reviewed with the patient.  # right facial boil- recommend amoxicillin  500 mg TID x 5 days  # Brain fog: cut down benadryl  to 25 mg/day.- improved monitor for now.   # constipation- on mirlax prn.  stable.   # Advanced COPD based on imaging [ not symptomatic]-  stable.   # Hx of stroke [Dr.Heck; [place]]-on Brilinta  plus aspirin .  Last stroke as per patient June 2025-chronic tingling and numbness of the extremities residual from stroke- stable.   # Nutrition s/p evaluation malnutrition   # Active smoker: Patient has been trying to quit smoking.  PS; MD;Weekly-MD/labs x 3- chemo-  # DISPOSITION:  # chem today-wait for Cmp #  follow up  as per IS- -Dr.B  Above plan of care was discussed with patient/family in detail.  My contact information was given to the patient/family.     Cindy JONELLE Joe, MD 05/20/2024 9:21 AM

## 2024-05-21 ENCOUNTER — Other Ambulatory Visit: Payer: Self-pay

## 2024-05-21 ENCOUNTER — Ambulatory Visit
Admission: RE | Admit: 2024-05-21 | Discharge: 2024-05-21 | Disposition: A | Source: Ambulatory Visit | Attending: Radiation Oncology | Admitting: Radiation Oncology

## 2024-05-21 LAB — RAD ONC ARIA SESSION SUMMARY
Course Elapsed Days: 21
Plan Fractions Treated to Date: 13
Plan Prescribed Dose Per Fraction: 2 Gy
Plan Total Fractions Prescribed: 33
Plan Total Prescribed Dose: 66 Gy
Reference Point Dosage Given to Date: 26 Gy
Reference Point Session Dosage Given: 2 Gy
Session Number: 13

## 2024-05-22 ENCOUNTER — Other Ambulatory Visit: Payer: Self-pay

## 2024-05-22 ENCOUNTER — Ambulatory Visit: Admission: RE | Admit: 2024-05-22 | Source: Ambulatory Visit

## 2024-05-22 LAB — RAD ONC ARIA SESSION SUMMARY
Course Elapsed Days: 22
Plan Fractions Treated to Date: 14
Plan Prescribed Dose Per Fraction: 2 Gy
Plan Total Fractions Prescribed: 33
Plan Total Prescribed Dose: 66 Gy
Reference Point Dosage Given to Date: 28 Gy
Reference Point Session Dosage Given: 2 Gy
Session Number: 14

## 2024-05-25 ENCOUNTER — Ambulatory Visit

## 2024-05-26 ENCOUNTER — Ambulatory Visit

## 2024-05-27 ENCOUNTER — Inpatient Hospital Stay: Admitting: Internal Medicine

## 2024-05-27 ENCOUNTER — Ambulatory Visit

## 2024-05-27 ENCOUNTER — Inpatient Hospital Stay

## 2024-05-28 ENCOUNTER — Ambulatory Visit

## 2024-05-29 ENCOUNTER — Ambulatory Visit

## 2024-06-01 ENCOUNTER — Ambulatory Visit

## 2024-06-02 ENCOUNTER — Ambulatory Visit

## 2024-06-03 ENCOUNTER — Inpatient Hospital Stay: Admitting: Internal Medicine

## 2024-06-03 ENCOUNTER — Inpatient Hospital Stay

## 2024-06-03 ENCOUNTER — Ambulatory Visit

## 2024-06-04 ENCOUNTER — Ambulatory Visit

## 2024-06-05 ENCOUNTER — Ambulatory Visit

## 2024-06-08 ENCOUNTER — Ambulatory Visit

## 2024-06-09 ENCOUNTER — Ambulatory Visit

## 2024-06-10 ENCOUNTER — Inpatient Hospital Stay

## 2024-06-10 ENCOUNTER — Inpatient Hospital Stay: Admitting: Internal Medicine

## 2024-06-10 ENCOUNTER — Ambulatory Visit

## 2024-06-11 ENCOUNTER — Ambulatory Visit

## 2024-06-12 ENCOUNTER — Ambulatory Visit

## 2024-06-15 ENCOUNTER — Ambulatory Visit

## 2024-06-16 ENCOUNTER — Ambulatory Visit

## 2024-06-17 ENCOUNTER — Ambulatory Visit

## 2024-06-18 ENCOUNTER — Ambulatory Visit

## 2024-07-16 ENCOUNTER — Ambulatory Visit: Admitting: Family Medicine
